# Patient Record
Sex: Female | Born: 1945
Health system: Southern US, Community
[De-identification: ages and names within clinical notes are randomized; demographics above are authoritative.]

## PROBLEM LIST (undated history)

## (undated) DIAGNOSIS — S8290XA Unspecified fracture of unspecified lower leg, initial encounter for closed fracture: Secondary | ICD-10-CM

## (undated) DIAGNOSIS — I10 Essential (primary) hypertension: Secondary | ICD-10-CM

## (undated) DIAGNOSIS — T7840XA Allergy, unspecified, initial encounter: Secondary | ICD-10-CM

## (undated) DIAGNOSIS — N189 Chronic kidney disease, unspecified: Secondary | ICD-10-CM

## (undated) DIAGNOSIS — R197 Diarrhea, unspecified: Secondary | ICD-10-CM

## (undated) DIAGNOSIS — C4491 Basal cell carcinoma of skin, unspecified: Secondary | ICD-10-CM

## (undated) DIAGNOSIS — E785 Hyperlipidemia, unspecified: Secondary | ICD-10-CM

## (undated) DIAGNOSIS — N393 Stress incontinence (female) (male): Secondary | ICD-10-CM

## (undated) DIAGNOSIS — I440 Atrioventricular block, first degree: Secondary | ICD-10-CM

## (undated) DIAGNOSIS — J189 Pneumonia, unspecified organism: Secondary | ICD-10-CM

## (undated) DIAGNOSIS — G709 Myoneural disorder, unspecified: Secondary | ICD-10-CM

## (undated) DIAGNOSIS — H269 Unspecified cataract: Secondary | ICD-10-CM

## (undated) DIAGNOSIS — J4520 Mild intermittent asthma, uncomplicated: Secondary | ICD-10-CM

## (undated) DIAGNOSIS — K219 Gastro-esophageal reflux disease without esophagitis: Secondary | ICD-10-CM

## (undated) DIAGNOSIS — Z87442 Personal history of urinary calculi: Secondary | ICD-10-CM

## (undated) DIAGNOSIS — E119 Type 2 diabetes mellitus without complications: Secondary | ICD-10-CM

## (undated) DIAGNOSIS — C439 Malignant melanoma of skin, unspecified: Secondary | ICD-10-CM

## (undated) DIAGNOSIS — M1990 Unspecified osteoarthritis, unspecified site: Secondary | ICD-10-CM

## (undated) HISTORY — PX: REPLACEMENT TOTAL HIP W/  RESURFACING IMPLANTS: SUR1222

## (undated) HISTORY — PX: POLYPECTOMY: SHX149

## (undated) HISTORY — DX: Allergy, unspecified, initial encounter: T78.40XA

## (undated) HISTORY — DX: Unspecified osteoarthritis, unspecified site: M19.90

## (undated) HISTORY — DX: Malignant melanoma of skin, unspecified: C43.9

## (undated) HISTORY — PX: HERNIA REPAIR: SHX51

## (undated) HISTORY — PX: JOINT REPLACEMENT: SHX530

## (undated) HISTORY — DX: Unspecified cataract: H26.9

## (undated) HISTORY — DX: Hyperlipidemia, unspecified: E78.5

## (undated) HISTORY — DX: Essential (primary) hypertension: I10

## (undated) HISTORY — PX: TONSILLECTOMY: SUR1361

## (undated) HISTORY — DX: Basal cell carcinoma of skin, unspecified: C44.91

## (undated) HISTORY — PX: COLONOSCOPY: SHX174

## (undated) HISTORY — DX: Unspecified fracture of unspecified lower leg, initial encounter for closed fracture: S82.90XA

## (undated) HISTORY — DX: Diarrhea, unspecified: R19.7

## (undated) HISTORY — DX: Myoneural disorder, unspecified: G70.9

## (undated) HISTORY — PX: KNEE ARTHROSCOPY: SUR90

---

## 1984-01-08 HISTORY — PX: OTHER SURGICAL HISTORY: SHX169

## 1985-01-07 HISTORY — PX: ABDOMINAL HYSTERECTOMY: SHX81

## 1987-01-08 HISTORY — PX: CHOLECYSTECTOMY: SHX55

## 2007-01-08 HISTORY — PX: BELPHAROPTOSIS REPAIR: SHX369

## 2010-04-27 LAB — HM MAMMOGRAPHY

## 2011-04-02 LAB — HEMOGLOBIN A1C: Hgb A1c MFr Bld: 6.3 % — AB (ref 4.0–6.0)

## 2011-05-08 LAB — HM DIABETES EYE EXAM

## 2011-09-30 LAB — BASIC METABOLIC PANEL
Creatinine: 0.7 mg/dL (ref 0.5–1.1)
Glucose: 97 mg/dL
Potassium: 4.5 mmol/L (ref 3.4–5.3)

## 2011-09-30 LAB — LIPID PANEL
Cholesterol: 170 mg/dL (ref 0–200)
Triglycerides: 222 mg/dL — AB (ref 40–160)

## 2011-09-30 LAB — CBC AND DIFFERENTIAL: HCT: 44 % (ref 36–46)

## 2011-09-30 LAB — HEPATIC FUNCTION PANEL: Alkaline Phosphatase: 65 U/L (ref 25–125)

## 2012-03-12 ENCOUNTER — Encounter: Payer: Self-pay | Admitting: *Deleted

## 2012-03-12 DIAGNOSIS — J45909 Unspecified asthma, uncomplicated: Secondary | ICD-10-CM

## 2012-03-12 DIAGNOSIS — E785 Hyperlipidemia, unspecified: Secondary | ICD-10-CM

## 2012-03-12 DIAGNOSIS — E1159 Type 2 diabetes mellitus with other circulatory complications: Secondary | ICD-10-CM | POA: Insufficient documentation

## 2012-03-12 DIAGNOSIS — I10 Essential (primary) hypertension: Secondary | ICD-10-CM

## 2012-03-12 DIAGNOSIS — E119 Type 2 diabetes mellitus without complications: Secondary | ICD-10-CM

## 2012-03-12 DIAGNOSIS — M1990 Unspecified osteoarthritis, unspecified site: Secondary | ICD-10-CM | POA: Insufficient documentation

## 2012-03-12 DIAGNOSIS — E1169 Type 2 diabetes mellitus with other specified complication: Secondary | ICD-10-CM | POA: Insufficient documentation

## 2012-04-07 ENCOUNTER — Other Ambulatory Visit: Payer: Self-pay

## 2012-04-07 MED ORDER — ALBUTEROL SULFATE HFA 108 (90 BASE) MCG/ACT IN AERS: 2.0000 | INHALATION_SPRAY | Freq: Four times a day (QID) | RESPIRATORY_TRACT | Status: DC | PRN

## 2012-04-13 ENCOUNTER — Ambulatory Visit (INDEPENDENT_AMBULATORY_CARE_PROVIDER_SITE_OTHER): Payer: Medicare Other | Admitting: Nurse Practitioner

## 2012-04-13 ENCOUNTER — Encounter: Payer: Self-pay | Admitting: Nurse Practitioner

## 2012-04-13 VITALS — BP 121/72 | HR 81 | Temp 97.2°F | Ht 65.0 in | Wt 199.5 lb

## 2012-04-13 DIAGNOSIS — E559 Vitamin D deficiency, unspecified: Secondary | ICD-10-CM

## 2012-04-13 DIAGNOSIS — M25559 Pain in unspecified hip: Secondary | ICD-10-CM

## 2012-04-13 DIAGNOSIS — I10 Essential (primary) hypertension: Secondary | ICD-10-CM

## 2012-04-13 DIAGNOSIS — E119 Type 2 diabetes mellitus without complications: Secondary | ICD-10-CM | POA: Diagnosis not present

## 2012-04-13 DIAGNOSIS — M25551 Pain in right hip: Secondary | ICD-10-CM

## 2012-04-13 LAB — BASIC METABOLIC PANEL
CO2: 27 mEq/L (ref 19–32)
Calcium: 9.5 mg/dL (ref 8.4–10.5)
Creat: 0.96 mg/dL (ref 0.50–1.10)

## 2012-04-13 LAB — HEPATIC FUNCTION PANEL
Bilirubin, Direct: 0.1 mg/dL (ref 0.0–0.3)
Indirect Bilirubin: 0.3 mg/dL (ref 0.0–0.9)
Total Protein: 6.7 g/dL (ref 6.0–8.3)

## 2012-04-13 LAB — POCT GLYCOSYLATED HEMOGLOBIN (HGB A1C): Hemoglobin A1C: 6.3

## 2012-04-13 MED ORDER — SULFAMETHOXAZOLE-TRIMETHOPRIM 800-160 MG PO TABS: 1.0000 | ORAL_TABLET | Freq: Two times a day (BID) | ORAL | Status: DC

## 2012-04-13 MED ORDER — DICLOFENAC SODIUM 75 MG PO TBEC: 75.0000 mg | DELAYED_RELEASE_TABLET | Freq: Two times a day (BID) | ORAL | Status: DC

## 2012-04-13 NOTE — Patient Instructions (Signed)
1. Diabetes Continue low carb diet - HgB A1c - Basic Metabolic Panel  2. HTN (hypertension) Low Na+ diet - Hepatic Function Panel - NMR Lipoprofile with Lipids  3. Unspecified vitamin D deficiency Continue Vitamin D OTC as discussed  4. Right hip pain Keep F/U with ortho. - diclofenac (VOLTAREN) 75 MG EC tablet; Take 1 tablet (75 mg total) by mouth 2 (two) times daily.  Dispense: 60 tablet; Refill: 3  5. Hydradenitis Bactrim as needed Shave downward and not upward Deodorant not antiperspirant  Mary-Margaret Daphine Deutscher, FNP

## 2012-04-13 NOTE — Progress Notes (Signed)
Subjective:    Patient ID: Rita Payne, female    DOB: 01/12/1945, 67 y.o.   MRN: [id number]  Hyperlipidemia This is a chronic problem. The current episode started more than 1 year ago. The problem is controlled. Recent lipid tests were reviewed and are normal. Exacerbating diseases include diabetes and obesity. There are no known factors aggravating her hyperlipidemia. Pertinent negatives include no chest pain, focal sensory loss, leg pain, myalgias or shortness of breath. Current antihyperlipidemic treatment includes statins. The current treatment provides significant improvement of lipids. There are no compliance problems.  Risk factors for coronary artery disease include diabetes mellitus and hypertension.  Hypertension This is a chronic problem. The current episode started more than 1 year ago. The problem is unchanged. The problem is controlled. Pertinent negatives include no blurred vision, chest pain, headaches, palpitations, peripheral edema or shortness of breath. There are no associated agents to hypertension. Risk factors for coronary artery disease include diabetes mellitus and dyslipidemia. Past treatments include angiotensin blockers. The current treatment provides significant improvement. There are no compliance problems.   Asthma There is no chest tightness, cough or shortness of breath. This is a chronic problem. The current episode started more than 1 year ago. Pertinent negatives include no chest pain, headaches, myalgias, rhinorrhea or weight loss. Her symptoms are aggravated by nothing. Her symptoms are alleviated by steroid inhaler (Only uses albuterol 1-2 X per month). She reports complete improvement on treatment. Her past medical history is significant for asthma.  Diabetes She presents for her follow-up diabetic visit. She has type 2 diabetes mellitus. No MedicAlert identification noted. Her disease course has been stable. There are no hypoglycemic associated symptoms. Pertinent  negatives for hypoglycemia include no headaches. Pertinent negatives for diabetes include no blurred vision, no chest pain, no polydipsia, no polyphagia, no polyuria, no visual change, no weakness and no weight loss. There are no hypoglycemic complications. Symptoms are stable. There are no diabetic complications. Risk factors for coronary artery disease include dyslipidemia, hypertension, obesity and post-menopausal. Current diabetic treatment includes diet. She is compliant with treatment all of the time. Her weight is stable. She is following a diabetic diet. When asked about meal planning, she reported none. She has not had a previous visit with a dietician. Exercise: Not able to exercise due to hip pain. Suppose to havehip replacement in JUne of this year. Her breakfast blood glucose is taken between 8-9 am. Her breakfast blood glucose range is generally 110-130 mg/dl. An ACE inhibitor/angiotensin II receptor blocker is contraindicated. She does not see a podiatrist.Eye exam is current (May 2013).  Hydradenitis This is a chronic problem. She keeps antibiotic on hand for flare ups. Is currently out. Last flare-up was 3 months ago.    Review of Systems  Constitutional: Negative for weight loss.  HENT: Negative for rhinorrhea.   Eyes: Negative for blurred vision.  Respiratory: Negative for cough and shortness of breath.   Cardiovascular: Negative for chest pain and palpitations.  Endocrine: Negative for polydipsia, polyphagia and polyuria.  Musculoskeletal: Negative for myalgias.  Neurological: Negative for weakness and headaches.  All other systems reviewed and are negative.       Objective:   Physical Exam  Constitutional: She is oriented to person, place, and time. She appears well-developed and well-nourished.  HENT:  Nose: Nose normal.  Mouth/Throat: Oropharynx is clear and moist.  Eyes: EOM are normal.  Neck: Trachea normal, normal range of motion and full passive range of motion  without pain. Neck supple. No  JVD present. Carotid bruit is not present. No thyromegaly present.  Cardiovascular: Normal rate, regular rhythm, normal heart sounds and intact distal pulses.  Exam reveals no gallop and no friction rub.   No murmur heard. Pulmonary/Chest: Effort normal and breath sounds normal.  Abdominal: Soft. Bowel sounds are normal. She exhibits no distension and no mass. There is no tenderness.  Musculoskeletal: Normal range of motion.  Lymphadenopathy:    She has no cervical adenopathy.  Neurological: She is alert and oriented to person, place, and time. She has normal reflexes.  (+) 4/4 monofilament bil feet  Skin: Skin is warm and dry.  No callus formation bil feet  Psychiatric: She has a normal mood and affect. Her behavior is normal. Judgment and thought content normal.   BP 121/72  Pulse 81  Temp(Src) 97.2 F (36.2 C) (Oral)  Ht 5\' 5"  (1.651 m)  Wt 199 lb 8 oz (90.493 kg)  BMI 33.2 kg/m2  LMP 04/14/1982 Results for orders placed in visit on 04/13/12  POCT GLYCOSYLATED HEMOGLOBIN (HGB A1C)      Result Value Range   Hemoglobin A1C 6.3     visit       Assessment & Plan:  1. Diabetes Continue low carb diet - HgB A1c - Basic Metabolic Panel  2. HTN (hypertension) Low Na+ diet - Hepatic Function Panel - NMR Lipoprofile with Lipids  3. Unspecified vitamin D deficiency Continue Vitamin D OTC as discussed  4. Right hip pain Keep F/U with ortho. - diclofenac (VOLTAREN) 75 MG EC tablet; Take 1 tablet (75 mg total) by mouth 2 (two) times daily.  Dispense: 60 tablet; Refill: 3  5. Hydradenitis Bactrim as needed Shave downward and not upward Deodorant not antiperspirant  Mary-Margaret Daphine Deutscher, FNP

## 2012-04-13 NOTE — Addendum Note (Signed)
Addended by: Bennie Pierini on: 04/13/2012 09:20 AM   Modules accepted: Orders

## 2012-04-14 ENCOUNTER — Telehealth: Payer: Self-pay | Admitting: *Deleted

## 2012-04-14 LAB — NMR LIPOPROFILE WITH LIPIDS
HDL Particle Number: 29.4 umol/L — ABNORMAL LOW (ref >=30.5)
HDL Size: 8.8 nm — ABNORMAL LOW (ref >=9.2)
HDL-C: 37 mg/dL — ABNORMAL LOW (ref >=40)
LDL (calc): 85 mg/dL (ref <100)
LDL Size: 19.8 nm — ABNORMAL LOW (ref >20.5)
LP-IR Score: 67 — ABNORMAL HIGH (ref <=45)

## 2012-04-14 NOTE — Telephone Encounter (Signed)
Message copied by Rami Budhu H on Tue Apr 14, 2012  3:30 PM ------      Message from: MARTIN, MARY-MARGARET      Created: Tue Apr 14, 2012  1:52 PM       All labs look ok. Mildly elevated LDL particle numbers. Continue all meds and recheck in 3 months ------ 

## 2012-04-14 NOTE — Telephone Encounter (Signed)
Message copied by Magdalene River on Tue Apr 14, 2012  3:29 PM ------      Message from: Bennie Pierini      Created: Tue Apr 14, 2012  1:52 PM       All labs look ok. Mildly elevated LDL particle numbers. Continue all meds and recheck in 3 months ------

## 2012-04-14 NOTE — Telephone Encounter (Signed)
Message copied by Magdalene River on Tue Apr 14, 2012  3:30 PM ------      Message from: Bennie Pierini      Created: Tue Apr 14, 2012  1:52 PM       All labs look ok. Mildly elevated LDL particle numbers. Continue all meds and recheck in 3 months ------

## 2012-04-15 ENCOUNTER — Telehealth: Payer: Self-pay | Admitting: Nurse Practitioner

## 2012-04-15 NOTE — Telephone Encounter (Signed)
Pt aware of results 

## 2012-05-20 ENCOUNTER — Other Ambulatory Visit: Payer: Self-pay

## 2012-05-20 DIAGNOSIS — M25551 Pain in right hip: Secondary | ICD-10-CM

## 2012-05-20 MED ORDER — ROSUVASTATIN CALCIUM 40 MG PO TABS: 40.0000 mg | ORAL_TABLET | Freq: Every day | ORAL | Status: DC

## 2012-05-20 MED ORDER — ALBUTEROL SULFATE HFA 108 (90 BASE) MCG/ACT IN AERS: 2.0000 | INHALATION_SPRAY | Freq: Four times a day (QID) | RESPIRATORY_TRACT | Status: DC | PRN

## 2012-05-20 MED ORDER — DICLOFENAC SODIUM 75 MG PO TBEC: 75.0000 mg | DELAYED_RELEASE_TABLET | Freq: Two times a day (BID) | ORAL | Status: DC

## 2012-05-20 MED ORDER — GLUCOSE BLOOD VI STRP: ORAL_STRIP | Status: DC

## 2012-05-20 MED ORDER — TELMISARTAN-HCTZ 80-25 MG PO TABS: 1.0000 | ORAL_TABLET | Freq: Every day | ORAL | Status: DC

## 2012-05-20 NOTE — Telephone Encounter (Signed)
Last seen 04/13/12 with labs  Print and have nurse call patient to pick up for mail order

## 2012-05-20 NOTE — Telephone Encounter (Signed)
rx ready for pickup 

## 2012-05-21 NOTE — Telephone Encounter (Signed)
Pt aware rx's up front and she will have to mail them in

## 2012-05-25 ENCOUNTER — Other Ambulatory Visit: Payer: Self-pay | Admitting: *Deleted

## 2012-05-25 MED ORDER — FLUTICASONE-SALMETEROL 250-50 MCG/DOSE IN AEPB: 1.0000 | INHALATION_SPRAY | Freq: Two times a day (BID) | RESPIRATORY_TRACT | Status: DC

## 2012-07-06 ENCOUNTER — Telehealth: Payer: Self-pay | Admitting: Nurse Practitioner

## 2012-07-06 NOTE — Telephone Encounter (Signed)
Patient advised to go to ER due to the pain radiating to her jaw for further examination to make sure it is not coming from her heart.

## 2012-07-14 ENCOUNTER — Encounter: Payer: Self-pay | Admitting: Nurse Practitioner

## 2012-07-14 ENCOUNTER — Ambulatory Visit (INDEPENDENT_AMBULATORY_CARE_PROVIDER_SITE_OTHER): Payer: Medicare Other | Admitting: Nurse Practitioner

## 2012-07-14 VITALS — BP 113/75 | HR 86 | Temp 97.0°F | Ht 65.0 in | Wt 192.0 lb

## 2012-07-14 DIAGNOSIS — I10 Essential (primary) hypertension: Secondary | ICD-10-CM | POA: Diagnosis not present

## 2012-07-14 DIAGNOSIS — M25551 Pain in right hip: Secondary | ICD-10-CM

## 2012-07-14 DIAGNOSIS — E119 Type 2 diabetes mellitus without complications: Secondary | ICD-10-CM

## 2012-07-14 DIAGNOSIS — E785 Hyperlipidemia, unspecified: Secondary | ICD-10-CM

## 2012-07-14 DIAGNOSIS — M25559 Pain in unspecified hip: Secondary | ICD-10-CM

## 2012-07-14 DIAGNOSIS — J45909 Unspecified asthma, uncomplicated: Secondary | ICD-10-CM

## 2012-07-14 DIAGNOSIS — J4520 Mild intermittent asthma, uncomplicated: Secondary | ICD-10-CM

## 2012-07-14 LAB — POCT UA - MICROALBUMIN: Microalbumin Ur, POC: NEGATIVE mg/L

## 2012-07-14 LAB — COMPLETE METABOLIC PANEL WITH GFR
Albumin: 4.1 g/dL (ref 3.5–5.2)
CO2: 26 mEq/L (ref 19–32)
Calcium: 9.5 mg/dL (ref 8.4–10.5)
GFR, Est African American: 72 mL/min
GFR, Est Non African American: 62 mL/min
Glucose, Bld: 95 mg/dL (ref 70–99)
Potassium: 4.2 mEq/L (ref 3.5–5.3)
Sodium: 139 mEq/L (ref 135–145)
Total Protein: 6.9 g/dL (ref 6.0–8.3)

## 2012-07-14 MED ORDER — ROSUVASTATIN CALCIUM 40 MG PO TABS: 40.0000 mg | ORAL_TABLET | Freq: Every day | ORAL | Status: DC

## 2012-07-14 MED ORDER — DICLOFENAC SODIUM 1 % TD GEL: 4.0000 g | Freq: Four times a day (QID) | TRANSDERMAL | Status: DC

## 2012-07-14 MED ORDER — FLUTICASONE-SALMETEROL 250-50 MCG/DOSE IN AEPB: 1.0000 | INHALATION_SPRAY | Freq: Two times a day (BID) | RESPIRATORY_TRACT | Status: DC

## 2012-07-14 MED ORDER — PNEUMOCOCCAL VAC POLYVALENT 25 MCG/0.5ML IJ INJ: 0.5000 mL | INJECTION | INTRAMUSCULAR | Status: AC

## 2012-07-14 MED ORDER — ALBUTEROL SULFATE HFA 108 (90 BASE) MCG/ACT IN AERS: 2.0000 | INHALATION_SPRAY | Freq: Four times a day (QID) | RESPIRATORY_TRACT | Status: DC | PRN

## 2012-07-14 MED ORDER — DICLOFENAC SODIUM 75 MG PO TBEC: 75.0000 mg | DELAYED_RELEASE_TABLET | Freq: Two times a day (BID) | ORAL | Status: DC

## 2012-07-14 MED ORDER — TELMISARTAN-HCTZ 80-25 MG PO TABS: 1.0000 | ORAL_TABLET | Freq: Every day | ORAL | Status: DC

## 2012-07-14 NOTE — Addendum Note (Signed)
Addended by: Bernita Buffy on: 07/14/2012 10:25 AM   Modules accepted: Orders

## 2012-07-14 NOTE — Patient Instructions (Addendum)
Smoking Cessation Quitting smoking is important to your health and has many advantages. However, it is not always easy to quit since nicotine is a very addictive drug. Often times, people try 3 times or more before being able to quit. This document explains the best ways for you to prepare to quit smoking. Quitting takes hard work and a lot of effort, but you can do it. ADVANTAGES OF QUITTING SMOKING  You will live longer, feel better, and live better.  Your body will feel the impact of quitting smoking almost immediately.  Within 20 minutes, blood pressure decreases. Your pulse returns to its normal level.  After 8 hours, carbon monoxide levels in the blood return to normal. Your oxygen level increases.  After 24 hours, the chance of having a heart attack starts to decrease. Your breath, hair, and body stop smelling like smoke.  After 48 hours, damaged nerve endings begin to recover. Your sense of taste and smell improve.  After 72 hours, the body is virtually free of nicotine. Your bronchial tubes relax and breathing becomes easier.  After 2 to 12 weeks, lungs can hold more air. Exercise becomes easier and circulation improves.  The risk of having a heart attack, stroke, cancer, or lung disease is greatly reduced.  After 1 year, the risk of coronary heart disease is cut in half.  After 5 years, the risk of stroke falls to the same as a nonsmoker.  After 10 years, the risk of lung cancer is cut in half and the risk of other cancers decreases significantly.  After 15 years, the risk of coronary heart disease drops, usually to the level of a nonsmoker.  If you are pregnant, quitting smoking will improve your chances of having a healthy baby.  The people you live with, especially any children, will be healthier.  You will have extra money to spend on things other than cigarettes. QUESTIONS TO THINK ABOUT BEFORE ATTEMPTING TO QUIT You may want to talk about your answers with your  caregiver.  Why do you want to quit?  If you tried to quit in the past, what helped and what did not?  What will be the most difficult situations for you after you quit? How will you plan to handle them?  Who can help you through the tough times? Your family? Friends? A caregiver?  What pleasures do you get from smoking? What ways can you still get pleasure if you quit? Here are some questions to ask your caregiver:  How can you help me to be successful at quitting?  What medicine do you think would be best for me and how should I take it?  What should I do if I need more help?  What is smoking withdrawal like? How can I get information on withdrawal? GET READY  Set a quit date.  Change your environment by getting rid of all cigarettes, ashtrays, matches, and lighters in your home, car, or work. Do not let people smoke in your home.  Review your past attempts to quit. Think about what worked and what did not. GET SUPPORT AND ENCOURAGEMENT You have a better chance of being successful if you have help. You can get support in many ways.  Tell your family, friends, and co-workers that you are going to quit and need their support. Ask them not to smoke around you.  Get individual, group, or telephone counseling and support. Programs are available at local hospitals and health centers. Call your local health department for   information about programs in your area.  Spiritual beliefs and practices may help some smokers quit.  Download a "quit meter" on your computer to keep track of quit statistics, such as how long you have gone without smoking, cigarettes not smoked, and money saved.  Get a self-help book about quitting smoking and staying off of tobacco. LEARN NEW SKILLS AND BEHAVIORS  Distract yourself from urges to smoke. Talk to someone, go for a walk, or occupy your time with a task.  Change your normal routine. Take a different route to work. Drink tea instead of coffee.  Eat breakfast in a different place.  Reduce your stress. Take a hot bath, exercise, or read a book.  Plan something enjoyable to do every day. Reward yourself for not smoking.  Explore interactive web-based programs that specialize in helping you quit. GET MEDICINE AND USE IT CORRECTLY Medicines can help you stop smoking and decrease the urge to smoke. Combining medicine with the above behavioral methods and support can greatly increase your chances of successfully quitting smoking.  Nicotine replacement therapy helps deliver nicotine to your body without the negative effects and risks of smoking. Nicotine replacement therapy includes nicotine gum, lozenges, inhalers, nasal sprays, and skin patches. Some may be available over-the-counter and others require a prescription.  Antidepressant medicine helps people abstain from smoking, but how this works is unknown. This medicine is available by prescription.  Nicotinic receptor partial agonist medicine simulates the effect of nicotine in your brain. This medicine is available by prescription. Ask your caregiver for advice about which medicines to use and how to use them based on your health history. Your caregiver will tell you what side effects to look out for if you choose to be on a medicine or therapy. Carefully read the information on the package. Do not use any other product containing nicotine while using a nicotine replacement product.  RELAPSE OR DIFFICULT SITUATIONS Most relapses occur within the first 3 months after quitting. Do not be discouraged if you start smoking again. Remember, most people try several times before finally quitting. You may have symptoms of withdrawal because your body is used to nicotine. You may crave cigarettes, be irritable, feel very hungry, cough often, get headaches, or have difficulty concentrating. The withdrawal symptoms are only temporary. They are strongest when you first quit, but they will go away within  10 14 days. To reduce the chances of relapse, try to:  Avoid drinking alcohol. Drinking lowers your chances of successfully quitting.  Reduce the amount of caffeine you consume. Once you quit smoking, the amount of caffeine in your body increases and can give you symptoms, such as a rapid heartbeat, sweating, and anxiety.  Avoid smokers because they can make you want to smoke.  Do not let weight gain distract you. Many smokers will gain weight when they quit, usually less than 10 pounds. Eat a healthy diet and stay active. You can always lose the weight gained after you quit.  Find ways to improve your mood other than smoking. FOR MORE INFORMATION  www.smokefree.gov  Document Released: 12/18/2000 Document Revised: 06/25/2011 Document Reviewed: 04/04/2011 [employer] Patient Information 2014 [employer].  

## 2012-07-14 NOTE — Progress Notes (Signed)
Subjective:    Patient ID: Rita Payne, female    DOB: 04-Nov-1945, 67 y.o.   MRN: [id number]  Hyperlipidemia This is a chronic problem. The current episode started more than 1 year ago. The problem is uncontrolled. Recent lipid tests were reviewed and are high. Exacerbating diseases include diabetes. She has no history of hypothyroidism. Factors aggravating her hyperlipidemia include smoking. Pertinent negatives include no myalgias or shortness of breath. Current antihyperlipidemic treatment includes statins. The current treatment provides mild improvement of lipids. Risk factors for coronary artery disease include post-menopausal, hypertension, diabetes mellitus and dyslipidemia.  Hypertension This is a chronic problem. The current episode started more than 1 year ago. The problem has been resolved since onset. The problem is controlled. Pertinent negatives include no anxiety, peripheral edema or shortness of breath. Risk factors for coronary artery disease include diabetes mellitus, family history, dyslipidemia, post-menopausal state and smoking/tobacco exposure. Past treatments include diuretics and angiotensin blockers. The current treatment provides moderate improvement. There is no history of a thyroid problem.  Diabetes She presents for her follow-up diabetic visit. She has type 2 diabetes mellitus. There are no hypoglycemic associated symptoms. Pertinent negatives for diabetes include no foot paresthesias, no foot ulcerations and no visual change. Symptoms are stable. Risk factors for coronary artery disease include diabetes mellitus, dyslipidemia, family history, hypertension and tobacco exposure. Current diabetic treatment includes diet. She is compliant with treatment all of the time. Her weight is stable. She is following a diabetic diet. She never participates in exercise. Her breakfast blood glucose range is generally 90-110 mg/dl. An ACE inhibitor/angiotensin II receptor blocker is not being  taken. She does not see a podiatrist.Eye exam is current (A year ago).  Asthma There is no chest tightness, cough, difficulty breathing or shortness of breath. This is a chronic problem. The current episode started more than 1 year ago. The problem occurs intermittently. The problem has been waxing and waning. Pertinent negatives include no myalgias. Her symptoms are aggravated by change in weather, exercise and pollen. Her symptoms are alleviated by steroid inhaler. She reports significant improvement on treatment. Risk factors for lung disease include smoking/tobacco exposure. Her past medical history is significant for asthma.      Review of Systems  Respiratory: Negative for cough and shortness of breath.   Musculoskeletal: Negative for myalgias.  All other systems reviewed and are negative.       Objective:   Physical Exam  Constitutional: She is oriented to person, place, and time. She appears well-developed and well-nourished.  HENT:  Head: Normocephalic.  Right Ear: External ear normal.  Left Ear: External ear normal.  Eyes: Pupils are equal, round, and reactive to light.  Neck: Normal range of motion. Neck supple. No thyromegaly present.  Cardiovascular: Normal rate, regular rhythm, normal heart sounds and intact distal pulses.   Pulmonary/Chest: Effort normal and breath sounds normal.  Abdominal: Soft. Bowel sounds are normal. There is no tenderness. There is no rebound.  Musculoskeletal: She exhibits no edema.  Decreased ROM Right hip due to pain. Has consult with ortho July 28  Neurological: She is alert and oriented to person, place, and time. She has normal reflexes.  Skin: Skin is warm and dry.  Psychiatric: She has a normal mood and affect. Her behavior is normal. Judgment and thought content normal.   BP 113/75  Pulse 86  Temp(Src) 97 F (36.1 C) (Oral)  Ht 5\' 5"  (1.651 m)  Wt 192 lb (87.091 kg)  BMI 31.95 kg/m2  LMP  04/14/1982 Results for orders placed in  visit on 07/14/12  POCT GLYCOSYLATED HEMOGLOBIN (HGB A1C)      Result Value Range   Hemoglobin A1C 5.9%            Assessment & Plan:   1. Diabetes mellitus, controlled   2. Hyperlipidemia   3. Hypertension   4. Right hip pain   5. Asthma, chronic, mild intermittent, uncomplicated    Orders Placed This Encounter  Procedures  . COMPLETE METABOLIC PANEL WITH GFR  . NMR Lipoprofile with Lipids  . POCT glycosylated hemoglobin (Hb A1C)   Meds ordered this encounter  Medications  . albuterol (PROVENTIL HFA;VENTOLIN HFA) 108 (90 BASE) MCG/ACT inhaler    Sig: Inhale 2 puffs into the lungs every 6 (six) hours as needed for wheezing.    Dispense:  3 Inhaler    Refill:  1    Order Specific Question:  Supervising Provider    Answer:  Ernestina Penna [[id number]]  . diclofenac (VOLTAREN) 75 MG EC tablet    Sig: Take 1 tablet (75 mg total) by mouth 2 (two) times daily.    Dispense:  180 tablet    Refill:  1    Order Specific Question:  Supervising Provider    Answer:  Ernestina Penna [[id number]]  . diclofenac sodium (VOLTAREN) 1 % GEL    Sig: Apply 4 g topically 4 (four) times daily.    Dispense:  500 g    Refill:  2    Order Specific Question:  Supervising Provider    Answer:  Ernestina Penna [[id number]]  . Fluticasone-Salmeterol (ADVAIR) 250-50 MCG/DOSE AEPB    Sig: Inhale 1 puff into the lungs every 12 (twelve) hours.    Dispense:  180 each    Refill:  1    Order Specific Question:  Supervising Provider    Answer:  Ernestina Penna [[id number]]  . rosuvastatin (CRESTOR) 40 MG tablet    Sig: Take 1 tablet (40 mg total) by mouth daily.    Dispense:  90 tablet    Refill:  1    Order Specific Question:  Supervising Provider    Answer:  Ernestina Penna [[id number]]  . telmisartan-hydrochlorothiazide (MICARDIS HCT) 80-25 MG per tablet    Sig: Take 1 tablet by mouth daily.    Dispense:  90 tablet    Refill:  1    Order Specific Question:  Supervising Provider    Answer:  Deborra Medina    Continue current meds Labs pending Diet and exercsie encouraged Follow up in 3 months  Mary-Margaret Daphine Deutscher, FNP

## 2012-07-14 NOTE — Addendum Note (Signed)
Addended by: Bennie Pierini on: 07/14/2012 10:17 AM   Modules accepted: Orders

## 2012-07-15 LAB — NMR LIPOPROFILE WITH LIPIDS
HDL Size: 8.6 nm — ABNORMAL LOW (ref >=9.2)
LDL Size: 19.6 nm — ABNORMAL LOW (ref >20.5)
Large HDL-P: <1.3 umol/L — ABNORMAL LOW (ref >=4.8)
Large VLDL-P: 10.4 nmol/L — ABNORMAL HIGH (ref <=2.7)
Small LDL Particle Number: >2300 nmol/L — ABNORMAL HIGH (ref <=527)

## 2012-08-03 DIAGNOSIS — M169 Osteoarthritis of hip, unspecified: Secondary | ICD-10-CM | POA: Diagnosis not present

## 2012-08-04 ENCOUNTER — Telehealth: Payer: Self-pay | Admitting: Nurse Practitioner

## 2012-08-11 ENCOUNTER — Encounter (HOSPITAL_COMMUNITY): Payer: Self-pay | Admitting: Pharmacy Technician

## 2012-08-12 ENCOUNTER — Telehealth: Payer: Self-pay | Admitting: Nurse Practitioner

## 2012-08-13 ENCOUNTER — Telehealth: Payer: Self-pay | Admitting: Nurse Practitioner

## 2012-08-13 NOTE — Telephone Encounter (Signed)
Call is being handled in another encounter by triage for appt

## 2012-08-13 NOTE — Telephone Encounter (Signed)
Call is being handled by triage in another encounter

## 2012-08-14 ENCOUNTER — Encounter (HOSPITAL_COMMUNITY): Payer: Self-pay

## 2012-08-14 ENCOUNTER — Ambulatory Visit (HOSPITAL_COMMUNITY)
Admission: RE | Admit: 2012-08-14 | Discharge: 2012-08-14 | Disposition: A | Discharge status: Home / Self Care | Payer: Medicare Other | Source: Ambulatory Visit | Attending: Orthopedic Surgery | Admitting: Orthopedic Surgery

## 2012-08-14 ENCOUNTER — Encounter (HOSPITAL_COMMUNITY)
Admission: RE | Admit: 2012-08-14 | Discharge: 2012-08-14 | Disposition: A | Discharge status: Home / Self Care | Payer: Medicare Other | Source: Ambulatory Visit | Attending: Orthopedic Surgery | Admitting: Orthopedic Surgery

## 2012-08-14 DIAGNOSIS — Z01818 Encounter for other preprocedural examination: Secondary | ICD-10-CM | POA: Insufficient documentation

## 2012-08-14 DIAGNOSIS — I10 Essential (primary) hypertension: Secondary | ICD-10-CM | POA: Diagnosis not present

## 2012-08-14 DIAGNOSIS — Z01812 Encounter for preprocedural laboratory examination: Secondary | ICD-10-CM | POA: Diagnosis not present

## 2012-08-14 DIAGNOSIS — M169 Osteoarthritis of hip, unspecified: Secondary | ICD-10-CM | POA: Diagnosis not present

## 2012-08-14 HISTORY — DX: Pneumonia, unspecified organism: J18.9

## 2012-08-14 LAB — BASIC METABOLIC PANEL
CO2: 27 mEq/L (ref 19–32)
Chloride: 98 mEq/L (ref 96–112)
GFR calc Af Amer: 83 mL/min — ABNORMAL LOW (ref >90)
Potassium: 3.5 mEq/L (ref 3.5–5.1)
Sodium: 138 mEq/L (ref 135–145)

## 2012-08-14 LAB — URINALYSIS, ROUTINE W REFLEX MICROSCOPIC
Glucose, UA: NEGATIVE mg/dL
Nitrite: NEGATIVE
Protein, ur: NEGATIVE mg/dL
pH: 5.5 (ref 5.0–8.0)

## 2012-08-14 LAB — PROTIME-INR: INR: 0.92 (ref 0.00–1.49)

## 2012-08-14 LAB — CBC
HCT: 39.8 % (ref 36.0–46.0)
MCV: 92.6 fL (ref 78.0–100.0)
RBC: 4.3 MIL/uL (ref 3.87–5.11)
WBC: 9.9 10*3/uL (ref 4.0–10.5)

## 2012-08-14 LAB — URINE MICROSCOPIC-ADD ON

## 2012-08-14 LAB — SURGICAL PCR SCREEN: MRSA, PCR: NEGATIVE

## 2012-08-14 LAB — APTT: aPTT: 27 seconds (ref 24–37)

## 2012-08-14 NOTE — Telephone Encounter (Signed)
appt given for 8/14 with Paulene Floor

## 2012-08-14 NOTE — Patient Instructions (Addendum)
Wilmetta Speiser  08/14/2012   Your procedure is scheduled on:  08/25/12               Surgery 1240-210pm  Report to [facility] at     0940 AM.  Call this number if you have problems the morning of surgery: [phone]   Remember:   Do not eat food or drink liquids after midnight.   Take these medicines the morning of surgery with A SIP OF WATER:    Do not wear jewelry, make-up or nail polish.  Do not wear lotions, powders, or perfumes.   Do not shave 48 hours prior to surgery.   Do not bring valuables to the hospital.  Contacts, dentures or bridgework may not be worn into surgery.  Leave suitcase in the car. After surgery it may be brought to your room.  For patients admitted to the hospital, checkout time is 11:00 AM the day of  discharge.      SEE CHG INSTRUCTION SHEET    Please read over the following fact sheets that you were given: MRSA Information, coughing and deep breathing exercises, leg exercises, Blood Transfusion Fact sheet, Incentive Spirometry Fact sheet               Failure to comply with these instructions may result in cancellation of your surgery.                Patient Signature ____________________________              Nurse Signature _____________________________  Amada Kingfisher  08/17/2012   Your procedure is scheduled on:    Report to [facility] at  AM.  Call this number if you have problems the morning of surgery: [phone]   Remember:   Do not eat food or drink liquids after midnight.   Take these medicines the morning of surgery with A SIP OF WATER:    Do not wear jewelry, make-up or nail polish.  Do not wear lotions, powders, or perfumes. You may wear deodorant.  Do not shave 48 hours prior to surgery. Men may shave face and neck.  Do not bring valuables to the hospital.  Contacts, dentures or bridgework may not be worn into surgery.  Leave suitcase in the car. After surgery it may be brought to your room.  For  patients admitted to the hospital, checkout time is 11:00 AM the day of  discharge.   Patients discharged the day of surgery will not be allowed to drive  home.  Name and phone number of your driver:   SEE CHG INSTRUCTION SHEET    Please read over the following fact sheets that you were given: MRSA Information, coughing and deep breathing exercises, leg exercises               Failure to comply with these instructions may result in cancellation of your surgery.                Patient Signature ____________________________              Nurse Signature _____________________________

## 2012-08-15 LAB — URINE CULTURE: Colony Count: 40000

## 2012-08-16 NOTE — H&P (Signed)
TOTAL HIP ADMISSION H&P  Patient is admitted for right total hip arthroplasty, anterior approach.  Subjective:  Chief Complaint: Right hip OA / pain  HPI: Rita Payne, 67 y.o. female, has a history of pain and functional disability in the right hip(s) due to trauma and patient has failed non-surgical conservative treatments for greater than 12 weeks to include NSAID's and/or analgesics, use of assistive devices and activity modification.  Onset of symptoms was gradual starting 5 years ago with rapidlly worsening course since that time.The patient noted no past surgery on the right hip(s).  Patient currently rates pain in the right hip at 10 out of 10 with activity. Patient has night pain, worsening of pain with activity and weight bearing, trendelenberg gait, pain that interfers with activities of daily living and pain with passive range of motion. Patient has evidence of periarticular osteophytes and joint space narrowing by imaging studies. This condition presents safety issues increasing the risk of falls.  There is no current active infection.  Risks, benefits and expectations were discussed with the patient. Patient understand the risks, benefits and expectations and wishes to proceed with surgery.   D/C Plans:   Home with HHPT  Post-op Meds:   Rx given for ASA, Robaxin, Celebrex, Iron, Colace and MiraLax  Tranexamic Acid:   To be given  Decadron:    Not to be given - DM  FYI:    ASA post-op   Patient Active Problem List   Diagnosis Date Noted  . Diabetes 03/12/2012  . Asthma, chronic 03/12/2012  . Essential hypertension, benign 03/12/2012  . Other and unspecified hyperlipidemia 03/12/2012  . Arthritis 03/12/2012   Past Medical History  Diagnosis Date  . Asthma   . Hypertension   . Hyperlipidemia   . Arthritis   . Pneumonia     hx of   . Diabetes mellitus without complication     type II not on meds 5.9 AIC on 07/14/12   . Sleep apnea     mild sleep apnea 10 years ago not on  CPAP   . Kidney stones     hx of   . UTI (urinary tract infection)     hx of     Past Surgical History  Procedure Laterality Date  . Tonsillectomy    . Abdominal hysterectomy    . Cholecystectomy    . Knee arthroscopy      bilateral knees  . Hernia repair    . Kidney tumor removal       1986  . Belpharoptosis repair      bilateral - 2009      Allergies  Allergen Reactions  . Adhesive (Tape)     blisters  . Iodine Other (See Comments)    Iodine that is applied to skin causes blisters   . Ivp Dye (Iodinated Diagnostic Agents)     unknown  . Pyridium (Phenazopyridine Hcl)     unknown  . Shellfish Allergy     unknown  . Vasotec (Enalapril)     unknown    History  Substance Use Topics  . Smoking status: Current Every Day Smoker -- 0.25 packs/day for 6 years    Types: Cigarettes  . Smokeless tobacco: Never Used  . Alcohol Use: No    Family History  Problem Relation Age of Onset  . Diabetes Mother   . Heart disease Mother   . Stroke Mother   . Heart disease Father   . Stroke Father   .  Vision loss Brother   . Diabetes Sister   . Diabetes Sister   . Stroke Sister   . Heart disease Brother      Review of Systems  Constitutional: Negative.   HENT: Negative.   Eyes: Negative.   Respiratory: Negative.   Cardiovascular: Negative.   Musculoskeletal: Positive for myalgias, back pain and joint pain.  Skin: Negative.   Endo/Heme/Allergies: Positive for environmental allergies.    Objective:  Physical Exam  Constitutional: She is oriented to person, place, and time. She appears well-developed and well-nourished.  HENT:  Head: Normocephalic and atraumatic.  Mouth/Throat: Oropharynx is clear and moist. She has dentures.  Eyes: Pupils are equal, round, and reactive to light.  Neck: Neck supple. No JVD present. No tracheal deviation present. No thyromegaly present.  Cardiovascular: Normal rate, regular rhythm, normal heart sounds and intact distal pulses.    Respiratory: Effort normal and breath sounds normal. No stridor. No respiratory distress. She has no wheezes.  GI: Soft. There is no tenderness. There is no guarding.  Musculoskeletal:       Right hip: She exhibits decreased range of motion, decreased strength, tenderness and bony tenderness. She exhibits no swelling, no deformity and no laceration.  Lymphadenopathy:    She has no cervical adenopathy.  Neurological: She is alert and oriented to person, place, and time.  Skin: Skin is warm and dry.  Psychiatric: She has a normal mood and affect.     Labs:  Estimated body mass index is 31.95 kg/(m^2) as calculated from the following:   Height as of 07/14/12: 5\' 5"  (1.651 m).   Weight as of 07/14/12: 87.091 kg (192 lb).   Imaging Review Plain radiographs demonstrate severe degenerative joint disease of the right hip(s). The bone quality appears to be good for age and reported activity level.  Assessment/Plan:  End stage arthritis, right hip(s)  The patient history, physical examination, clinical judgement of the provider and imaging studies are consistent with end stage degenerative joint disease of the right hip(s) and total hip arthroplasty is deemed medically necessary. The treatment options including medical management, injection therapy, arthroscopy and arthroplasty were discussed at length. The risks and benefits of total hip arthroplasty were presented and reviewed. The risks due to aseptic loosening, infection, stiffness, dislocation/subluxation,  thromboembolic complications and other imponderables were discussed.  The patient acknowledged the explanation, agreed to proceed with the plan and consent was signed. Patient is being admitted for inpatient treatment for surgery, pain control, PT, OT, prophylactic antibiotics, VTE prophylaxis, progressive ambulation and ADL's and discharge planning.The patient is planning to be discharged home with home health services.    Rita Payne  Rita Payne   PAC  08/16/2012, 3:40 PM

## 2012-08-20 ENCOUNTER — Encounter: Payer: Medicare Other | Admitting: Nurse Practitioner

## 2012-08-20 NOTE — Progress Notes (Signed)
Called and left patient a message regarding time change on surgery to 0955am-1125am.  Arrival time of 0700am.  Also nothing to eat or drink after midnite. Asked patient to return call to know she received message.

## 2012-08-20 NOTE — Progress Notes (Signed)
On 08/18/12 requested most recent EKG from Dr Rudi Heap [facility] , [place]. [phone].  On 08/19/12 requested most recent EKG from Dr Rudi Heap office in [place] , [place]  [phone].   On 08/20/12 received clearance from Bennie Pierini NP and placed on chart.  Requested most recent KEG and office visit note from office .  [phone].

## 2012-08-20 NOTE — Progress Notes (Signed)
This encounter was created in error - please disregard.

## 2012-08-25 ENCOUNTER — Inpatient Hospital Stay (HOSPITAL_COMMUNITY): Payer: Medicare Other

## 2012-08-25 ENCOUNTER — Encounter (HOSPITAL_COMMUNITY): Payer: Self-pay | Admitting: *Deleted

## 2012-08-25 ENCOUNTER — Encounter (HOSPITAL_COMMUNITY): Payer: Self-pay | Admitting: Anesthesiology

## 2012-08-25 ENCOUNTER — Inpatient Hospital Stay (HOSPITAL_COMMUNITY): Payer: Medicare Other | Admitting: Anesthesiology

## 2012-08-25 ENCOUNTER — Encounter (HOSPITAL_COMMUNITY)
Admission: RE | Disposition: A | Discharge status: Home Health | Payer: Self-pay | Source: Ambulatory Visit | Attending: Orthopedic Surgery

## 2012-08-25 ENCOUNTER — Inpatient Hospital Stay (HOSPITAL_COMMUNITY)
Admission: RE | Admit: 2012-08-25 | Discharge: 2012-08-26 | DRG: 470 | Disposition: A | Discharge status: Home Health | Payer: Medicare Other | Source: Ambulatory Visit | Attending: Orthopedic Surgery | Admitting: Orthopedic Surgery

## 2012-08-25 DIAGNOSIS — Z96649 Presence of unspecified artificial hip joint: Secondary | ICD-10-CM

## 2012-08-25 DIAGNOSIS — E669 Obesity, unspecified: Secondary | ICD-10-CM | POA: Diagnosis not present

## 2012-08-25 DIAGNOSIS — E119 Type 2 diabetes mellitus without complications: Secondary | ICD-10-CM | POA: Diagnosis not present

## 2012-08-25 DIAGNOSIS — M1610 Unilateral primary osteoarthritis, unspecified hip: Principal | ICD-10-CM | POA: Diagnosis present

## 2012-08-25 DIAGNOSIS — F17200 Nicotine dependence, unspecified, uncomplicated: Secondary | ICD-10-CM | POA: Diagnosis present

## 2012-08-25 DIAGNOSIS — J45909 Unspecified asthma, uncomplicated: Secondary | ICD-10-CM | POA: Diagnosis present

## 2012-08-25 DIAGNOSIS — D500 Iron deficiency anemia secondary to blood loss (chronic): Secondary | ICD-10-CM | POA: Diagnosis not present

## 2012-08-25 DIAGNOSIS — G4730 Sleep apnea, unspecified: Secondary | ICD-10-CM | POA: Diagnosis present

## 2012-08-25 DIAGNOSIS — M169 Osteoarthritis of hip, unspecified: Secondary | ICD-10-CM | POA: Diagnosis not present

## 2012-08-25 DIAGNOSIS — M25559 Pain in unspecified hip: Secondary | ICD-10-CM | POA: Diagnosis not present

## 2012-08-25 DIAGNOSIS — Z471 Aftercare following joint replacement surgery: Secondary | ICD-10-CM | POA: Diagnosis not present

## 2012-08-25 DIAGNOSIS — D62 Acute posthemorrhagic anemia: Secondary | ICD-10-CM | POA: Diagnosis not present

## 2012-08-25 DIAGNOSIS — I10 Essential (primary) hypertension: Secondary | ICD-10-CM | POA: Diagnosis present

## 2012-08-25 DIAGNOSIS — E785 Hyperlipidemia, unspecified: Secondary | ICD-10-CM | POA: Diagnosis present

## 2012-08-25 HISTORY — PX: TOTAL HIP ARTHROPLASTY: SHX124

## 2012-08-25 LAB — TYPE AND SCREEN

## 2012-08-25 LAB — GLUCOSE, CAPILLARY: Glucose-Capillary: 113 mg/dL — ABNORMAL HIGH (ref 70–99)

## 2012-08-25 LAB — ABO/RH: ABO/RH(D): A POS

## 2012-08-25 SURGERY — ARTHROPLASTY, HIP, TOTAL, ANTERIOR APPROACH
Anesthesia: General | Site: Hip | Laterality: Right | Wound class: Clean

## 2012-08-25 MED ORDER — CEFAZOLIN SODIUM-DEXTROSE 2-3 GM-% IV SOLR
2.0000 g | INTRAVENOUS | Status: AC
  Administered 2012-08-25: 2 g via INTRAVENOUS

## 2012-08-25 MED ORDER — METOCLOPRAMIDE HCL 5 MG PO TABS
5.0000 mg | ORAL_TABLET | Freq: Three times a day (TID) | ORAL | Status: DC | PRN
  Filled 2012-08-25: qty 2

## 2012-08-25 MED ORDER — FERROUS SULFATE 325 (65 FE) MG PO TABS
325.0000 mg | ORAL_TABLET | Freq: Three times a day (TID) | ORAL | Status: DC
  Administered 2012-08-26: 325 mg via ORAL
  Filled 2012-08-25 (×5): qty 1

## 2012-08-25 MED ORDER — HYDROMORPHONE HCL PF 1 MG/ML IJ SOLN
0.2500 mg | INTRAMUSCULAR | Status: DC | PRN
  Administered 2012-08-25 (×2): 0.5 mg via INTRAVENOUS

## 2012-08-25 MED ORDER — METOCLOPRAMIDE HCL 5 MG/ML IJ SOLN: 5.0000 mg | Freq: Three times a day (TID) | INTRAMUSCULAR | Status: DC | PRN

## 2012-08-25 MED ORDER — NEOSTIGMINE METHYLSULFATE 1 MG/ML IJ SOLN
INTRAMUSCULAR | Status: DC | PRN
  Administered 2012-08-25: 5 mg via INTRAVENOUS

## 2012-08-25 MED ORDER — TELMISARTAN 80 MG PO TABS
80.0000 mg | ORAL_TABLET | Freq: Every day | ORAL | Status: DC
  Administered 2012-08-25 – 2012-08-26 (×2): 80 mg via ORAL
  Filled 2012-08-25 (×2): qty 1

## 2012-08-25 MED ORDER — BISACODYL 10 MG RE SUPP: 10.0000 mg | Freq: Every day | RECTAL | Status: DC | PRN

## 2012-08-25 MED ORDER — DIPHENHYDRAMINE HCL 25 MG PO CAPS: 25.0000 mg | ORAL_CAPSULE | Freq: Four times a day (QID) | ORAL | Status: DC | PRN

## 2012-08-25 MED ORDER — CEFAZOLIN SODIUM-DEXTROSE 2-3 GM-% IV SOLR
2.0000 g | Freq: Four times a day (QID) | INTRAVENOUS | Status: AC
  Administered 2012-08-25 (×2): 2 g via INTRAVENOUS
  Filled 2012-08-25 (×2): qty 50

## 2012-08-25 MED ORDER — HYDROMORPHONE HCL PF 1 MG/ML IJ SOLN
INTRAMUSCULAR | Status: DC | PRN
  Administered 2012-08-25: 2 mg via INTRAVENOUS
  Administered 2012-08-25 (×2): 1 mg via INTRAVENOUS

## 2012-08-25 MED ORDER — LACTATED RINGERS IV SOLN
INTRAVENOUS | Status: DC
  Administered 2012-08-25: 1000 mL via INTRAVENOUS
  Administered 2012-08-25 (×2): via INTRAVENOUS

## 2012-08-25 MED ORDER — MIDAZOLAM HCL 5 MG/5ML IJ SOLN
INTRAMUSCULAR | Status: DC | PRN
  Administered 2012-08-25 (×2): 1 mg via INTRAVENOUS

## 2012-08-25 MED ORDER — LACTATED RINGERS IV SOLN: INTRAVENOUS | Status: DC

## 2012-08-25 MED ORDER — NICOTINE 21 MG/24HR TD PT24
21.0000 mg | MEDICATED_PATCH | Freq: Every day | TRANSDERMAL | Status: DC
  Administered 2012-08-25: 21 mg via TRANSDERMAL
  Filled 2012-08-25 (×2): qty 1

## 2012-08-25 MED ORDER — GLYCOPYRROLATE 0.2 MG/ML IJ SOLN
INTRAMUSCULAR | Status: DC | PRN
  Administered 2012-08-25: 0.6 mg via INTRAVENOUS

## 2012-08-25 MED ORDER — PHENYLEPHRINE HCL 10 MG/ML IJ SOLN
INTRAMUSCULAR | Status: DC | PRN
  Administered 2012-08-25: 80 ug via INTRAVENOUS
  Administered 2012-08-25 (×2): 120 ug via INTRAVENOUS

## 2012-08-25 MED ORDER — ALBUTEROL SULFATE HFA 108 (90 BASE) MCG/ACT IN AERS: 2.0000 | INHALATION_SPRAY | Freq: Four times a day (QID) | RESPIRATORY_TRACT | Status: DC | PRN

## 2012-08-25 MED ORDER — TELMISARTAN-HCTZ 80-25 MG PO TABS: 1.0000 | ORAL_TABLET | Freq: Every morning | ORAL | Status: DC

## 2012-08-25 MED ORDER — HYDROCHLOROTHIAZIDE 25 MG PO TABS
25.0000 mg | ORAL_TABLET | Freq: Every day | ORAL | Status: DC
  Administered 2012-08-25 – 2012-08-26 (×2): 25 mg via ORAL
  Filled 2012-08-25 (×2): qty 1

## 2012-08-25 MED ORDER — HYDROCODONE-ACETAMINOPHEN 7.5-325 MG PO TABS
1.0000 | ORAL_TABLET | ORAL | Status: DC
  Administered 2012-08-25 (×2): 1 via ORAL
  Administered 2012-08-26: 2 via ORAL
  Administered 2012-08-26: 1 via ORAL
  Administered 2012-08-26 (×2): 2 via ORAL
  Filled 2012-08-25 (×2): qty 2
  Filled 2012-08-25: qty 1
  Filled 2012-08-25: qty 2
  Filled 2012-08-25 (×3): qty 1

## 2012-08-25 MED ORDER — ALUM & MAG HYDROXIDE-SIMETH 200-200-20 MG/5ML PO SUSP
30.0000 mL | ORAL | Status: DC | PRN
  Administered 2012-08-25: 30 mL via ORAL
  Filled 2012-08-25: qty 30

## 2012-08-25 MED ORDER — ROCURONIUM BROMIDE 100 MG/10ML IV SOLN
INTRAVENOUS | Status: DC | PRN
  Administered 2012-08-25: 10 mg via INTRAVENOUS
  Administered 2012-08-25: 50 mg via INTRAVENOUS

## 2012-08-25 MED ORDER — ONDANSETRON HCL 4 MG/2ML IJ SOLN: 4.0000 mg | Freq: Four times a day (QID) | INTRAMUSCULAR | Status: DC | PRN

## 2012-08-25 MED ORDER — FLEET ENEMA 7-19 GM/118ML RE ENEM: 1.0000 | ENEMA | Freq: Once | RECTAL | Status: AC | PRN

## 2012-08-25 MED ORDER — PROMETHAZINE HCL 25 MG/ML IJ SOLN: 6.2500 mg | INTRAMUSCULAR | Status: DC | PRN

## 2012-08-25 MED ORDER — ATORVASTATIN CALCIUM 80 MG PO TABS
80.0000 mg | ORAL_TABLET | Freq: Every day | ORAL | Status: DC
  Administered 2012-08-25: 80 mg via ORAL
  Filled 2012-08-25 (×2): qty 1

## 2012-08-25 MED ORDER — METOCLOPRAMIDE HCL 5 MG/ML IJ SOLN
INTRAMUSCULAR | Status: DC | PRN
  Administered 2012-08-25: 10 mg via INTRAVENOUS

## 2012-08-25 MED ORDER — ZOLPIDEM TARTRATE 5 MG PO TABS: 5.0000 mg | ORAL_TABLET | Freq: Every evening | ORAL | Status: DC | PRN

## 2012-08-25 MED ORDER — METHOCARBAMOL 100 MG/ML IJ SOLN
500.0000 mg | Freq: Four times a day (QID) | INTRAVENOUS | Status: DC | PRN
  Administered 2012-08-26: 500 mg via INTRAVENOUS
  Filled 2012-08-25: qty 5

## 2012-08-25 MED ORDER — DOCUSATE SODIUM 100 MG PO CAPS
100.0000 mg | ORAL_CAPSULE | Freq: Two times a day (BID) | ORAL | Status: DC
  Administered 2012-08-25 – 2012-08-26 (×2): 100 mg via ORAL

## 2012-08-25 MED ORDER — MOMETASONE FURO-FORMOTEROL FUM 100-5 MCG/ACT IN AERO
2.0000 | INHALATION_SPRAY | Freq: Two times a day (BID) | RESPIRATORY_TRACT | Status: DC
  Administered 2012-08-25 – 2012-08-26 (×2): 2 via RESPIRATORY_TRACT
  Filled 2012-08-25: qty 8.8

## 2012-08-25 MED ORDER — PHENOL 1.4 % MT LIQD: 1.0000 | OROMUCOSAL | Status: DC | PRN

## 2012-08-25 MED ORDER — HYDROMORPHONE HCL PF 1 MG/ML IJ SOLN
0.5000 mg | INTRAMUSCULAR | Status: DC | PRN
  Administered 2012-08-25: 0.5 mg via INTRAVENOUS
  Filled 2012-08-25: qty 1

## 2012-08-25 MED ORDER — MENTHOL 3 MG MT LOZG: 1.0000 | LOZENGE | OROMUCOSAL | Status: DC | PRN

## 2012-08-25 MED ORDER — SODIUM CHLORIDE 0.9 % IV SOLN
100.0000 mL/h | INTRAVENOUS | Status: DC
  Administered 2012-08-25 – 2012-08-26 (×2): 100 mL/h via INTRAVENOUS
  Filled 2012-08-25 (×8): qty 1000

## 2012-08-25 MED ORDER — PROPOFOL 10 MG/ML IV BOLUS
INTRAVENOUS | Status: DC | PRN
  Administered 2012-08-25: 80 mg via INTRAVENOUS

## 2012-08-25 MED ORDER — DEXAMETHASONE SODIUM PHOSPHATE 4 MG/ML IJ SOLN
INTRAMUSCULAR | Status: DC | PRN
  Administered 2012-08-25: 10 mg via INTRAVENOUS

## 2012-08-25 MED ORDER — ONDANSETRON HCL 4 MG PO TABS: 4.0000 mg | ORAL_TABLET | Freq: Four times a day (QID) | ORAL | Status: DC | PRN

## 2012-08-25 MED ORDER — CELECOXIB 200 MG PO CAPS
200.0000 mg | ORAL_CAPSULE | Freq: Two times a day (BID) | ORAL | Status: DC
  Administered 2012-08-25 – 2012-08-26 (×2): 200 mg via ORAL
  Filled 2012-08-25 (×3): qty 1

## 2012-08-25 MED ORDER — TRANEXAMIC ACID 100 MG/ML IV SOLN
1000.0000 mg | Freq: Once | INTRAVENOUS | Status: AC
  Administered 2012-08-25: 1000 mg via INTRAVENOUS
  Filled 2012-08-25: qty 10

## 2012-08-25 MED ORDER — METHOCARBAMOL 500 MG PO TABS
500.0000 mg | ORAL_TABLET | Freq: Four times a day (QID) | ORAL | Status: DC | PRN
  Administered 2012-08-25 – 2012-08-26 (×2): 500 mg via ORAL
  Filled 2012-08-25 (×3): qty 1

## 2012-08-25 MED ORDER — POLYETHYLENE GLYCOL 3350 17 G PO PACK
17.0000 g | PACK | Freq: Two times a day (BID) | ORAL | Status: DC
  Administered 2012-08-25 – 2012-08-26 (×2): 17 g via ORAL

## 2012-08-25 MED ORDER — PHENYLEPHRINE HCL 10 MG/ML IJ SOLN
10.0000 mg | INTRAVENOUS | Status: DC | PRN
  Administered 2012-08-25: 30 ug/min via INTRAVENOUS

## 2012-08-25 MED ORDER — 0.9 % SODIUM CHLORIDE (POUR BTL) OPTIME
TOPICAL | Status: DC | PRN
  Administered 2012-08-25: 1000 mL

## 2012-08-25 MED ORDER — ASPIRIN EC 325 MG PO TBEC
325.0000 mg | DELAYED_RELEASE_TABLET | Freq: Two times a day (BID) | ORAL | Status: DC
  Administered 2012-08-26: 325 mg via ORAL
  Filled 2012-08-25 (×3): qty 1

## 2012-08-25 MED ORDER — FENTANYL CITRATE 0.05 MG/ML IJ SOLN
INTRAMUSCULAR | Status: DC | PRN
  Administered 2012-08-25: 50 ug via INTRAVENOUS
  Administered 2012-08-25: 100 ug via INTRAVENOUS

## 2012-08-25 MED ORDER — ONDANSETRON HCL 4 MG/2ML IJ SOLN
INTRAMUSCULAR | Status: DC | PRN
  Administered 2012-08-25: 4 mg via INTRAVENOUS

## 2012-08-25 MED ORDER — CHLORHEXIDINE GLUCONATE 4 % EX LIQD: 60.0000 mL | Freq: Once | CUTANEOUS | Status: DC

## 2012-08-25 SURGICAL SUPPLY — 39 items
BAG ZIPLOCK 12X15 (MISCELLANEOUS) IMPLANT
BLADE SAW SGTL 18X1.27X75 (BLADE) ×2 IMPLANT
CAPT HIP PF COP ×2 IMPLANT
CLOTH BEACON ORANGE TIMEOUT ST (SAFETY) ×2 IMPLANT
DERMABOND ADVANCED (GAUZE/BANDAGES/DRESSINGS) ×1
DERMABOND ADVANCED .7 DNX12 (GAUZE/BANDAGES/DRESSINGS) ×1 IMPLANT
DRAPE C-ARM 42X120 X-RAY (DRAPES) ×2 IMPLANT
DRAPE STERI IOBAN 125X83 (DRAPES) ×2 IMPLANT
DRAPE U-SHAPE 47X51 STRL (DRAPES) ×2 IMPLANT
DRSG AQUACEL AG ADV 3.5X10 (GAUZE/BANDAGES/DRESSINGS) ×2 IMPLANT
DRSG TEGADERM 4X4.75 (GAUZE/BANDAGES/DRESSINGS) IMPLANT
DURAPREP 26ML APPLICATOR (WOUND CARE) ×2 IMPLANT
ELECT BLADE TIP CTD 4 INCH (ELECTRODE) ×2 IMPLANT
ELECT REM PT RETURN 9FT ADLT (ELECTROSURGICAL) ×2
ELECTRODE REM PT RTRN 9FT ADLT (ELECTROSURGICAL) ×1 IMPLANT
EVACUATOR 1/8 PVC DRAIN (DRAIN) IMPLANT
FACESHIELD LNG OPTICON STERILE (SAFETY) ×6 IMPLANT
GAUZE SPONGE 2X2 8PLY STRL LF (GAUZE/BANDAGES/DRESSINGS) IMPLANT
GLOVE BIOGEL PI IND STRL 7.5 (GLOVE) ×1 IMPLANT
GLOVE BIOGEL PI IND STRL 8 (GLOVE) ×1 IMPLANT
GLOVE BIOGEL PI INDICATOR 7.5 (GLOVE) ×1
GLOVE BIOGEL PI INDICATOR 8 (GLOVE) ×1
GLOVE ECLIPSE 8.0 STRL XLNG CF (GLOVE) ×2 IMPLANT
GLOVE ORTHO TXT STRL SZ7.5 (GLOVE) ×4 IMPLANT
GOWN BRE IMP PREV XXLGXLNG (GOWN DISPOSABLE) ×4 IMPLANT
GOWN STRL NON-REIN LRG LVL3 (GOWN DISPOSABLE) ×2 IMPLANT
HOOD PEEL AWAY FACE SHEILD DIS (HOOD) ×2 IMPLANT
KIT BASIN OR (CUSTOM PROCEDURE TRAY) ×2 IMPLANT
PACK TOTAL JOINT (CUSTOM PROCEDURE TRAY) ×2 IMPLANT
PADDING CAST COTTON 6X4 STRL (CAST SUPPLIES) ×2 IMPLANT
SPONGE GAUZE 2X2 STER 10/PKG (GAUZE/BANDAGES/DRESSINGS)
SUCTION FRAZIER 12FR DISP (SUCTIONS) ×2 IMPLANT
SUT MNCRL AB 4-0 PS2 18 (SUTURE) ×2 IMPLANT
SUT VIC AB 1 CT1 36 (SUTURE) ×6 IMPLANT
SUT VIC AB 2-0 CT1 27 (SUTURE) ×3
SUT VIC AB 2-0 CT1 TAPERPNT 27 (SUTURE) ×3 IMPLANT
SUT VLOC 180 0 24IN GS25 (SUTURE) ×2 IMPLANT
TOWEL OR 17X26 10 PK STRL BLUE (TOWEL DISPOSABLE) ×2 IMPLANT
TRAY FOLEY CATH 14FRSI W/METER (CATHETERS) ×2 IMPLANT

## 2012-08-25 NOTE — Op Note (Signed)
NAME:  Rita Payne                ACCOUNT NO.: [id number]      MEDICAL RECORD NO.: [id number]      FACILITY:  [facility]      PHYSICIAN:  Durene Romans D  DATE OF BIRTH:  [birth year]     DATE OF PROCEDURE:  08/25/2012                                 OPERATIVE REPORT         PREOPERATIVE DIAGNOSIS: Right  hip osteoarthritis.      POSTOPERATIVE DIAGNOSIS:  Right hip osteoarthritis.      PROCEDURE:  Right total hip replacement through an anterior approach   utilizing DePuy THR system, component size 50mm pinnacle cup, a size 32+4 neutral   Altrex liner, a size 2 Hi Tri Lock stem with a 32+1 delta ceramic   ball.      SURGEON:  Madlyn Frankel. Charlann Boxer, M.D.      ASSISTANT:  Leilani Able, PA-C      ANESTHESIA:  General.      SPECIMENS:  None.      COMPLICATIONS:  None.      BLOOD LOSS:  100 cc     DRAINS:  One Hemovac.      INDICATION OF THE PROCEDURE:  Rita Payne is a 67 y.o. female who had   presented to office for evaluation of right hip pain.  Radiographs revealed   progressive degenerative changes with bone-on-bone   articulation to the  hip joint.  The patient had painful limited range of   motion significantly affecting their overall quality of life.  The patient was failing to    respond to conservative measures, and at this point was ready   to proceed with more definitive measures.  The patient has noted progressive   degenerative changes in his hip, progressive problems and dysfunction   with regarding the hip prior to surgery.  Consent was obtained for   benefit of pain relief.  Specific risk of infection, DVT, component   failure, dislocation, need for revision surgery, as well discussion of   the anterior versus posterior approach were reviewed.  Consent was   obtained for benefit of anterior pain relief through an anterior   approach.      PROCEDURE IN DETAIL:  The patient was brought to operative theater.   Once adequate anesthesia,  preoperative antibiotics, 2gm Ancef administered.   The patient was positioned supine on the OSI Hanna table.  Once adequate   padding of boney process was carried out, we had predraped out the hip, and  used fluoroscopy to confirm orientation of the pelvis and position.      The right hip was then prepped and draped from proximal iliac crest to   mid thigh with shower curtain technique.      Time-out was performed identifying the patient, planned procedure, and   extremity.     An incision was then made 2 cm distal and lateral to the   anterior superior iliac spine extending over the orientation of the   tensor fascia lata muscle and sharp dissection was carried down to the   fascia of the muscle and protractor placed in the soft tissues.      The fascia was then incised.  The muscle belly was identified and swept  laterally and retractor placed along the superior neck.  Following   cauterization of the circumflex vessels and removing some pericapsular   fat, a second cobra retractor was placed on the inferior neck.  A third   retractor was placed on the anterior acetabulum after elevating the   anterior rectus.  A L-capsulotomy was along the line of the   superior neck to the trochanteric fossa, then extended proximally and   distally.  Tag sutures were placed and the retractors were then placed   intracapsular.  We then identified the trochanteric fossa and   orientation of my neck cut, confirmed this radiographically   and then made a neck osteotomy with the femur on traction.  The femoral   head was removed without difficulty or complication.  Traction was let   off and retractors were placed posterior and anterior around the   acetabulum.      The labrum and foveal tissue were debrided.  I began reaming with a 45mm   reamer and reamed up to 49mm reamer with good bony bed preparation and a 50   cup was chosen.  The final 50mm Pinnacle cup was then impacted under fluoroscopy  to  confirm the depth of penetration and orientation with respect to   abduction.  A screw was placed followed by the hole eliminator.  The final   32+4 neutral Altrex liner was impacted with good visualized rim fit.  The cup was positioned anatomically within the acetabular portion of the pelvis.      At this point, the femur was rolled at 80 degrees.  Further capsule was   released off the inferior aspect of the femoral neck.  I then   released the superior capsule proximally.  The hook was placed laterally   along the femur and elevated manually and held in position with the bed   hook.  The leg was then extended and adducted with the leg rolled to 100   degrees of external rotation.  Once the proximal femur was fully   exposed, I used a box osteotome to set orientation.  I then began   broaching with the starting chili pepper broach and passed this by hand and then broached up to 2.  With the 2 broach in place I chose a high offset neck and did a trial reduction with the 32+1 trial ball.  The offset was appropriate, leg lengths   appeared to be equal, confirmed radiographically.   Given these findings, I went ahead and dislocated the hip, repositioned all   retractors and positioned the right hip in the extended and abducted position.  The final 2 Hi Tri Lock stem was   chosen and it was impacted down to the level of neck cut.  Based on this   and the trial reduction, a 32+1 delta ceramic ball was chosen and   impacted onto a clean and dry trunnion, and the hip was reduced.  The   hip had been irrigated throughout the case again at this point.  I did   reapproximate the superior capsular leaflet to the anterior leaflet   using #1 Vicryl, placed a medium Hemovac drain deep.  The fascia of the   tensor fascia lata muscle was then reapproximated using #1 Vicryl.  The   remaining wound was closed with 2-0 Vicryl and running 4-0 Monocryl.   The hip was cleaned, dried, and dressed sterilely using  Dermabond and   Aquacel dressing.  Drain site  dressed separately.  She was then brought   to recovery room in stable condition tolerating the procedure well.    Leilani Able, PA-C was present for the entirety of the case involved from   preoperative positioning, perioperative retractor management, general   facilitation of the case, as well as primary wound closure as assistant.            Madlyn Frankel Charlann Boxer, M.D.            MDO/MEDQ  D:  10/30/2010  T:  10/30/2010  Job:  [id number]      Electronically Signed by Durene Romans M.D. on 11/05/2010 09:15:38 AM

## 2012-08-25 NOTE — Interval H&P Note (Signed)
History and Physical Interval Note:  08/25/2012 9:57 AM  Rita Payne  has presented today for surgery, with the diagnosis of RIGHT HIP OSTEOARTHRITIS  The various methods of treatment have been discussed with the patient and family. After consideration of risks, benefits and other options for treatment, the patient has consented to  Procedure(s): RIGHT TOTAL HIP ARTHROPLASTY ANTERIOR APPROACH (Right) as a surgical intervention .  The patient's history has been reviewed, patient examined, no change in status, stable for surgery.  I have reviewed the patient's chart and labs.  Questions were answered to the patient's satisfaction.     Shelda Pal

## 2012-08-25 NOTE — Transfer of Care (Signed)
Immediate Anesthesia Transfer of Care Note  Patient: Rita Payne  Procedure(s) Performed: Procedure(s): RIGHT TOTAL HIP ARTHROPLASTY ANTERIOR APPROACH (Right)  Patient Location: PACU  Anesthesia Type:Regional  Level of Consciousness: sedated  Airway & Oxygen Therapy: Patient Spontanous Breathing and Patient connected to face mask oxygen  Post-op Assessment: Report given to PACU RN and Post -op Vital signs reviewed and stable  Post vital signs: Reviewed and stable  Complications: No apparent anesthesia complications

## 2012-08-25 NOTE — Anesthesia Preprocedure Evaluation (Signed)
Anesthesia Evaluation    Airway Mallampati: II TM Distance: >3 FB Neck ROM: Full    Dental  (+) Edentulous Upper and Dental Advisory Given   Pulmonary asthma , sleep apnea , pneumonia -,  breath sounds clear to auscultation  Pulmonary exam normal       Cardiovascular Rhythm:Regular Rate:Normal     Neuro/Psych    GI/Hepatic   Endo/Other  diabetes (Diet Control)  Renal/GU Renal disease     Musculoskeletal   Abdominal   Peds  Hematology   Anesthesia Other Findings   Reproductive/Obstetrics                           Anesthesia Physical Anesthesia Plan  ASA: II  Anesthesia Plan: General   Post-op Pain Management:    Induction: Intravenous  Airway Management Planned: Oral ETT  Additional Equipment:   Intra-op Plan:   Post-operative Plan: Extubation in OR  Informed Consent: I have reviewed the patients History and Physical, chart, labs and discussed the procedure including the risks, benefits and alternatives for the proposed anesthesia with the patient or authorized representative who has indicated his/her understanding and acceptance.   Dental advisory given  Plan Discussed with: CRNA  Anesthesia Plan Comments:         Anesthesia Quick Evaluation

## 2012-08-25 NOTE — Preoperative (Signed)
Beta Blockers   Reason not to administer Beta Blockers:Not Applicable, not on home BB 

## 2012-08-26 ENCOUNTER — Encounter (HOSPITAL_COMMUNITY): Payer: Self-pay | Admitting: Orthopedic Surgery

## 2012-08-26 DIAGNOSIS — D500 Iron deficiency anemia secondary to blood loss (chronic): Secondary | ICD-10-CM | POA: Diagnosis not present

## 2012-08-26 LAB — BASIC METABOLIC PANEL
BUN: 10 mg/dL (ref 6–23)
Calcium: 8.9 mg/dL (ref 8.4–10.5)
Creatinine, Ser: 0.62 mg/dL (ref 0.50–1.10)
GFR calc Af Amer: >90 mL/min (ref >90)
GFR calc non Af Amer: >90 mL/min (ref >90)
Potassium: 3.5 mEq/L (ref 3.5–5.1)

## 2012-08-26 LAB — CBC
HCT: 30.8 % — ABNORMAL LOW (ref 36.0–46.0)
MCHC: 35.1 g/dL (ref 30.0–36.0)
MCV: 92.2 fL (ref 78.0–100.0)
RDW: 14.6 % (ref 11.5–15.5)

## 2012-08-26 MED ORDER — CELECOXIB 200 MG PO CAPS: 200.0000 mg | ORAL_CAPSULE | Freq: Two times a day (BID) | ORAL | Status: DC

## 2012-08-26 MED ORDER — ASPIRIN 325 MG PO TBEC: 325.0000 mg | DELAYED_RELEASE_TABLET | Freq: Two times a day (BID) | ORAL | Status: AC

## 2012-08-26 MED ORDER — DSS 100 MG PO CAPS: 100.0000 mg | ORAL_CAPSULE | Freq: Two times a day (BID) | ORAL | Status: DC

## 2012-08-26 MED ORDER — HYDROCODONE-ACETAMINOPHEN 7.5-325 MG PO TABS: 1.0000 | ORAL_TABLET | ORAL | Status: DC | PRN

## 2012-08-26 MED ORDER — FERROUS SULFATE 325 (65 FE) MG PO TABS: 325.0000 mg | ORAL_TABLET | Freq: Three times a day (TID) | ORAL | Status: DC

## 2012-08-26 MED ORDER — METHOCARBAMOL 500 MG PO TABS: 500.0000 mg | ORAL_TABLET | Freq: Four times a day (QID) | ORAL | Status: DC | PRN

## 2012-08-26 MED ORDER — POLYETHYLENE GLYCOL 3350 17 G PO PACK: 17.0000 g | PACK | Freq: Two times a day (BID) | ORAL | Status: DC

## 2012-08-26 NOTE — Evaluation (Signed)
Occupational Therapy Evaluation Patient Details Name: Nylee Barbuto MRN: [id number] DOB: 1945/10/13 Today's Date: 08/26/2012 Time: 1009-1030 OT Time Calculation (min): 21 min  OT Assessment / Plan / Recommendation History of present illness Johara Lodwick, 67 y.o. female, has a history of pain and functional disability in the right hip(s) due to trauma and patient has failed non-surgical conservative treatments for greater than 12 weeks to include NSAID's and/or analgesics, use of assistive devices and activity modification.  Onset of symptoms was gradual starting 5 years ago with rapidlly worsening course since that time.The patient noted no past surgery on the right hip(s).  Patient currently rates pain in the right hip at 10 out of 10 with activity. Patient has night pain, worsening of pain with activity and weight bearing, trendelenberg gait, pain that interfers with activities of daily living and pain with passive range of motion. Patient has evidence of periarticular osteophytes and joint space narrowing by imaging studies. This condition presents safety issues increasing the risk of falls   Clinical Impression   Pt is doing well with ADL and is supposed to d/c later today. She is overall at min assist level with ADL and has family assist at d/c. She has 3in1 delivered to room. Will benefit from skilled OT if here after today to practice functional transfers further but feel ok to d/c today if that is the plan.    OT Assessment  Patient needs continued OT Services    Follow Up Recommendations  No OT follow up;Supervision/Assistance - 24 hour    Barriers to Discharge      Equipment Recommendations  3 in 1 bedside comode (already in room)    Recommendations for Other Services    Frequency  Min 2X/week    Precautions / Restrictions Precautions Precautions: None Restrictions Weight Bearing Restrictions: No   Pertinent Vitals/Pain 3/10 spasms R LE; reposition, cold    ADL  Eating/Feeding: Simulated;Independent Where Assessed - Eating/Feeding: Chair Grooming: Performed;Wash/dry hands;Min guard Where Assessed - Grooming: Unsupported standing Upper Body Bathing: Simulated;Chest;Right arm;Left arm;Abdomen;Set up Where Assessed - Upper Body Bathing: Unsupported sitting Lower Body Bathing: Simulated;Minimal assistance Where Assessed - Lower Body Bathing: Supported sit to stand Upper Body Dressing: Simulated;Set up Where Assessed - Upper Body Dressing: Unsupported sitting Lower Body Dressing: Simulated;Minimal assistance;Moderate assistance Where Assessed - Lower Body Dressing: Supported sit to stand Toilet Transfer: Performed;Minimal Dentist Method: Other (comment) (with walker into bathroom) Toilet Transfer Equipment: Bedside commode Toileting - Clothing Manipulation and Hygiene: Simulated;Minimal assistance Where Assessed - Engineer, mining and Hygiene: Standing Tub/Shower Transfer: Simulated;Minimal assistance Equipment Used: Rolling walker ADL Comments: Pt states spouse and daugther can help with LB ADL and she isnt interested in AE at this time.     OT Diagnosis: Generalized weakness  OT Problem List: Decreased strength;Decreased knowledge of use of DME or AE OT Treatment Interventions: Self-care/ADL training;DME and/or AE instruction;Patient/family education;Therapeutic activities   OT Goals(Current goals can be found in the care plan section) Acute Rehab OT Goals Patient Stated Goal: wants to return to independence OT Goal Formulation: With patient Time For Goal Achievement: 09/02/12 Potential to Achieve Goals: Good  Visit Information  Last OT Received On: 08/26/12 Assistance Needed: +1 History of Present Illness: Leiloni Smithers, 67 y.o. female, has a history of pain and functional disability in the right hip(s) due to trauma and patient has failed non-surgical conservative treatments for greater than 12 weeks to  include NSAID's and/or analgesics, use of assistive devices and activity modification.  Onset of symptoms was gradual starting 5 years ago with rapidlly worsening course since that time.The patient noted no past surgery on the right hip(s).  Patient currently rates pain in the right hip at 10 out of 10 with activity. Patient has night pain, worsening of pain with activity and weight bearing, trendelenberg gait, pain that interfers with activities of daily living and pain with passive range of motion. Patient has evidence of periarticular osteophytes and joint space narrowing by imaging studies. This condition presents safety issues increasing the risk of falls       Prior Functioning     Home Living Family/patient expects to be discharged to:: Private residence Living Arrangements: Spouse/significant other;Children Available Help at Discharge: Family Type of Home: House Home Access: Ramped entrance Home Equipment: Bedside commode;Walker - 2 wheels;Shower seat (RW and 3in1 delivered to room) Prior Function Level of Independence: Needs assistance ADL's / Homemaking Assistance Needed: had had help with LB dressing PTA Communication Communication: No difficulties         Vision/Perception     Cognition  Cognition Arousal/Alertness: Awake/alert Behavior During Therapy: WFL for tasks assessed/performed Overall Cognitive Status: Within Functional Limits for tasks assessed    Extremity/Trunk Assessment Upper Extremity Assessment Upper Extremity Assessment: Overall WFL for tasks assessed     Mobility Transfers Transfers: Sit to Stand;Stand to Sit Sit to Stand: 4: Min assist;With upper extremity assist;From chair/3-in-1 Stand to Sit: 4: Min assist;With upper extremity assist;To chair/3-in-1 Details for Transfer Assistance: min verbal cues for hand placement     Exercise     Balance Balance Balance Assessed: Yes Dynamic Standing Balance Dynamic Standing - Level of Assistance: 4:  Min assist   End of Session OT - End of Session Activity Tolerance: Patient tolerated treatment well Patient left: in chair;with call bell/phone within reach  GO     Lennox Laity [phone] 08/26/2012, 10:39 AM

## 2012-08-26 NOTE — Evaluation (Signed)
Physical Therapy Evaluation Patient Details Name: Rita Payne MRN: [id number] DOB: 1945/09/14 Today's Date: 08/26/2012 Time: 4098-1191 PT Time Calculation (min): 30 min  PT Assessment / Plan / Recommendation History of Present Illness  Rita Payne, 67 y.o. female, has a history of pain and functional disability in the right hip(s) due to trauma and patient has failed non-surgical conservative treatments for greater than 12 weeks to include NSAID's and/or analgesics, use of assistive devices and activity modification.  Onset of symptoms was gradual starting 5 years ago with rapidlly worsening course since that time.The patient noted no past surgery on the right hip(s).  Patient currently rates pain in the right hip at 10 out of 10 with activity. Patient has night pain, worsening of pain with activity and weight bearing, trendelenberg gait, pain that interfers with activities of daily living and pain with passive range of motion. Patient has evidence of periarticular osteophytes and joint space narrowing by imaging studies. This condition presents safety issues increasing the risk of falls  Clinical Impression  Pt s/p R THR presents with functional mobility limitations 2* decreased R LE strength/ROM and post op pain.  Pt should progress to d/c home with family assist and HHPT follow up.    PT Assessment  Patient needs continued PT services    Follow Up Recommendations  Home health PT    Does the patient have the potential to tolerate intense rehabilitation      Barriers to Discharge        Equipment Recommendations  Rolling walker with 5" wheels    Recommendations for Other Services OT consult   Frequency 7X/week    Precautions / Restrictions Precautions Precautions: None Restrictions Weight Bearing Restrictions: No Other Position/Activity Restrictions: WBAT   Pertinent Vitals/Pain 4/10; premed, ice packs provided      Mobility  Bed Mobility Bed Mobility: Supine to Sit Supine  to Sit: 4: Min assist;With rails Details for Bed Mobility Assistance: cues for sequence and use of L LE to self assist Transfers Transfers: Sit to Stand;Stand to Sit Sit to Stand: 4: Min assist;With upper extremity assist;From chair/3-in-1 Stand to Sit: 4: Min assist;With upper extremity assist;To chair/3-in-1 Details for Transfer Assistance: min verbal cues for hand placement Ambulation/Gait Ambulation/Gait Assistance: 4: Min assist Ambulation Distance (Feet): 120 Feet Assistive device: Rolling walker Ambulation/Gait Assistance Details: cues for sequence, posture and position from RW Gait Pattern: Step-to pattern;Step-through pattern;Decreased step length - right;Decreased step length - left;Shuffle;Trunk flexed Stairs: No    Exercises Total Joint Exercises Ankle Circles/Pumps: AROM;10 reps;Supine;Both Quad Sets: AROM;10 reps;Both;Supine Heel Slides: AAROM;15 reps;Right;Supine Hip ABduction/ADduction: AAROM;Right;15 reps;Supine   PT Diagnosis: Difficulty walking  PT Problem List: Decreased strength;Decreased range of motion;Decreased activity tolerance;Decreased mobility;Decreased knowledge of use of DME;Pain;Obesity PT Treatment Interventions: DME instruction;Gait training;Stair training;Functional mobility training;Therapeutic activities;Therapeutic exercise;Patient/family education     PT Goals(Current goals can be found in the care plan section) Acute Rehab PT Goals Patient Stated Goal: wants to return to independence PT Goal Formulation: With patient Time For Goal Achievement: 08/29/12 Potential to Achieve Goals: Good  Visit Information  Last PT Received On: 08/26/12 Assistance Needed: +1 History of Present Illness: Rita Payne, 67 y.o. female, has a history of pain and functional disability in the right hip(s) due to trauma and patient has failed non-surgical conservative treatments for greater than 12 weeks to include NSAID's and/or analgesics, use of assistive devices  and activity modification.  Onset of symptoms was gradual starting 5 years ago with rapidlly worsening course since that  time.The patient noted no past surgery on the right hip(s).  Patient currently rates pain in the right hip at 10 out of 10 with activity. Patient has night pain, worsening of pain with activity and weight bearing, trendelenberg gait, pain that interfers with activities of daily living and pain with passive range of motion. Patient has evidence of periarticular osteophytes and joint space narrowing by imaging studies. This condition presents safety issues increasing the risk of falls       Prior Functioning  Home Living Family/patient expects to be discharged to:: Private residence Living Arrangements: Spouse/significant other;Children Available Help at Discharge: Family Type of Home: House Home Access: Ramped entrance Entrance Stairs-Number of Steps: 4 Entrance Stairs-Rails: Right;Left;Can reach both Home Layout: One level Home Equipment: Bedside commode;Walker - 2 wheels;Shower seat (RW and 3in1 delivered to room) Prior Function Level of Independence: Needs assistance ADL's / Homemaking Assistance Needed: had had help with LB dressing PTA Communication Communication: No difficulties    Cognition  Cognition Arousal/Alertness: Awake/alert Behavior During Therapy: WFL for tasks assessed/performed Overall Cognitive Status: Within Functional Limits for tasks assessed    Extremity/Trunk Assessment Upper Extremity Assessment Upper Extremity Assessment: Overall WFL for tasks assessed Lower Extremity Assessment Lower Extremity Assessment: RLE deficits/detail RLE Deficits / Details: 2+/5 hip strength with AAROM at hip to 75 flex and 10 abd   Balance Balance Balance Assessed: Yes Dynamic Standing Balance Dynamic Standing - Level of Assistance: 4: Min assist  End of Session PT - End of Session Equipment Utilized During Treatment: Gait belt Activity Tolerance: Patient  tolerated treatment well Patient left: in chair;with call bell/phone within reach;with nursing/sitter in room Nurse Communication: Mobility status  GP     Rita Payne 08/26/2012, 11:51 AM

## 2012-08-26 NOTE — Progress Notes (Signed)
Physical Therapy Treatment Patient Details Name: Kye Silverstein MRN: [id number] DOB: July 18, 1945 Today's Date: 08/26/2012 Time: 6213-0865 PT Time Calculation (min): 23 min  PT Assessment / Plan / Recommendation  History of Present Illness     PT Comments   Reviewed car transfers and stairs with pt and then with dtr.    Follow Up Recommendations  Home health PT     Does the patient have the potential to tolerate intense rehabilitation     Barriers to Discharge        Equipment Recommendations  Rolling walker with 5" wheels    Recommendations for Other Services OT consult  Frequency 7X/week   Progress towards PT Goals Progress towards PT goals: Progressing toward goals  Plan Current plan remains appropriate    Precautions / Restrictions Precautions Precautions: None Restrictions Weight Bearing Restrictions: No Other Position/Activity Restrictions: WBAT   Pertinent Vitals/Pain 3/10; premed,     Mobility  Bed Mobility Bed Mobility: Sit to Supine Sit to Supine: 4: Min guard Details for Bed Mobility Assistance: cues for sequence and use of L LE to self assist Transfers Transfers: Sit to Stand;Stand to Sit Sit to Stand: 4: Min guard Stand to Sit: 4: Min guard Details for Transfer Assistance: min verbal cues for hand placement Ambulation/Gait Ambulation/Gait Assistance: 4: Min guard Ambulation Distance (Feet): 85 Feet Assistive device: Rolling walker Ambulation/Gait Assistance Details: cues for posture, position frm RW and sequence Gait Pattern: Step-to pattern;Step-through pattern;Decreased step length - right;Decreased step length - left;Shuffle;Trunk flexed Stairs: Yes Stairs Assistance: 4: Min assist Stairs Assistance Details (indicate cue type and reason): cues for sequence Stair Management Technique: Two rails;Forwards;Step to pattern Number of Stairs: 4    Exercises     PT Diagnosis:    PT Problem List:   PT Treatment Interventions:     PT Goals (current  goals can now be found in the care plan section) Acute Rehab PT Goals Patient Stated Goal: wants to return to independence PT Goal Formulation: With patient Time For Goal Achievement: 08/29/12 Potential to Achieve Goals: Good  Visit Information  Last PT Received On: 08/26/12 Assistance Needed: +1    Subjective Data  Subjective: I'm going home in an hour Patient Stated Goal: wants to return to independence   Cognition  Cognition Arousal/Alertness: Awake/alert Behavior During Therapy: WFL for tasks assessed/performed Overall Cognitive Status: Within Functional Limits for tasks assessed    Balance     End of Session PT - End of Session Equipment Utilized During Treatment: Gait belt Activity Tolerance: Patient tolerated treatment well Patient left: in bed;with call bell/phone within reach;with family/visitor present Nurse Communication: Mobility status   GP     Cyril Railey 08/26/2012, 3:33 PM

## 2012-08-26 NOTE — Anesthesia Postprocedure Evaluation (Signed)
Anesthesia Post Note  Patient: Rita Payne  Procedure(s) Performed: Procedure(s) (LRB): RIGHT TOTAL HIP ARTHROPLASTY ANTERIOR APPROACH (Right)  Anesthesia type: General  Patient location: PACU  Post pain: Pain level controlled  Post assessment: Post-op Vital signs reviewed  Last Vitals:  Filed Vitals:   08/26/12 0927  BP: 104/63  Pulse: 70  Temp: 36.8 C  Resp:     Post vital signs: Reviewed  Level of consciousness: sedated  Complications: No apparent anesthesia complications

## 2012-08-26 NOTE — Progress Notes (Signed)
[facility]  [facility] is providing the following services: RW and Commode  If patient discharges after hours, please call [phone].   Renard Hamper [phone] 08/26/2012, 8:29 AM

## 2012-08-26 NOTE — Care Management Note (Signed)
    Page 1 of 2   08/26/2012     5:22:39 PM   CARE MANAGEMENT NOTE 08/26/2012  Patient:  Rita Payne, Rita Payne   Account Number:  [id number]  Date Initiated:  08/26/2012  Documentation initiated by:  Colleen Can  Subjective/Objective Assessment:   dx rt hip OA; total hip replacemnt-anterior approach     Action/Plan:   Cm spoke with patient. Plans are for her to return to her home in [place] where daughter and sisters will be her caregivers.   Anticipated DC Date:  08/26/2012   Anticipated DC Plan:  HOME W HOME HEALTH SERVICES      DC Planning Services  CM consult      PAC Choice  DURABLE MEDICAL EQUIPMENT  HOME HEALTH   Choice offered to / List presented to:  C-1 Patient   DME arranged  3-N-1  Levan Hurst      DME agency  [facility]     HH arranged  HH-2 PT      [facility] agency  [facility]   Status of service:  Completed, signed off Medicare Important Message given?  NA - LOS <3 / Initial given by admissions (If response is "NO", the following Medicare IM given date fields will be blank) Date Medicare IM given:   Date Additional Medicare IM given:    Discharge Disposition:  HOME W HOME HEALTH SERVICES  Per UR Regulation:    If discussed at Long Length of Stay Meetings, dates discussed:    Comments:  08/26/2012 Colleen Can BSN RN CCM [phone] DME has been delivered to patient's room. [facility] WILL PROVIDE SERVICES.

## 2012-08-26 NOTE — Progress Notes (Signed)
   Subjective: 1 Day Post-Op Procedure(s) (LRB): RIGHT TOTAL HIP ARTHROPLASTY ANTERIOR APPROACH (Right)   Patient reports pain as mild, pain well controlled. No events throughout the night. Ready to be discharged home.   Objective:   VITALS:   Filed Vitals:   08/26/12 0520  BP: 103/63  Pulse: 77  Temp: 97.2 F (36.2 C)  Resp: 16    Neurovascular intact Dorsiflexion/Plantar flexion intact Incision: dressing C/D/I No cellulitis present Compartment soft  LABS  Recent Labs  08/26/12 0423  HGB 10.8*  HCT 30.8*  WBC 12.1*  PLT 214     Recent Labs  08/26/12 0423  NA 136  K 3.5  BUN 10  CREATININE 0.62  GLUCOSE 137*     Assessment/Plan: 1 Day Post-Op Procedure(s) (LRB): RIGHT TOTAL HIP ARTHROPLASTY ANTERIOR APPROACH (Right) Foley cath d/c'ed Advance diet Up with therapy D/C IV fluids Discharge home with home health Follow up in 2 weeks at [facility]. Follow up with OLIN,Benzion Mesta D in 2 weeks.  Contact information:  [facility] [address] [place] [address] [phone]    Expected ABLA  Treated with iron and will observe  Obese (BMI 30-39.9) Estimated body mass index is 32.28 kg/(m^2) as calculated from the following:   Height as of this encounter: 5\' 5"  (1.651 m).   Weight as of this encounter: 87.998 kg (194 lb). Patient also counseled that weight may inhibit the healing process Patient counseled that losing weight will help with future health issues      Anastasio Auerbach. Jash Wahlen   PAC  08/26/2012, 8:18 AM

## 2012-08-27 DIAGNOSIS — M159 Polyosteoarthritis, unspecified: Secondary | ICD-10-CM | POA: Diagnosis not present

## 2012-08-27 DIAGNOSIS — I10 Essential (primary) hypertension: Secondary | ICD-10-CM | POA: Diagnosis not present

## 2012-08-27 DIAGNOSIS — E119 Type 2 diabetes mellitus without complications: Secondary | ICD-10-CM | POA: Diagnosis not present

## 2012-08-27 DIAGNOSIS — E789 Disorder of lipoprotein metabolism, unspecified: Secondary | ICD-10-CM | POA: Diagnosis not present

## 2012-08-27 DIAGNOSIS — IMO0001 Reserved for inherently not codable concepts without codable children: Secondary | ICD-10-CM | POA: Diagnosis not present

## 2012-08-27 DIAGNOSIS — Z96649 Presence of unspecified artificial hip joint: Secondary | ICD-10-CM | POA: Diagnosis not present

## 2012-08-27 DIAGNOSIS — Z471 Aftercare following joint replacement surgery: Secondary | ICD-10-CM | POA: Diagnosis not present

## 2012-08-27 DIAGNOSIS — J45909 Unspecified asthma, uncomplicated: Secondary | ICD-10-CM | POA: Diagnosis not present

## 2012-08-28 DIAGNOSIS — E119 Type 2 diabetes mellitus without complications: Secondary | ICD-10-CM | POA: Diagnosis not present

## 2012-08-28 DIAGNOSIS — M159 Polyosteoarthritis, unspecified: Secondary | ICD-10-CM | POA: Diagnosis not present

## 2012-08-28 DIAGNOSIS — I10 Essential (primary) hypertension: Secondary | ICD-10-CM | POA: Diagnosis not present

## 2012-08-28 DIAGNOSIS — Z471 Aftercare following joint replacement surgery: Secondary | ICD-10-CM | POA: Diagnosis not present

## 2012-08-28 DIAGNOSIS — J45909 Unspecified asthma, uncomplicated: Secondary | ICD-10-CM | POA: Diagnosis not present

## 2012-08-28 DIAGNOSIS — IMO0001 Reserved for inherently not codable concepts without codable children: Secondary | ICD-10-CM | POA: Diagnosis not present

## 2012-08-31 DIAGNOSIS — IMO0001 Reserved for inherently not codable concepts without codable children: Secondary | ICD-10-CM | POA: Diagnosis not present

## 2012-08-31 DIAGNOSIS — I10 Essential (primary) hypertension: Secondary | ICD-10-CM | POA: Diagnosis not present

## 2012-08-31 DIAGNOSIS — E119 Type 2 diabetes mellitus without complications: Secondary | ICD-10-CM | POA: Diagnosis not present

## 2012-08-31 DIAGNOSIS — Z471 Aftercare following joint replacement surgery: Secondary | ICD-10-CM | POA: Diagnosis not present

## 2012-08-31 DIAGNOSIS — J45909 Unspecified asthma, uncomplicated: Secondary | ICD-10-CM | POA: Diagnosis not present

## 2012-08-31 DIAGNOSIS — M159 Polyosteoarthritis, unspecified: Secondary | ICD-10-CM | POA: Diagnosis not present

## 2012-08-31 NOTE — Discharge Summary (Signed)
Physician Discharge Summary  Patient ID: Rita Payne MRN: [id number] DOB/AGE: [birth year] 65 y.o.  Admit date: 08/25/2012 Discharge date: 08/26/2012   Procedures:  Procedure(s) (LRB): RIGHT TOTAL HIP ARTHROPLASTY ANTERIOR APPROACH (Right)  Attending Physician:  Dr. Durene Romans   Admission Diagnoses:   Right hip OA / pain  Discharge Diagnoses:  Principal Problem:   S/P right THA, AA Active Problems:   Expected blood loss anemia   Obese  Past Medical History  Diagnosis Date  . Asthma   . Hypertension   . Hyperlipidemia   . Arthritis   . Pneumonia     hx of   . Diabetes mellitus without complication     type II not on meds 5.9 AIC on 07/14/12   . Sleep apnea     mild sleep apnea 10 years ago not on CPAP   . Kidney stones     hx of   . UTI (urinary tract infection)     hx of     HPI: Rita Payne, 67 y.o. female, has a history of pain and functional disability in the right hip(s) due to trauma and patient has failed non-surgical conservative treatments for greater than 12 weeks to include NSAID's and/or analgesics, use of assistive devices and activity modification. Onset of symptoms was gradual starting 5 years ago with rapidlly worsening course since that time.The patient noted no past surgery on the right hip(s). Patient currently rates pain in the right hip at 10 out of 10 with activity. Patient has night pain, worsening of pain with activity and weight bearing, trendelenberg gait, pain that interfers with activities of daily living and pain with passive range of motion. Patient has evidence of periarticular osteophytes and joint space narrowing by imaging studies. This condition presents safety issues increasing the risk of falls. There is no current active infection. Risks, benefits and expectations were discussed with the patient. Patient understand the risks, benefits and expectations and wishes to proceed with surgery.   PCP: Rudi Heap, MD   Discharged Condition:  good  Hospital Course:  Patient underwent the above stated procedure on 08/25/2012. Patient tolerated the procedure well and brought to the recovery room in good condition and subsequently to the floor.  POD #1 BP: 103/63 ; Pulse: 77 ; Temp: 97.2 F (36.2 C) ; Resp: 16  Pt's foley was removed, as well as the hemovac drain removed. IV was changed to a saline lock. Patient reports pain as mild, pain well controlled. No events throughout the night. Ready to be discharged home.  Neurovascular intact, dorsiflexion/plantar flexion intact, incision: dressing C/D/I, no cellulitis present and compartment soft.   LABS  Basename    HGB  10.8  HCT  30.8    Discharge Exam: General appearance: alert, cooperative and no distress Extremities: Homans sign is negative, no sign of DVT, no edema, redness or tenderness in the calves or thighs and no ulcers, gangrene or trophic changes  Disposition:   Home-Health Care Svc with follow up in 2 weeks   Follow-up Information   Follow up with Shelda Pal, MD. Schedule an appointment as soon as possible for a visit in 2 weeks.   Specialty:  Orthopedic Surgery   Contact information:   [address] [place] [address] [phone]       Discharge Orders   Future Appointments Provider Department Dept Phone   10/21/2012 8:30 AM Mary-Margaret Daphine Deutscher, FNP [facility] [phone]   Future Orders Complete By Expires  Call MD / Call 911  As directed    Comments:     If you experience chest pain or shortness of breath, CALL 911 and be transported to the hospital emergency room.  If you develope a fever above 101 F, pus (white drainage) or increased drainage or redness at the wound, or calf pain, call your surgeon's office.   Change dressing  As directed    Comments:     Maintain surgical dressing for 10-14 days, then replace with 4x4 guaze and tape. Keep the area dry and clean.   Constipation Prevention  As  directed    Comments:     Drink plenty of fluids.  Prune juice may be helpful.  You may use a stool softener, such as Colace (over the counter) 100 mg twice a day.  Use MiraLax (over the counter) for constipation as needed.   Diet - low sodium heart healthy  As directed    Discharge instructions  As directed    Comments:     Maintain surgical dressing for 10-14 days, then replace with gauze and tape. Keep the area dry and clean until follow up. Follow up in 2 weeks at [facility]. Call with any questions or concerns.   Increase activity slowly as tolerated  As directed    TED hose  As directed    Comments:     Use stockings (TED hose) for 2 weeks on both leg(s).  You may remove them at night for sleeping.   Weight bearing as tolerated  As directed         Medication List    STOP taking these medications       acetaminophen 650 MG CR tablet  Commonly known as:  TYLENOL     diclofenac 75 MG EC tablet  Commonly known as:  VOLTAREN     diclofenac sodium 1 % Gel  Commonly known as:  VOLTAREN      TAKE these medications       albuterol 108 (90 BASE) MCG/ACT inhaler  Commonly known as:  PROVENTIL HFA;VENTOLIN HFA  Inhale 2 puffs into the lungs every 6 (six) hours as needed for wheezing.     aspirin 325 MG EC tablet  Take 1 tablet (325 mg total) by mouth 2 (two) times daily.     celecoxib 200 MG capsule  Commonly known as:  CELEBREX  Take 1 capsule (200 mg total) by mouth every 12 (twelve) hours.     cholecalciferol 1000 UNITS tablet  Commonly known as:  VITAMIN D  Take 1,000 Units by mouth daily.     DSS 100 MG Caps  Take 100 mg by mouth 2 (two) times daily.     ferrous sulfate 325 (65 FE) MG tablet  Take 1 tablet (325 mg total) by mouth 3 (three) times daily after meals.     Fluticasone-Salmeterol 250-50 MCG/DOSE Aepb  Commonly known as:  ADVAIR  Inhale 1 puff into the lungs every 12 (twelve) hours.     HYDROcodone-acetaminophen 7.5-325 MG per tablet    Commonly known as:  NORCO  Take 1-2 tablets by mouth every 4 (four) hours as needed for pain.     methocarbamol 500 MG tablet  Commonly known as:  ROBAXIN  Take 1 tablet (500 mg total) by mouth every 6 (six) hours as needed.     polyethylene glycol packet  Commonly known as:  MIRALAX / GLYCOLAX  Take 17 g by mouth 2 (two) times daily.  rosuvastatin 40 MG tablet  Commonly known as:  CRESTOR  Take 40 mg by mouth at bedtime.     telmisartan-hydrochlorothiazide 80-25 MG per tablet  Commonly known as:  MICARDIS HCT  Take 1 tablet by mouth every morning.         Signed: Anastasio Auerbach. Heath Badon   PAC  08/31/2012, 6:39 PM

## 2012-09-02 DIAGNOSIS — I10 Essential (primary) hypertension: Secondary | ICD-10-CM | POA: Diagnosis not present

## 2012-09-02 DIAGNOSIS — Z471 Aftercare following joint replacement surgery: Secondary | ICD-10-CM | POA: Diagnosis not present

## 2012-09-02 DIAGNOSIS — E119 Type 2 diabetes mellitus without complications: Secondary | ICD-10-CM | POA: Diagnosis not present

## 2012-09-02 DIAGNOSIS — IMO0001 Reserved for inherently not codable concepts without codable children: Secondary | ICD-10-CM | POA: Diagnosis not present

## 2012-09-02 DIAGNOSIS — J45909 Unspecified asthma, uncomplicated: Secondary | ICD-10-CM | POA: Diagnosis not present

## 2012-09-02 DIAGNOSIS — M159 Polyosteoarthritis, unspecified: Secondary | ICD-10-CM | POA: Diagnosis not present

## 2012-09-04 DIAGNOSIS — M159 Polyosteoarthritis, unspecified: Secondary | ICD-10-CM | POA: Diagnosis not present

## 2012-09-04 DIAGNOSIS — I10 Essential (primary) hypertension: Secondary | ICD-10-CM | POA: Diagnosis not present

## 2012-09-04 DIAGNOSIS — IMO0001 Reserved for inherently not codable concepts without codable children: Secondary | ICD-10-CM | POA: Diagnosis not present

## 2012-09-04 DIAGNOSIS — E119 Type 2 diabetes mellitus without complications: Secondary | ICD-10-CM | POA: Diagnosis not present

## 2012-09-04 DIAGNOSIS — Z471 Aftercare following joint replacement surgery: Secondary | ICD-10-CM | POA: Diagnosis not present

## 2012-09-04 DIAGNOSIS — J45909 Unspecified asthma, uncomplicated: Secondary | ICD-10-CM | POA: Diagnosis not present

## 2012-09-07 DIAGNOSIS — J45909 Unspecified asthma, uncomplicated: Secondary | ICD-10-CM | POA: Diagnosis not present

## 2012-09-07 DIAGNOSIS — I10 Essential (primary) hypertension: Secondary | ICD-10-CM | POA: Diagnosis not present

## 2012-09-07 DIAGNOSIS — IMO0001 Reserved for inherently not codable concepts without codable children: Secondary | ICD-10-CM | POA: Diagnosis not present

## 2012-09-07 DIAGNOSIS — M159 Polyosteoarthritis, unspecified: Secondary | ICD-10-CM | POA: Diagnosis not present

## 2012-09-07 DIAGNOSIS — E119 Type 2 diabetes mellitus without complications: Secondary | ICD-10-CM | POA: Diagnosis not present

## 2012-09-07 DIAGNOSIS — Z471 Aftercare following joint replacement surgery: Secondary | ICD-10-CM | POA: Diagnosis not present

## 2012-09-10 DIAGNOSIS — E119 Type 2 diabetes mellitus without complications: Secondary | ICD-10-CM | POA: Diagnosis not present

## 2012-09-10 DIAGNOSIS — IMO0001 Reserved for inherently not codable concepts without codable children: Secondary | ICD-10-CM | POA: Diagnosis not present

## 2012-09-10 DIAGNOSIS — Z471 Aftercare following joint replacement surgery: Secondary | ICD-10-CM | POA: Diagnosis not present

## 2012-09-10 DIAGNOSIS — J45909 Unspecified asthma, uncomplicated: Secondary | ICD-10-CM | POA: Diagnosis not present

## 2012-09-10 DIAGNOSIS — M159 Polyosteoarthritis, unspecified: Secondary | ICD-10-CM | POA: Diagnosis not present

## 2012-09-10 DIAGNOSIS — I10 Essential (primary) hypertension: Secondary | ICD-10-CM | POA: Diagnosis not present

## 2012-10-15 DIAGNOSIS — Z96649 Presence of unspecified artificial hip joint: Secondary | ICD-10-CM | POA: Diagnosis not present

## 2012-10-21 ENCOUNTER — Ambulatory Visit (INDEPENDENT_AMBULATORY_CARE_PROVIDER_SITE_OTHER): Payer: Medicare Other | Admitting: Nurse Practitioner

## 2012-10-21 ENCOUNTER — Encounter: Payer: Self-pay | Admitting: Nurse Practitioner

## 2012-10-21 VITALS — BP 111/76 | HR 90 | Temp 97.3°F | Ht 65.0 in | Wt 193.0 lb

## 2012-10-21 DIAGNOSIS — Z23 Encounter for immunization: Secondary | ICD-10-CM

## 2012-10-21 DIAGNOSIS — J45909 Unspecified asthma, uncomplicated: Secondary | ICD-10-CM | POA: Diagnosis not present

## 2012-10-21 DIAGNOSIS — E785 Hyperlipidemia, unspecified: Secondary | ICD-10-CM

## 2012-10-21 DIAGNOSIS — I10 Essential (primary) hypertension: Secondary | ICD-10-CM | POA: Diagnosis not present

## 2012-10-21 DIAGNOSIS — Z96649 Presence of unspecified artificial hip joint: Secondary | ICD-10-CM

## 2012-10-21 DIAGNOSIS — E119 Type 2 diabetes mellitus without complications: Secondary | ICD-10-CM

## 2012-10-21 DIAGNOSIS — J4520 Mild intermittent asthma, uncomplicated: Secondary | ICD-10-CM

## 2012-10-21 MED ORDER — FLUTICASONE-SALMETEROL 250-50 MCG/DOSE IN AEPB: 1.0000 | INHALATION_SPRAY | Freq: Two times a day (BID) | RESPIRATORY_TRACT | Status: DC

## 2012-10-21 MED ORDER — TELMISARTAN-HCTZ 80-25 MG PO TABS: 1.0000 | ORAL_TABLET | Freq: Every morning | ORAL | Status: DC

## 2012-10-21 MED ORDER — CELECOXIB 200 MG PO CAPS: 200.0000 mg | ORAL_CAPSULE | Freq: Two times a day (BID) | ORAL | Status: DC

## 2012-10-21 MED ORDER — ROSUVASTATIN CALCIUM 40 MG PO TABS: 40.0000 mg | ORAL_TABLET | Freq: Every day | ORAL | Status: DC

## 2012-10-21 MED ORDER — GLUCOSE BLOOD VI STRP: ORAL_STRIP | Status: DC

## 2012-10-21 MED ORDER — ALBUTEROL SULFATE HFA 108 (90 BASE) MCG/ACT IN AERS: 2.0000 | INHALATION_SPRAY | Freq: Four times a day (QID) | RESPIRATORY_TRACT | Status: DC | PRN

## 2012-10-21 NOTE — Progress Notes (Signed)
Subjective:    Patient ID: Rita Payne, female    DOB: [birth year], 67 y.o.   MRN: [id number]  Hyperlipidemia This is a chronic problem. The current episode started more than 1 year ago. The problem is controlled. Recent lipid tests were reviewed and are normal. Exacerbating diseases include diabetes and obesity. There are no known factors aggravating her hyperlipidemia. Pertinent negatives include no chest pain, focal sensory loss, leg pain, myalgias or shortness of breath. Current antihyperlipidemic treatment includes statins. The current treatment provides significant improvement of lipids. There are no compliance problems.  Risk factors for coronary artery disease include diabetes mellitus and hypertension.  Hypertension This is a chronic problem. The current episode started more than 1 year ago. The problem is unchanged. The problem is controlled. Pertinent negatives include no blurred vision, chest pain, headaches, palpitations, peripheral edema or shortness of breath. There are no associated agents to hypertension. Risk factors for coronary artery disease include diabetes mellitus and dyslipidemia. Past treatments include angiotensin blockers. The current treatment provides significant improvement. There are no compliance problems.   Asthma There is no chest tightness, cough or shortness of breath. This is a chronic problem. The current episode started more than 1 year ago. Pertinent negatives include no chest pain, headaches, myalgias, rhinorrhea or weight loss. Her symptoms are aggravated by nothing. Her symptoms are alleviated by steroid inhaler (Only uses albuterol 1-2 X per month). She reports complete improvement on treatment. Her past medical history is significant for asthma.  Diabetes She presents for her follow-up diabetic visit. She has type 2 diabetes mellitus. No MedicAlert identification noted. Her disease course has been stable. There are no hypoglycemic associated symptoms. Pertinent  negatives for hypoglycemia include no headaches. Pertinent negatives for diabetes include no blurred vision, no chest pain, no polydipsia, no polyphagia, no polyuria, no visual change, no weakness and no weight loss. There are no hypoglycemic complications. Symptoms are stable. There are no diabetic complications. Risk factors for coronary artery disease include dyslipidemia, hypertension, obesity and post-menopausal. Current diabetic treatment includes diet. She is compliant with treatment all of the time. Her weight is stable. She is following a diabetic diet. When asked about meal planning, she reported none. She has not had a previous visit with a dietician. Exercise: Not able to exercise due to hip pain. Suppose to havehip replacement in JUne of this year. Her breakfast blood glucose is taken between 8-9 am. Her breakfast blood glucose range is generally 110-130 mg/dl. An ACE inhibitor/angiotensin II receptor blocker is contraindicated. She does not see a podiatrist.Eye exam is current (May 2013).  Hydradenitis This is a chronic problem. She keeps antibiotic on hand for flare ups. Is currently out. Last flare-up was 3 months ago.    Review of Systems  Constitutional: Negative for weight loss.  HENT: Negative for rhinorrhea.   Eyes: Negative for blurred vision.  Respiratory: Negative for cough and shortness of breath.   Cardiovascular: Negative for chest pain and palpitations.  Endocrine: Negative for polydipsia, polyphagia and polyuria.  Musculoskeletal: Negative for myalgias.  Neurological: Negative for weakness and headaches.  All other systems reviewed and are negative.       Objective:   Physical Exam  Constitutional: She is oriented to person, place, and time. She appears well-developed and well-nourished.  HENT:  Nose: Nose normal.  Mouth/Throat: Oropharynx is clear and moist.  Eyes: EOM are normal.  Neck: Trachea normal, normal range of motion and full passive range of motion  without pain. Neck supple. No  JVD present. Carotid bruit is not present. No thyromegaly present.  Cardiovascular: Normal rate, regular rhythm, normal heart sounds and intact distal pulses.  Exam reveals no gallop and no friction rub.   No murmur heard. Pulmonary/Chest: Effort normal and breath sounds normal.  Abdominal: Soft. Bowel sounds are normal. She exhibits no distension and no mass. There is no tenderness.  Musculoskeletal: Normal range of motion.  Lymphadenopathy:    She has no cervical adenopathy.  Neurological: She is alert and oriented to person, place, and time. She has normal reflexes.  (+) 4/4 monofilament bil feet  Skin: Skin is warm and dry.  No callus formation bil feet  Psychiatric: She has a normal mood and affect. Her behavior is normal. Judgment and thought content normal.   BP 111/76  Pulse 90  Temp(Src) 97.3 F (36.3 C) (Oral)  Ht 5\' 5"  (1.651 m)  Wt 193 lb (87.544 kg)  BMI 32.12 kg/m2  LMP 04/14/1982  Results for orders placed in visit on 10/21/12  POCT GLYCOSYLATED HEMOGLOBIN (HGB A1C)      Result Value Range   Hemoglobin A1C 5.8 %          Assessment & Plan:   1. Diabetes mellitus, controlled Carb counting reviewed - POCT glycosylated hemoglobin (Hb A1C)  2. Hyperlipidemia Low fat diet and exercsie - NMR, lipoprofile - rosuvastatin (CRESTOR) 40 MG tablet; Take 1 tablet (40 mg total) by mouth at bedtime.  Dispense: 90 tablet; Refill: 1 - glucose blood (TRUETEST TEST) test strip; Test 1X per day and prn  Dx 250.02  Dispense: 150 each; Refill: 1  3. Hypertension Low Na+ diet - CMP14+EGFR - aspirin 81 MG tablet; Take 81 mg by mouth daily. - telmisartan-hydrochlorothiazide (MICARDIS HCT) 80-25 MG per tablet; Take 1 tablet by mouth every morning.  Dispense: 90 tablet; Refill: 1  4. Asthma, chronic, mild intermittent, uncomplicated Avoid allergens - albuterol (PROVENTIL HFA;VENTOLIN HFA) 108 (90 BASE) MCG/ACT inhaler; Inhale 2 puffs into the lungs  every 6 (six) hours as needed for wheezing.  Dispense: 3 Inhaler; Refill: 1 - Fluticasone-Salmeterol (ADVAIR) 250-50 MCG/DOSE AEPB; Inhale 1 puff into the lungs every 12 (twelve) hours.  Dispense: 180 each; Refill: 1  5. Status post THR (total hip replacement) keep follow up with specialist. - celecoxib (CELEBREX) 200 MG capsule; Take 1 capsule (200 mg total) by mouth every 12 (twelve) hours.  Dispense: 180 capsule; Refill: 1   Health maintenance reviewed Hemocult cards given Flu shot today  Mary-Margaret Daphine Deutscher, FNP

## 2012-10-21 NOTE — Patient Instructions (Signed)

## 2012-10-23 LAB — NMR, LIPOPROFILE
Cholesterol: 152 mg/dL (ref <200)
HDL Particle Number: 31.5 umol/L (ref >=30.5)
LDL Size: 20 nm — ABNORMAL LOW (ref >20.5)
LP-IR Score: 75 — ABNORMAL HIGH (ref <=45)
Small LDL Particle Number: 1093 nmol/L — ABNORMAL HIGH (ref <=527)

## 2012-10-23 LAB — CMP14+EGFR
ALT: 30 IU/L (ref 0–32)
AST: 31 IU/L (ref 0–40)
Alkaline Phosphatase: 85 IU/L (ref 39–117)
CO2: 21 mmol/L (ref 18–29)
Calcium: 10 mg/dL (ref 8.6–10.2)
Potassium: 4.5 mmol/L (ref 3.5–5.2)
Sodium: 141 mmol/L (ref 134–144)

## 2013-01-05 ENCOUNTER — Telehealth: Payer: Self-pay | Admitting: Nurse Practitioner

## 2013-01-05 NOTE — Telephone Encounter (Signed)
Patient broke tooth and dentist is closed and wont see her til Monday we have no available appts can you send in antibiotic please her.

## 2013-01-06 DIAGNOSIS — K047 Periapical abscess without sinus: Secondary | ICD-10-CM | POA: Diagnosis not present

## 2013-01-06 NOTE — Telephone Encounter (Signed)
Left detailed message with instructions on patient's private voicemail.

## 2013-01-06 NOTE — Telephone Encounter (Signed)
Needs to get from dentist 

## 2013-01-15 DIAGNOSIS — M1610 Unilateral primary osteoarthritis, unspecified hip: Secondary | ICD-10-CM | POA: Diagnosis not present

## 2013-01-15 DIAGNOSIS — M169 Osteoarthritis of hip, unspecified: Secondary | ICD-10-CM | POA: Diagnosis not present

## 2013-01-15 DIAGNOSIS — Z96649 Presence of unspecified artificial hip joint: Secondary | ICD-10-CM | POA: Diagnosis not present

## 2013-01-25 ENCOUNTER — Ambulatory Visit (INDEPENDENT_AMBULATORY_CARE_PROVIDER_SITE_OTHER): Payer: Medicare Other | Admitting: Nurse Practitioner

## 2013-01-25 ENCOUNTER — Encounter: Payer: Self-pay | Admitting: Nurse Practitioner

## 2013-01-25 VITALS — BP 111/77 | HR 95 | Temp 97.2°F | Ht 65.0 in | Wt 196.0 lb

## 2013-01-25 DIAGNOSIS — M7989 Other specified soft tissue disorders: Secondary | ICD-10-CM

## 2013-01-25 DIAGNOSIS — Z9189 Other specified personal risk factors, not elsewhere classified: Secondary | ICD-10-CM | POA: Diagnosis not present

## 2013-01-25 DIAGNOSIS — E119 Type 2 diabetes mellitus without complications: Secondary | ICD-10-CM | POA: Diagnosis not present

## 2013-01-25 DIAGNOSIS — I10 Essential (primary) hypertension: Secondary | ICD-10-CM

## 2013-01-25 DIAGNOSIS — Z9289 Personal history of other medical treatment: Secondary | ICD-10-CM

## 2013-01-25 DIAGNOSIS — E785 Hyperlipidemia, unspecified: Secondary | ICD-10-CM

## 2013-01-25 DIAGNOSIS — M799 Soft tissue disorder, unspecified: Secondary | ICD-10-CM

## 2013-01-25 LAB — POCT GLYCOSYLATED HEMOGLOBIN (HGB A1C): HEMOGLOBIN A1C: 6.1

## 2013-01-25 NOTE — Progress Notes (Signed)
Subjective:    Patient ID: Rita Payne, female    DOB: 08-25-1945, 68 y.o.   MRN: [id number]  Patient here today for followup- no changes since last vist. Patient c/o knot in right upper arm- painful usually at night. Seems to have gotten bigger.   Hyperlipidemia This is a chronic problem. The current episode started more than 1 year ago. The problem is controlled. Recent lipid tests were reviewed and are normal. Exacerbating diseases include diabetes and obesity. There are no known factors aggravating her hyperlipidemia. Pertinent negatives include no chest pain, focal sensory loss, leg pain, myalgias or shortness of breath. Current antihyperlipidemic treatment includes statins. The current treatment provides significant improvement of lipids. There are no compliance problems.  Risk factors for coronary artery disease include diabetes mellitus and hypertension.  Hypertension This is a chronic problem. The current episode started more than 1 year ago. The problem is unchanged. The problem is controlled. Pertinent negatives include no blurred vision, chest pain, headaches, palpitations, peripheral edema or shortness of breath. There are no associated agents to hypertension. Risk factors for coronary artery disease include diabetes mellitus and dyslipidemia. Past treatments include angiotensin blockers. The current treatment provides significant improvement. There are no compliance problems.   Asthma There is no chest tightness, cough or shortness of breath. This is a chronic problem. The current episode started more than 1 year ago. Pertinent negatives include no chest pain, headaches, myalgias, rhinorrhea or weight loss. Her symptoms are aggravated by nothing. Her symptoms are alleviated by steroid inhaler (Only uses albuterol 1-2 X per month). She reports complete improvement on treatment. Her past medical history is significant for asthma.  Diabetes She presents for her follow-up diabetic visit. She  has type 2 diabetes mellitus. No MedicAlert identification noted. Her disease course has been stable. There are no hypoglycemic associated symptoms. Pertinent negatives for hypoglycemia include no headaches. Pertinent negatives for diabetes include no blurred vision, no chest pain, no polydipsia, no polyphagia, no polyuria, no visual change, no weakness and no weight loss. There are no hypoglycemic complications. Symptoms are stable. There are no diabetic complications. Risk factors for coronary artery disease include dyslipidemia, hypertension, obesity and post-menopausal. Current diabetic treatment includes diet. She is compliant with treatment all of the time. Her weight is stable. She is following a diabetic diet. When asked about meal planning, she reported none. She has not had a previous visit with a dietician. Exercise: Not able to exercise due to hip pain. Suppose to havehip replacement in JUne of this year. Her breakfast blood glucose is taken between 8-9 am. Her breakfast blood glucose range is generally 110-130 mg/dl. An ACE inhibitor/angiotensin II receptor blocker is contraindicated. She does not see a podiatrist.Eye exam is current (May 2013).  Hydradenitis This is a chronic problem. She keeps antibiotic on hand for flare ups. Is currently out. Last flare-up was 3 months ago.    Review of Systems  Constitutional: Negative for weight loss.  HENT: Negative for rhinorrhea.   Eyes: Negative for blurred vision.  Respiratory: Negative for cough and shortness of breath.   Cardiovascular: Negative for chest pain and palpitations.  Endocrine: Negative for polydipsia, polyphagia and polyuria.  Musculoskeletal: Negative for myalgias.  Neurological: Negative for weakness and headaches.  All other systems reviewed and are negative.       Objective:   Physical Exam  Constitutional: She is oriented to person, place, and time. She appears well-developed and well-nourished.  HENT:  Nose: Nose  normal.  Mouth/Throat: Oropharynx  is clear and moist.  Eyes: EOM are normal.  Neck: Trachea normal, normal range of motion and full passive range of motion without pain. Neck supple. No JVD present. Carotid bruit is not present. No thyromegaly present.  Cardiovascular: Normal rate, regular rhythm, normal heart sounds and intact distal pulses.  Exam reveals no gallop and no friction rub.   No murmur heard. Pulmonary/Chest: Effort normal and breath sounds normal.  Abdominal: Soft. Bowel sounds are normal. She exhibits no distension and no mass. There is no tenderness.  Musculoskeletal: Normal range of motion.  Tender palpable nodule right upper arm- grips equal bilaterally  Lymphadenopathy:    She has no cervical adenopathy.  Neurological: She is alert and oriented to person, place, and time. She has normal reflexes.  (+) 4/4 monofilament bil feet  Skin: Skin is warm and dry.  No callus formation bil feet  Psychiatric: She has a normal mood and affect. Her behavior is normal. Judgment and thought content normal.   BP 111/77  Pulse 95  Temp(Src) 97.2 F (36.2 C) (Oral)  Ht $R'5\' 5"'nW$  (1.651 m)  Wt 196 lb (88.905 kg)  BMI 32.62 kg/m2  LMP 04/14/1982 Results for orders placed in visit on 01/25/13  POCT GLYCOSYLATED HEMOGLOBIN (HGB A1C)      Result Value Range   Hemoglobin A1C 6.1          Assessment & Plan:   1. Diabetes   2. Hyperlipidemia LDL goal < 100   3. Hypertension   4. H/O mammogram   5. Mass of soft tissue    Orders Placed This Encounter  Procedures  . MM Digital Screening    Standing Status: Future     Number of Occurrences:      Standing Expiration Date: 03/26/2014    Order Specific Question:  Reason for Exam (SYMPTOM  OR DIAGNOSIS REQUIRED)    Answer:  screning    Order Specific Question:  Preferred imaging location?    Answer:  External     Comments:  [facility]  . [place] Misc Soft Tissue    Standing Status: Future     Number of Occurrences:      Standing  Expiration Date: 03/26/2014    Order Specific Question:  Reason for Exam (SYMPTOM  OR DIAGNOSIS REQUIRED)    Answer:  right upper arm mass    Order Specific Question:  Preferred imaging location?    Answer:  [place]  . NMR, lipoprofile  . POCT glycosylated hemoglobin (Hb A1C)   Hemoccult cards given Labs pending Health maintenance reviewed Diet and exercise encouraged Continue all meds Follow up  In 3 months   [place], FNP

## 2013-01-25 NOTE — Patient Instructions (Signed)
Diabetes and Foot Care Diabetes may cause you to have problems because of poor blood supply (circulation) to your feet and legs. This may cause the skin on your feet to become thinner, break easier, and heal more slowly. Your skin may become dry, and the skin may peel and crack. You may also have nerve damage in your legs and feet causing decreased feeling in them. You may not notice minor injuries to your feet that could lead to infections or more serious problems. Taking care of your feet is one of the most important things you can do for yourself.  HOME CARE INSTRUCTIONS  Wear shoes at all times, even in the house. Do not go barefoot. Bare feet are easily injured.  Check your feet daily for blisters, cuts, and redness. If you cannot see the bottom of your feet, use a mirror or ask someone for help.  Wash your feet with warm water (do not use hot water) and mild soap. Then pat your feet and the areas between your toes until they are completely dry. Do not soak your feet as this can dry your skin.  Apply a moisturizing lotion or petroleum jelly (that does not contain alcohol and is unscented) to the skin on your feet and to dry, brittle toenails. Do not apply lotion between your toes.  Trim your toenails straight across. Do not dig under them or around the cuticle. File the edges of your nails with an emery board or nail file.  Do not cut corns or calluses or try to remove them with medicine.  Wear clean socks or stockings every day. Make sure they are not too tight. Do not wear knee-high stockings since they may decrease blood flow to your legs.  Wear shoes that fit properly and have enough cushioning. To break in new shoes, wear them for just a few hours a day. This prevents you from injuring your feet. Always look in your shoes before you put them on to be sure there are no objects inside.  Do not cross your legs. This may decrease the blood flow to your feet.  If you find a minor scrape,  cut, or break in the skin on your feet, keep it and the skin around it clean and dry. These areas may be cleansed with mild soap and water. Do not cleanse the area with peroxide, alcohol, or iodine.  When you remove an adhesive bandage, be sure not to damage the skin around it.  If you have a wound, look at it several times a day to make sure it is healing.  Do not use heating pads or hot water bottles. They may burn your skin. If you have lost feeling in your feet or legs, you may not know it is happening until it is too late.  Make sure your health care provider performs a complete foot exam at least annually or more often if you have foot problems. Report any cuts, sores, or bruises to your health care provider immediately. SEEK MEDICAL CARE IF:   You have an injury that is not healing.  You have cuts or breaks in the skin.  You have an ingrown nail.  You notice redness on your legs or feet.  You feel burning or tingling in your legs or feet.  You have pain or cramps in your legs and feet.  Your legs or feet are numb.  Your feet always feel cold. SEEK IMMEDIATE MEDICAL CARE IF:   There is increasing redness,   swelling, or pain in or around a wound.  There is a red line that goes up your leg.  Pus is coming from a wound.  You develop a fever or as directed by your health care provider.  You notice a bad smell coming from an ulcer or wound. Document Released: 12/22/1999 Document Revised: 08/26/2012 Document Reviewed: 06/02/2012 [employer] Patient Information 2014 [employer].  

## 2013-01-27 LAB — NMR, LIPOPROFILE
Cholesterol: 170 mg/dL (ref <200)
HDL CHOLESTEROL BY NMR: 37 mg/dL — AB (ref >=40)
HDL PARTICLE NUMBER: 28.2 umol/L — AB (ref >=30.5)
LDL Particle Number: 1647 nmol/L — ABNORMAL HIGH (ref <1000)
LDL Size: 20.1 nm — ABNORMAL LOW (ref >20.5)
LDLC SERPL CALC-MCNC: 71 mg/dL (ref <100)
LP-IR Score: 87 — ABNORMAL HIGH (ref <=45)
SMALL LDL PARTICLE NUMBER: 1060 nmol/L — AB (ref <=527)
TRIGLYCERIDES BY NMR: 309 mg/dL — AB (ref <150)

## 2013-01-27 LAB — CMP14+EGFR
A/G RATIO: 1.7 (ref 1.1–2.5)
ALT: 29 IU/L (ref 0–32)
AST: 24 IU/L (ref 0–40)
Albumin: 4.6 g/dL (ref 3.6–4.8)
Alkaline Phosphatase: 87 IU/L (ref 39–117)
BILIRUBIN TOTAL: 0.5 mg/dL (ref 0.0–1.2)
BUN/Creatinine Ratio: 16 (ref 11–26)
BUN: 12 mg/dL (ref 8–27)
CALCIUM: 9.6 mg/dL (ref 8.7–10.3)
CO2: 23 mmol/L (ref 18–29)
CREATININE: 0.73 mg/dL (ref 0.57–1.00)
Chloride: 99 mmol/L (ref 97–108)
GFR, EST AFRICAN AMERICAN: 100 mL/min/{1.73_m2} (ref >59)
GFR, EST NON AFRICAN AMERICAN: 87 mL/min/{1.73_m2} (ref >59)
GLOBULIN, TOTAL: 2.7 g/dL (ref 1.5–4.5)
Glucose: 114 mg/dL — ABNORMAL HIGH (ref 65–99)
Potassium: 4.2 mmol/L (ref 3.5–5.2)
SODIUM: 141 mmol/L (ref 134–144)
Total Protein: 7.3 g/dL (ref 6.0–8.5)

## 2013-02-03 ENCOUNTER — Ambulatory Visit (HOSPITAL_COMMUNITY)
Admission: RE | Admit: 2013-02-03 | Discharge: 2013-02-03 | Disposition: A | Discharge status: Home / Self Care | Payer: Medicare Other | Source: Ambulatory Visit | Attending: Nurse Practitioner | Admitting: Nurse Practitioner

## 2013-02-03 ENCOUNTER — Other Ambulatory Visit: Payer: Self-pay | Admitting: Nurse Practitioner

## 2013-02-03 DIAGNOSIS — R229 Localized swelling, mass and lump, unspecified: Secondary | ICD-10-CM | POA: Diagnosis not present

## 2013-02-03 DIAGNOSIS — M7989 Other specified soft tissue disorders: Secondary | ICD-10-CM

## 2013-02-03 DIAGNOSIS — R29898 Other symptoms and signs involving the musculoskeletal system: Secondary | ICD-10-CM | POA: Diagnosis not present

## 2013-04-15 ENCOUNTER — Other Ambulatory Visit: Payer: Self-pay | Admitting: Nurse Practitioner

## 2013-04-19 DIAGNOSIS — H5210 Myopia, unspecified eye: Secondary | ICD-10-CM | POA: Diagnosis not present

## 2013-04-19 DIAGNOSIS — H2510 Age-related nuclear cataract, unspecified eye: Secondary | ICD-10-CM | POA: Diagnosis not present

## 2013-04-19 DIAGNOSIS — E119 Type 2 diabetes mellitus without complications: Secondary | ICD-10-CM | POA: Diagnosis not present

## 2013-04-19 DIAGNOSIS — H52229 Regular astigmatism, unspecified eye: Secondary | ICD-10-CM | POA: Diagnosis not present

## 2013-04-26 ENCOUNTER — Ambulatory Visit (INDEPENDENT_AMBULATORY_CARE_PROVIDER_SITE_OTHER): Payer: Medicare Other | Admitting: Nurse Practitioner

## 2013-04-26 ENCOUNTER — Encounter: Payer: Self-pay | Admitting: Nurse Practitioner

## 2013-04-26 VITALS — BP 129/84 | HR 89 | Temp 97.9°F | Ht 65.0 in | Wt 206.0 lb

## 2013-04-26 DIAGNOSIS — I10 Essential (primary) hypertension: Secondary | ICD-10-CM | POA: Diagnosis not present

## 2013-04-26 DIAGNOSIS — M129 Arthropathy, unspecified: Secondary | ICD-10-CM

## 2013-04-26 DIAGNOSIS — J45909 Unspecified asthma, uncomplicated: Secondary | ICD-10-CM

## 2013-04-26 DIAGNOSIS — E119 Type 2 diabetes mellitus without complications: Secondary | ICD-10-CM

## 2013-04-26 DIAGNOSIS — Z13820 Encounter for screening for osteoporosis: Secondary | ICD-10-CM

## 2013-04-26 DIAGNOSIS — E785 Hyperlipidemia, unspecified: Secondary | ICD-10-CM | POA: Diagnosis not present

## 2013-04-26 DIAGNOSIS — Z713 Dietary counseling and surveillance: Secondary | ICD-10-CM

## 2013-04-26 DIAGNOSIS — Z6834 Body mass index (BMI) 34.0-34.9, adult: Secondary | ICD-10-CM

## 2013-04-26 DIAGNOSIS — M1990 Unspecified osteoarthritis, unspecified site: Secondary | ICD-10-CM

## 2013-04-26 DIAGNOSIS — E669 Obesity, unspecified: Secondary | ICD-10-CM

## 2013-04-26 LAB — POCT GLYCOSYLATED HEMOGLOBIN (HGB A1C): HEMOGLOBIN A1C: 6.7

## 2013-04-26 MED ORDER — FLUTICASONE-SALMETEROL 250-50 MCG/DOSE IN AEPB: 1.0000 | INHALATION_SPRAY | Freq: Two times a day (BID) | RESPIRATORY_TRACT | Status: DC

## 2013-04-26 MED ORDER — CELECOXIB 200 MG PO CAPS: ORAL_CAPSULE | ORAL | Status: DC

## 2013-04-26 MED ORDER — TELMISARTAN-HCTZ 80-25 MG PO TABS: 1.0000 | ORAL_TABLET | Freq: Every morning | ORAL | Status: DC

## 2013-04-26 MED ORDER — GLUCOSE BLOOD VI STRP: ORAL_STRIP | Status: DC

## 2013-04-26 NOTE — Patient Instructions (Signed)
How to Avoid Diabetes Problems You can do a lot to prevent or slow down diabetes problems. Following your diabetes plan and taking care of yourself can reduce your risk of serious or life-threatening complications. Below, you will find certain things you can do to prevent diabetes problems. MANAGE YOUR DIABETES Follow your caregiver's, nurse educator's, and dietitian's instructions for managing your diabetes. They will teach you the basics of diabetes care. They can help answer questions you may have. Learn about diabetes and make healthy choices regarding eating and physical activity. Monitor your blood glucose level regularly. Your caregiver will help you decide how often to check your blood glucose level depending on your treatment goals and how well you are meeting them.  DO NOT SMOKE Smoking and diabetes are a dangerous combination. Smoking raises your risk for diabetes problems. If you quit smoking, you will lower your risk for heart attack, stroke, nerve disease, and kidney disease. Your cholesterol and your blood pressure levels may improve. Your blood circulation will also improve. If you smoke, ask your caregiver for help in quitting. KEEP YOUR BLOOD PRESSURE UNDER CONTROL Keeping your blood pressure under control will help prevent damage to your eyes, kidneys, heart, and blood vessels. Blood pressure consists of two numbers. The top number should be below 120, and the bottom number should be below 80 (120/80). Keep your blood pressure as close to these numbers as you can. If you already have kidney disease, you may want even lower blood pressure to protect your kidneys. Talk to your caregiver to make sure that your blood pressure goal is right for your needs. Meal planning, medicines, and exercise can help you reach your blood pressure target. Have your blood pressure checked at every visit with your caregiver. KEEP YOUR CHOLESTEROL UNDER CONTROL Normal cholesterol levels will help prevent heart  disease and stroke. These are the biggest health problems for people with diabetes. Keeping cholesterol levels under control can also help with blood flow. Have your cholesterol level checked at least once a year. Meal planning, exercise, and medicines can help you reach your cholesterol targets. SCHEDULE AND KEEP YOUR ANNUAL PHYSICAL EXAMS AND EYE EXAMS Your caregiver will tell you how often he or she wants to see you depending on your plan of treatment. It is important that you keep these appointments so that possible problems can be identified early and complications can be avoided or treated.  Every visit with your caregiver should include your weight, blood pressure, and an evaluation of your blood glucose control.  Your hemoglobin A1c should be checked:  At least twice a year if you are at your goal.  Every 3 months if there are changes in treatment.  If you are not meeting your goals.  Your blood lipids should be checked yearly. You should also be checked yearly to see if you have protein in your urine (microalbumin).  Schedule a dilated eye exam if you have type 1 diabetes within 5 years of your diagnosis and then yearly. Schedule a dilated eye exam if you have type 2 diabetes at diagnosis and then yearly. All exams thereafter can be extended to every 2 to 3 years if one or more exams have been normal. KEEP YOUR VACCINES CURRENT The flu vaccine is recommended yearly. The formula for the vaccine changes every year and needs to be updated for the best protection against current viruses. In addition, you should get a vaccination against pneumonia at least once in your life. However, there are some  instances where another vaccine is recommended. Check with your caregiver. TAKE CARE OF YOUR FEET  Diabetes may cause you to have a poor blood supply (circulation) to your legs and feet. Because of this, the skin may be thinner, break easier, and heal more slowly. You also may have nerve damage in  your legs and feet causing decreased feeling. You may not notice minor injuries to your feet that could lead to serious problems or infections. Taking care of your feet is very important. Visual foot exams are performed at every routine medical visit. The exams check for cuts, injuries, or other problems with the feet. A comprehensive foot exam should be done yearly. This includes visual inspection as well as assessing foot pulses and testing for loss of sensation. You should also do the following:  Inspect your feet daily for cuts, calluses, blisters, ingrown toenails, and signs of infection, such as redness, swelling, or pus.  Wash and dry your feet thoroughly, especially between the toes.  Avoid soaking your feet regularly in hot water baths.  Moisturize dry skin with lotion, avoiding areas between your toes.  Cut toenails straight across and file the edges.  Avoid shoes that do not fit well or have areas that irritate your skin.  Avoid going barefooted or wearing only socks. Your feet need protection. TAKE CARE OF YOUR TEETH People with poorly controlled diabetes are more likely to have gum (periodontal) disease. These infections make diabetes harder to control. Periodontal diseases, if left untreated, can lead to tooth loss. Brush your teeth twice a day, floss, and see your dentist for checkups and cleaning every 6 months, or 2 times a year. ASK YOUR CAREGIVER ABOUT TAKING ASPIRIN Taking aspirin daily is recommended to help prevent cardiovascular disease in people with and without diabetes. Ask your caregiver if this would benefit you and what dose he or she would recommend. DRINK RESPONSIBLY Moderate amounts of alcohol (less than 1 drink per day for adult women and less than 2 drinks per day for adult men) have a minimal effect on blood glucose if ingested with food. It is important to eat food with alcohol to avoid hypoglycemia. People should avoid alcohol if they have a history of  alcohol abuse or dependence, if they are pregnant, and if they have liver disease, pancreatitis, advanced neuropathy, or severe hypertriglyceridemia. LESSEN STRESS Living with diabetes can be stressful. When you are under stress, your blood glucose may be affected in two ways:  Stress hormones may cause your blood glucose to rise.  You may be distracted from taking good care of yourself. It is a good idea to be aware of your stress level and make changes that are necessary to help you better manage challenging situations. Support groups, planned relaxation, a hobby you enjoy, meditation, healthy relationships, and exercise all work to lower your stress level. If your efforts do not seem to be helping, get help from your caregiver or a trained mental health professional. Document Released: 09/11/2010 Document Revised: 12/11/2011 Document Reviewed: 09/11/2010 [facility] Patient Information 2014 [employer].

## 2013-04-26 NOTE — Progress Notes (Signed)
Subjective:    Patient ID: Rita Payne, female    DOB: 09-19-1945, 68 y.o.   MRN: [id number]  Patient here today for followup- no changes since last vist.    Hyperlipidemia This is a chronic problem. The current episode started more than 1 year ago. The problem is controlled. Recent lipid tests were reviewed and are normal. Exacerbating diseases include diabetes and obesity. There are no known factors aggravating her hyperlipidemia. Pertinent negatives include no chest pain, focal sensory loss, leg pain, myalgias or shortness of breath. Current antihyperlipidemic treatment includes statins. The current treatment provides significant improvement of lipids. There are no compliance problems.  Risk factors for coronary artery disease include diabetes mellitus and hypertension.  Hypertension This is a chronic problem. The current episode started more than 1 year ago. The problem is unchanged. The problem is controlled. Pertinent negatives include no blurred vision, chest pain, headaches, palpitations, peripheral edema or shortness of breath. There are no associated agents to hypertension. Risk factors for coronary artery disease include diabetes mellitus and dyslipidemia. Past treatments include angiotensin blockers. The current treatment provides significant improvement. There are no compliance problems.   Asthma There is no chest tightness, cough or shortness of breath. This is a chronic problem. The current episode started more than 1 year ago. Pertinent negatives include no chest pain, headaches, myalgias, rhinorrhea or weight loss. Her symptoms are aggravated by nothing. Her symptoms are alleviated by steroid inhaler (Only uses albuterol 1-2 X per month). She reports complete improvement on treatment. Her past medical history is significant for asthma.  Diabetes She presents for her follow-up diabetic visit. She has type 2 diabetes mellitus. No MedicAlert identification noted. Her disease course has  been stable. There are no hypoglycemic associated symptoms. Pertinent negatives for hypoglycemia include no headaches. Pertinent negatives for diabetes include no blurred vision, no chest pain, no polydipsia, no polyphagia, no polyuria, no visual change, no weakness and no weight loss. There are no hypoglycemic complications. Symptoms are stable. There are no diabetic complications. Risk factors for coronary artery disease include dyslipidemia, hypertension, obesity and post-menopausal. Current diabetic treatment includes diet. She is compliant with treatment all of the time. Her weight is stable. She is following a diabetic diet. When asked about meal planning, she reported none. She has not had a previous visit with a dietician. Exercise: Not able to exercise due to hip pain. Suppose to havehip replacement in JUne of this year. Her breakfast blood glucose is taken between 8-9 am. Her breakfast blood glucose range is generally 110-130 mg/dl. An ACE inhibitor/angiotensin II receptor blocker is contraindicated. She does not see a podiatrist.Eye exam is current (May 2013).  Hydradenitis This is a chronic problem. She keeps antibiotic on hand for flare ups. Is currently out. Last flare-up was 3 months ago.    Review of Systems  Constitutional: Negative for weight loss.  HENT: Negative for rhinorrhea.   Eyes: Negative for blurred vision.  Respiratory: Negative for cough and shortness of breath.   Cardiovascular: Negative for chest pain and palpitations.  Endocrine: Negative for polydipsia, polyphagia and polyuria.  Musculoskeletal: Negative for myalgias.  Neurological: Negative for weakness and headaches.  All other systems reviewed and are negative.      Objective:   Physical Exam  Constitutional: She is oriented to person, place, and time. She appears well-developed and well-nourished.  HENT:  Nose: Nose normal.  Mouth/Throat: Oropharynx is clear and moist.  Eyes: EOM are normal.  Neck:  Trachea normal, normal range of  motion and full passive range of motion without pain. Neck supple. No JVD present. Carotid bruit is not present. No thyromegaly present.  Cardiovascular: Normal rate, regular rhythm, normal heart sounds and intact distal pulses.  Exam reveals no gallop and no friction rub.   No murmur heard. Pulmonary/Chest: Effort normal and breath sounds normal.  Abdominal: Soft. Bowel sounds are normal. She exhibits no distension and no mass. There is no tenderness.  Musculoskeletal: Normal range of motion.  Tender palpable nodule right upper arm- grips equal bilaterally  Lymphadenopathy:    She has no cervical adenopathy.  Neurological: She is alert and oriented to person, place, and time. She has normal reflexes.  (+) 4/4 monofilament bil feet  Skin: Skin is warm and dry.  No callus formation bil feet  Psychiatric: She has a normal mood and affect. Her behavior is normal. Judgment and thought content normal.   BP 129/84  Pulse 89  Temp(Src) 97.9 F (36.6 C) (Oral)  Ht 5\' 5"  (1.651 m)  Wt 206 lb (93.441 kg)  BMI 34.28 kg/m2  LMP 04/14/1982 Results for orders placed in visit on 04/26/13  POCT GLYCOSYLATED HEMOGLOBIN (HGB A1C)      Result Value Ref Range   Hemoglobin A1C 6.7          Assessment & Plan:   1. Diabetes   2. Hyperlipidemia LDL goal < 100   3. Hypertension   4. Asthma, chronic   5. Obese   6. Hyperlipidemia   7. Screening for osteoporosis   8. Arthritis    Orders Placed This Encounter  Procedures  . DG Bone Density    Standing Status: Future     Number of Occurrences:      Standing Expiration Date: 06/26/2014    Order Specific Question:  Reason for Exam (SYMPTOM  OR DIAGNOSIS REQUIRED)    Answer:  screening    Order Specific Question:  Preferred imaging location?    Answer:  Internal  . CMP14+EGFR  . NMR, lipoprofile  . POCT glycosylated hemoglobin (Hb A1C)   Meds ordered this encounter  Medications  . loratadine (CLARITIN) 10 MG  tablet    Sig: Take 10 mg by mouth daily.  . Fluticasone-Salmeterol (ADVAIR) 250-50 MCG/DOSE AEPB    Sig: Inhale 1 puff into the lungs every 12 (twelve) hours.    Dispense:  180 each    Refill:  1    Order Specific Question:  Supervising Provider    Answer:  06/28/2014 [[id number]]  . glucose blood (TRUETEST TEST) test strip    Sig: Test 1X per day and prn  Dx 250.02    Dispense:  300 each    Refill:  1    Order Specific Question:  Supervising Provider    Answer:  Ernestina Penna [[id number]]  . telmisartan-hydrochlorothiazide (MICARDIS HCT) 80-25 MG per tablet    Sig: Take 1 tablet by mouth every morning.    Dispense:  90 tablet    Refill:  1    Order Specific Question:  Supervising Provider    Answer:  Ernestina Penna [[id number]]  . celecoxib (CELEBREX) 200 MG capsule    Sig: TAKE 1 CAPSULE EVERY 12 HOURS    Dispense:  180 capsule    Refill:  1    Order Specific Question:  Supervising Provider    Answer:  Ernestina Penna [[id number]]    Labs pending Health maintenance reviewed Diet and exercise encouraged Continue all meds Follow up  In  3 months   [place], FNP

## 2013-04-27 LAB — CMP14+EGFR
ALT: 50 IU/L — ABNORMAL HIGH (ref 0–32)
AST: 45 IU/L — AB (ref 0–40)
Albumin/Globulin Ratio: 2.3 (ref 1.1–2.5)
Albumin: 4.5 g/dL (ref 3.6–4.8)
Alkaline Phosphatase: 89 IU/L (ref 39–117)
BUN/Creatinine Ratio: 15 (ref 11–26)
BUN: 11 mg/dL (ref 8–27)
CALCIUM: 9.4 mg/dL (ref 8.7–10.3)
CO2: 24 mmol/L (ref 18–29)
CREATININE: 0.75 mg/dL (ref 0.57–1.00)
Chloride: 100 mmol/L (ref 97–108)
GFR calc Af Amer: 97 mL/min/{1.73_m2} (ref >59)
GFR calc non Af Amer: 84 mL/min/{1.73_m2} (ref >59)
GLOBULIN, TOTAL: 2 g/dL (ref 1.5–4.5)
GLUCOSE: 151 mg/dL — AB (ref 65–99)
Potassium: 4.6 mmol/L (ref 3.5–5.2)
SODIUM: 140 mmol/L (ref 134–144)
TOTAL PROTEIN: 6.5 g/dL (ref 6.0–8.5)
Total Bilirubin: 0.4 mg/dL (ref 0.0–1.2)

## 2013-04-27 LAB — NMR, LIPOPROFILE
Cholesterol: 132 mg/dL (ref <200)
HDL CHOLESTEROL BY NMR: 40 mg/dL (ref >=40)
HDL PARTICLE NUMBER: 32 umol/L (ref >=30.5)
LDL Particle Number: 1009 nmol/L — ABNORMAL HIGH (ref <1000)
LDL Size: 20.4 nm (ref >20.5)
LDLC SERPL CALC-MCNC: 58 mg/dL (ref <100)
LP-IR Score: 61 — ABNORMAL HIGH (ref <=45)
Small LDL Particle Number: 607 nmol/L — ABNORMAL HIGH (ref <=527)
TRIGLYCERIDES BY NMR: 169 mg/dL — AB (ref <150)

## 2013-05-04 ENCOUNTER — Other Ambulatory Visit: Payer: Self-pay | Admitting: Nurse Practitioner

## 2013-05-12 ENCOUNTER — Encounter: Payer: Self-pay | Admitting: *Deleted

## 2013-05-12 NOTE — Progress Notes (Signed)
Quick Note:  Copy of labs sent to patient ______ 

## 2013-05-19 ENCOUNTER — Other Ambulatory Visit: Payer: Self-pay | Admitting: Nurse Practitioner

## 2013-06-23 ENCOUNTER — Ambulatory Visit (INDEPENDENT_AMBULATORY_CARE_PROVIDER_SITE_OTHER): Payer: Medicare Other | Admitting: Pharmacist

## 2013-06-23 ENCOUNTER — Ambulatory Visit (INDEPENDENT_AMBULATORY_CARE_PROVIDER_SITE_OTHER): Payer: Medicare Other

## 2013-06-23 VITALS — Ht 64.0 in | Wt 198.0 lb

## 2013-06-23 DIAGNOSIS — M810 Age-related osteoporosis without current pathological fracture: Secondary | ICD-10-CM

## 2013-06-23 DIAGNOSIS — Z13820 Encounter for screening for osteoporosis: Secondary | ICD-10-CM

## 2013-06-23 DIAGNOSIS — F17200 Nicotine dependence, unspecified, uncomplicated: Secondary | ICD-10-CM | POA: Diagnosis not present

## 2013-06-23 LAB — HM DEXA SCAN: HM Dexa Scan: NORMAL

## 2013-06-23 MED ORDER — NICOTINE 21 MG/24HR TD PT24: 21.0000 mg | MEDICATED_PATCH | Freq: Every day | TRANSDERMAL | Status: DC

## 2013-06-23 MED ORDER — NICOTINE 14 MG/24HR TD PT24: 14.0000 mg | MEDICATED_PATCH | Freq: Every day | TRANSDERMAL | Status: DC

## 2013-06-23 NOTE — Patient Instructions (Signed)
Smoking Cessation, Tips for Success If you are ready to quit smoking, congratulations! You have chosen to help yourself be healthier. Cigarettes bring nicotine, tar, carbon monoxide, and other irritants into your body. Your lungs, heart, and blood vessels will be able to work better without these poisons. There are many different ways to quit smoking. Nicotine gum, nicotine patches, a nicotine inhaler, or nicotine nasal spray can help with physical craving. Hypnosis, support groups, and medicines help break the habit of smoking. WHAT THINGS CAN I DO TO MAKE QUITTING EASIER?  Here are some tips to help you quit for good:  Pick a date when you will quit smoking completely. Tell all of your friends and family about your plan to quit on that date.  Do not try to slowly cut down on the number of cigarettes you are smoking. Pick a quit date and quit smoking completely starting on that day.  Throw away all cigarettes.   Clean and remove all ashtrays from your home, work, and car.   On a card, write down your reasons for quitting. Carry the card with you and read it when you get the urge to smoke.   Cleanse your body of nicotine. Drink enough water and fluids to keep your urine clear or pale yellow. Do this after quitting to flush the nicotine from your body.   Learn to predict your moods. Do not let a bad situation be your excuse to have a cigarette. Some situations in your life might tempt you into wanting a cigarette.   Never have "just one" cigarette. It leads to wanting another and another. Remind yourself of your decision to quit.   Change habits associated with smoking. If you smoked while driving or when feeling stressed, try other activities to replace smoking. Stand up when drinking your coffee. Brush your teeth after eating. Sit in a different chair when you read the paper. Avoid alcohol while trying to quit, and try to drink fewer caffeinated beverages. Alcohol and caffeine may urge  you to smoke.   Avoid foods and drinks that can trigger a desire to smoke, such as sugary or spicy foods and alcohol.   Ask people who smoke not to smoke around you.   Have something planned to do right after eating or having a cup of coffee. For example, plan to take a walk or exercise.   Try a relaxation exercise to calm you down and decrease your stress. Remember, you may be tense and nervous for the first 2 weeks after you quit, but this will pass.   Find new activities to keep your hands busy. Play with a pen, coin, or rubber band. Doodle or draw things on paper.   Brush your teeth right after eating. This will help cut down on the craving for the taste of tobacco after meals. You can also try mouthwash.   Use oral substitutes in place of cigarettes. Try using lemon drops, carrots, cinnamon sticks, or chewing gum. Keep them handy so they are available when you have the urge to smoke.   When you have the urge to smoke, try deep breathing.   Designate your home as a nonsmoking area.   If you are a heavy smoker, ask your health care provider about a prescription for nicotine chewing gum. It can ease your withdrawal from nicotine.   Reward yourself. Set aside the cigarette money you save and buy yourself something nice.   Look for support from others. Join a support group or  smoking cessation program. Ask someone at home or at work to help you with your plan to quit smoking.   Always ask yourself, "Do I need this cigarette or is this just a reflex?" Tell yourself, "Today, I choose not to smoke," or "I do not want to smoke." You are reminding yourself of your decision to quit.  Do not replace cigarette smoking with electronic cigarettes (commonly called e-cigarettes). The safety of e-cigarettes is unknown, and some may contain harmful chemicals.  If you relapse, do not give up! Plan ahead and think about what you will do the next time you get the urge to smoke.  HOW WILL  I FEEL WHEN I QUIT SMOKING? You may have symptoms of withdrawal because your body is used to nicotine (the addictive substance in cigarettes). You may crave cigarettes, be irritable, feel very hungry, cough often, get headaches, or have difficulty concentrating. The withdrawal symptoms are only temporary. They are strongest when you first quit but will go away within 10-14 days. When withdrawal symptoms occur, stay in control. Think about your reasons for quitting. Remind yourself that these are signs that your body is healing and getting used to being without cigarettes. Remember that withdrawal symptoms are easier to treat than the major diseases that smoking can cause.  Even after the withdrawal is over, expect periodic urges to smoke. However, these cravings are generally short lived and will go away whether you smoke or not. Do not smoke!  WHAT RESOURCES ARE AVAILABLE TO HELP ME QUIT SMOKING? Your health care provider can direct you to community resources or hospitals for support, which may include:  Group support.  Education.  Hypnosis.  Therapy. Document Released: 09/22/2003 Document Revised: 10/14/2012 Document Reviewed: 06/11/2012 [facility] Patient Information 2015 [place], [place]. This information is not intended to replace advice given to you by your health care provider. Make sure you discuss any questions you have with your health care provider.               Exercise for Strong Bones  Exercise is important to build and maintain strong bones / bone density.  There are 2 types of exercises that are important to building and maintaining strong bones:  Weight- bearing and muscle-stregthening.  Weight-bearing Exercises  These exercises include activities that make you move against gravity while staying upright. Weight-bearing exercises can be high-impact or low-impact.  High-impact weight-bearing exercises help build bones and keep them strong. If you have broken a bone due to  osteoporosis or are at risk of breaking a bone, you may need to avoid high-impact exercises. If you're not sure, you should check with your healthcare provider.  Examples of high-impact weight-bearing exercises are: Dancing  Doing high-impact aerobics  Hiking  Jogging/running  Jumping Rope  Stair climbing  Tennis  Low-impact weight-bearing exercises can also help keep bones strong and are a safe alternative if you cannot do high-impact exercises.   Examples of low-impact weight-bearing exercises are: Using elliptical training machines  Doing low-impact aerobics  Using stair-step machines  Fast walking on a treadmill or outside   Muscle-Strengthening Exercises These exercises include activities where you move your body, a weight or some other resistance against gravity. They are also known as resistance exercises and include: Lifting weights  Using elastic exercise bands  Using weight machines  Lifting your own body weight  Functional movements, such as standing and rising up on your toes  Yoga and Pilates can also improve strength, balance and flexibility. However, certain  positions may not be safe for people with osteoporosis or those at increased risk of broken bones. For example, exercises that have you bend forward may increase the chance of breaking a bone in the spine.   Non-Impact Exercises There are other types of exercises that can help prevent falls.  Non-impact exercises can help you to improve balance, posture and how well you move in everyday activities. Some of these exercises include: Balance exercises that strengthen your legs and test your balance, such as Tai Chi, can decrease your risk of falls.  Posture exercises that improve your posture and reduce rounded or "sloping" shoulders can help you decrease the chance of breaking a bone, especially in the spine.  Functional exercises that improve how well you move can help you with everyday activities and decrease your  chance of falling and breaking a bone. For example, if you have trouble getting up from a chair or climbing stairs, you should do these activities as exercises.   **A physical therapist can teach you balance, posture and functional exercises. He/she can also help you learn which exercises are safe and appropriate for you.  [facility] has a physical therapy office in [place] in front of our office and referrals can be made for assessments and treatment as needed and strength and balance training.  If you would like to have an assessment with [facility] and our physical therapy team please let a nurse or provider know.   Fall Prevention and Home Safety Falls cause injuries and can affect all age groups. It is possible to use preventive measures to significantly decrease the likelihood of falls. There are many simple measures which can make your home safer and prevent falls. OUTDOORS  Repair cracks and edges of walkways and driveways.  Remove high doorway thresholds.  Trim shrubbery on the main path into your home.  Have good outside lighting.  Clear walkways of tools, rocks, debris, and clutter.  Check that handrails are not broken and are securely fastened. Both sides of steps should have handrails.  Have leaves, snow, and ice cleared regularly.  Use sand or salt on walkways during winter months.  In the garage, clean up grease or oil spills. BATHROOM  Install night lights.  Install grab bars by the toilet and in the tub and shower.  Use non-skid mats or decals in the tub or shower.  Place a plastic non-slip stool in the shower to sit on, if needed.  Keep floors dry and clean up all water on the floor immediately.  Remove soap buildup in the tub or shower on a regular basis.  Secure bath mats with non-slip, double-sided rug tape.  Remove throw rugs and tripping hazards from the floors. BEDROOMS  Install night lights.  Make sure a bedside light is easy to reach.  Do not  use oversized bedding.  Keep a telephone by your bedside.  Have a firm chair with side arms to use for getting dressed.  Remove throw rugs and tripping hazards from the floor. KITCHEN  Keep handles on pots and pans turned toward the center of the stove. Use back burners when possible.  Clean up spills quickly and allow time for drying.  Avoid walking on wet floors.  Avoid hot utensils and knives.  Position shelves so they are not too high or low.  Place commonly used objects within easy reach.  If necessary, use a sturdy step stool with a grab bar when reaching.  Keep electrical cables out of the  way.  Do not use floor polish or wax that makes floors slippery. If you must use wax, use non-skid floor wax.  Remove throw rugs and tripping hazards from the floor. STAIRWAYS  Never leave objects on stairs.  Place handrails on both sides of stairways and use them. Fix any loose handrails. Make sure handrails on both sides of the stairways are as long as the stairs.  Check carpeting to make sure it is firmly attached along stairs. Make repairs to worn or loose carpet promptly.  Avoid placing throw rugs at the top or bottom of stairways, or properly secure the rug with carpet tape to prevent slippage. Get rid of throw rugs, if possible.  Have an electrician put in a light switch at the top and bottom of the stairs. OTHER FALL PREVENTION TIPS  Wear low-heel or rubber-soled shoes that are supportive and fit well. Wear closed toe shoes.  When using a stepladder, make sure it is fully opened and both spreaders are firmly locked. Do not climb a closed stepladder.  Add color or contrast paint or tape to grab bars and handrails in your home. Place contrasting color strips on first and last steps.  Learn and use mobility aids as needed. Install an electrical emergency response system.  Turn on lights to avoid dark areas. Replace light bulbs that burn out immediately. Get light  switches that glow.  Arrange furniture to create clear pathways. Keep furniture in the same place.  Firmly attach carpet with non-skid or double-sided tape.  Eliminate uneven floor surfaces.  Select a carpet pattern that does not visually hide the edge of steps.  Be aware of all pets. OTHER HOME SAFETY TIPS  Set the water temperature for 120 F (48.8 C).  Keep emergency numbers on or near the telephone.  Keep smoke detectors on every level of the home and near sleeping areas. Document Released: 12/14/2001 Document Revised: 06/25/2011 Document Reviewed: 03/15/2011 [facility] Patient Information 2015 [place], [place]. This information is not intended to replace advice given to you by your health care provider. Make sure you discuss any questions you have with your health care provider.

## 2013-06-23 NOTE — Progress Notes (Addendum)
Patient ID: Rita Payne, female   DOB: 23-Oct-1945, 68 y.o.   MRN: [id number]  [facility] Current Height: Height: 5\' 4"  (162.6 cm)      Max Lifetime Height:  5\' 6"  Current Weight: Weight: 198 lb (89.812 kg)       Ethnicity:Caucasian    HPI: Does pt already have a diagnosis of:  Osteopenia?  No Osteoporosis?  No  Back Pain?  Yes - lower back pain       Kyphosis?  No Prior fracture?  Left foot, right leg and ankles, ribs Med(s) for Osteoporosis/Osteopenia:  none Med(s) previously tried for Osteoporosis/Osteopenia:  none                                                             PMH: Age at menopause:  Surgical at 68yo Hysterectomy?  Yes Oophorectomy?  Removed 1 ovray and 1 ovary remaine HRT? Yes - Former.  Type/duration: estradiol - for about 15 years Steroid Use?  No Thyroid med?  No History of cancer?  No History of digestive disorders (ie Crohn's)?  No Current or previous eating disorders?  No Last Vitamin D Result:  46 (09/30/2011) Last GFR Result:  84 (04/08/2013)   FH/SH: Family history of osteoporosis?  No Parent with history of hip fracture?  Yes - mother Family history of breast cancer?  No Exercise?  Yes - walks on treadmill 3-4 days per week for 56minutes Smoking?  Yes - 10 cigarettes per day. Patient quit last year - use nicotine patches Alcohol?  No    Calcium Assessment Calcium Intake  # of servings/day  Calcium mg  Milk (8 oz) 1  x  300  = 300mg   Yogurt (4 oz) / cottage cheese 1 x  200 = 200mg   Cheese (1 oz) 2 x  200 = 400mg   Other Calcium sources   250mg   Ca supplement 0 = 0   Estimated calcium intake per day 1150mg     DEXA Results Date of Test T-Score for AP Spine L1-L4 T-Score for Total Left Hip T-Score for Total Right Hip  06/23/2013 2.5 0.3 -- (h/o hip replacmt)                  Assessment: Normal BMD Tobacco abuse / Smoking cessation  Recommendations: 1.  Discussed  fracture risk and BMD results  2.  recommend calcium 1200mg   daily through supplementation or diet.  3.  continue weight bearing exercise - 30 minutes at least 4 days per week.   4.  Counseled and educated about fall risk and prevention. 5.  Patient was counseled on smoking cessation and given tips as well as information about [place].  Rx sent in for Nicotine Patches 21mg  and 14mg  (start with 21mg  and apply once daily for 1 month then decrease to 14mg )  Recheck DEXA:  2 years  Time spent counseling patient:  30 minutes Cherre Robins, PharmD, CPP

## 2013-07-30 ENCOUNTER — Other Ambulatory Visit: Payer: Self-pay | Admitting: Nurse Practitioner

## 2013-08-03 ENCOUNTER — Ambulatory Visit (INDEPENDENT_AMBULATORY_CARE_PROVIDER_SITE_OTHER): Payer: Medicare Other | Admitting: Nurse Practitioner

## 2013-08-03 ENCOUNTER — Encounter: Payer: Self-pay | Admitting: Nurse Practitioner

## 2013-08-03 VITALS — BP 138/82 | HR 86 | Temp 97.0°F | Ht 64.0 in | Wt 209.4 lb

## 2013-08-03 DIAGNOSIS — Z6838 Body mass index (BMI) 38.0-38.9, adult: Secondary | ICD-10-CM

## 2013-08-03 DIAGNOSIS — R635 Abnormal weight gain: Secondary | ICD-10-CM

## 2013-08-03 DIAGNOSIS — M129 Arthropathy, unspecified: Secondary | ICD-10-CM

## 2013-08-03 DIAGNOSIS — J45909 Unspecified asthma, uncomplicated: Secondary | ICD-10-CM

## 2013-08-03 DIAGNOSIS — J4520 Mild intermittent asthma, uncomplicated: Secondary | ICD-10-CM

## 2013-08-03 DIAGNOSIS — E785 Hyperlipidemia, unspecified: Secondary | ICD-10-CM | POA: Diagnosis not present

## 2013-08-03 DIAGNOSIS — I10 Essential (primary) hypertension: Secondary | ICD-10-CM

## 2013-08-03 DIAGNOSIS — M1990 Unspecified osteoarthritis, unspecified site: Secondary | ICD-10-CM

## 2013-08-03 DIAGNOSIS — E119 Type 2 diabetes mellitus without complications: Secondary | ICD-10-CM | POA: Diagnosis not present

## 2013-08-03 DIAGNOSIS — Z713 Dietary counseling and surveillance: Secondary | ICD-10-CM

## 2013-08-03 DIAGNOSIS — E669 Obesity, unspecified: Secondary | ICD-10-CM

## 2013-08-03 LAB — POCT UA - MICROALBUMIN: MICROALBUMIN (UR) POC: 20 mg/L

## 2013-08-03 LAB — POCT GLYCOSYLATED HEMOGLOBIN (HGB A1C): HEMOGLOBIN A1C: 6.7

## 2013-08-03 MED ORDER — GLUCOSE BLOOD VI STRP: ORAL_STRIP | Status: DC

## 2013-08-03 MED ORDER — FREESTYLE FREEDOM LITE W/DEVICE KIT: 1.0000 | PACK | Freq: Every day | Status: DC

## 2013-08-03 MED ORDER — ROSUVASTATIN CALCIUM 40 MG PO TABS: ORAL_TABLET | ORAL | Status: DC

## 2013-08-03 MED ORDER — ALBUTEROL SULFATE HFA 108 (90 BASE) MCG/ACT IN AERS: INHALATION_SPRAY | RESPIRATORY_TRACT | Status: DC

## 2013-08-03 MED ORDER — FLUTICASONE-SALMETEROL 250-50 MCG/DOSE IN AEPB: 1.0000 | INHALATION_SPRAY | Freq: Two times a day (BID) | RESPIRATORY_TRACT | Status: DC

## 2013-08-03 MED ORDER — CELECOXIB 200 MG PO CAPS: ORAL_CAPSULE | ORAL | Status: DC

## 2013-08-03 NOTE — Progress Notes (Signed)
Subjective:    Patient ID: Rita Payne, female    DOB: 1945/07/07, 68 y.o.   MRN: [id number]  Patient here today for followup- no changes since last vist.    Diabetes She presents for her follow-up diabetic visit. She has type 2 diabetes mellitus. No MedicAlert identification noted. Her disease course has been stable. There are no hypoglycemic associated symptoms. Pertinent negatives for hypoglycemia include no headaches. Pertinent negatives for diabetes include no blurred vision, no chest pain, no polydipsia, no polyphagia, no polyuria, no visual change, no weakness and no weight loss. There are no hypoglycemic complications. Symptoms are stable. There are no diabetic complications. Risk factors for coronary artery disease include dyslipidemia, hypertension, obesity and post-menopausal. Current diabetic treatment includes diet. She is compliant with treatment all of the time. Her weight is stable. She is following a diabetic diet. When asked about meal planning, she reported none. She has not had a previous visit with a dietician. Exercise: Not able to exercise due to hip pain. Suppose to havehip replacement in JUne of this year. Her breakfast blood glucose is taken between 8-9 am. Her breakfast blood glucose range is generally 110-130 mg/dl. An ACE inhibitor/angiotensin II receptor blocker is contraindicated. She does not see a podiatrist.Eye exam is current (May 2013).  Hypertension This is a chronic problem. The current episode started more than 1 year ago. The problem is unchanged. The problem is controlled. Pertinent negatives include no blurred vision, chest pain, headaches, palpitations, peripheral edema or shortness of breath. There are no associated agents to hypertension. Risk factors for coronary artery disease include diabetes mellitus and dyslipidemia. Past treatments include angiotensin blockers. The current treatment provides significant improvement. There are no compliance problems.    Hyperlipidemia This is a chronic problem. The current episode started more than 1 year ago. The problem is controlled. Recent lipid tests were reviewed and are normal. Exacerbating diseases include diabetes and obesity. There are no known factors aggravating her hyperlipidemia. Pertinent negatives include no chest pain, focal sensory loss, leg pain, myalgias or shortness of breath. Current antihyperlipidemic treatment includes statins (stopped taking crestor 1 month ago because it was making legs hurt.). The current treatment provides significant improvement of lipids. There are no compliance problems.  Risk factors for coronary artery disease include diabetes mellitus and hypertension.  Asthma There is no chest tightness, cough or shortness of breath. This is a chronic problem. The current episode started more than 1 year ago. Pertinent negatives include no chest pain, headaches, myalgias, rhinorrhea or weight loss. Her symptoms are aggravated by nothing. Her symptoms are alleviated by steroid inhaler (Only uses albuterol 1-2 X per month). She reports complete improvement on treatment. Her past medical history is significant for asthma.  Hydradenitis This is a chronic problem. She keeps antibiotic on hand for flare ups. Is currently out. Last flare-up was 9 months ago.    Review of Systems  Constitutional: Negative for weight loss.  HENT: Negative for rhinorrhea.   Eyes: Negative for blurred vision.  Respiratory: Negative for cough and shortness of breath.   Cardiovascular: Negative for chest pain and palpitations.  Endocrine: Negative for polydipsia, polyphagia and polyuria.  Musculoskeletal: Negative for myalgias.  Neurological: Negative for weakness and headaches.  All other systems reviewed and are negative.      Objective:   Physical Exam  Constitutional: She is oriented to person, place, and time. She appears well-developed and well-nourished.  HENT:  Nose: Nose normal.   Mouth/Throat: Oropharynx is clear and moist.  Eyes: EOM are normal.  Neck: Trachea normal, normal range of motion and full passive range of motion without pain. Neck supple. No JVD present. Carotid bruit is not present. No thyromegaly present.  Cardiovascular: Normal rate, regular rhythm, normal heart sounds and intact distal pulses.  Exam reveals no gallop and no friction rub.   No murmur heard. Pulmonary/Chest: Effort normal and breath sounds normal.  Abdominal: Soft. Bowel sounds are normal. She exhibits no distension and no mass. There is no tenderness.  Musculoskeletal: Normal range of motion.  Lymphadenopathy:    She has no cervical adenopathy.  Neurological: She is alert and oriented to person, place, and time. She has normal reflexes.  (+) 4/4 monofilament bil feet  Skin: Skin is warm and dry.  No callus formation bil feet  Psychiatric: She has a normal mood and affect. Her behavior is normal. Judgment and thought content normal.   BP 138/82  Pulse 86  Temp(Src) 97 F (36.1 C) (Oral)  Ht 5\' 4"  (1.626 m)  Wt 209 lb 6.4 oz (94.983 kg)  BMI 35.93 kg/m2  LMP 04/14/1982 Results for orders placed in visit on 08/03/13  POCT GLYCOSYLATED HEMOGLOBIN (HGB A1C)      Result Value Ref Range   Hemoglobin A1C 6.7    POCT UA - MICROALBUMIN      Result Value Ref Range   Microalbumin Ur, POC 20          Assessment & Plan:   1. Hyperlipidemia with target LDL less than 100   2. Type 2 diabetes mellitus without complication   3. Essential hypertension   4. Obese   5. Asthma, chronic, mild intermittent, uncomplicated   6. BMI 38.0-38.9,adult   7. Weight loss counseling, encounter for   8. Arthritis    Orders Placed This Encounter  Procedures  . CMP14+EGFR  . NMR, lipoprofile  . Microalbumin, urine  . POCT glycosylated hemoglobin (Hb A1C)  . POCT UA - Microalbumin   Meds ordered this encounter  Medications  . celecoxib (CELEBREX) 200 MG capsule    Sig: TAKE 1 CAPSULE EVERY  12 HOURS    Dispense:  180 capsule    Refill:  1    Order Specific Question:  Supervising Provider    Answer:  01-20-1976 [[id number]]  . Fluticasone-Salmeterol (ADVAIR) 250-50 MCG/DOSE AEPB    Sig: Inhale 1 puff into the lungs every 12 (twelve) hours.    Dispense:  180 each    Refill:  1    Order Specific Question:  Supervising Provider    Answer:  Ernestina Penna [[id number]]  . rosuvastatin (CRESTOR) 40 MG tablet    Sig: TAKE 1 TABLET AT BEDTIME    Dispense:  90 tablet    Refill:  0    Order Specific Question:  Supervising Provider    Answer:  Ernestina Penna [[id number]]  . albuterol (PROAIR HFA) 108 (90 BASE) MCG/ACT inhaler    Sig: INHALE 2 PUFFS INTO THE LUNGS EVERY 6 HOURS AS NEEDED FOR WHEEZING    Dispense:  3 each    Refill:  5    Order Specific Question:  Supervising Provider    Answer:  Ernestina Penna [[id number]]  . glucose blood (FREESTYLE LITE) test strip    Sig: Use as instructed    Dispense:  100 each    Refill:  12    test 1x per day and prn  Dx 250.01    Order Specific Question:  Supervising Provider  Answer:  Chipper Herb [[id number]]  . Blood Glucose Monitoring Suppl (FREESTYLE FREEDOM LITE) W/DEVICE KIT    Sig: 1 each by Does not apply route daily.    Dispense:  1 each    Refill:  0    Order Specific Question:  Supervising Provider    Answer:  Joycelyn Man   Will try taking crestor QOD and see if has muscle aches Keep mammo appointment Dexa due in November this year Will check with [facility] for zostvax payment hemoccult cards given to patient with directions Labs pending Health maintenance reviewed Diet and exercise encouraged Continue all meds Follow up  In 3 months   [place], FNP

## 2013-08-03 NOTE — Patient Instructions (Signed)
Diabetes and Foot Care Diabetes may cause you to have problems because of poor blood supply (circulation) to your feet and legs. This may cause the skin on your feet to become thinner, break easier, and heal more slowly. Your skin may become dry, and the skin may peel and crack. You may also have nerve damage in your legs and feet causing decreased feeling in them. You may not notice minor injuries to your feet that could lead to infections or more serious problems. Taking care of your feet is one of the most important things you can do for yourself.  HOME CARE INSTRUCTIONS  Wear shoes at all times, even in the house. Do not go barefoot. Bare feet are easily injured.  Check your feet daily for blisters, cuts, and redness. If you cannot see the bottom of your feet, use a mirror or ask someone for help.  Wash your feet with warm water (do not use hot water) and mild soap. Then pat your feet and the areas between your toes until they are completely dry. Do not soak your feet as this can dry your skin.  Apply a moisturizing lotion or petroleum jelly (that does not contain alcohol and is unscented) to the skin on your feet and to dry, brittle toenails. Do not apply lotion between your toes.  Trim your toenails straight across. Do not dig under them or around the cuticle. File the edges of your nails with an emery board or nail file.  Do not cut corns or calluses or try to remove them with medicine.  Wear clean socks or stockings every day. Make sure they are not too tight. Do not wear knee-high stockings since they may decrease blood flow to your legs.  Wear shoes that fit properly and have enough cushioning. To break in new shoes, wear them for just a few hours a day. This prevents you from injuring your feet. Always look in your shoes before you put them on to be sure there are no objects inside.  Do not cross your legs. This may decrease the blood flow to your feet.  If you find a minor scrape,  cut, or break in the skin on your feet, keep it and the skin around it clean and dry. These areas may be cleansed with mild soap and water. Do not cleanse the area with peroxide, alcohol, or iodine.  When you remove an adhesive bandage, be sure not to damage the skin around it.  If you have a wound, look at it several times a day to make sure it is healing.  Do not use heating pads or hot water bottles. They may burn your skin. If you have lost feeling in your feet or legs, you may not know it is happening until it is too late.  Make sure your health care provider performs a complete foot exam at least annually or more often if you have foot problems. Report any cuts, sores, or bruises to your health care provider immediately. SEEK MEDICAL CARE IF:   You have an injury that is not healing.  You have cuts or breaks in the skin.  You have an ingrown nail.  You notice redness on your legs or feet.  You feel burning or tingling in your legs or feet.  You have pain or cramps in your legs and feet.  Your legs or feet are numb.  Your feet always feel cold. SEEK IMMEDIATE MEDICAL CARE IF:   There is increasing redness,   swelling, or pain in or around a wound.  There is a red line that goes up your leg.  Pus is coming from a wound.  You develop a fever or as directed by your health care provider.  You notice a bad smell coming from an ulcer or wound. Document Released: 12/22/1999 Document Revised: 08/26/2012 Document Reviewed: 06/02/2012 Exit[employer] Patient Information 2015 [employer]. This information is not intended to replace advice given to you by your health care provider. Make sure you discuss any questions you have with your health care provider.  

## 2013-08-04 LAB — CMP14+EGFR
ALT: 84 IU/L — ABNORMAL HIGH (ref 0–32)
AST: 57 IU/L — ABNORMAL HIGH (ref 0–40)
Albumin/Globulin Ratio: 2 (ref 1.1–2.5)
Albumin: 4.3 g/dL (ref 3.6–4.8)
Alkaline Phosphatase: 77 IU/L (ref 39–117)
BUN/Creatinine Ratio: 16 (ref 11–26)
BUN: 12 mg/dL (ref 8–27)
CO2: 23 mmol/L (ref 18–29)
Calcium: 9.2 mg/dL (ref 8.7–10.3)
Chloride: 100 mmol/L (ref 97–108)
Creatinine, Ser: 0.76 mg/dL (ref 0.57–1.00)
GFR calc Af Amer: 95 mL/min/{1.73_m2} (ref >59)
GFR calc non Af Amer: 82 mL/min/{1.73_m2} (ref >59)
Globulin, Total: 2.1 g/dL (ref 1.5–4.5)
Glucose: 125 mg/dL — ABNORMAL HIGH (ref 65–99)
Potassium: 4.5 mmol/L (ref 3.5–5.2)
Sodium: 139 mmol/L (ref 134–144)
Total Bilirubin: 0.4 mg/dL (ref 0.0–1.2)
Total Protein: 6.4 g/dL (ref 6.0–8.5)

## 2013-08-04 LAB — NMR, LIPOPROFILE
Cholesterol: 253 mg/dL — ABNORMAL HIGH (ref 100–199)
HDL CHOLESTEROL BY NMR: 33 mg/dL — AB (ref >39)
HDL Particle Number: 25.7 umol/L — ABNORMAL LOW (ref >=30.5)
LDL PARTICLE NUMBER: 2471 nmol/L — AB (ref <1000)
LDL Size: 19.8 nm (ref >20.5)
LDLC SERPL CALC-MCNC: 155 mg/dL — ABNORMAL HIGH (ref 0–99)
LP-IR Score: 80 — ABNORMAL HIGH (ref <=45)
Small LDL Particle Number: 1805 nmol/L — ABNORMAL HIGH (ref <=527)
Triglycerides by NMR: 323 mg/dL — ABNORMAL HIGH (ref 0–149)

## 2013-08-04 LAB — MICROALBUMIN, URINE: Microalbumin, Urine: 17.7 ug/mL — ABNORMAL HIGH (ref 0.0–17.0)

## 2013-09-15 ENCOUNTER — Telehealth: Payer: Self-pay | Admitting: Nurse Practitioner

## 2013-09-15 NOTE — Telephone Encounter (Signed)
appt given for Friday with Ronnald Collum

## 2013-09-17 ENCOUNTER — Encounter: Payer: Self-pay | Admitting: Nurse Practitioner

## 2013-09-17 ENCOUNTER — Ambulatory Visit (INDEPENDENT_AMBULATORY_CARE_PROVIDER_SITE_OTHER): Payer: Medicare Other | Admitting: Nurse Practitioner

## 2013-09-17 VITALS — BP 139/81 | HR 105 | Temp 98.7°F | Ht 64.0 in | Wt 209.8 lb

## 2013-09-17 DIAGNOSIS — K219 Gastro-esophageal reflux disease without esophagitis: Secondary | ICD-10-CM | POA: Diagnosis not present

## 2013-09-17 DIAGNOSIS — R079 Chest pain, unspecified: Secondary | ICD-10-CM

## 2013-09-17 MED ORDER — PANTOPRAZOLE SODIUM 40 MG PO TBEC: 40.0000 mg | DELAYED_RELEASE_TABLET | Freq: Every day | ORAL | Status: DC

## 2013-09-17 NOTE — Patient Instructions (Signed)
Gastroesophageal Reflux Disease, Adult Gastroesophageal reflux disease (GERD) happens when acid from your stomach flows up into the esophagus. When acid comes in contact with the esophagus, the acid causes soreness (inflammation) in the esophagus. Over time, GERD may create small holes (ulcers) in the lining of the esophagus. CAUSES   Increased body weight. This puts pressure on the stomach, making acid rise from the stomach into the esophagus.  Smoking. This increases acid production in the stomach.  Drinking alcohol. This causes decreased pressure in the lower esophageal sphincter (valve or ring of muscle between the esophagus and stomach), allowing acid from the stomach into the esophagus.  Late evening meals and a full stomach. This increases pressure and acid production in the stomach.  A malformed lower esophageal sphincter. Sometimes, no cause is found. SYMPTOMS   Burning pain in the lower part of the mid-chest behind the breastbone and in the mid-stomach area. This may occur twice a week or more often.  Trouble swallowing.  Sore throat.  Dry cough.  Asthma-like symptoms including chest tightness, shortness of breath, or wheezing. DIAGNOSIS  Your caregiver may be able to diagnose GERD based on your symptoms. In some cases, X-rays and other tests may be done to check for complications or to check the condition of your stomach and esophagus. TREATMENT  Your caregiver may recommend over-the-counter or prescription medicines to help decrease acid production. Ask your caregiver before starting or adding any new medicines.  HOME CARE INSTRUCTIONS   Change the factors that you can control. Ask your caregiver for guidance concerning weight loss, quitting smoking, and alcohol consumption.  Avoid foods and drinks that make your symptoms worse, such as:  Caffeine or alcoholic drinks.  Chocolate.  Peppermint or mint flavorings.  Garlic and onions.  Spicy foods.  Citrus fruits,  such as oranges, lemons, or limes.  Tomato-based foods such as sauce, chili, salsa, and pizza.  Fried and fatty foods.  Avoid lying down for the 3 hours prior to your bedtime or prior to taking a nap.  Eat small, frequent meals instead of large meals.  Wear loose-fitting clothing. Do not wear anything tight around your waist that causes pressure on your stomach.  Raise the head of your bed 6 to 8 inches with wood blocks to help you sleep. Extra pillows will not help.  Only take over-the-counter or prescription medicines for pain, discomfort, or fever as directed by your caregiver.  Do not take aspirin, ibuprofen, or other nonsteroidal anti-inflammatory drugs (NSAIDs). SEEK IMMEDIATE MEDICAL CARE IF:   You have pain in your arms, neck, jaw, teeth, or back.  Your pain increases or changes in intensity or duration.  You develop nausea, vomiting, or sweating (diaphoresis).  You develop shortness of breath, or you faint.  Your vomit is green, yellow, black, or looks like coffee grounds or blood.  Your stool is red, bloody, or black. These symptoms could be signs of other problems, such as heart disease, gastric bleeding, or esophageal bleeding. MAKE SURE YOU:   Understand these instructions.  Will watch your condition.  Will get help right away if you are not doing well or get worse. Document Released: 10/03/2004 Document Revised: 03/18/2011 Document Reviewed: 07/13/2010 Exit[employer] Patient Information 2015 [employer]. This information is not intended to replace advice given to you by your health care provider. Make sure you discuss any questions you have with your health care provider.  

## 2013-09-17 NOTE — Progress Notes (Signed)
   Subjective:    Patient ID: Rita Payne, female    DOB: 11/21/1945, 68 y.o.   MRN: [id number]  HPI Patient in c/o indigestion- started about 3 weeks ago- When it started it was so bad that [facility] actually thrown up 4-5 times about 4 hours after eating and all it is is gall. No food-She said it is worse at night when she lays down- Has tried zantac OTC and that has not helped.    Review of Systems  Constitutional: Negative.   HENT: Negative.   Respiratory: Negative.   Cardiovascular: Positive for chest pain (burning sensation).  Gastrointestinal: Positive for nausea and vomiting. Negative for abdominal pain and blood in stool.  Genitourinary: Negative.   Neurological: Negative.   Psychiatric/Behavioral: Negative.   All other systems reviewed and are negative.      Objective:   Physical Exam  Constitutional: She is oriented to person, place, and time. She appears well-developed and well-nourished.  Cardiovascular: Normal rate, regular rhythm and normal heart sounds.   Pulmonary/Chest: Effort normal and breath sounds normal.  Neurological: She is alert and oriented to person, place, and time.  Skin: Skin is warm and dry.  Psychiatric: She has a normal mood and affect. Her behavior is normal. Judgment and thought content normal.   BP 139/81  Pulse 105  Temp(Src) 98.7 F (37.1 C) (Oral)  Ht 5\' 4"  (1.626 m)  Wt 209 lb 12.8 oz (95.165 kg)  BMI 35.99 kg/m2  LMP 04/14/1982  ekg- 1st degree av block- Mary-Margaret Hassell Done, FNP         Assessment & Plan:   1. Chest pain, unspecified chest pain type   2. Gastroesophageal reflux disease without esophagitis    Meds ordered this encounter  Medications  . cetirizine (ZYRTEC) 10 MG tablet    Sig: Take 10 mg by mouth daily.  . ranitidine (ZANTAC) 150 MG tablet    Sig: Take 150 mg by mouth 2 (two) times daily.  . pantoprazole (PROTONIX) 40 MG tablet    Sig: Take 1 tablet (40 mg total) by  mouth daily.    Dispense:  30 tablet    Refill:  3    Order Specific Question:  Supervising Provider    Answer:  Joycelyn Man   Force fluids Avoid spicy foods Do  Not eat 2 hours prior to bedtime rto Prn  Mary-Margaret Hassell Done, FNP

## 2013-09-29 ENCOUNTER — Other Ambulatory Visit: Payer: Self-pay | Admitting: Nurse Practitioner

## 2013-10-04 ENCOUNTER — Telehealth: Payer: Self-pay | Admitting: Nurse Practitioner

## 2013-10-05 ENCOUNTER — Ambulatory Visit (INDEPENDENT_AMBULATORY_CARE_PROVIDER_SITE_OTHER): Payer: Medicare Other | Admitting: Family

## 2013-10-05 ENCOUNTER — Encounter: Payer: Self-pay | Admitting: Family

## 2013-10-05 VITALS — BP 120/75 | HR 104 | Temp 97.2°F | Ht 64.0 in | Wt 209.2 lb

## 2013-10-05 DIAGNOSIS — N390 Urinary tract infection, site not specified: Secondary | ICD-10-CM | POA: Diagnosis not present

## 2013-10-05 LAB — POCT UA - MICROSCOPIC ONLY
Bacteria, U Microscopic: NEGATIVE
CASTS, UR, LPF, POC: NEGATIVE
CRYSTALS, UR, HPF, POC: NEGATIVE
Mucus, UA: NEGATIVE
YEAST UA: NEGATIVE

## 2013-10-05 LAB — POCT URINALYSIS DIPSTICK

## 2013-10-05 MED ORDER — NITROFURANTOIN MONOHYD MACRO 100 MG PO CAPS: 100.0000 mg | ORAL_CAPSULE | Freq: Two times a day (BID) | ORAL | Status: DC

## 2013-10-05 NOTE — Patient Instructions (Signed)

## 2013-10-05 NOTE — Telephone Encounter (Signed)
appt given for today at 9:50

## 2013-10-05 NOTE — Progress Notes (Signed)
Subjective:    Patient ID: Rita Payne, female    DOB: March 16, 1945, 68 y.o.   MRN: [id number]  Urinary Tract Infection  This is a new problem. The current episode started yesterday. The problem occurs every urination. The problem has been gradually worsening. The quality of the pain is described as burning and shooting. The pain is at a severity of 10/10. The pain is moderate. There has been no fever. Associated symptoms include flank pain, frequency and urgency. Pertinent negatives include no chills, discharge, hematuria, nausea, possible pregnancy or vomiting. She has tried NSAIDs (AZO) for the symptoms. The treatment provided mild relief.      Review of Systems  Constitutional: Negative.  Negative for chills.  HENT: Negative.   Eyes: Negative.   Respiratory: Negative.  Negative for shortness of breath.   Cardiovascular: Negative.  Negative for palpitations.  Gastrointestinal: Negative.  Negative for nausea and vomiting.  Endocrine: Negative.   Genitourinary: Positive for urgency, frequency and flank pain. Negative for hematuria.  Musculoskeletal: Negative.   Neurological: Negative.  Negative for headaches.  Hematological: Negative.   Psychiatric/Behavioral: Negative.   All other systems reviewed and are negative.      Objective:   Physical Exam  Vitals reviewed. Constitutional: She is oriented to person, place, and time. She appears well-developed and well-nourished. No distress.  HENT:  Head: Normocephalic and atraumatic.  Right Ear: External ear normal.  Left Ear: External ear normal.  Mouth/Throat: Oropharynx is clear and moist.  Eyes: Pupils are equal, round, and reactive to light.  Neck: Normal range of motion. Neck supple. No thyromegaly present.  Cardiovascular: Normal rate, regular rhythm, normal heart sounds and intact distal pulses.   No murmur heard. Pulmonary/Chest: Effort normal and breath sounds normal. No respiratory distress. She has no wheezes.    Abdominal: Soft. Bowel sounds are normal. She exhibits no distension. There is no tenderness.  Musculoskeletal: Normal range of motion. She exhibits no edema and no tenderness.  Neurological: She is alert and oriented to person, place, and time. She has normal reflexes. No cranial nerve deficit.  Skin: Skin is warm and dry.  Psychiatric: She has a normal mood and affect. Her behavior is normal. Judgment and thought content normal.    Results for orders placed in visit on 10/05/13  POCT URINALYSIS DIPSTICK      Result Value Ref Range   Color, UA orange     Clarity, UA cloudy     Glucose, UA color interference     Bilirubin, UA color interference     Ketones, UA color interference     Spec Grav, UA       Blood, UA       pH, UA       Protein, UA color interference     Urobilinogen, UA       Nitrite, UA color interference     Leukocytes, UA      POCT UA - MICROSCOPIC ONLY      Result Value Ref Range   WBC, Ur, HPF, POC 15-20     RBC, urine, microscopic 1-5     Bacteria, U Microscopic neg     Mucus, UA neg     Epithelial cells, urine per micros occ     Crystals, Ur, HPF, POC neg     Casts, Ur, LPF, POC neg     Yeast, UA neg        BP 120/75  Pulse 104  Temp(Src) 97.2 F (36.2 C) (  Oral)  Ht 5\' 4"  (1.626 m)  Wt 209 lb 3.2 oz (94.892 kg)  BMI 35.89 kg/m2  LMP 04/14/1982     Assessment & Plan:  1. Urinary tract infection, site not specified -Force fluids AZO over the counter X2 days RTO prn - POCT urinalysis dipstick - POCT UA - Microscopic Only - nitrofurantoin, macrocrystal-monohydrate, (MACROBID) 100 MG capsule; Take 1 capsule (100 mg total) by mouth 2 (two) times daily.  Dispense: 10 capsule; Refill: 0  Evelina Dun, FNP

## 2013-10-13 ENCOUNTER — Telehealth: Payer: Self-pay | Admitting: Nurse Practitioner

## 2013-10-14 ENCOUNTER — Telehealth: Payer: Self-pay | Admitting: Nurse Practitioner

## 2013-10-14 DIAGNOSIS — Z96641 Presence of right artificial hip joint: Secondary | ICD-10-CM | POA: Diagnosis not present

## 2013-10-14 DIAGNOSIS — M545 Low back pain: Secondary | ICD-10-CM | POA: Diagnosis not present

## 2013-10-14 DIAGNOSIS — Z4789 Encounter for other orthopedic aftercare: Secondary | ICD-10-CM | POA: Diagnosis not present

## 2013-10-15 ENCOUNTER — Ambulatory Visit (INDEPENDENT_AMBULATORY_CARE_PROVIDER_SITE_OTHER): Payer: Medicare Other | Admitting: Family Medicine

## 2013-10-15 ENCOUNTER — Encounter: Payer: Self-pay | Admitting: Family Medicine

## 2013-10-15 ENCOUNTER — Telehealth: Payer: Self-pay | Admitting: Family Medicine

## 2013-10-15 VITALS — BP 137/82 | HR 83 | Temp 97.4°F | Ht 64.0 in | Wt 208.0 lb

## 2013-10-15 DIAGNOSIS — N390 Urinary tract infection, site not specified: Secondary | ICD-10-CM | POA: Diagnosis not present

## 2013-10-15 DIAGNOSIS — R300 Dysuria: Secondary | ICD-10-CM | POA: Diagnosis not present

## 2013-10-15 DIAGNOSIS — R3915 Urgency of urination: Secondary | ICD-10-CM | POA: Diagnosis not present

## 2013-10-15 LAB — POCT UA - MICROSCOPIC ONLY
Casts, Ur, LPF, POC: NEGATIVE
Crystals, Ur, HPF, POC: NEGATIVE
Mucus, UA: NEGATIVE
YEAST UA: NEGATIVE

## 2013-10-15 LAB — POCT URINALYSIS DIPSTICK
BILIRUBIN UA: NEGATIVE
GLUCOSE UA: NEGATIVE
KETONES UA: NEGATIVE
Nitrite, UA: POSITIVE
Protein, UA: NEGATIVE
Spec Grav, UA: 1.01
Urobilinogen, UA: NEGATIVE
pH, UA: 6

## 2013-10-15 MED ORDER — CIPROFLOXACIN HCL 500 MG PO TABS: 500.0000 mg | ORAL_TABLET | Freq: Two times a day (BID) | ORAL | Status: DC

## 2013-10-15 NOTE — Progress Notes (Signed)
Subjective:    Patient ID: Rita Payne, female    DOB: [birth year], 68 y.o.   MRN: [id number]  HPI Patient here today for on-going UTI. She was treated on 10/05/13 and completed that antibiotic. Now symptoms are back. The patient was previously treated with Macrodantin. She has had symptoms this week for 4 days. She has frequency, pressure, and urgency.        Patient Active Problem List   Diagnosis Date Noted  . Senile osteoporosis 06/23/2013  . Obese 08/26/2012  . S/P right THA, AA 08/25/2012  . Diabetes 03/12/2012  . Asthma, chronic 03/12/2012  . Hypertension 03/12/2012  . Hyperlipidemia with target LDL less than 100 03/12/2012  . Arthritis 03/12/2012   Outpatient Encounter Prescriptions as of 10/15/2013  Medication Sig  . albuterol (PROAIR HFA) 108 (90 BASE) MCG/ACT inhaler INHALE 2 PUFFS INTO THE LUNGS EVERY 6 HOURS AS NEEDED FOR WHEEZING  . aspirin 81 MG tablet Take 81 mg by mouth daily.  . Blood Glucose Monitoring Suppl (FREESTYLE FREEDOM LITE) W/DEVICE KIT 1 each by Does not apply route daily.  . celecoxib (CELEBREX) 200 MG capsule TAKE 1 CAPSULE EVERY 12 HOURS  . cetirizine (ZYRTEC) 10 MG tablet Take 10 mg by mouth daily.  . cholecalciferol (VITAMIN D) 1000 UNITS tablet Take 1,000 Units by mouth daily.  . Fluticasone-Salmeterol (ADVAIR) 250-50 MCG/DOSE AEPB Inhale 1 puff into the lungs every 12 (twelve) hours.  Marland Kitchen glucose blood (FREESTYLE LITE) test strip Use as instructed  . MICARDIS HCT 80-25 MG per tablet TAKE 1 TABLET EVERY MORNING  . nitrofurantoin, macrocrystal-monohydrate, (MACROBID) 100 MG capsule Take 1 capsule (100 mg total) by mouth 2 (two) times daily.  . pantoprazole (PROTONIX) 40 MG tablet Take 1 tablet (40 mg total) by mouth daily.  . ranitidine (ZANTAC) 150 MG tablet Take 150 mg by mouth 2 (two) times daily.  . rosuvastatin (CRESTOR) 40 MG tablet TAKE 1 TABLET AT BEDTIME  . [DISCONTINUED] loratadine (CLARITIN) 10 MG tablet Take 10 mg by mouth daily.     Review of Systems  Constitutional: Negative.   HENT: Negative.   Eyes: Negative.   Respiratory: Negative.   Cardiovascular: Negative.   Gastrointestinal: Negative.   Endocrine: Negative.   Genitourinary: Positive for dysuria, urgency and frequency.  Musculoskeletal: Negative.   Skin: Negative.   Allergic/Immunologic: Negative.   Neurological: Negative.   Hematological: Negative.   Psychiatric/Behavioral: Negative.        Objective:   Physical Exam  Nursing note and vitals reviewed. Constitutional: She is oriented to person, place, and time. She appears well-developed and well-nourished. No distress.  HENT:  Head: Normocephalic.  Eyes: Conjunctivae and EOM are normal. Pupils are equal, round, and reactive to light. Right eye exhibits no discharge. Left eye exhibits no discharge. No scleral icterus.  Neck: Normal range of motion.  Abdominal: Soft. Bowel sounds are normal. She exhibits no distension and no mass. There is tenderness. There is no rebound and no guarding.  The abdomen is obese. There is suprapubic tenderness. There is some low back tenderness but the patient attributes this to her ongoing back pain.  Musculoskeletal: Normal range of motion.  Neurological: She is alert and oriented to person, place, and time.  Skin: Skin is warm. No rash noted.  Psychiatric: She has a normal mood and affect. Her behavior is normal. Thought content normal.   BP 137/82  Pulse 83  Temp(Src) 97.4 F (36.3 C) (Oral)  Ht $R'5\' 4"'xE$  (1.626 m)  Wt 208  lb (94.348 kg)  BMI 35.69 kg/m2  LMP 04/14/1982  Results for orders placed in visit on 10/15/13  POCT UA - MICROSCOPIC ONLY      Result Value Ref Range   WBC, Ur, HPF, POC tntc     RBC, urine, microscopic 3-5     Bacteria, U Microscopic few     Mucus, UA negative     Epithelial cells, urine per micros few     Crystals, Ur, HPF, POC negative     Casts, Ur, LPF, POC negative     Yeast, UA negative    POCT URINALYSIS DIPSTICK       Result Value Ref Range   Color, UA gold     Clarity, UA sl cloudy     Glucose, UA negative     Bilirubin, UA negative     Ketones, UA negative     Spec Grav, UA 1.010     Blood, UA large     pH, UA 6.0     Protein, UA negative     Urobilinogen, UA negative     Nitrite, UA positive     Leukocytes, UA large (3+)           Assessment & Plan:  1. Dysuria - POCT UA - Microscopic Only - POCT urinalysis dipstick - Urine culture - ciprofloxacin (CIPRO) 500 MG tablet; Take 1 tablet (500 mg total) by mouth 2 (two) times daily.  Dispense: 20 tablet; Refill: 0  2. Urgency of urination - ciprofloxacin (CIPRO) 500 MG tablet; Take 1 tablet (500 mg total) by mouth 2 (two) times daily.  Dispense: 20 tablet; Refill: 0  3. UTI (lower urinary tract infection) - ciprofloxacin (CIPRO) 500 MG tablet; Take 1 tablet (500 mg total) by mouth 2 (two) times daily.  Dispense: 20 tablet; Refill: 0  Patient Instructions  Continue to drink plenty of fluid Take antibiotic as directed We will call you with the results of the culture and sensitivity, and if the antibiotic needs to be changed we will let you know at that time If you continue to have problems despite antibiotic treatment, he may need to be referred to a urologist.   Arrie Senate MD

## 2013-10-15 NOTE — Telephone Encounter (Signed)
Pt is now here for appt for follow up - was no better

## 2013-10-15 NOTE — Telephone Encounter (Signed)
Appt given for today per patients request 

## 2013-10-15 NOTE — Telephone Encounter (Signed)
Pt is now here for followup appt

## 2013-10-15 NOTE — Patient Instructions (Signed)
Continue to drink plenty of fluid Take antibiotic as directed We will call you with the results of the culture and sensitivity, and if the antibiotic needs to be changed we will let you know at that time If you continue to have problems despite antibiotic treatment, he may need to be referred to a urologist.

## 2013-10-17 LAB — URINE CULTURE

## 2013-10-18 ENCOUNTER — Other Ambulatory Visit (HOSPITAL_COMMUNITY): Payer: Self-pay | Admitting: Orthopedic Surgery

## 2013-10-18 DIAGNOSIS — M549 Dorsalgia, unspecified: Secondary | ICD-10-CM

## 2013-10-22 ENCOUNTER — Encounter (HOSPITAL_COMMUNITY): Payer: Self-pay

## 2013-10-22 ENCOUNTER — Ambulatory Visit (HOSPITAL_COMMUNITY)
Admission: RE | Admit: 2013-10-22 | Discharge: 2013-10-22 | Disposition: A | Discharge status: Home / Self Care | Payer: Medicare Other | Source: Ambulatory Visit | Attending: Orthopedic Surgery | Admitting: Orthopedic Surgery

## 2013-10-22 DIAGNOSIS — M4806 Spinal stenosis, lumbar region: Secondary | ICD-10-CM | POA: Diagnosis not present

## 2013-10-22 DIAGNOSIS — M549 Dorsalgia, unspecified: Secondary | ICD-10-CM

## 2013-10-22 DIAGNOSIS — M545 Low back pain: Secondary | ICD-10-CM | POA: Diagnosis present

## 2013-11-05 DIAGNOSIS — M545 Low back pain: Secondary | ICD-10-CM | POA: Diagnosis not present

## 2013-11-11 ENCOUNTER — Ambulatory Visit (INDEPENDENT_AMBULATORY_CARE_PROVIDER_SITE_OTHER): Payer: Medicare Other | Admitting: Nurse Practitioner

## 2013-11-11 ENCOUNTER — Ambulatory Visit (INDEPENDENT_AMBULATORY_CARE_PROVIDER_SITE_OTHER): Payer: Medicare Other | Admitting: *Deleted

## 2013-11-11 ENCOUNTER — Encounter: Payer: Self-pay | Admitting: Nurse Practitioner

## 2013-11-11 VITALS — BP 141/82 | HR 100 | Temp 98.1°F | Ht 64.0 in | Wt 208.4 lb

## 2013-11-11 DIAGNOSIS — Z23 Encounter for immunization: Secondary | ICD-10-CM

## 2013-11-11 DIAGNOSIS — E119 Type 2 diabetes mellitus without complications: Secondary | ICD-10-CM

## 2013-11-11 DIAGNOSIS — E669 Obesity, unspecified: Secondary | ICD-10-CM

## 2013-11-11 DIAGNOSIS — J4520 Mild intermittent asthma, uncomplicated: Secondary | ICD-10-CM | POA: Diagnosis not present

## 2013-11-11 DIAGNOSIS — E785 Hyperlipidemia, unspecified: Secondary | ICD-10-CM

## 2013-11-11 DIAGNOSIS — J302 Other seasonal allergic rhinitis: Secondary | ICD-10-CM | POA: Diagnosis not present

## 2013-11-11 DIAGNOSIS — I10 Essential (primary) hypertension: Secondary | ICD-10-CM

## 2013-11-11 DIAGNOSIS — K219 Gastro-esophageal reflux disease without esophagitis: Secondary | ICD-10-CM | POA: Diagnosis not present

## 2013-11-11 LAB — POCT GLYCOSYLATED HEMOGLOBIN (HGB A1C)

## 2013-11-11 MED ORDER — TELMISARTAN-HCTZ 80-25 MG PO TABS: ORAL_TABLET | ORAL | Status: DC

## 2013-11-11 MED ORDER — FLUTICASONE-SALMETEROL 250-50 MCG/DOSE IN AEPB: 1.0000 | INHALATION_SPRAY | Freq: Two times a day (BID) | RESPIRATORY_TRACT | Status: DC

## 2013-11-11 MED ORDER — ALBUTEROL SULFATE HFA 108 (90 BASE) MCG/ACT IN AERS: INHALATION_SPRAY | RESPIRATORY_TRACT | Status: DC

## 2013-11-11 MED ORDER — PANTOPRAZOLE SODIUM 40 MG PO TBEC: 40.0000 mg | DELAYED_RELEASE_TABLET | Freq: Every day | ORAL | Status: DC

## 2013-11-11 MED ORDER — CETIRIZINE HCL 10 MG PO TABS: 10.0000 mg | ORAL_TABLET | Freq: Every day | ORAL | Status: DC

## 2013-11-11 MED ORDER — ROSUVASTATIN CALCIUM 40 MG PO TABS
ORAL_TABLET | ORAL | Status: DC
Start: 2013-11-11 — End: 2014-06-01

## 2013-11-11 NOTE — Progress Notes (Signed)
Subjective:    Patient ID: Rita Payne, female    DOB: May 22, 1945, 68 y.o.   MRN: [id number]  HPI Patient in today for follow up of chronic medical problems: Patient getting ready to have back surgery- and needs clearance- SHe says that Elv[facility] Sidle is going to be doing EKG and chest x ray. Hypertension Currently on micardis/hctz - working well no c/o side effects- not watching diet or exercising Hyperlipidemia crestor QOD- no muscle aches- not watching diet GERD Protonix working better than zantac was- symptoms under good control Diabetes Patient has been under a lot of stress and says that blood sugar is running a little igh when she checks it. Not watching diet Asthma Uses advair daily- only uses albuterol maybe 2x a month. Patient does smoke about 6 cigarettes a day.     Review of Systems  Constitutional: Negative.   HENT: Negative.   Respiratory: Negative.   Cardiovascular: Negative.   Genitourinary: Negative.   Neurological: Negative.   Psychiatric/Behavioral: Negative.   All other systems reviewed and are negative.      Objective:   Physical Exam  Constitutional: She is oriented to person, place, and time. She appears well-developed and well-nourished.  HENT:  Nose: Nose normal.  Mouth/Throat: Oropharynx is clear and moist.  Eyes: EOM are normal.  Neck: Trachea normal, normal range of motion and full passive range of motion without pain. Neck supple. No JVD present. Carotid bruit is not present. No thyromegaly present.  Cardiovascular: Normal rate, regular rhythm, normal heart sounds and intact distal pulses.  Exam reveals no gallop and no friction rub.   No murmur heard. Pulmonary/Chest: Effort normal. She has wheezes (faint exp wheezes in bil bases).  Abdominal: Soft. Bowel sounds are normal. She exhibits no distension and no mass. There is no tenderness.  Musculoskeletal: Normal range of motion.  Lymphadenopathy:    She has no cervical adenopathy.    Neurological: She is alert and oriented to person, place, and time. She has normal reflexes.  Skin: Skin is warm and dry.  Psychiatric: She has a normal mood and affect. Her behavior is normal. Judgment and thought content normal.   Results for orders placed or performed in visit on 11/11/13  POCT glycosylated hemoglobin (Hb A1C)  Result Value Ref Range   Hemoglobin A1C 7.9%    BP 141/82 mmHg  Pulse 100  Temp(Src) 98.1 F (36.7 C) (Oral)  Ht $R'5\' 4"'Ud$  (1.626 m)  Wt 208 lb 6.4 oz (94.53 kg)  BMI 35.75 kg/m2  LMP 04/14/1982        Assessment & Plan:  1. Type 2 diabetes mellitus without complication [id number] elevated- patient does not want to make changes to meds at this time wants to try diet to get back down Keep diary of fasting blood sugars - POCT glycosylated hemoglobin (Hb A1C)  2. Hyperlipidemia with target LDL less than 100 Low fat diet - CMP14+EGFR - rosuvastatin (CRESTOR) 40 MG tablet; TAKE 1 TABLET AT BEDTIME  Dispense: 90 tablet; Refill: 1  3. Essential hypertension Low NA+ diet - NMR, lipoprofile - telmisartan-hydrochlorothiazide (MICARDIS HCT) 80-25 MG per tablet; TAKE 1 TABLET EVERY MORNING  Dispense: 90 tablet; Refill: 1  4. Asthma, chronic, mild intermittent, uncomplicated Avoid allergens - Fluticasone-Salmeterol (ADVAIR) 250-50 MCG/DOSE AEPB; Inhale 1 puff into the lungs every 12 (twelve) hours.  Dispense: 180 each; Refill: 1 - albuterol (PROAIR HFA) 108 (90 BASE) MCG/ACT inhaler; INHALE 2 PUFFS INTO THE LUNGS EVERY 6 HOURS AS NEEDED FOR WHEEZING  Dispense: 3 each; Refill: 1  5. Obese Discussed diet and exercise for person with BMI >25 Will recheck weight in 3-6 months   6. Gastroesophageal reflux disease without esophagitis Avoid spicy and fatty foods - pantoprazole (PROTONIX) 40 MG tablet; Take 1 tablet (40 mg total) by mouth daily.  Dispense: 90 tablet; Refill: 1  7. Other seasonal allergic rhinitis - cetirizine (ZYRTEC) 10 MG tablet; Take 1 tablet  (10 mg total) by mouth daily.  Dispense: 90 tablet; Refill: 1  Cleared for surgery encouraged to do hemocult cards encourgaed to schedule mammogram Labs pending Health maintenance reviewed Diet and exercise encouraged Continue all meds Follow up  In 3 months   [place], FNP

## 2013-11-11 NOTE — Patient Instructions (Signed)

## 2013-11-12 LAB — CMP14+EGFR
ALT: 76 IU/L — AB (ref 0–32)
AST: 85 IU/L — ABNORMAL HIGH (ref 0–40)
Albumin/Globulin Ratio: 1.8 (ref 1.1–2.5)
Albumin: 4.2 g/dL (ref 3.6–4.8)
Alkaline Phosphatase: 87 IU/L (ref 39–117)
BUN/Creatinine Ratio: 18 (ref 11–26)
BUN: 13 mg/dL (ref 8–27)
CALCIUM: 10 mg/dL (ref 8.7–10.3)
CO2: 26 mmol/L (ref 18–29)
CREATININE: 0.71 mg/dL (ref 0.57–1.00)
Chloride: 95 mmol/L — ABNORMAL LOW (ref 97–108)
GFR calc Af Amer: 103 mL/min/{1.73_m2} (ref >59)
GFR calc non Af Amer: 89 mL/min/{1.73_m2} (ref >59)
GLUCOSE: 153 mg/dL — AB (ref 65–99)
Globulin, Total: 2.4 g/dL (ref 1.5–4.5)
POTASSIUM: 4.6 mmol/L (ref 3.5–5.2)
Sodium: 138 mmol/L (ref 134–144)
TOTAL PROTEIN: 6.6 g/dL (ref 6.0–8.5)
Total Bilirubin: 0.6 mg/dL (ref 0.0–1.2)

## 2013-11-12 LAB — NMR, LIPOPROFILE
Cholesterol: 205 mg/dL — ABNORMAL HIGH (ref 100–199)
HDL CHOLESTEROL BY NMR: 38 mg/dL — AB (ref >39)
HDL Particle Number: 29.2 umol/L — ABNORMAL LOW (ref >=30.5)
LDL Particle Number: 2030 nmol/L — ABNORMAL HIGH (ref <1000)
LDL Size: 20.1 nm (ref >20.5)
LDL-C: 106 mg/dL — AB (ref 0–99)
LP-IR Score: 71 — ABNORMAL HIGH (ref <=45)
SMALL LDL PARTICLE NUMBER: 1175 nmol/L — AB (ref <=527)
Triglycerides by NMR: 307 mg/dL — ABNORMAL HIGH (ref 0–149)

## 2013-11-16 ENCOUNTER — Ambulatory Visit: Payer: Self-pay | Admitting: Orthopedic Surgery

## 2013-11-30 ENCOUNTER — Encounter (HOSPITAL_COMMUNITY): Payer: Self-pay

## 2013-11-30 ENCOUNTER — Encounter (HOSPITAL_COMMUNITY)
Admission: RE | Admit: 2013-11-30 | Discharge: 2013-11-30 | Disposition: A | Discharge status: Home / Self Care | Payer: Medicare Other | Source: Ambulatory Visit | Attending: Specialist | Admitting: Specialist

## 2013-11-30 ENCOUNTER — Ambulatory Visit (HOSPITAL_COMMUNITY)
Admission: RE | Admit: 2013-11-30 | Discharge: 2013-11-30 | Disposition: A | Discharge status: Home / Self Care | Payer: Medicare Other | Source: Ambulatory Visit | Attending: Orthopedic Surgery | Admitting: Orthopedic Surgery

## 2013-11-30 ENCOUNTER — Ambulatory Visit (HOSPITAL_COMMUNITY)
Admission: RE | Admit: 2013-11-30 | Discharge: 2013-11-30 | Disposition: A | Discharge status: Home / Self Care | Payer: Medicare Other | Source: Ambulatory Visit | Attending: Anesthesiology | Admitting: Anesthesiology

## 2013-11-30 DIAGNOSIS — M47817 Spondylosis without myelopathy or radiculopathy, lumbosacral region: Secondary | ICD-10-CM | POA: Diagnosis not present

## 2013-11-30 DIAGNOSIS — I10 Essential (primary) hypertension: Secondary | ICD-10-CM | POA: Diagnosis not present

## 2013-11-30 DIAGNOSIS — M5137 Other intervertebral disc degeneration, lumbosacral region: Secondary | ICD-10-CM | POA: Diagnosis not present

## 2013-11-30 DIAGNOSIS — Z01818 Encounter for other preprocedural examination: Secondary | ICD-10-CM | POA: Diagnosis not present

## 2013-11-30 DIAGNOSIS — M48061 Spinal stenosis, lumbar region without neurogenic claudication: Secondary | ICD-10-CM

## 2013-11-30 LAB — SURGICAL PCR SCREEN
MRSA, PCR: NEGATIVE
Staphylococcus aureus: NEGATIVE

## 2013-11-30 LAB — CBC
HEMATOCRIT: 40.8 % (ref 36.0–46.0)
Hemoglobin: 13.8 g/dL (ref 12.0–15.0)
MCH: 31 pg (ref 26.0–34.0)
MCHC: 33.8 g/dL (ref 30.0–36.0)
MCV: 91.7 fL (ref 78.0–100.0)
Platelets: 196 10*3/uL (ref 150–400)
RBC: 4.45 MIL/uL (ref 3.87–5.11)
RDW: 14.5 % (ref 11.5–15.5)
WBC: 7.7 10*3/uL (ref 4.0–10.5)

## 2013-11-30 LAB — BASIC METABOLIC PANEL
Anion gap: 13 (ref 5–15)
BUN: 15 mg/dL (ref 6–23)
CALCIUM: 9.8 mg/dL (ref 8.4–10.5)
CO2: 26 mEq/L (ref 19–32)
Chloride: 97 mEq/L (ref 96–112)
Creatinine, Ser: 0.76 mg/dL (ref 0.50–1.10)
GFR calc non Af Amer: 86 mL/min — ABNORMAL LOW (ref >90)
Glucose, Bld: 159 mg/dL — ABNORMAL HIGH (ref 70–99)
Potassium: 4.2 mEq/L (ref 3.7–5.3)
Sodium: 136 mEq/L — ABNORMAL LOW (ref 137–147)

## 2013-11-30 NOTE — Pre-Procedure Instructions (Addendum)
11-30-13 EKG 09-17-13 Epic. CXR done today. Back xray done per order.

## 2013-11-30 NOTE — Patient Instructions (Addendum)
[place]  11/30/2013   Your procedure is scheduled on:   12-08-2013 Wednesday  Enter through [facility] Entrance and follow signs to [facility]. Arrive at      0900  AM.  Call this number if you have problems the morning of surgery: [phone]  Or Presurgical Testing [phone].   For Living Will and/or Health Care Power Attorney Forms: please provide copy for your medical record,may bring AM of surgery(Forms should be already notarized -we do not provide this service).(11-30-13 Yes, will provide AM of 12-08-13).     Do not eat food/ or drink: After Midnight.     Take these medicines the morning of surgery with A SIP OF WATER: Pantoprazole. Zyrtec. Use Inhalers/bring.   Do not wear jewelry, make-up or nail polish.  Do not wear deodorant, lotions, powders, or perfumes.   Do not shave legs and under arms- 48 hours(2 days) prior to first CHG shower.(Shaving face and neck okay.)  Do not bring valuables to the hospital.(Hospital is not responsible for lost valuables).  Contacts, dentures or removable bridgework, body piercing, hair pins may not be worn into surgery.  Leave suitcase in the car. After surgery it may be brought to your room.  For patients admitted to the hospital, checkout time is 11:00 AM the day of discharge.(Restricted visitors-Any Persons displaying flu-like symptoms or illness).    Patients discharged the day of surgery will not be allowed to drive home. Must have responsible person with you x 24 hours once discharged.  Name and phone number of your driver: Deirdre Pippins- [phone] home     Please read over the following fact sheets that you were given:  CHG(Chlorhexidine Gluconate 4% Surgical Soap) use, MRSA Information, Blood Transfusion fact sheet, Incentive Spirometry Instruction.  Remember : Type/Screen "Blue armbands" - may not be removed once applied(would result in being retested AM of surgery, if removed).         [place]  - Preparing for Surgery Before surgery, you can play an important role.  Because skin is not sterile, your skin needs to be as free of germs as possible.  You can reduce the number of germs on your skin by washing with CHG (chlorahexidine gluconate) soap before surgery.  CHG is an antiseptic cleaner which kills germs and bonds with the skin to continue killing germs even after washing. Please DO NOT use if you have an allergy to CHG or antibacterial soaps.  If your skin becomes reddened/irritated stop using the CHG and inform your nurse when you arrive at Short Stay. Do not shave (including legs and underarms) for at least 48 hours prior to the first CHG shower.  You may shave your face/neck. Please follow these instructions carefully:  1.  Shower with CHG Soap the night before surgery and the  morning of Surgery.  2.  If you choose to wash your hair, wash your hair first as usual with your  normal  shampoo.  3.  After you shampoo, rinse your hair and body thoroughly to remove the  shampoo.                           4.  Use CHG as you would any other liquid soap.  You can apply chg directly  to the skin and wash  Gently with a scrungie or clean washcloth.  5.  Apply the CHG Soap to your body ONLY FROM THE NECK DOWN.   Do not use on face/ open                           Wound or open sores. Avoid contact with eyes, ears mouth and genitals (private parts).                       Wash face,  Genitals (private parts) with your normal soap.             6.  Wash thoroughly, paying special attention to the area where your surgery  will be performed.  7.  Thoroughly rinse your body with warm water from the neck down.  8.  DO NOT shower/wash with your normal soap after using and rinsing off  the CHG Soap.                9.  Pat yourself dry with a clean towel.            10.  Wear clean pajamas.            11.  Place clean sheets on your bed the night of your first shower and do not  sleep  with pets. Day of Surgery : Do not apply any lotions/deodorants the morning of surgery.  Please wear clean clothes to the hospital/surgery center.  FAILURE TO FOLLOW THESE INSTRUCTIONS MAY RESULT IN THE CANCELLATION OF YOUR SURGERY PATIENT SIGNATURE_________________________________  NURSE SIGNATURE__________________________________  ________________________________________________________________________   Adam Phenix  An incentive spirometer is a tool that can help keep your lungs clear and active. This tool measures how well you are filling your lungs with each breath. Taking long deep breaths may help reverse or decrease the chance of developing breathing (pulmonary) problems (especially infection) following:  A long period of time when you are unable to move or be active. BEFORE THE PROCEDURE   If the spirometer includes an indicator to show your best effort, your nurse or respiratory therapist will set it to a desired goal.  If possible, sit up straight or lean slightly forward. Try not to slouch.  Hold the incentive spirometer in an upright position. INSTRUCTIONS FOR USE  1. Sit on the edge of your bed if possible, or sit up as far as you can in bed or on a chair. 2. Hold the incentive spirometer in an upright position. 3. Breathe out normally. 4. Place the mouthpiece in your mouth and seal your lips tightly around it. 5. Breathe in slowly and as deeply as possible, raising the piston or the ball toward the top of the column. 6. Hold your breath for 3-5 seconds or for as long as possible. Allow the piston or ball to fall to the bottom of the column. 7. Remove the mouthpiece from your mouth and breathe out normally. 8. Rest for a few seconds and repeat Steps 1 through 7 at least 10 times every 1-2 hours when you are awake. Take your time and take a few normal breaths between deep breaths. 9. The spirometer may include an indicator to show your best effort. Use the  indicator as a goal to work toward during each repetition. 10. After each set of 10 deep breaths, practice coughing to be sure your lungs are clear. If you have an incision (the cut made at the time of  surgery), support your incision when coughing by placing a pillow or rolled up towels firmly against it. Once you are able to get out of bed, walk around indoors and cough well. You may stop using the incentive spirometer when instructed by your caregiver.  RISKS AND COMPLICATIONS  Take your time so you do not get dizzy or light-headed.  If you are in pain, you may need to take or ask for pain medication before doing incentive spirometry. It is harder to take a deep breath if you are having pain. AFTER USE  Rest and breathe slowly and easily.  It can be helpful to keep track of a log of your progress. Your caregiver can provide you with a simple table to help with this. If you are using the spirometer at home, follow these instructions: [place] IF:   You are having difficultly using the spirometer.  You have trouble using the spirometer as often as instructed.  Your pain medication is not giving enough relief while using the spirometer.  You develop fever of 100.5 F (38.1 C) or higher. SEEK IMMEDIATE MEDICAL CARE IF:   You cough up bloody sputum that had not been present before.  You develop fever of 102 F (38.9 C) or greater.  You develop worsening pain at or near the incision site. MAKE SURE YOU:   Understand these instructions.  Will watch your condition.  Will get help right away if you are not doing well or get worse. Document Released: 05/06/2006 Document Revised: 03/18/2011 Document Reviewed: 07/07/2006 [facility] Patient Information 2014 [place], [place].   ________________________________________________________________________

## 2013-12-06 ENCOUNTER — Ambulatory Visit: Payer: Self-pay | Admitting: Orthopedic Surgery

## 2013-12-06 NOTE — H&P (Signed)
Rita Payne is an 68 y.o. female.   Chief Complaint: back and leg pain HPI: The patient is a 68 year old female who presents with back pain. The patient is here today in referral from [name], PA-C for Dr. Alvan Dame. The patient reports low back symptoms without any known injury. Symptoms are reported to be located in the low back and Symptoms include pain. The pain radiates to the left buttock and right buttock. Symptoms are exacerbated by standing (and walking), lifting and bending. Current treatment includes non-opioid analgesics and activity modification. Prior to being seen today the patient was previously evaluated [name], PA-C. Past evaluation has included x-ray of the lumbar spine and MRI of the lumbar spine. Past treatment has included non-opioid analgesics.  Rita Payne reports she has had a very significantly difficulty with walking. This has been progressive over the past two years. She lives alone. She is unable to get out to the mailbox without having to sit. It radiates down both legs, left and right. She is having hip pain as well. Some back pain but more radiation into the buttocks and hip. She was seen by [name] and had a MRI of her spine which indicated severe stenosis at 4-5, multifactorial. There is severe disc degeneration at 3-4 and at 5-1 and facet arthrosis mild to moderate right foraminal stenosis. Disc degeneration at 2-3 as well. She has had a hip operation. She has had physical therapy, activity modification, strategies to avoid reinjury. She has been having persistent symptoms. She is unable to walk. She has done well from her hip replacement. This is limiting her significantly.  Past Medical History  Diagnosis Date  . Asthma   . Hypertension   . Hyperlipidemia   . Arthritis   . Pneumonia     hx of   . Diabetes mellitus without complication     type II not on meds 5.9 AIC on 07/14/12   . Sleep apnea     mild sleep apnea 10 years ago not on CPAP   .  Kidney stones     hx of   . UTI (urinary tract infection)     hx of   . Teeth problem     molar tooth recent "broke off " tx. with antibiotic.    Past Surgical History  Procedure Laterality Date  . Tonsillectomy    . Abdominal hysterectomy    . Cholecystectomy    . Knee arthroscopy      bilateral knees  . Hernia repair    . Kidney tumor removal       1986  . Belpharoptosis repair      bilateral - 2009   . Total hip arthroplasty Right 08/25/2012    Procedure: RIGHT TOTAL HIP ARTHROPLASTY ANTERIOR APPROACH;  Surgeon: [name], MD;  Location: [facility];  Service: Orthopedics;  Laterality: Right;  . Joint replacement      Family History  Problem Relation Age of Onset  . Diabetes Mother   . Heart disease Mother   . Stroke Mother   . Heart disease Father   . Stroke Father   . Vision loss Brother   . Diabetes Sister   . Diabetes Sister   . Stroke Sister   . Heart disease Brother    Social History:  reports that she has been smoking Cigarettes.  She has a 1.5 pack-year smoking history. She has never used smokeless tobacco. She reports that she does not drink alcohol or use illicit  drugs.  Allergies:  Allergies  Allergen Reactions  . Adhesive [Tape]     blisters  . Iodine Other (See Comments)    Iodine that is applied to skin causes blisters   . Ivp Dye [Iodinated Diagnostic Agents]     unknown  . Pyridium [Phenazopyridine Hcl]     unknown  . Shellfish Allergy     unknown  . Vasotec [Enalapril]     unknown     (Not in a hospital admission)  No results found for this or any previous visit (from the past 48 hour(s)). No results found.  Review of Systems  Constitutional: Negative.   HENT: Negative.   Eyes: Negative.   Respiratory: Negative.   Cardiovascular: Negative.   Gastrointestinal: Negative.   Genitourinary: Negative.   Musculoskeletal: Positive for back pain.  Skin: Negative.   Neurological: Positive for focal weakness.  Psychiatric/Behavioral:  Negative.     Last menstrual period 04/14/1982. Physical Exam  Constitutional: She is oriented to person, place, and time. She appears well-developed and well-nourished.  HENT:  Head: Normocephalic and atraumatic.  Eyes: Conjunctivae and EOM are normal. Pupils are equal, round, and reactive to light.  Neck: Normal range of motion. Neck supple.  Cardiovascular: Normal rate and regular rhythm.   Respiratory: Effort normal and breath sounds normal.  GI: Soft. Bowel sounds are normal.  Musculoskeletal:  She walks with a forward flexed antalgic gait. She is unable to fully extend with extension and develops pain in the buttock and posterior thigh. Straight leg raise produces buttock pain bilaterally. She has quad weakness bilaterally at 5-/5 as well as dorsiflexion, weakness, and EHL. Limited flexion and extension. Lumbar spine exam reveals no evidence of soft tissue swelling, ecchymosis or deformity. On palpation there is no flank pain with percussion. Nontender over the trochanters.  Neurological: She is alert and oriented to person, place, and time. She has normal reflexes.  Skin: Skin is warm and dry.  Psychiatric: She has a normal mood and affect.    Three view radiographs lumbar spine demonstrates multilevel disc degeneration at 3-4, 4-5, and 5-1. No instability with flexion and extension. Prosthesis is well placed.  MRI demonstrates severe multifactorial stenosis at 4-5, moderate at 3-4. On AP radiograph there is mild scoliosis noted  Assessment/Plan Spinal stenosis 1. Neurogenic claudication secondary to severe spinal stenosis at 4-5 into 3-4, possibly at 5-1 on the right. 2. Secondary back pain.  I had extensive discussion with [name] concerning current pathology, relevant anatomy, and treatment options. She feels her symptoms are consistent with neurogenic claudication secondary to severe spinal stenosis at 4-5 limiting her ambulatory capacity. She does have back pain  secondary to multilevel disc degeneration and facet arthropathy but her ambulatory function and claudication symptoms which she is unable to tolerate. She can tolerate the back pain. We discussed options of epidural steroid injection. Living with her symptoms, cane, forward flexion, sitting, stationary bike, and so forth she indicates she is very active and wants to continue to walk for her overall health. She does not want to proceed with an epidural. We could consider a decompression at 4-5, possibly into 3-4 and 5-1 on the right as an option. She would have to commit to tobacco cessation and have clearance by her medical physician, Dr. Morrie Sheldon. She indicates she cannot live with her symptoms as they currently are. I did discuss using a cane at this point in time. Continue flexion exercise and strengthening exercises for her quads. She has had  therapy in the past. We provided her with illustrated handout and discussed it in detail. She would like to proceed with a decompression again pending clearance. She has one coming up with Dr. Laurance Flatten. I also indicated I would send him a note. She does have fairly severe stenosis and indicates it has been impressively worse in terms of limiting her capacity over the past two years.  I had an extensive discussion of the risks and benefits of the lumbar decompression with the patient including bleeding, infection, damage to neurovascular structures, epidural fibrosis, CSF leak requiring repair. We also discussed increase in pain, adjacent segment disease, recurrent disc herniation, need for future surgery including repeat decompression and/or fusion. We also discussed risks of postoperative hematoma, paralysis, anesthetic complications including DVT, PE, death, cardiopulmonary dysfunction. In addition, the perioperative and postoperative courses were discussed in detail including the rehabilitative time and return to functional activity and work. I provided the  patient with an illustrated handout and utilized the appropriate surgical models.  Plan microlumbar decompression L3-4, L4-5, possible L5-S1  Alieyah Spader M. PA-C for Dr. Tonita Cong 12/06/2013, 10:03 PM

## 2013-12-08 ENCOUNTER — Encounter (HOSPITAL_COMMUNITY): Payer: Self-pay | Admitting: General Practice

## 2013-12-08 ENCOUNTER — Ambulatory Visit (HOSPITAL_COMMUNITY): Payer: Medicare Other | Admitting: Certified Registered"

## 2013-12-08 ENCOUNTER — Ambulatory Visit (HOSPITAL_COMMUNITY): Payer: Medicare Other

## 2013-12-08 ENCOUNTER — Ambulatory Visit (HOSPITAL_COMMUNITY)
Admission: RE | Admit: 2013-12-08 | Discharge: 2013-12-09 | Disposition: A | Discharge status: Home / Self Care | Payer: Medicare Other | Source: Ambulatory Visit | Attending: Specialist | Admitting: Specialist

## 2013-12-08 ENCOUNTER — Encounter (HOSPITAL_COMMUNITY)
Admission: RE | Disposition: A | Discharge status: Home / Self Care | Payer: Self-pay | Source: Ambulatory Visit | Attending: Specialist

## 2013-12-08 DIAGNOSIS — J45909 Unspecified asthma, uncomplicated: Secondary | ICD-10-CM | POA: Diagnosis not present

## 2013-12-08 DIAGNOSIS — M5136 Other intervertebral disc degeneration, lumbar region: Secondary | ICD-10-CM | POA: Diagnosis not present

## 2013-12-08 DIAGNOSIS — M4806 Spinal stenosis, lumbar region: Secondary | ICD-10-CM | POA: Diagnosis not present

## 2013-12-08 DIAGNOSIS — F17210 Nicotine dependence, cigarettes, uncomplicated: Secondary | ICD-10-CM | POA: Insufficient documentation

## 2013-12-08 DIAGNOSIS — Z6835 Body mass index (BMI) 35.0-35.9, adult: Secondary | ICD-10-CM | POA: Diagnosis not present

## 2013-12-08 DIAGNOSIS — Z981 Arthrodesis status: Secondary | ICD-10-CM | POA: Diagnosis not present

## 2013-12-08 DIAGNOSIS — Z87440 Personal history of urinary (tract) infections: Secondary | ICD-10-CM | POA: Insufficient documentation

## 2013-12-08 DIAGNOSIS — M1990 Unspecified osteoarthritis, unspecified site: Secondary | ICD-10-CM | POA: Insufficient documentation

## 2013-12-08 DIAGNOSIS — G4730 Sleep apnea, unspecified: Secondary | ICD-10-CM | POA: Diagnosis not present

## 2013-12-08 DIAGNOSIS — Z91041 Radiographic dye allergy status: Secondary | ICD-10-CM | POA: Insufficient documentation

## 2013-12-08 DIAGNOSIS — M48061 Spinal stenosis, lumbar region without neurogenic claudication: Secondary | ICD-10-CM | POA: Diagnosis present

## 2013-12-08 DIAGNOSIS — E785 Hyperlipidemia, unspecified: Secondary | ICD-10-CM | POA: Insufficient documentation

## 2013-12-08 DIAGNOSIS — Z888 Allergy status to other drugs, medicaments and biological substances status: Secondary | ICD-10-CM | POA: Insufficient documentation

## 2013-12-08 DIAGNOSIS — Z91013 Allergy to seafood: Secondary | ICD-10-CM | POA: Diagnosis not present

## 2013-12-08 DIAGNOSIS — I10 Essential (primary) hypertension: Secondary | ICD-10-CM | POA: Diagnosis not present

## 2013-12-08 DIAGNOSIS — E119 Type 2 diabetes mellitus without complications: Secondary | ICD-10-CM | POA: Insufficient documentation

## 2013-12-08 DIAGNOSIS — Z419 Encounter for procedure for purposes other than remedying health state, unspecified: Secondary | ICD-10-CM

## 2013-12-08 HISTORY — PX: LUMBAR LAMINECTOMY/DECOMPRESSION MICRODISCECTOMY: SHX5026

## 2013-12-08 LAB — GLUCOSE, CAPILLARY
Glucose-Capillary: 131 mg/dL — ABNORMAL HIGH (ref 70–99)
Glucose-Capillary: 129 mg/dL — ABNORMAL HIGH (ref 70–99)
Glucose-Capillary: 182 mg/dL — ABNORMAL HIGH (ref 70–99)
Glucose-Capillary: 264 mg/dL — ABNORMAL HIGH (ref 70–99)

## 2013-12-08 LAB — TYPE AND SCREEN
ABO/RH(D): A POS
Antibody Screen: NEGATIVE

## 2013-12-08 SURGERY — LUMBAR LAMINECTOMY/DECOMPRESSION MICRODISCECTOMY 2 LEVELS
Anesthesia: General | Site: Back

## 2013-12-08 MED ORDER — BUPIVACAINE-EPINEPHRINE (PF) 0.5% -1:200000 IJ SOLN
INTRAMUSCULAR | Status: AC
  Filled 2013-12-08: qty 30

## 2013-12-08 MED ORDER — LIDOCAINE HCL (PF) 2 % IJ SOLN
INTRAMUSCULAR | Status: DC | PRN
  Administered 2013-12-08: 20 mg via INTRADERMAL

## 2013-12-08 MED ORDER — OXYCODONE-ACETAMINOPHEN 5-325 MG PO TABS
1.0000 | ORAL_TABLET | ORAL | Status: DC | PRN
  Administered 2013-12-09: 1 via ORAL
  Filled 2013-12-08: qty 1

## 2013-12-08 MED ORDER — ALBUTEROL SULFATE (2.5 MG/3ML) 0.083% IN NEBU
2.5000 mg | INHALATION_SOLUTION | Freq: Four times a day (QID) | RESPIRATORY_TRACT | Status: DC
  Administered 2013-12-08 – 2013-12-09 (×2): 2.5 mg via RESPIRATORY_TRACT
  Filled 2013-12-08 (×3): qty 3

## 2013-12-08 MED ORDER — PENICILLIN V POTASSIUM 500 MG PO TABS: 500.0000 mg | ORAL_TABLET | Freq: Four times a day (QID) | ORAL | Status: DC

## 2013-12-08 MED ORDER — EPHEDRINE SULFATE 50 MG/ML IJ SOLN
INTRAMUSCULAR | Status: AC
  Filled 2013-12-08: qty 1

## 2013-12-08 MED ORDER — ROCURONIUM BROMIDE 100 MG/10ML IV SOLN
INTRAVENOUS | Status: AC
  Filled 2013-12-08: qty 1

## 2013-12-08 MED ORDER — METHOCARBAMOL 500 MG PO TABS
500.0000 mg | ORAL_TABLET | Freq: Four times a day (QID) | ORAL | Status: DC | PRN
  Administered 2013-12-08 – 2013-12-09 (×2): 500 mg via ORAL
  Filled 2013-12-08 (×2): qty 1

## 2013-12-08 MED ORDER — OXYCODONE HCL 5 MG PO TABS: 5.0000 mg | ORAL_TABLET | Freq: Once | ORAL | Status: DC | PRN

## 2013-12-08 MED ORDER — SODIUM CHLORIDE 0.9 % IJ SOLN: 3.0000 mL | Freq: Two times a day (BID) | INTRAMUSCULAR | Status: DC

## 2013-12-08 MED ORDER — POTASSIUM CHLORIDE IN NACL 20-0.9 MEQ/L-% IV SOLN
INTRAVENOUS | Status: DC
  Administered 2013-12-08: 17:00:00 via INTRAVENOUS
  Filled 2013-12-08 (×2): qty 1000

## 2013-12-08 MED ORDER — MIDAZOLAM HCL 2 MG/2ML IJ SOLN
INTRAMUSCULAR | Status: AC
  Filled 2013-12-08: qty 2

## 2013-12-08 MED ORDER — ALBUTEROL SULFATE HFA 108 (90 BASE) MCG/ACT IN AERS: 1.0000 | INHALATION_SPRAY | Freq: Four times a day (QID) | RESPIRATORY_TRACT | Status: DC

## 2013-12-08 MED ORDER — FENTANYL CITRATE 0.05 MG/ML IJ SOLN
INTRAMUSCULAR | Status: AC
  Filled 2013-12-08: qty 5

## 2013-12-08 MED ORDER — OXYCODONE HCL 5 MG/5ML PO SOLN: 5.0000 mg | Freq: Once | ORAL | Status: DC | PRN

## 2013-12-08 MED ORDER — PHENOL 1.4 % MT LIQD
1.0000 | OROMUCOSAL | Status: DC | PRN
Start: 2013-12-08 — End: 2013-12-09

## 2013-12-08 MED ORDER — SODIUM CHLORIDE 0.9 % IR SOLN
Status: DC | PRN
  Administered 2013-12-08: 500 mL

## 2013-12-08 MED ORDER — ACETAMINOPHEN 325 MG PO TABS: 650.0000 mg | ORAL_TABLET | ORAL | Status: DC | PRN

## 2013-12-08 MED ORDER — CEFAZOLIN SODIUM-DEXTROSE 2-3 GM-% IV SOLR
INTRAVENOUS | Status: AC
  Filled 2013-12-08: qty 50

## 2013-12-08 MED ORDER — SODIUM CHLORIDE 0.9 % IJ SOLN: 3.0000 mL | INTRAMUSCULAR | Status: DC | PRN

## 2013-12-08 MED ORDER — ACETAMINOPHEN 325 MG PO TABS: 325.0000 mg | ORAL_TABLET | ORAL | Status: DC | PRN

## 2013-12-08 MED ORDER — FENTANYL CITRATE 0.05 MG/ML IJ SOLN
INTRAMUSCULAR | Status: DC | PRN
  Administered 2013-12-08: 50 ug via INTRAVENOUS
  Administered 2013-12-08: 100 ug via INTRAVENOUS
  Administered 2013-12-08 (×2): 50 ug via INTRAVENOUS

## 2013-12-08 MED ORDER — OXYCODONE-ACETAMINOPHEN 5-325 MG PO TABS: 1.0000 | ORAL_TABLET | ORAL | Status: DC | PRN

## 2013-12-08 MED ORDER — HYDROMORPHONE HCL 1 MG/ML IJ SOLN
INTRAMUSCULAR | Status: AC
  Filled 2013-12-08: qty 1

## 2013-12-08 MED ORDER — IRBESARTAN 300 MG PO TABS
300.0000 mg | ORAL_TABLET | Freq: Every day | ORAL | Status: DC
  Filled 2013-12-08: qty 1

## 2013-12-08 MED ORDER — PROPOFOL 10 MG/ML IV BOLUS
INTRAVENOUS | Status: AC
  Filled 2013-12-08: qty 20

## 2013-12-08 MED ORDER — SODIUM CHLORIDE 0.9 % IV SOLN: 250.0000 mL | INTRAVENOUS | Status: DC

## 2013-12-08 MED ORDER — HYDROCODONE-ACETAMINOPHEN 5-325 MG PO TABS
1.0000 | ORAL_TABLET | ORAL | Status: DC | PRN
  Administered 2013-12-08 – 2013-12-09 (×3): 2 via ORAL
  Filled 2013-12-08 (×3): qty 2

## 2013-12-08 MED ORDER — CEFAZOLIN SODIUM-DEXTROSE 2-3 GM-% IV SOLR
2.0000 g | Freq: Three times a day (TID) | INTRAVENOUS | Status: DC
  Administered 2013-12-08 – 2013-12-09 (×2): 2 g via INTRAVENOUS
  Filled 2013-12-08 (×3): qty 50

## 2013-12-08 MED ORDER — NEOSTIGMINE METHYLSULFATE 10 MG/10ML IV SOLN
INTRAVENOUS | Status: AC
  Filled 2013-12-08: qty 1

## 2013-12-08 MED ORDER — ACETAMINOPHEN 160 MG/5ML PO SOLN: 325.0000 mg | ORAL | Status: DC | PRN

## 2013-12-08 MED ORDER — CEFAZOLIN SODIUM-DEXTROSE 2-3 GM-% IV SOLR
2.0000 g | INTRAVENOUS | Status: AC
  Administered 2013-12-08: 2 g via INTRAVENOUS

## 2013-12-08 MED ORDER — THROMBIN 5000 UNITS EX SOLR
CUTANEOUS | Status: DC | PRN
  Administered 2013-12-08: 5000 [IU] via TOPICAL

## 2013-12-08 MED ORDER — PROPOFOL 10 MG/ML IV BOLUS
INTRAVENOUS | Status: DC | PRN
  Administered 2013-12-08: 150 mg via INTRAVENOUS

## 2013-12-08 MED ORDER — ACETAMINOPHEN 650 MG RE SUPP: 650.0000 mg | RECTAL | Status: DC | PRN

## 2013-12-08 MED ORDER — PANTOPRAZOLE SODIUM 40 MG PO TBEC
40.0000 mg | DELAYED_RELEASE_TABLET | Freq: Every day | ORAL | Status: DC
  Administered 2013-12-09: 40 mg via ORAL
  Filled 2013-12-08: qty 1

## 2013-12-08 MED ORDER — HYDROMORPHONE HCL 1 MG/ML IJ SOLN
0.2500 mg | INTRAMUSCULAR | Status: DC | PRN
  Administered 2013-12-08 (×2): 0.25 mg via INTRAVENOUS

## 2013-12-08 MED ORDER — GLYCOPYRROLATE 0.2 MG/ML IJ SOLN
INTRAMUSCULAR | Status: DC | PRN
  Administered 2013-12-08: 0.6 mg via INTRAVENOUS

## 2013-12-08 MED ORDER — TELMISARTAN-HCTZ 80-25 MG PO TABS: 1.0000 | ORAL_TABLET | Freq: Every day | ORAL | Status: DC

## 2013-12-08 MED ORDER — BUPIVACAINE-EPINEPHRINE (PF) 0.5% -1:200000 IJ SOLN
INTRAMUSCULAR | Status: DC | PRN
  Administered 2013-12-08: 11 mL

## 2013-12-08 MED ORDER — SODIUM CHLORIDE 0.9 % IR SOLN
Status: AC
  Filled 2013-12-08: qty 1

## 2013-12-08 MED ORDER — NEOSTIGMINE METHYLSULFATE 10 MG/10ML IV SOLN
INTRAVENOUS | Status: DC | PRN
  Administered 2013-12-08: 3.5 mg via INTRAVENOUS

## 2013-12-08 MED ORDER — DOCUSATE SODIUM 100 MG PO CAPS: 100.0000 mg | ORAL_CAPSULE | Freq: Two times a day (BID) | ORAL | Status: DC | PRN

## 2013-12-08 MED ORDER — DOCUSATE SODIUM 100 MG PO CAPS
100.0000 mg | ORAL_CAPSULE | Freq: Two times a day (BID) | ORAL | Status: DC
  Administered 2013-12-09: 100 mg via ORAL

## 2013-12-08 MED ORDER — LACTATED RINGERS IV SOLN
INTRAVENOUS | Status: DC
  Administered 2013-12-08: 1000 mL via INTRAVENOUS

## 2013-12-08 MED ORDER — THROMBIN 5000 UNITS EX SOLR
CUTANEOUS | Status: AC
  Filled 2013-12-08: qty 10000

## 2013-12-08 MED ORDER — MENTHOL 3 MG MT LOZG: 1.0000 | LOZENGE | OROMUCOSAL | Status: DC | PRN

## 2013-12-08 MED ORDER — METHOCARBAMOL 1000 MG/10ML IJ SOLN
500.0000 mg | Freq: Four times a day (QID) | INTRAVENOUS | Status: DC | PRN
  Administered 2013-12-08: 500 mg via INTRAVENOUS
  Filled 2013-12-08 (×2): qty 5

## 2013-12-08 MED ORDER — ROCURONIUM BROMIDE 100 MG/10ML IV SOLN
INTRAVENOUS | Status: DC | PRN
  Administered 2013-12-08: 40 mg via INTRAVENOUS

## 2013-12-08 MED ORDER — DEXAMETHASONE SODIUM PHOSPHATE 10 MG/ML IJ SOLN
INTRAMUSCULAR | Status: AC
  Filled 2013-12-08: qty 1

## 2013-12-08 MED ORDER — MIDAZOLAM HCL 5 MG/5ML IJ SOLN
INTRAMUSCULAR | Status: DC | PRN
  Administered 2013-12-08: 2 mg via INTRAVENOUS

## 2013-12-08 MED ORDER — LACTATED RINGERS IV SOLN
INTRAVENOUS | Status: DC
  Administered 2013-12-08 (×2): via INTRAVENOUS

## 2013-12-08 MED ORDER — GLYCOPYRROLATE 0.2 MG/ML IJ SOLN
INTRAMUSCULAR | Status: AC
  Filled 2013-12-08: qty 3

## 2013-12-08 MED ORDER — EPHEDRINE SULFATE 50 MG/ML IJ SOLN
INTRAMUSCULAR | Status: DC | PRN
  Administered 2013-12-08: 5 mg via INTRAVENOUS
  Administered 2013-12-08 (×3): 10 mg via INTRAVENOUS

## 2013-12-08 MED ORDER — ONDANSETRON HCL 4 MG/2ML IJ SOLN
INTRAMUSCULAR | Status: DC | PRN
  Administered 2013-12-08: 4 mg via INTRAVENOUS

## 2013-12-08 MED ORDER — VITAMIN D3 25 MCG (1000 UNIT) PO TABS
1000.0000 [IU] | ORAL_TABLET | Freq: Every day | ORAL | Status: DC
  Administered 2013-12-09: 1000 [IU] via ORAL
  Filled 2013-12-08 (×2): qty 1

## 2013-12-08 MED ORDER — ALUM & MAG HYDROXIDE-SIMETH 200-200-20 MG/5ML PO SUSP: 30.0000 mL | Freq: Four times a day (QID) | ORAL | Status: DC | PRN

## 2013-12-08 MED ORDER — MOMETASONE FURO-FORMOTEROL FUM 100-5 MCG/ACT IN AERO
2.0000 | INHALATION_SPRAY | Freq: Two times a day (BID) | RESPIRATORY_TRACT | Status: DC
  Administered 2013-12-08 – 2013-12-09 (×2): 2 via RESPIRATORY_TRACT
  Filled 2013-12-08: qty 8.8

## 2013-12-08 MED ORDER — SUCCINYLCHOLINE CHLORIDE 20 MG/ML IJ SOLN
INTRAMUSCULAR | Status: DC | PRN
  Administered 2013-12-08: 100 mg via INTRAVENOUS

## 2013-12-08 MED ORDER — ONDANSETRON HCL 4 MG/2ML IJ SOLN: 4.0000 mg | INTRAMUSCULAR | Status: DC | PRN

## 2013-12-08 MED ORDER — DEXAMETHASONE SODIUM PHOSPHATE 10 MG/ML IJ SOLN
INTRAMUSCULAR | Status: DC | PRN
  Administered 2013-12-08: 10 mg via INTRAVENOUS

## 2013-12-08 MED ORDER — HYDROCHLOROTHIAZIDE 25 MG PO TABS
25.0000 mg | ORAL_TABLET | Freq: Every day | ORAL | Status: DC
  Filled 2013-12-08: qty 1

## 2013-12-08 MED ORDER — HYDROMORPHONE HCL 1 MG/ML IJ SOLN: 0.5000 mg | INTRAMUSCULAR | Status: DC | PRN

## 2013-12-08 MED ORDER — INSULIN ASPART 100 UNIT/ML ~~LOC~~ SOLN: 0.0000 [IU] | Freq: Three times a day (TID) | SUBCUTANEOUS | Status: DC

## 2013-12-08 MED ORDER — ONDANSETRON HCL 4 MG/2ML IJ SOLN
INTRAMUSCULAR | Status: AC
  Filled 2013-12-08: qty 2

## 2013-12-08 MED ORDER — LORATADINE 10 MG PO TABS
10.0000 mg | ORAL_TABLET | Freq: Every day | ORAL | Status: DC
  Administered 2013-12-09: 10 mg via ORAL
  Filled 2013-12-08 (×2): qty 1

## 2013-12-08 SURGICAL SUPPLY — 47 items
BAG ZIPLOCK 12X15 (MISCELLANEOUS) IMPLANT
CHLORAPREP W/TINT 26ML (MISCELLANEOUS) ×3 IMPLANT
CLEANER TIP ELECTROSURG 2X2 (MISCELLANEOUS) ×3 IMPLANT
CLOSURE WOUND 1/2 X4 (GAUZE/BANDAGES/DRESSINGS) ×1
CLOTH 2% CHLOROHEXIDINE 3PK (PERSONAL CARE ITEMS) ×3 IMPLANT
DRAPE INCISE 23X17 IOBAN STRL (DRAPES) ×2
DRAPE INCISE IOBAN 23X17 STRL (DRAPES) ×1 IMPLANT
DRAPE MICROSCOPE LEICA (MISCELLANEOUS) ×3 IMPLANT
DRAPE POUCH INSTRU U-SHP 10X18 (DRAPES) ×3 IMPLANT
DRAPE SURG 17X11 SM STRL (DRAPES) ×3 IMPLANT
DRAPE UTILITY XL STRL (DRAPES) ×3 IMPLANT
DRSG AQUACEL AG ADV 3.5X 4 (GAUZE/BANDAGES/DRESSINGS) IMPLANT
DRSG AQUACEL AG ADV 3.5X 6 (GAUZE/BANDAGES/DRESSINGS) ×3 IMPLANT
DURAPREP 26ML APPLICATOR (WOUND CARE) IMPLANT
DURASEAL SPINE SEALANT 3ML (MISCELLANEOUS) IMPLANT
ELECT BLADE TIP CTD 4 INCH (ELECTRODE) ×3 IMPLANT
ELECT REM PT RETURN 9FT ADLT (ELECTROSURGICAL) ×3
ELECTRODE REM PT RTRN 9FT ADLT (ELECTROSURGICAL) ×1 IMPLANT
GLOVE BIOGEL PI IND STRL 7.5 (GLOVE) ×1 IMPLANT
GLOVE BIOGEL PI INDICATOR 7.5 (GLOVE) ×2
GLOVE SURG SS PI 7.5 STRL IVOR (GLOVE) ×3 IMPLANT
GLOVE SURG SS PI 8.0 STRL IVOR (GLOVE) ×18 IMPLANT
GOWN STRL REUS W/TWL XL LVL3 (GOWN DISPOSABLE) ×12 IMPLANT
IV CATH 14GX2 1/4 (CATHETERS) ×3 IMPLANT
KIT BASIN OR (CUSTOM PROCEDURE TRAY) ×3 IMPLANT
KIT POSITIONING SURG ANDREWS (MISCELLANEOUS) ×3 IMPLANT
MANIFOLD NEPTUNE II (INSTRUMENTS) ×3 IMPLANT
NEEDLE SPNL 18GX3.5 QUINCKE PK (NEEDLE) ×6 IMPLANT
PACK LAMINECTOMY ORTHO (CUSTOM PROCEDURE TRAY) ×3 IMPLANT
PATTIES SURGICAL .5 X.5 (GAUZE/BANDAGES/DRESSINGS) IMPLANT
PATTIES SURGICAL .75X.75 (GAUZE/BANDAGES/DRESSINGS) ×3 IMPLANT
PATTIES SURGICAL 1X1 (DISPOSABLE) ×3 IMPLANT
SPONGE SURGIFOAM ABS GEL 100 (HEMOSTASIS) ×3 IMPLANT
STAPLER VISISTAT (STAPLE) ×3 IMPLANT
STRIP CLOSURE SKIN 1/2X4 (GAUZE/BANDAGES/DRESSINGS) ×2 IMPLANT
SUT NURALON 4 0 TR CR/8 (SUTURE) IMPLANT
SUT PROLENE 3 0 PS 2 (SUTURE) IMPLANT
SUT VIC AB 1 CT1 27 (SUTURE) ×8
SUT VIC AB 1 CT1 27XBRD ANTBC (SUTURE) ×4 IMPLANT
SUT VIC AB 1-0 CT2 27 (SUTURE) IMPLANT
SUT VIC AB 2-0 CT1 27 (SUTURE) ×8
SUT VIC AB 2-0 CT1 TAPERPNT 27 (SUTURE) ×4 IMPLANT
SUT VIC AB 2-0 CT2 27 (SUTURE) IMPLANT
SYR 3ML LL SCALE MARK (SYRINGE) ×3 IMPLANT
TOWEL OR 17X26 10 PK STRL BLUE (TOWEL DISPOSABLE) ×3 IMPLANT
TOWEL OR NON WOVEN STRL DISP B (DISPOSABLE) ×3 IMPLANT
YANKAUER SUCT BULB TIP NO VENT (SUCTIONS) ×3 IMPLANT

## 2013-12-08 NOTE — Discharge Instructions (Signed)
Walk As Tolerated utilizing back precautions.  No bending, twisting, or lifting.  No driving for 2 weeks.   Aquacel dressing may remain in place until follow up. May shower with aquacel dressing in place. If the dressing is saturated or peels off at the edges, you may remove aquacel dressing and place gauze and tape dressing which should be kept clean and dry and changed daily. Do not remove steri-strips if they are present. See Dr. Tonita Cong in office in 10 to 14 days. Begin taking aspirin 81mg  per day starting 4 days after your surgery if not allergic to aspirin or on another blood thinner. Walk daily even outside. Use a cane or walker only if necessary. Avoid sitting on soft sofas.

## 2013-12-08 NOTE — Op Note (Signed)
Rita Payne, Rita Payne                 ACCOUNT NO.:  [id number]  MEDICAL RECORD NO.:  [id number]  LOCATION:  8937                         FACILITY:  [facility]  PHYSICIAN:  Susa Day, M.D.    DATE OF BIRTH:  Aug 20, 1945  DATE OF PROCEDURE:  12/08/2013 DATE OF DISCHARGE:                              OPERATIVE REPORT   PREOPERATIVE DIAGNOSIS:  Spinal stenosis at L4-5, L3-4.  POSTOPERATIVE DIAGNOSIS:  Spinal stenosis at L4-5, L3-4.  PROCEDURES PERFORMED: 1. Microlumbar decompression at L3-4 and L4-5. 2. Bilateral foraminotomies at L3, L4 and L5. 3. Bilateral hemilaminotomies at L4 and L5.  ANESTHESIA:  General.  ASSISTANT:  Cleophas Dunker, PA  HISTORY:  This is a 68 year old with neurogenic claudication secondary to severe spinal stenosis at L4-5 from severe disk degeneration at L3-4, L4-5 and L5-S1.  The patient had minimal back pain, predominantly claudication symptoms in the right lower extremity, L5 and L4 nerve root distribution, myotomal weakness, dermatome with dysesthesias, indicated for decompression following failure of conservative treatment.  Risk and benefits were discussed including bleeding, infection, damage to neurovascular structures, DVT, PE, anesthetic complications, etc.  TECHNIQUE:  With the patient in supine position, after induction of adequate general anesthesia, 2 g of Kefzol, she was placed prone on the Rhodhiss frame.  All bony prominences were well padded.  Lumbar region was prepped and draped in usual sterile fashion.  Two 18-gauge spinal needles were utilized to localize the L3-4 and L4-5 interspace. Incision was made from the spinous process above 3 to below 5. Subcutaneous tissue was dissected.  Electrocautery was utilized to achieve hemostasis.  A 0.25% Marcaine with epinephrine was infiltrating in the paraspinous musculature.  McCullough retractor was placed. Confirmatory radiograph obtained to remove the spinous processes of 4, partial of 3 and  5.  Operating microscope was draped, and brought on the surgical field.  Hemilaminotomy was then first performed of 4 with a 2- mm Kerrison centrally, full laminectomy of 4.  Then, decompression of lateral recesses was to the medial border of the pedicle, severe hypertrophic ligamentum and facet hypertrophy was noted at 4-5 and also at 3-4.  We performed foraminotomies.  Decompression of lateral recess to medial border of the pedicle at 3-4 as well.  Performing foraminotomies and undercutting of facets at 3-4 and at 4-5 with undercutting the foramen and performing foraminotomies at L3, L4 and L5. There was severe stenosis noted in the L5 nerve root distribution, and the stenosis and lateral recess was greater on the right and then on the left consistent with her clinical symptoms.  On exam, the disk spaces at 3-4 and 4-5 was hard bulging disk, no herniated ruptures.  A neuroprobe passed freely at the foramen, 3, 4 and 5 bilaterally after the decompression.  Good restoration of the thecal sac noted.  No evidence of CSF leakage.  Confirmatory radiograph obtained following decompression of 3-4 and L4-5 with neuroprobes in the foramen of 3 and 5.  Wound was copiously irrigated with antibiotic irrigation.  Thrombin- soaked Gelfoam was placed in laminotomy defect.  Bipolar electrocautery was utilized to achieve strict hemostasis.  We removed the [facility] retractors, copiously irrigated the paraspinous musculature, closed the dorsolumbar  fascia with 1 Vicryl interrupted figure-of-eight sutures, subcu with 2-0 and skin with staples.  Wound was dressed sterilely. Placed supine on the hospital bed, extubated without difficulty, and transported to the recovery room in satisfactory condition.  The patient tolerated the procedure well.  No complications.  Blood loss 50 mL.  Assistant, Cleophas Dunker, PA was required throughout the case for the patient's transport, intermittent neural  traction, positioning and closure.     Susa Day, M.D.     Geralynn Rile  D:  12/08/2013  T:  12/08/2013  Job:  [id number]

## 2013-12-08 NOTE — H&P (View-Only) (Signed)
Rita Payne is an 68 y.o. female.   Chief Complaint: back and leg pain HPI: The patient is a 68 year old female who presents with back pain. The patient is here today in referral from [name], PA-C for Dr. Alvan Dame. The patient reports low back symptoms without any known injury. Symptoms are reported to be located in the low back and Symptoms include pain. The pain radiates to the left buttock and right buttock. Symptoms are exacerbated by standing (and walking), lifting and bending. Current treatment includes non-opioid analgesics and activity modification. Prior to being seen today the patient was previously evaluated [name], PA-C. Past evaluation has included x-ray of the lumbar spine and MRI of the lumbar spine. Past treatment has included non-opioid analgesics.  Rita Payne reports she has had a very significantly difficulty with walking. This has been progressive over the past two years. She lives alone. She is unable to get out to the mailbox without having to sit. It radiates down both legs, left and right. She is having hip pain as well. Some back pain but more radiation into the buttocks and hip. She was seen by [name] and had a MRI of her spine which indicated severe stenosis at 4-5, multifactorial. There is severe disc degeneration at 3-4 and at 5-1 and facet arthrosis mild to moderate right foraminal stenosis. Disc degeneration at 2-3 as well. She has had a hip operation. She has had physical therapy, activity modification, strategies to avoid reinjury. She has been having persistent symptoms. She is unable to walk. She has done well from her hip replacement. This is limiting her significantly.  Past Medical History  Diagnosis Date  . Asthma   . Hypertension   . Hyperlipidemia   . Arthritis   . Pneumonia     hx of   . Diabetes mellitus without complication     type II not on meds 5.9 AIC on 07/14/12   . Sleep apnea     mild sleep apnea 10 years ago not on CPAP   .  Kidney stones     hx of   . UTI (urinary tract infection)     hx of   . Teeth problem     molar tooth recent "broke off " tx. with antibiotic.    Past Surgical History  Procedure Laterality Date  . Tonsillectomy    . Abdominal hysterectomy    . Cholecystectomy    . Knee arthroscopy      bilateral knees  . Hernia repair    . Kidney tumor removal       1986  . Belpharoptosis repair      bilateral - 2009   . Total hip arthroplasty Right 08/25/2012    Procedure: RIGHT TOTAL HIP ARTHROPLASTY ANTERIOR APPROACH;  Surgeon: [name], MD;  Location: [facility];  Service: Orthopedics;  Laterality: Right;  . Joint replacement      Family History  Problem Relation Age of Onset  . Diabetes Mother   . Heart disease Mother   . Stroke Mother   . Heart disease Father   . Stroke Father   . Vision loss Brother   . Diabetes Sister   . Diabetes Sister   . Stroke Sister   . Heart disease Brother    Social History:  reports that she has been smoking Cigarettes.  She has a 1.5 pack-year smoking history. She has never used smokeless tobacco. She reports that she does not drink alcohol or use illicit  drugs.  Allergies:  Allergies  Allergen Reactions  . Adhesive [Tape]     blisters  . Iodine Other (See Comments)    Iodine that is applied to skin causes blisters   . Ivp Dye [Iodinated Diagnostic Agents]     unknown  . Pyridium [Phenazopyridine Hcl]     unknown  . Shellfish Allergy     unknown  . Vasotec [Enalapril]     unknown     (Not in a hospital admission)  No results found for this or any previous visit (from the past 48 hour(s)). No results found.  Review of Systems  Constitutional: Negative.   HENT: Negative.   Eyes: Negative.   Respiratory: Negative.   Cardiovascular: Negative.   Gastrointestinal: Negative.   Genitourinary: Negative.   Musculoskeletal: Positive for back pain.  Skin: Negative.   Neurological: Positive for focal weakness.  Psychiatric/Behavioral:  Negative.     Last menstrual period 04/14/1982. Physical Exam  Constitutional: She is oriented to person, place, and time. She appears well-developed and well-nourished.  HENT:  Head: Normocephalic and atraumatic.  Eyes: Conjunctivae and EOM are normal. Pupils are equal, round, and reactive to light.  Neck: Normal range of motion. Neck supple.  Cardiovascular: Normal rate and regular rhythm.   Respiratory: Effort normal and breath sounds normal.  GI: Soft. Bowel sounds are normal.  Musculoskeletal:  She walks with a forward flexed antalgic gait. She is unable to fully extend with extension and develops pain in the buttock and posterior thigh. Straight leg raise produces buttock pain bilaterally. She has quad weakness bilaterally at 5-/5 as well as dorsiflexion, weakness, and EHL. Limited flexion and extension. Lumbar spine exam reveals no evidence of soft tissue swelling, ecchymosis or deformity. On palpation there is no flank pain with percussion. Nontender over the trochanters.  Neurological: She is alert and oriented to person, place, and time. She has normal reflexes.  Skin: Skin is warm and dry.  Psychiatric: She has a normal mood and affect.    Three view radiographs lumbar spine demonstrates multilevel disc degeneration at 3-4, 4-5, and 5-1. No instability with flexion and extension. Prosthesis is well placed.  MRI demonstrates severe multifactorial stenosis at 4-5, moderate at 3-4. On AP radiograph there is mild scoliosis noted  Assessment/Plan Spinal stenosis 1. Neurogenic claudication secondary to severe spinal stenosis at 4-5 into 3-4, possibly at 5-1 on the right. 2. Secondary back pain.  I had extensive discussion with Rita Payne concerning current pathology, relevant anatomy, and treatment options. She feels her symptoms are consistent with neurogenic claudication secondary to severe spinal stenosis at 4-5 limiting her ambulatory capacity. She does have back pain  secondary to multilevel disc degeneration and facet arthropathy but her ambulatory function and claudication symptoms which she is unable to tolerate. She can tolerate the back pain. We discussed options of epidural steroid injection. Living with her symptoms, cane, forward flexion, sitting, stationary bike, and so forth she indicates she is very active and wants to continue to walk for her overall health. She does not want to proceed with an epidural. We could consider a decompression at 4-5, possibly into 3-4 and 5-1 on the right as an option. She would have to commit to tobacco cessation and have clearance by her medical physician, Dr. Morrie Sheldon. She indicates she cannot live with her symptoms as they currently are. I did discuss using a cane at this point in time. Continue flexion exercise and strengthening exercises for her quads. She has had  therapy in the past. We provided her with illustrated handout and discussed it in detail. She would like to proceed with a decompression again pending clearance. She has one coming up with Dr. Laurance Flatten. I also indicated I would send him a note. She does have fairly severe stenosis and indicates it has been impressively worse in terms of limiting her capacity over the past two years.  I had an extensive discussion of the risks and benefits of the lumbar decompression with the patient including bleeding, infection, damage to neurovascular structures, epidural fibrosis, CSF leak requiring repair. We also discussed increase in pain, adjacent segment disease, recurrent disc herniation, need for future surgery including repeat decompression and/or fusion. We also discussed risks of postoperative hematoma, paralysis, anesthetic complications including DVT, PE, death, cardiopulmonary dysfunction. In addition, the perioperative and postoperative courses were discussed in detail including the rehabilitative time and return to functional activity and work. I provided the  patient with an illustrated handout and utilized the appropriate surgical models.  Plan microlumbar decompression L3-4, L4-5, possible L5-S1  Rita Payne M. PA-C for Dr. Tonita Cong 12/06/2013, 10:03 PM

## 2013-12-08 NOTE — Anesthesia Postprocedure Evaluation (Signed)
  Anesthesia Post-op Note  Patient: Rita Payne  Procedure(s) Performed: Procedure(s): LUMBAR DECOMPRESSION L4-L5,  L3-L4  (N/A)  Patient Location: PACU  Anesthesia Type:General  Level of Consciousness: awake and alert   Airway and Oxygen Therapy: Patient Spontanous Breathing  Post-op Pain: mild  Post-op Assessment: Post-op Vital signs reviewed, Patient's Cardiovascular Status Stable, Respiratory Function Stable, Patent Airway, No signs of Nausea or vomiting and Pain level controlled  Post-op Vital Signs: Reviewed and stable  Last Vitals:  Filed Vitals:   12/08/13 1533  BP: 103/58  Pulse: 94  Temp: 36.8 C  Resp: 18    Complications: No apparent anesthesia complications

## 2013-12-08 NOTE — Interval H&P Note (Signed)
History and Physical Interval Note:  12/08/2013 8:40 AM  [name]  has presented today for surgery, with the diagnosis of STENOSIS  L4-L5, L3-L4, L5-S1   The various methods of treatment have been discussed with the patient and family. After consideration of risks, benefits and other options for treatment, the patient has consented to  Procedure(s): LUMBAR DECOMPRESSION L4-L5,  L3-L4, POSSIBLE L5-S1  (N/A) as a surgical intervention .  The patient's history has been reviewed, patient examined, no change in status, stable for surgery.  I have reviewed the patient's chart and labs.  Questions were answered to the patient's satisfaction.     Rita Payne C

## 2013-12-08 NOTE — Anesthesia Preprocedure Evaluation (Addendum)
Anesthesia Evaluation  Patient identified by MRN, date of birth, ID band Patient awake    Reviewed: Allergy & Precautions, H&P , NPO status , Patient's Chart, lab work & pertinent test results  History of Anesthesia Complications Negative for: history of anesthetic complications  Airway Mallampati: II  TM Distance: >3 FB Neck ROM: Full    Dental  (+) Edentulous Upper,    Pulmonary asthma , neg sleep apnea, Current Smoker,  breath sounds clear to auscultation        Cardiovascular hypertension, Pt. on medications - angina- Past MI and - CHF - dysrhythmias - Valvular Problems/MurmursRhythm:Regular     Neuro/Psych  Neuromuscular disease negative psych ROS   GI/Hepatic Neg liver ROS, GERD-  Medicated and Controlled,  Endo/Other  diabetes, Well Controlled, Type 2Morbid obesity  Renal/GU negative Renal ROS     Musculoskeletal  (+) Arthritis -, Osteoarthritis,    Abdominal   Peds  Hematology negative hematology ROS (+)   Anesthesia Other Findings   Reproductive/Obstetrics                            Anesthesia Physical Anesthesia Plan  ASA: III  Anesthesia Plan: General   Post-op Pain Management:    Induction: Intravenous  Airway Management Planned: Oral ETT  Additional Equipment: None  Intra-op Plan:   Post-operative Plan: Extubation in OR  Informed Consent: I have reviewed the patients History and Physical, chart, labs and discussed the procedure including the risks, benefits and alternatives for the proposed anesthesia with the patient or authorized representative who has indicated his/her understanding and acceptance.   Dental advisory given  Plan Discussed with: CRNA and Surgeon  Anesthesia Plan Comments:         Anesthesia Quick Evaluation

## 2013-12-08 NOTE — Brief Op Note (Signed)
12/08/2013  1:05 PM  PATIENT:  Rita Payne  68 y.o. female  PRE-OPERATIVE DIAGNOSIS:  STENOSIS  L4-L5, L3-L4, L5-S1   POST-OPERATIVE DIAGNOSIS:  spinal stenosis L3-4, L4-5, L5-S1  PROCEDURE:  Procedure(s): LUMBAR DECOMPRESSION L4-L5,  L3-L4  (N/A)  SURGEON:  Surgeon(s) and Role:    * Johnn Hai, MD - Primary  PHYSICIAN ASSISTANT:   ASSISTANTS: [place]   ANESTHESIA:   general  EBL:  Total I/O In: 1000 [I.V.:1000] Out: 100 [Urine:100]  BLOOD ADMINISTERED:none  DRAINS: none   LOCAL MEDICATIONS USED:  MARCAINE     SPECIMEN:  No Specimen  DISPOSITION OF SPECIMEN:  N/A  COUNTS:  YES  TOURNIQUET:  * No tourniquets in log *  DICTATION: .Other Dictation: Dictation Number [id number]  PLAN OF CARE: Admit for overnight observation  PATIENT DISPOSITION:  PACU - hemodynamically stable.   Delay start of Pharmacological VTE agent (>24hrs) due to surgical blood loss or risk of bleeding: yes

## 2013-12-08 NOTE — Plan of Care (Signed)
Problem: Consults Goal: Diagnosis - Spinal Surgery Decompression laminectomy  Problem: Phase I Progression Outcomes Goal: Pain controlled with appropriate interventions Outcome: Completed/Met Date Met:  12/08/13 Goal: OOB as tolerated unless otherwise ordered Outcome: Progressing Goal: Log roll for position change Outcome: Completed/Met Date Met:  12/08/13 Goal: Initial discharge plan identified Outcome: Completed/Met Date Met:  12/08/13 Goal: PT/OT consults requested Outcome: Completed/Met Date Met:  12/08/13 Goal: Hemodynamically stable Outcome: Completed/Met Date Met:  12/08/13 Goal: Other Phase I Outcomes/Goals Outcome: Completed/Met Date Met:  12/08/13

## 2013-12-08 NOTE — Transfer of Care (Signed)
Immediate Anesthesia Transfer of Care Note  Patient: Rita Payne  Procedure(s) Performed: Procedure(s) (LRB): LUMBAR DECOMPRESSION L4-L5,  L3-L4  (N/A)  Patient Location: PACU  Anesthesia Type: General  Level of Consciousness: sedated, patient cooperative and responds to stimulation  Airway & Oxygen Therapy: Patient Spontanous Breathing and Patient connected to face mask oxgen  Post-op Assessment: Report given to PACU RN and Post -op Vital signs reviewed and stable  Post vital signs: Reviewed and stable  Complications: No apparent anesthesia complications

## 2013-12-09 ENCOUNTER — Encounter (HOSPITAL_COMMUNITY): Payer: Self-pay | Admitting: Specialist

## 2013-12-09 DIAGNOSIS — M4806 Spinal stenosis, lumbar region: Secondary | ICD-10-CM | POA: Diagnosis not present

## 2013-12-09 LAB — BASIC METABOLIC PANEL
Anion gap: 16 — ABNORMAL HIGH (ref 5–15)
BUN: 16 mg/dL (ref 6–23)
CALCIUM: 9.2 mg/dL (ref 8.4–10.5)
CO2: 25 meq/L (ref 19–32)
CREATININE: 0.85 mg/dL (ref 0.50–1.10)
Chloride: 97 mEq/L (ref 96–112)
GFR calc Af Amer: 81 mL/min — ABNORMAL LOW (ref >90)
GFR, EST NON AFRICAN AMERICAN: 70 mL/min — AB (ref >90)
Glucose, Bld: 180 mg/dL — ABNORMAL HIGH (ref 70–99)
Potassium: 4 mEq/L (ref 3.7–5.3)
SODIUM: 138 meq/L (ref 137–147)

## 2013-12-09 LAB — CBC
HCT: 35.9 % — ABNORMAL LOW (ref 36.0–46.0)
Hemoglobin: 11.8 g/dL — ABNORMAL LOW (ref 12.0–15.0)
MCH: 30.1 pg (ref 26.0–34.0)
MCHC: 32.9 g/dL (ref 30.0–36.0)
MCV: 91.6 fL (ref 78.0–100.0)
Platelets: 226 10*3/uL (ref 150–400)
RBC: 3.92 MIL/uL (ref 3.87–5.11)
RDW: 14.6 % (ref 11.5–15.5)
WBC: 13.4 10*3/uL — ABNORMAL HIGH (ref 4.0–10.5)

## 2013-12-09 LAB — GLUCOSE, CAPILLARY: Glucose-Capillary: 201 mg/dL — ABNORMAL HIGH (ref 70–99)

## 2013-12-09 MED ORDER — CELECOXIB 200 MG PO CAPS: ORAL_CAPSULE | ORAL | Status: DC

## 2013-12-09 MED ORDER — ASPIRIN 81 MG PO TABS: 81.0000 mg | ORAL_TABLET | Freq: Every day | ORAL | Status: DC

## 2013-12-09 NOTE — Progress Notes (Signed)
CSW consulted for SNF placement. PN reviewed. PT / OT are not recommending any follow up at this time. Pt will d/c home today. CSW signing off.  Werner Lean LCSW [phone]

## 2013-12-09 NOTE — Plan of Care (Signed)
Problem: Discharge Progression Outcomes Goal: Barriers To Progression Addressed/Resolved Outcome: Not Applicable Date Met:  18/98/42 Goal: Discharge plan in place and appropriate Outcome: Completed/Met Date Met:  12/09/13 Goal: Pain controlled with appropriate interventions Outcome: Completed/Met Date Met:  12/09/13 Goal: Hemodynamically stable Outcome: Completed/Met Date Met:  11/07/26 Goal: Complications resolved/controlled Outcome: Completed/Met Date Met:  12/09/13 Goal: Tolerates diet Outcome: Completed/Met Date Met:  12/09/13 Goal: Incision without S/S infection Outcome: Completed/Met Date Met:  12/09/13 Goal: Ambulates without assistance Outcome: Completed/Met Date Met:  12/09/13 Goal: Demonstrates proper use of assistive devices Outcome: Completed/Met Date Met:  12/09/13

## 2013-12-09 NOTE — Evaluation (Signed)
Physical Therapy Evaluation Patient Details Name: Rita Payne MRN: [id number] DOB: [birth year] Today's Date: 12/09/2013   History of Present Illness  PROCEDURES PERFORMED:micro decompression, foraminotimies of L34, L45  Clinical Impression  Patient tolerated well, cues for posture, tends to forward lean. Patient has caregiver support. No further PT needs at this time.    Follow Up Recommendations No PT follow up;Supervision/Assistance - 24 hour    Equipment Recommendations  None recommended by PT    Recommendations for Other Services       Precautions / Restrictions Precautions Precautions: Fall;Back Precaution Comments: handout provided, reviewed back precautions      Mobility  Bed Mobility Overal bed mobility: Needs Assistance Bed Mobility: Rolling;Sidelying to Sit Rolling: Min assist Sidelying to sit: Min assist       General bed mobility comments: cues for precautions,  Transfers Overall transfer level: Needs assistance Equipment used: Rolling walker (2 wheeled) Transfers: Sit to/from Stand Sit to Stand: Min guard         General transfer comment: verbal cues for hand placement and back precautions  Ambulation/Gait Ambulation/Gait assistance: Min guard Ambulation Distance (Feet): 150 Feet Assistive device: Rolling walker (2 wheeled)       General Gait Details: cues for posture, position inside of RW  Stairs            Wheelchair Mobility    Modified Rankin (Stroke Patients Only)       Balance                                             Pertinent Vitals/Pain Pain Assessment: 0-10 Pain Score: 2  Pain Location: back- at incision Pain Descriptors / Indicators: Aching;Sore Pain Intervention(s): Repositioned;Monitored during session    Home Living Family/patient expects to be discharged to:: Private residence Living Arrangements: Children;Other relatives Available Help at Discharge: Family Type of Home: House Home  Access: Ramped entrance     Home Layout: One level Home Equipment: [place] - 2 wheels;Bedside commode      Prior Function Level of Independence: Independent with assistive device(s)         Comments: used RW PTA     Hand Dominance        Extremity/Trunk Assessment   Upper Extremity Assessment: Generalized weakness                     Communication   Communication: No difficulties  Cognition Arousal/Alertness: Awake/alert Behavior During Therapy: WFL for tasks assessed/performed Overall Cognitive Status: Within Functional Limits for tasks assessed                      General Comments      Exercises        Assessment/Plan    PT Assessment Patent does not need any further PT services  PT Diagnosis     PT Problem List    PT Treatment Interventions     PT Goals (Current goals can be found in the Care Plan section) Acute Rehab PT Goals Patient Stated Goal: to go home PT Goal Formulation: All assessment and education complete, DC therapy    Frequency     Barriers to discharge        Co-evaluation               End of Session   Activity Tolerance: Patient tolerated treatment  well Patient left: in chair;with call bell/phone within reach Nurse Communication: Mobility status    Functional Assessment Tool Used: clinical judgement Functional Limitation: Mobility: Walking and moving around Mobility: Walking and Moving Around Current Status ([id number]): At least 1 percent but less than 20 percent impaired, limited or restricted Mobility: Walking and Moving Around Goal Status [phone]): At least 1 percent but less than 20 percent impaired, limited or restricted Mobility: Walking and Moving Around Discharge Status [phone]): At least 1 percent but less than 20 percent impaired, limited or restricted    Time: 0800-0835 PT Time Calculation (min) (ACUTE ONLY): 35 min   Charges:   PT Evaluation $Initial PT Evaluation Tier I: 1 Procedure PT  Treatments $Gait Training: 23-37 mins   PT G Codes:   Functional Assessment Tool Used: clinical judgement Functional Limitation: Mobility: Walking and moving around    [place] 12/09/2013, 9:53 AM Tresa Endo PT [phone]

## 2013-12-09 NOTE — Evaluation (Signed)
Occupational Therapy Evaluation Patient Details Name: Zoila Ditullio MRN: [id number] DOB: November 13, 1945 Today's Date: December 31, 2013    History of Present Illness PROCEDURES PERFORMED:micro decompression, foraminotimies of L34, L45   Clinical Impression   Patient is s/p back  Surgery. OT education complete.    Follow Up Recommendations  No OT follow up    Equipment Recommendations  None recommended by OT       Precautions / Restrictions Precautions Precautions: Fall;Back Precaution Comments: handout provided, reviewed back precautions      Mobility Bed Mobility               General bed mobility comments: pt in chair  Transfers Overall transfer level: Needs assistance Equipment used: Rolling walker (2 wheeled) Transfers: Sit to/from Stand Sit to Stand: Min guard         General transfer comment: verbal cues for hand placement and back precautions         ADL Overall ADL's : Needs assistance/impaired Eating/Feeding: Supervision/ safety;Sitting   Grooming: Set up;Sitting   Upper Body Bathing: Sitting;Set up   Lower Body Bathing: Minimal assistance;Sit to/from stand;Cueing for back precautions   Upper Body Dressing : Set up;Standing   Lower Body Dressing: Minimal assistance;Sit to/from stand;Cueing for back precautions       Toileting- Clothing Manipulation and Hygiene: Sit to/from stand;Supervision/safety   Tub/ Shower Transfer: Min guard   Functional mobility during ADLs: Min guard;Rolling walker General ADL Comments: cues for back precautions               Pertinent Vitals/Pain Pain Assessment: 0-10 Pain Score: 2  Pain Location: back- at incision Pain Descriptors / Indicators: Aching;Sore Pain Intervention(s): Repositioned;Monitored during session        Extremity/Trunk Assessment Upper Extremity Assessment Upper Extremity Assessment: Generalized weakness           Communication Communication Communication: No difficulties    Cognition Arousal/Alertness: Awake/alert Behavior During Therapy: WFL for tasks assessed/performed Overall Cognitive Status: Within Functional Limits for tasks assessed                     General Comments   pt did well.  Education complete regarding back precautions s/p back surgery            Home Living Family/patient expects to be discharged to:: Private residence Living Arrangements: Children;Other relatives Available Help at Discharge: Family Type of Home: House Home Access: Ramped entrance     Home Layout: One level     Bathroom Shower/Tub: Walk-in shower;Curtain         Home Equipment: Environmental consultant - 2 wheels;Bedside commode          Prior Functioning/Environment Level of Independence: Independent with assistive device(s)        Comments: used RW PTA                              End of Session Equipment Utilized During Treatment: Surveyor, mining Communication: Mobility status  Activity Tolerance: Patient tolerated treatment well Patient left: in chair;with call bell/phone within reach   Time: 0901-0935 OT Time Calculation (min): 34 min Charges:  OT General Charges $OT Visit: 1 Procedure OT Evaluation $Initial OT Evaluation Tier I: 1 Procedure OT Treatments $Self Care/Home Management : 8-22 mins G-Codes:    Payton Mccallum D 31-Dec-2013, 9:47 AM

## 2013-12-09 NOTE — Discharge Summary (Signed)
Physician Discharge Summary   Patient ID: Rita Payne MRN: [id number] DOB/AGE: [birth year] 68 y.o.  Admit date: 12/08/2013 Discharge date: 12/09/2013  Primary Diagnosis:   STENOSIS  L4-L5, L3-L4, L5-S1   Admission Diagnoses:  Past Medical History  Diagnosis Date  . Asthma   . Hypertension   . Hyperlipidemia   . Arthritis   . Pneumonia     hx of   . Diabetes mellitus without complication     type II not on meds 5.9 AIC on 07/14/12   . Sleep apnea     mild sleep apnea 10 years ago not on CPAP   . Kidney stones     hx of   . UTI (urinary tract infection)     hx of   . Teeth problem     molar tooth recent "broke off " tx. with antibiotic.   Discharge Diagnoses:   Active Problems:   Spinal stenosis of lumbar region at multiple levels  Procedure:  Procedure(s) (LRB): LUMBAR DECOMPRESSION L4-L5,  L3-L4  (N/A)   Consults: None  HPI:  see H&P    Laboratory Data: Hospital Outpatient Visit on 11/30/2013  Component Date Value Ref Range Status  . MRSA, PCR 11/30/2013 NEGATIVE  NEGATIVE Final  . Staphylococcus aureus 11/30/2013 NEGATIVE  NEGATIVE Final   Comment:        The Xpert SA Assay (FDA approved for NASAL specimens in patients over 40 years of age), is one component of a comprehensive surveillance program.  Test performance has been validated by [employer] for patients greater than or equal to 66 year old. It is not intended to diagnose infection nor to guide or monitor treatment.   . Sodium 11/30/2013 136* 137 - 147 mEq/L Final  . Potassium 11/30/2013 4.2  3.7 - 5.3 mEq/L Final  . Chloride 11/30/2013 97  96 - 112 mEq/L Final  . CO2 11/30/2013 26  19 - 32 mEq/L Final  . Glucose, Bld 11/30/2013 159* 70 - 99 mg/dL Final  . BUN 11/30/2013 15  6 - 23 mg/dL Final  . Creatinine, Ser 11/30/2013 0.76  0.50 - 1.10 mg/dL Final  . Calcium 11/30/2013 9.8  8.4 - 10.5 mg/dL Final  . GFR calc non Af Amer 11/30/2013 86* >90 mL/min Final  . GFR calc Af Amer 11/30/2013  >90  >90 mL/min Final   Comment: (NOTE) The eGFR has been calculated using the CKD EPI equation. This calculation has not been validated in all clinical situations. eGFR's persistently <90 mL/min signify possible Chronic Kidney Disease.   . Anion gap 11/30/2013 13  5 - 15 Final  . WBC 11/30/2013 7.7  4.0 - 10.5 K/uL Final  . RBC 11/30/2013 4.45  3.87 - 5.11 MIL/uL Final  . Hemoglobin 11/30/2013 13.8  12.0 - 15.0 g/dL Final  . HCT 11/30/2013 40.8  36.0 - 46.0 % Final  . MCV 11/30/2013 91.7  78.0 - 100.0 fL Final  . MCH 11/30/2013 31.0  26.0 - 34.0 pg Final  . MCHC 11/30/2013 33.8  30.0 - 36.0 g/dL Final  . RDW 11/30/2013 14.5  11.5 - 15.5 % Final  . Platelets 11/30/2013 196  150 - 400 K/uL Final    Recent Labs  12/09/13 0506  HGB 11.8*    Recent Labs  12/09/13 0506  WBC 13.4*  RBC 3.92  HCT 35.9*  PLT 226    Recent Labs  12/09/13 0506  NA 138  K 4.0  CL 97  CO2 25  BUN 16  CREATININE 0.85  GLUCOSE 180*  CALCIUM 9.2   No results for input(s): LABPT, INR in the last 72 hours.  X-Rays:Dg Chest 2 View  11/30/2013   CLINICAL DATA:  Preoperative films.  Hypertension.  EXAM: CHEST  2 VIEW  COMPARISON:  PA and lateral chest 08/14/2012.  FINDINGS: Mild eventration of the anterior right hemidiaphragm is unchanged. The lungs are clear. Heart size is normal. No pneumothorax or pleural effusion.  IMPRESSION: No acute disease.   Electronically Signed   By: Inge Rise M.D.   On: 11/30/2013 13:19   Dg Lumbar Spine 2-3 Views  11/30/2013   CLINICAL DATA:  Preoperative exam prior to lumbar surgery; history of spinal stenosis  EXAM: LUMBAR SPINE - 2-3 VIEW  COMPARISON:  Lumbar spine MRI of October 22, 2013  FINDINGS: The lumbar vertebral bodies are preserved in height. There is high-grade disc space narrowing at L3-4, L4-5, and L5-S1. There is moderate facet joint hypertrophy at L5-S1. There is no spondylolisthesis. The pedicles and transverse processes are intact. The observed  portions of the sacrum are unremarkable.  IMPRESSION: There is high-grade disc space narrowing at L3-4, L4-5, and L5-S1.   Electronically Signed   By: David  Martinique   On: 11/30/2013 13:50   Dg Spine Portable 1 View  12/08/2013   CLINICAL DATA:  Lumbar decompression  EXAM: PORTABLE SPINE - 1 VIEW  COMPARISON:  Previous intraoperative lumbar spine films for localization  FINDINGS: One instrument for localization is projected cephalad toward the L3-4 interspace with a second instrument for localization directed caudally toward the L5-S1 interspace.  IMPRESSION: Instruments localize the L3-4 and L5-S1 disc spaces.   Electronically Signed   By: Ivar Drape M.D.   On: 12/08/2013 13:07   Dg Spine Portable 1 View  12/08/2013   CLINICAL DATA:  Intraoperative localization film. Patient for L3-4, L4-5 and possible L5-S1 decompression.  EXAM: PORTABLE SPINE - 1 VIEW  COMPARISON:  Intraoperative localization film 12/08/2013.  FINDINGS: Clamp is identified projecting over the inferior margin of the L3 spinous process and superior aspect of the L4 spinous process. Probes are in place and directed toward the level of the L4 pedicles and L4-5 disc interspace.  IMPRESSION: Localization as above.   Electronically Signed   By: Inge Rise M.D.   On: 12/08/2013 12:43   Dg Spine Portable 1 View  12/08/2013   CLINICAL DATA:  Lumbar spine surgery  EXAM: PORTABLE SPINE - 1 VIEW  COMPARISON:  Lumbar spine films of 11/30/2013  FINDINGS: A lateral view of the lumbar spine labeled image 1 was returned. Needles are positioned posteriorly, the more cephalad directed toward the inferior spinous process of L3 with the more caudal needle directed toward the inferior spinous process of L4. Degenerative disc disease is noted at L3-4, L4-5 and L5-S1 levels.  IMPRESSION: Needles positioned posteriorly for localization are directed toward the inferior spinous processes of L3 and L4.   Electronically Signed   By: Ivar Drape M.D.   On:  12/08/2013 12:20    EKG: Orders placed or performed in visit on 09/17/13  . EKG 12-Lead     Hospital Course: Patient was admitted to [facility] and taken to the OR and underwent the above state procedure without complications.  Patient tolerated the procedure well and was later transferred to the recovery room and then to the orthopaedic floor for postoperative care.  They were given PO and IV analgesics for pain control following their surgery.  They were  given 24 hours of postoperative antibiotics.   PT was consulted postop to assist with mobility and transfers.  The patient was allowed to be WBAT with therapy and was taught back precautions. Discharge planning was consulted to help with postop disposition and equipment needs.  Patient had a good night on the evening of surgery and started to get up OOB with therapy on day one. Patient was seen in rounds and was ready to go home on day one.  They were given discharge instructions and dressing directions.  They were instructed on when to follow up in the office with Dr. Tonita Cong.   Diet: Regular diet Activity:WBAT Follow-up:in 10-14 days Disposition - Home Discharged Condition: good   Discharge Instructions    Call MD / Call 911    Complete by:  As directed   If you experience chest pain or shortness of breath, CALL 911 and be transported to the hospital emergency room.  If you develope a fever above 101 F, pus (white drainage) or increased drainage or redness at the wound, or calf pain, call your surgeon's office.     Constipation Prevention    Complete by:  As directed   Drink plenty of fluids.  Prune juice may be helpful.  You may use a stool softener, such as Colace (over the counter) 100 mg twice a day.  Use MiraLax (over the counter) for constipation as needed.     Diet - low sodium heart healthy    Complete by:  As directed      Increase activity slowly as tolerated    Complete by:  As directed             Medication  List    TAKE these medications        albuterol 108 (90 BASE) MCG/ACT inhaler  Commonly known as:  PROAIR HFA  INHALE 2 PUFFS INTO THE LUNGS EVERY 6 HOURS AS NEEDED FOR WHEEZING     aspirin 81 MG tablet  Take 1 tablet (81 mg total) by mouth daily. Resume 4 days post-op     celecoxib 200 MG capsule  Commonly known as:  CELEBREX  TAKE 1 CAPSULE EVERY 12 HOURS- resume 4 days post-op     cetirizine 10 MG tablet  Commonly known as:  ZYRTEC  Take 1 tablet (10 mg total) by mouth daily.     cholecalciferol 1000 UNITS tablet  Commonly known as:  VITAMIN D  Take 1,000 Units by mouth daily.     docusate sodium 100 MG capsule  Commonly known as:  COLACE  Take 1 capsule (100 mg total) by mouth 2 (two) times daily as needed for mild constipation.     Fluticasone-Salmeterol 250-50 MCG/DOSE Aepb  Commonly known as:  ADVAIR  Inhale 1 puff into the lungs every 12 (twelve) hours.     oxyCODONE-acetaminophen 5-325 MG per tablet  Commonly known as:  PERCOCET  Take 1 tablet by mouth every 4 (four) hours as needed.     pantoprazole 40 MG tablet  Commonly known as:  PROTONIX  Take 1 tablet (40 mg total) by mouth daily.     penicillin v potassium 500 MG tablet  Commonly known as:  VEETID  Take 500 mg by mouth 4 (four) times daily. For 7 days until complete     rosuvastatin 40 MG tablet  Commonly known as:  CRESTOR  TAKE 1 TABLET AT BEDTIME     telmisartan-hydrochlorothiazide 80-25 MG per tablet  Commonly known as:  MICARDIS HCT  TAKE 1 TABLET EVERY MORNING           Follow-up Information    Follow up with BEANE,JEFFREY C, MD In 2 weeks.   Specialty:  Orthopedic Surgery   Why:  For suture removal   Contact information:   [address] [place] [address] [phone]       Signed: Lacie Draft, PA-C for Dr. Tonita Cong Orthopaedic Surgery 12/09/2013, 10:34 AM

## 2013-12-09 NOTE — Plan of Care (Signed)
Problem: Consults Goal: Spinal Surgery Patient Education See Patient Education Module for education specifics.  Outcome: Completed/Met Date Met:  12/09/13 Goal: Diagnosis - Spinal Surgery Outcome: Completed/Met Date Met:  12/09/13 Lumbar Laminectomy (Complex) with Decompression Goal: Skin Care Protocol Initiated - if Braden Score 18 or less If consults are not indicated, leave blank or document N/A  Outcome: Not Applicable Date Met:  97/02/63 Goal: Nutrition Consult-if indicated Outcome: Not Applicable Date Met:  78/58/85 Goal: Diabetes Guidelines if Diabetic/Glucose > 140 If diabetic or lab glucose is > 140 mg/dl - Initiate Diabetes/Hyperglycemia Guidelines & Document Interventions  Outcome: Completed/Met Date Met:  12/09/13  Problem: Phase I Progression Outcomes Goal: OOB as tolerated unless otherwise ordered Outcome: Completed/Met Date Met:  12/09/13  Problem: Phase II Progression Outcomes Goal: Progress activity as tolerated unless otherwise ordered Outcome: Completed/Met Date Met:  12/09/13 Goal: Discharge plan established Outcome: Completed/Met Date Met:  12/09/13 Goal: Tolerating diet Outcome: Completed/Met Date Met:  12/09/13 Goal: Verbalizes of donning/doffing brace Outcome: Not Applicable Date Met:  02/77/41

## 2013-12-09 NOTE — Progress Notes (Signed)
Subjective: 1 Day Post-Op Procedure(s) (LRB): LUMBAR DECOMPRESSION L4-L5,  L3-L4  (N/A) Patient reports pain as mild.  Reports incisional pain, well controlled. Leg pain, tingling resolved. She has been OOB with PT without difficulty. Has been walking. Voiding without difficulty. Feels ready to go home. Seen by myself and Dr. Tonita Cong  Objective: Vital signs in last 24 hours: Temp:  [98.1 F (36.7 C)-98.6 F (37 C)] 98.4 F (36.9 C) (12/03 0601) Pulse Rate:  [90-115] 102 (12/03 0601) Resp:  [13-18] 16 (12/03 0601) BP: (101-116)/(37-70) 102/56 mmHg (12/03 0601) SpO2:  [93 %-100 %] 93 % (12/03 0806) Weight:  [94.348 kg (208 lb)] 94.348 kg (208 lb) (12/02 1430)  Intake/Output from previous day: 12/02 0701 - 12/03 0700 In: 3293 [P.O.:840; I.V.:2253; IV Piggyback:200] Out: 1448 [Urine:1550] Intake/Output this shift: Total I/O In: 240 [P.O.:240] Out: 150 [Urine:150]   Recent Labs  12/09/13 0506  HGB 11.8*    Recent Labs  12/09/13 0506  WBC 13.4*  RBC 3.92  HCT 35.9*  PLT 226    Recent Labs  12/09/13 0506  NA 138  K 4.0  CL 97  CO2 25  BUN 16  CREATININE 0.85  GLUCOSE 180*  CALCIUM 9.2   No results for input(s): LABPT, INR in the last 72 hours.  Neurologically intact ABD soft Neurovascular intact Sensation intact distally Intact pulses distally Dorsiflexion/Plantar flexion intact Incision: dressing C/D/I and no drainage No cellulitis present Compartment soft  No sign of DVT  Assessment/Plan: 1 Day Post-Op Procedure(s) (LRB): LUMBAR DECOMPRESSION L4-L5,  L3-L4  (N/A) Advance diet Up with therapy D/C IV fluids  Reviewed D/C instructions, Lspine precautions, dressing instructions D/C home today Follow up in office in 2 weeks for staple removal  BISSELL, JACLYN M. 12/09/2013, 10:29 AM

## 2013-12-09 NOTE — Plan of Care (Signed)
Problem: Phase III Progression Outcomes Goal: Activity at appropriate level-compared to baseline (UP IN CHAIR FOR HEMODIALYSIS)  Outcome: Adequate for Discharge Goal: Demonstrates proper use of assistive devices Outcome: Completed/Met Date Met:  12/09/13

## 2013-12-11 ENCOUNTER — Other Ambulatory Visit: Payer: Self-pay | Admitting: Nurse Practitioner

## 2013-12-12 ENCOUNTER — Other Ambulatory Visit: Payer: Self-pay | Admitting: Nurse Practitioner

## 2013-12-12 DIAGNOSIS — Z9889 Other specified postprocedural states: Secondary | ICD-10-CM | POA: Diagnosis not present

## 2013-12-12 DIAGNOSIS — E876 Hypokalemia: Secondary | ICD-10-CM | POA: Diagnosis not present

## 2013-12-12 DIAGNOSIS — R109 Unspecified abdominal pain: Secondary | ICD-10-CM | POA: Diagnosis not present

## 2013-12-12 DIAGNOSIS — F17200 Nicotine dependence, unspecified, uncomplicated: Secondary | ICD-10-CM | POA: Diagnosis not present

## 2013-12-12 DIAGNOSIS — K921 Melena: Secondary | ICD-10-CM | POA: Diagnosis not present

## 2013-12-12 DIAGNOSIS — R197 Diarrhea, unspecified: Secondary | ICD-10-CM | POA: Diagnosis not present

## 2013-12-12 DIAGNOSIS — M549 Dorsalgia, unspecified: Secondary | ICD-10-CM | POA: Diagnosis not present

## 2013-12-12 DIAGNOSIS — Z7951 Long term (current) use of inhaled steroids: Secondary | ICD-10-CM | POA: Diagnosis not present

## 2013-12-12 DIAGNOSIS — R112 Nausea with vomiting, unspecified: Secondary | ICD-10-CM | POA: Diagnosis not present

## 2013-12-12 DIAGNOSIS — Z79899 Other long term (current) drug therapy: Secondary | ICD-10-CM | POA: Diagnosis not present

## 2013-12-13 ENCOUNTER — Telehealth: Payer: Self-pay | Admitting: Nurse Practitioner

## 2013-12-13 NOTE — Telephone Encounter (Signed)
Pt given appt for tomorrow with Dietrich Pates at 12:00.

## 2013-12-14 ENCOUNTER — Ambulatory Visit (INDEPENDENT_AMBULATORY_CARE_PROVIDER_SITE_OTHER): Payer: Medicare Other | Admitting: Family Medicine

## 2013-12-14 ENCOUNTER — Encounter: Payer: Self-pay | Admitting: Family Medicine

## 2013-12-14 VITALS — BP 103/65 | HR 83 | Temp 98.1°F | Ht 64.0 in | Wt 202.0 lb

## 2013-12-14 DIAGNOSIS — A0472 Enterocolitis due to Clostridium difficile, not specified as recurrent: Secondary | ICD-10-CM

## 2013-12-14 DIAGNOSIS — E876 Hypokalemia: Secondary | ICD-10-CM | POA: Diagnosis not present

## 2013-12-14 DIAGNOSIS — A047 Enterocolitis due to Clostridium difficile: Secondary | ICD-10-CM

## 2013-12-14 NOTE — Progress Notes (Signed)
   Subjective:    Patient ID: Rita Payne, female    DOB: 1945/08/04, 68 y.o.   MRN: [id number]  HPI Patient is here for follow up from ED visit 12/12/13 for diarrhea, dehydration, and c-diff colitis.  She has been taking flagyl bid and KCL tablets due to low K.  She feels a lot better.  She had hypotension and her bp meds were held.  She is drinking po fluids and she is getting appetite. She is wanting to eat.  Review of Systems  Constitutional: Negative for fever.  HENT: Negative for ear pain.   Eyes: Negative for discharge.  Respiratory: Negative for cough.   Cardiovascular: Negative for chest pain.  Gastrointestinal: Negative for abdominal distention.  Endocrine: Negative for polyuria.  Genitourinary: Negative for difficulty urinating.  Musculoskeletal: Negative for gait problem and neck pain.  Skin: Negative for color change and rash.  Neurological: Negative for speech difficulty and headaches.  Psychiatric/Behavioral: Negative for agitation.       Objective:    BP 103/65 mmHg  Pulse 83  Temp(Src) 98.1 F (36.7 C) (Oral)  Ht $R'5\' 4"'qB$  (1.626 m)  Wt 202 lb (91.627 kg)  BMI 34.66 kg/m2  LMP 04/14/1982 Physical Exam  Constitutional: She is oriented to person, place, and time. She appears well-developed and well-nourished.  HENT:  Head: Normocephalic and atraumatic.  Mouth/Throat: Oropharynx is clear and moist.  Eyes: Pupils are equal, round, and reactive to light.  Neck: Normal range of motion. Neck supple.  Cardiovascular: Normal rate and regular rhythm.   No murmur heard. Pulmonary/Chest: Effort normal and breath sounds normal.  Abdominal: Soft. Bowel sounds are normal. There is no tenderness.  Neurological: She is alert and oriented to person, place, and time.  Skin: Skin is warm and dry.  Psychiatric: She has a normal mood and affect.          Assessment & Plan:     ICD-9-CM ICD-10-CM   1. Hypokalemia 276.8 E87.6 CMP14+EGFR  2. Enteritis due to Clostridium  difficile 008.45 A04.7    Continue KCL and flagyl and recommend she stay off bp meds and follow up in 2-3 days for labs and re check. She is advised that the c-diff may return after the round of flagyl so she will need to follow up prn if abdominal pain, diarrhea, or bloody stools returns.  Return if symptoms worsen or fail to improve.  Lysbeth Penner FNP

## 2013-12-17 ENCOUNTER — Encounter: Payer: Self-pay | Admitting: Family Medicine

## 2013-12-17 ENCOUNTER — Other Ambulatory Visit: Payer: Self-pay | Admitting: Family Medicine

## 2013-12-17 ENCOUNTER — Ambulatory Visit (INDEPENDENT_AMBULATORY_CARE_PROVIDER_SITE_OTHER): Payer: Medicare Other | Admitting: Family Medicine

## 2013-12-17 VITALS — BP 113/69 | HR 79 | Temp 97.5°F | Ht 64.0 in | Wt 202.8 lb

## 2013-12-17 DIAGNOSIS — R197 Diarrhea, unspecified: Secondary | ICD-10-CM | POA: Diagnosis not present

## 2013-12-17 DIAGNOSIS — B373 Candidiasis of vulva and vagina: Secondary | ICD-10-CM

## 2013-12-17 DIAGNOSIS — B3731 Acute candidiasis of vulva and vagina: Secondary | ICD-10-CM

## 2013-12-17 LAB — POCT CBC
Granulocyte percent: 66.7 %G (ref 37–80)
HCT, POC: 37.9 % (ref 37.7–47.9)
Hemoglobin: 12.9 g/dL (ref 12.2–16.2)
Lymph, poc: 2.5 (ref 0.6–3.4)
MCH, POC: 31.4 pg — AB (ref 27–31.2)
MCHC: 34.1 g/dL (ref 31.8–35.4)
MCV: 92 fL (ref 80–97)
MPV: 8.5 fL (ref 0–99.8)
POC Granulocyte: 6.9 (ref 2–6.9)
POC LYMPH PERCENT: 24.6 %L (ref 10–50)
Platelet Count, POC: 261 10*3/uL (ref 142–424)
RBC: 4.1 M/uL (ref 4.04–5.48)
RDW, POC: 14.3 %
WBC: 10.3 10*3/uL — AB (ref 4.6–10.2)

## 2013-12-17 MED ORDER — FLUCONAZOLE 150 MG PO TABS: 150.0000 mg | ORAL_TABLET | Freq: Once | ORAL | Status: DC

## 2013-12-17 NOTE — Progress Notes (Signed)
   Subjective:    Patient ID: Rita Payne, female    DOB: Feb 22, 1945, 68 y.o.   MRN: [id number]  HPI Patient is here for follow up on C-Diff colitis and she is taking flagyl.  She has had a bout of diarrhea this am and she is worried the c-diff is back.  She is having vaginal pruritis.  Review of Systems  Constitutional: Negative for fever.  HENT: Negative for ear pain.   Eyes: Negative for discharge.  Respiratory: Negative for cough.   Cardiovascular: Negative for chest pain.  Gastrointestinal: Negative for abdominal distention.  Endocrine: Negative for polyuria.  Genitourinary: Negative for difficulty urinating.  Musculoskeletal: Negative for gait problem and neck pain.  Skin: Negative for color change and rash.  Neurological: Negative for speech difficulty and headaches.  Psychiatric/Behavioral: Negative for agitation.       Objective:    BP 113/69 mmHg  Pulse 79  Temp(Src) 97.5 F (36.4 C) (Oral)  Ht $R'5\' 4"'mj$  (1.626 m)  Wt 202 lb 12.8 oz (91.989 kg)  BMI 34.79 kg/m2  LMP 04/14/1982 Physical Exam  Constitutional: She is oriented to person, place, and time. She appears well-developed and well-nourished.  HENT:  Head: Normocephalic and atraumatic.  Mouth/Throat: Oropharynx is clear and moist.  Eyes: Pupils are equal, round, and reactive to light.  Neck: Normal range of motion. Neck supple.  Cardiovascular: Normal rate and regular rhythm.   No murmur heard. Pulmonary/Chest: Effort normal and breath sounds normal.  Abdominal: Soft. Bowel sounds are normal. There is no tenderness.  Neurological: She is alert and oriented to person, place, and time.  Skin: Skin is warm and dry.  Psychiatric: She has a normal mood and affect.          Assessment & Plan:     ICD-9-CM ICD-10-CM   1. Diarrhea 787.91 R19.7 Clostridium difficile EIA     POCT CBC     BMP8+EGFR     fluconazole (DIFLUCAN) 150 MG tablet  2. Candidiasis of vagina 112.1 B37.3 Clostridium difficile EIA   POCT CBC     BMP8+EGFR     fluconazole (DIFLUCAN) 150 MG tablet     No Follow-up on file.  Lysbeth Penner FNP

## 2013-12-18 LAB — BMP8+EGFR
BUN/Creatinine Ratio: 12 (ref 11–26)
BUN: 10 mg/dL (ref 8–27)
CO2: 23 mmol/L (ref 18–29)
Calcium: 9.2 mg/dL (ref 8.7–10.3)
Chloride: 98 mmol/L (ref 97–108)
Creatinine, Ser: 0.81 mg/dL (ref 0.57–1.00)
GFR calc Af Amer: 88 mL/min/{1.73_m2} (ref >59)
GFR calc non Af Amer: 76 mL/min/{1.73_m2} (ref >59)
Glucose: 110 mg/dL — ABNORMAL HIGH (ref 65–99)
Potassium: 5.2 mmol/L (ref 3.5–5.2)
Sodium: 137 mmol/L (ref 134–144)

## 2013-12-20 ENCOUNTER — Telehealth: Payer: Self-pay | Admitting: *Deleted

## 2013-12-20 LAB — CLOSTRIDIUM DIFFICILE EIA: C difficile Toxins A+B, EIA: NEGATIVE

## 2013-12-20 NOTE — Telephone Encounter (Signed)
-----   Message from Lysbeth Penner, FNP sent at 12/20/2013 10:24 AM EST ----- Labs look good

## 2013-12-20 NOTE — Telephone Encounter (Signed)
Labs normal.

## 2014-01-03 ENCOUNTER — Other Ambulatory Visit: Payer: Self-pay | Admitting: *Deleted

## 2014-01-03 DIAGNOSIS — K219 Gastro-esophageal reflux disease without esophagitis: Secondary | ICD-10-CM

## 2014-01-03 MED ORDER — PANTOPRAZOLE SODIUM 40 MG PO TBEC: 40.0000 mg | DELAYED_RELEASE_TABLET | Freq: Every day | ORAL | Status: DC

## 2014-01-05 ENCOUNTER — Other Ambulatory Visit: Payer: Self-pay | Admitting: Nurse Practitioner

## 2014-01-17 DIAGNOSIS — Z4789 Encounter for other orthopedic aftercare: Secondary | ICD-10-CM | POA: Diagnosis not present

## 2014-01-24 DIAGNOSIS — R262 Difficulty in walking, not elsewhere classified: Secondary | ICD-10-CM | POA: Diagnosis not present

## 2014-01-24 DIAGNOSIS — M545 Low back pain: Secondary | ICD-10-CM | POA: Diagnosis not present

## 2014-01-24 DIAGNOSIS — M25551 Pain in right hip: Secondary | ICD-10-CM | POA: Diagnosis not present

## 2014-01-26 DIAGNOSIS — M545 Low back pain: Secondary | ICD-10-CM | POA: Diagnosis not present

## 2014-01-26 DIAGNOSIS — R262 Difficulty in walking, not elsewhere classified: Secondary | ICD-10-CM | POA: Diagnosis not present

## 2014-01-26 DIAGNOSIS — M25551 Pain in right hip: Secondary | ICD-10-CM | POA: Diagnosis not present

## 2014-02-02 DIAGNOSIS — M25551 Pain in right hip: Secondary | ICD-10-CM | POA: Diagnosis not present

## 2014-02-02 DIAGNOSIS — M545 Low back pain: Secondary | ICD-10-CM | POA: Diagnosis not present

## 2014-02-02 DIAGNOSIS — R262 Difficulty in walking, not elsewhere classified: Secondary | ICD-10-CM | POA: Diagnosis not present

## 2014-02-04 DIAGNOSIS — M545 Low back pain: Secondary | ICD-10-CM | POA: Diagnosis not present

## 2014-02-04 DIAGNOSIS — M25551 Pain in right hip: Secondary | ICD-10-CM | POA: Diagnosis not present

## 2014-02-04 DIAGNOSIS — R262 Difficulty in walking, not elsewhere classified: Secondary | ICD-10-CM | POA: Diagnosis not present

## 2014-02-08 DIAGNOSIS — R262 Difficulty in walking, not elsewhere classified: Secondary | ICD-10-CM | POA: Diagnosis not present

## 2014-02-08 DIAGNOSIS — M545 Low back pain: Secondary | ICD-10-CM | POA: Diagnosis not present

## 2014-02-08 DIAGNOSIS — Z9889 Other specified postprocedural states: Secondary | ICD-10-CM | POA: Diagnosis not present

## 2014-02-08 DIAGNOSIS — M25551 Pain in right hip: Secondary | ICD-10-CM | POA: Diagnosis not present

## 2014-02-09 DIAGNOSIS — M25551 Pain in right hip: Secondary | ICD-10-CM | POA: Diagnosis not present

## 2014-02-09 DIAGNOSIS — Z9889 Other specified postprocedural states: Secondary | ICD-10-CM | POA: Diagnosis not present

## 2014-02-09 DIAGNOSIS — R262 Difficulty in walking, not elsewhere classified: Secondary | ICD-10-CM | POA: Diagnosis not present

## 2014-02-09 DIAGNOSIS — M545 Low back pain: Secondary | ICD-10-CM | POA: Diagnosis not present

## 2014-02-14 DIAGNOSIS — M545 Low back pain: Secondary | ICD-10-CM | POA: Diagnosis not present

## 2014-02-14 DIAGNOSIS — M25551 Pain in right hip: Secondary | ICD-10-CM | POA: Diagnosis not present

## 2014-02-14 DIAGNOSIS — R262 Difficulty in walking, not elsewhere classified: Secondary | ICD-10-CM | POA: Diagnosis not present

## 2014-02-14 DIAGNOSIS — Z9889 Other specified postprocedural states: Secondary | ICD-10-CM | POA: Diagnosis not present

## 2014-02-16 DIAGNOSIS — Z9889 Other specified postprocedural states: Secondary | ICD-10-CM | POA: Diagnosis not present

## 2014-02-16 DIAGNOSIS — M545 Low back pain: Secondary | ICD-10-CM | POA: Diagnosis not present

## 2014-02-16 DIAGNOSIS — M25551 Pain in right hip: Secondary | ICD-10-CM | POA: Diagnosis not present

## 2014-02-16 DIAGNOSIS — R262 Difficulty in walking, not elsewhere classified: Secondary | ICD-10-CM | POA: Diagnosis not present

## 2014-02-17 ENCOUNTER — Encounter: Payer: Self-pay | Admitting: Nurse Practitioner

## 2014-02-17 ENCOUNTER — Ambulatory Visit (INDEPENDENT_AMBULATORY_CARE_PROVIDER_SITE_OTHER): Payer: Medicare Other | Admitting: Nurse Practitioner

## 2014-02-17 VITALS — BP 128/82 | HR 89 | Temp 96.8°F | Ht 64.0 in | Wt 206.0 lb

## 2014-02-17 DIAGNOSIS — Z6833 Body mass index (BMI) 33.0-33.9, adult: Secondary | ICD-10-CM | POA: Insufficient documentation

## 2014-02-17 DIAGNOSIS — I10 Essential (primary) hypertension: Secondary | ICD-10-CM | POA: Diagnosis not present

## 2014-02-17 DIAGNOSIS — E119 Type 2 diabetes mellitus without complications: Secondary | ICD-10-CM | POA: Diagnosis not present

## 2014-02-17 DIAGNOSIS — Z720 Tobacco use: Secondary | ICD-10-CM | POA: Diagnosis not present

## 2014-02-17 DIAGNOSIS — Z6835 Body mass index (BMI) 35.0-35.9, adult: Secondary | ICD-10-CM

## 2014-02-17 DIAGNOSIS — E785 Hyperlipidemia, unspecified: Secondary | ICD-10-CM

## 2014-02-17 DIAGNOSIS — J4520 Mild intermittent asthma, uncomplicated: Secondary | ICD-10-CM | POA: Diagnosis not present

## 2014-02-17 DIAGNOSIS — F17200 Nicotine dependence, unspecified, uncomplicated: Secondary | ICD-10-CM

## 2014-02-17 LAB — POCT GLYCOSYLATED HEMOGLOBIN (HGB A1C): Hemoglobin A1C: 7

## 2014-02-17 NOTE — Progress Notes (Addendum)
   Subjective:    Patient ID: Rita Payne, female    DOB: Nov 14, 1945, 69 y.o.   MRN: [id number]    HPI  Patient in today for follow up of chronic medical problems:  Hypertension Currently on micardis/hctz - working well no c/o side effects- not watching diet or exercising Hyperlipidemia crestor QOD- no muscle aches- not watching diet GERD Protonix working better than zantac was- symptoms under good control Diabetes Patient has been under a lot of stress and says that blood sugar is running a little high when she checks it           ( 130-140 ). Not watching diet Asthma Uses advair daily- only uses albuterol maybe 2x a month. Patient does smoke about 6 cigarettes a day.     Review of Systems  Constitutional: Negative.   HENT: Negative.   Respiratory: Negative.   Cardiovascular: Negative.   Genitourinary: Negative.   Neurological: Negative.   Psychiatric/Behavioral: Negative.   All other systems reviewed and are negative.      Objective:   Physical Exam  Constitutional: She is oriented to person, place, and time. She appears well-developed and well-nourished.  HENT:  Nose: Nose normal.  Mouth/Throat: Oropharynx is clear and moist.  Eyes: EOM are normal.  Neck: Trachea normal, normal range of motion and full passive range of motion without pain. Neck supple. No JVD present. Carotid bruit is not present. No thyromegaly present.  Cardiovascular: Normal rate, regular rhythm, normal heart sounds and intact distal pulses.  Exam reveals no gallop and no friction rub.   No murmur heard. Pulmonary/Chest: Effort normal. She has wheezes (faint exp wheezes in bil bases).  Abdominal: Soft. Bowel sounds are normal. She exhibits no distension and no mass. There is no tenderness.  Musculoskeletal: Normal range of motion.  Lymphadenopathy:    She has no cervical adenopathy.  Neurological: She is alert and oriented to person, place, and time. She has normal reflexes.  Skin: Skin is warm  and dry.  Psychiatric: She has a normal mood and affect. Her behavior is normal. Judgment and thought content normal.   Results for orders placed or performed in visit on 02/17/14  POCT glycosylated hemoglobin (Hb A1C)  Result Value Ref Range   Hemoglobin A1C 7.0%    BP 128/82 mmHg  Pulse 89  Temp(Src) 96.8 F (36 C) (Oral)  Ht $R'5\' 4"'Bw$  (1.626 m)  Wt 206 lb (93.441 kg)  BMI 35.34 kg/m2  LMP 04/14/1982         Assessment & Plan:  1. Essential hypertension Do not add salt to diet - CMP14+EGFR  2. Hyperlipidemia with target LDL less than 100 Low fat diet - NMR, lipoprofile  3. Type 2 diabetes mellitus without complication continue carb counting - POCT glycosylated hemoglobin (Hb A1C)  4. Smoker Smoking cessation encouraged  5. BMI 35.0-35.9,adult Discussed diet and exercise for person with BMI >25 Will recheck weight in 3-6 months   6. Asthma, chronic, mild intermittent, uncomplicated Continue inhalers   Patient will make appointment for mammogram Labs pending Health maintenance reviewed Diet and exercise encouraged Continue all meds Follow up  In 3 months   [place], FNP

## 2014-02-17 NOTE — Patient Instructions (Signed)
Diabetes and Foot Care Diabetes may cause you to have problems because of poor blood supply (circulation) to your feet and legs. This may cause the skin on your feet to become thinner, break easier, and heal more slowly. Your skin may become dry, and the skin may peel and crack. You may also have nerve damage in your legs and feet causing decreased feeling in them. You may not notice minor injuries to your feet that could lead to infections or more serious problems. Taking care of your feet is one of the most important things you can do for yourself.  HOME CARE INSTRUCTIONS  Wear shoes at all times, even in the house. Do not go barefoot. Bare feet are easily injured.  Check your feet daily for blisters, cuts, and redness. If you cannot see the bottom of your feet, use a mirror or ask someone for help.  Wash your feet with warm water (do not use hot water) and mild soap. Then pat your feet and the areas between your toes until they are completely dry. Do not soak your feet as this can dry your skin.  Apply a moisturizing lotion or petroleum jelly (that does not contain alcohol and is unscented) to the skin on your feet and to dry, brittle toenails. Do not apply lotion between your toes.  Trim your toenails straight across. Do not dig under them or around the cuticle. File the edges of your nails with an emery board or nail file.  Do not cut corns or calluses or try to remove them with medicine.  Wear clean socks or stockings every day. Make sure they are not too tight. Do not wear knee-high stockings since they may decrease blood flow to your legs.  Wear shoes that fit properly and have enough cushioning. To break in new shoes, wear them for just a few hours a day. This prevents you from injuring your feet. Always look in your shoes before you put them on to be sure there are no objects inside.  Do not cross your legs. This may decrease the blood flow to your feet.  If you find a minor scrape,  cut, or break in the skin on your feet, keep it and the skin around it clean and dry. These areas may be cleansed with mild soap and water. Do not cleanse the area with peroxide, alcohol, or iodine.  When you remove an adhesive bandage, be sure not to damage the skin around it.  If you have a wound, look at it several times a day to make sure it is healing.  Do not use heating pads or hot water bottles. They may burn your skin. If you have lost feeling in your feet or legs, you may not know it is happening until it is too late.  Make sure your health care provider performs a complete foot exam at least annually or more often if you have foot problems. Report any cuts, sores, or bruises to your health care provider immediately. SEEK MEDICAL CARE IF:   You have an injury that is not healing.  You have cuts or breaks in the skin.  You have an ingrown nail.  You notice redness on your legs or feet.  You feel burning or tingling in your legs or feet.  You have pain or cramps in your legs and feet.  Your legs or feet are numb.  Your feet always feel cold. SEEK IMMEDIATE MEDICAL CARE IF:   There is increasing redness,   swelling, or pain in or around a wound.  There is a red line that goes up your leg.  Pus is coming from a wound.  You develop a fever or as directed by your health care provider.  You notice a bad smell coming from an ulcer or wound. Document Released: 12/22/1999 Document Revised: 08/26/2012 Document Reviewed: 06/02/2012 Exit[employer] Patient Information 2015 [employer]. This information is not intended to replace advice given to you by your health care provider. Make sure you discuss any questions you have with your health care provider.  

## 2014-02-23 DIAGNOSIS — R262 Difficulty in walking, not elsewhere classified: Secondary | ICD-10-CM | POA: Diagnosis not present

## 2014-02-23 DIAGNOSIS — Z9889 Other specified postprocedural states: Secondary | ICD-10-CM | POA: Diagnosis not present

## 2014-02-23 DIAGNOSIS — M25551 Pain in right hip: Secondary | ICD-10-CM | POA: Diagnosis not present

## 2014-02-23 DIAGNOSIS — M545 Low back pain: Secondary | ICD-10-CM | POA: Diagnosis not present

## 2014-02-23 LAB — CMP14+EGFR
ALBUMIN: 4.3 g/dL (ref 3.6–4.8)
ALT: 79 IU/L — AB (ref 0–32)
AST: 78 IU/L — AB (ref 0–40)
Albumin/Globulin Ratio: 1.6 (ref 1.1–2.5)
Alkaline Phosphatase: 82 IU/L (ref 39–117)
BILIRUBIN TOTAL: 0.7 mg/dL (ref 0.0–1.2)
BUN/Creatinine Ratio: 19 (ref 11–26)
BUN: 14 mg/dL (ref 8–27)
CO2: 27 mmol/L (ref 18–29)
Calcium: 9.9 mg/dL (ref 8.7–10.3)
Chloride: 95 mmol/L — ABNORMAL LOW (ref 97–108)
Creatinine, Ser: 0.73 mg/dL (ref 0.57–1.00)
GFR calc non Af Amer: 86 mL/min/{1.73_m2} (ref >59)
GFR, EST AFRICAN AMERICAN: 99 mL/min/{1.73_m2} (ref >59)
GLUCOSE: 155 mg/dL — AB (ref 65–99)
Globulin, Total: 2.7 g/dL (ref 1.5–4.5)
Potassium: 4.3 mmol/L (ref 3.5–5.2)
Sodium: 138 mmol/L (ref 134–144)
Total Protein: 7 g/dL (ref 6.0–8.5)

## 2014-02-25 DIAGNOSIS — M25551 Pain in right hip: Secondary | ICD-10-CM | POA: Diagnosis not present

## 2014-02-25 DIAGNOSIS — M545 Low back pain: Secondary | ICD-10-CM | POA: Diagnosis not present

## 2014-02-25 DIAGNOSIS — R262 Difficulty in walking, not elsewhere classified: Secondary | ICD-10-CM | POA: Diagnosis not present

## 2014-02-25 DIAGNOSIS — Z9889 Other specified postprocedural states: Secondary | ICD-10-CM | POA: Diagnosis not present

## 2014-02-28 DIAGNOSIS — Z4789 Encounter for other orthopedic aftercare: Secondary | ICD-10-CM | POA: Diagnosis not present

## 2014-03-01 DIAGNOSIS — Z9889 Other specified postprocedural states: Secondary | ICD-10-CM | POA: Diagnosis not present

## 2014-03-01 DIAGNOSIS — M545 Low back pain: Secondary | ICD-10-CM | POA: Diagnosis not present

## 2014-03-01 DIAGNOSIS — R262 Difficulty in walking, not elsewhere classified: Secondary | ICD-10-CM | POA: Diagnosis not present

## 2014-03-01 DIAGNOSIS — M25551 Pain in right hip: Secondary | ICD-10-CM | POA: Diagnosis not present

## 2014-03-03 DIAGNOSIS — R262 Difficulty in walking, not elsewhere classified: Secondary | ICD-10-CM | POA: Diagnosis not present

## 2014-03-03 DIAGNOSIS — M545 Low back pain: Secondary | ICD-10-CM | POA: Diagnosis not present

## 2014-03-03 DIAGNOSIS — Z9889 Other specified postprocedural states: Secondary | ICD-10-CM | POA: Diagnosis not present

## 2014-03-03 DIAGNOSIS — M25551 Pain in right hip: Secondary | ICD-10-CM | POA: Diagnosis not present

## 2014-03-04 DIAGNOSIS — Z1231 Encounter for screening mammogram for malignant neoplasm of breast: Secondary | ICD-10-CM | POA: Diagnosis not present

## 2014-03-08 ENCOUNTER — Other Ambulatory Visit: Payer: Self-pay | Admitting: Nurse Practitioner

## 2014-03-08 LAB — HM MAMMOGRAPHY

## 2014-03-10 DIAGNOSIS — R262 Difficulty in walking, not elsewhere classified: Secondary | ICD-10-CM | POA: Diagnosis not present

## 2014-03-10 DIAGNOSIS — M545 Low back pain: Secondary | ICD-10-CM | POA: Diagnosis not present

## 2014-03-10 DIAGNOSIS — M25551 Pain in right hip: Secondary | ICD-10-CM | POA: Diagnosis not present

## 2014-03-11 DIAGNOSIS — R262 Difficulty in walking, not elsewhere classified: Secondary | ICD-10-CM | POA: Diagnosis not present

## 2014-03-11 DIAGNOSIS — M545 Low back pain: Secondary | ICD-10-CM | POA: Diagnosis not present

## 2014-03-11 DIAGNOSIS — M25551 Pain in right hip: Secondary | ICD-10-CM | POA: Diagnosis not present

## 2014-03-12 ENCOUNTER — Ambulatory Visit (INDEPENDENT_AMBULATORY_CARE_PROVIDER_SITE_OTHER): Payer: Medicare Other | Admitting: Family

## 2014-03-12 VITALS — BP 141/92 | HR 105 | Temp 97.3°F | Ht 64.0 in | Wt 207.0 lb

## 2014-03-12 DIAGNOSIS — R300 Dysuria: Secondary | ICD-10-CM | POA: Diagnosis not present

## 2014-03-12 DIAGNOSIS — R319 Hematuria, unspecified: Secondary | ICD-10-CM | POA: Diagnosis not present

## 2014-03-12 DIAGNOSIS — N390 Urinary tract infection, site not specified: Secondary | ICD-10-CM | POA: Diagnosis not present

## 2014-03-12 LAB — POCT UA - MICROSCOPIC ONLY
CASTS, UR, LPF, POC: NEGATIVE
Crystals, Ur, HPF, POC: NEGATIVE
Mucus, UA: NEGATIVE

## 2014-03-12 LAB — POCT URINALYSIS DIPSTICK
Bilirubin, UA: NEGATIVE
Glucose, UA: NEGATIVE
KETONES UA: NEGATIVE
NITRITE UA: NEGATIVE
PH UA: 6.5
PROTEIN UA: NEGATIVE
Spec Grav, UA: 1.025
UROBILINOGEN UA: NEGATIVE

## 2014-03-12 MED ORDER — SULFAMETHOXAZOLE-TRIMETHOPRIM 800-160 MG PO TABS: 1.0000 | ORAL_TABLET | Freq: Two times a day (BID) | ORAL | Status: DC

## 2014-03-12 NOTE — Patient Instructions (Signed)

## 2014-03-12 NOTE — Progress Notes (Signed)
   Subjective:    Patient ID: Rita Payne, female    DOB: 07-Feb-1945, 69 y.o.   MRN: [id number]  Dysuria  This is a new problem. The current episode started yesterday. The problem occurs every urination. The problem has been gradually worsening. The quality of the pain is described as burning. The pain is at a severity of 3/10. The pain is mild. There has been no fever. Associated symptoms include chills, frequency and urgency. Pertinent negatives include no discharge, flank pain, hematuria, nausea or vomiting. She has tried acetaminophen for the symptoms. The treatment provided mild relief.      Review of Systems  Constitutional: Positive for chills.  HENT: Negative.   Eyes: Negative.   Respiratory: Negative.  Negative for shortness of breath.   Cardiovascular: Negative.  Negative for palpitations.  Gastrointestinal: Negative.  Negative for nausea and vomiting.  Endocrine: Negative.   Genitourinary: Positive for dysuria, urgency and frequency. Negative for hematuria and flank pain.  Musculoskeletal: Negative.   Neurological: Negative.  Negative for headaches.  Hematological: Negative.   Psychiatric/Behavioral: Negative.   All other systems reviewed and are negative.      Objective:   Physical Exam  Constitutional: She is oriented to person, place, and time. She appears well-developed and well-nourished. No distress.  Eyes: Pupils are equal, round, and reactive to light.  Neck: Normal range of motion. Neck supple. No thyromegaly present.  Cardiovascular: Normal rate, regular rhythm, normal heart sounds and intact distal pulses.   No murmur heard. Pulmonary/Chest: Effort normal and breath sounds normal. No respiratory distress. She has no wheezes.  Abdominal: Soft. Bowel sounds are normal. She exhibits no distension. There is no tenderness.  Musculoskeletal: Normal range of motion. She exhibits no edema or tenderness.  Neg CVA tenderness   Neurological: She is alert and oriented  to person, place, and time. She has normal reflexes. No cranial nerve deficit.  Skin: Skin is warm and dry.  Psychiatric: She has a normal mood and affect. Her behavior is normal. Judgment and thought content normal.  Vitals reviewed.     BP 141/92 mmHg  Pulse 105  Temp(Src) 97.3 F (36.3 C) (Oral)  Ht 5\' 4"  (1.626 m)  Wt 207 lb (93.895 kg)  BMI 35.51 kg/m2  LMP 04/14/1982     Assessment & Plan:  1. Dysuria - POCT urinalysis dipstick - POCT UA - Microscopic Only  2. Urinary tract infection with hematuria, site unspecified -Force fluids AZO over the counter X2 days RTO prn Culture pending - sulfamethoxazole-trimethoprim (BACTRIM DS,SEPTRA DS) 800-160 MG per tablet; Take 1 tablet by mouth 2 (two) times daily.  Dispense: 10 tablet; Refill: 0  Evelina Dun, FNP

## 2014-03-16 ENCOUNTER — Other Ambulatory Visit: Payer: Self-pay | Admitting: Family Medicine

## 2014-03-22 DIAGNOSIS — M545 Low back pain: Secondary | ICD-10-CM | POA: Diagnosis not present

## 2014-03-22 DIAGNOSIS — M25551 Pain in right hip: Secondary | ICD-10-CM | POA: Diagnosis not present

## 2014-03-22 DIAGNOSIS — R262 Difficulty in walking, not elsewhere classified: Secondary | ICD-10-CM | POA: Diagnosis not present

## 2014-04-08 ENCOUNTER — Ambulatory Visit (INDEPENDENT_AMBULATORY_CARE_PROVIDER_SITE_OTHER): Payer: Medicare Other | Admitting: Physician Assistant

## 2014-04-08 ENCOUNTER — Encounter: Payer: Self-pay | Admitting: Physician Assistant

## 2014-04-08 VITALS — BP 124/73 | HR 93 | Temp 97.3°F | Ht 64.0 in | Wt 208.0 lb

## 2014-04-08 DIAGNOSIS — R399 Unspecified symptoms and signs involving the genitourinary system: Secondary | ICD-10-CM | POA: Diagnosis not present

## 2014-04-08 DIAGNOSIS — N3090 Cystitis, unspecified without hematuria: Secondary | ICD-10-CM | POA: Diagnosis not present

## 2014-04-08 DIAGNOSIS — R3989 Other symptoms and signs involving the genitourinary system: Secondary | ICD-10-CM | POA: Diagnosis not present

## 2014-04-08 LAB — POCT UA - MICROSCOPIC ONLY
CRYSTALS, UR, HPF, POC: NEGATIVE
Casts, Ur, LPF, POC: NEGATIVE
Mucus, UA: NEGATIVE
RBC, urine, microscopic: NEGATIVE
Yeast, UA: NEGATIVE

## 2014-04-08 LAB — POCT URINALYSIS DIPSTICK
Bilirubin, UA: NEGATIVE
Glucose, UA: 500
Ketones, UA: NEGATIVE
Nitrite, UA: POSITIVE
PH UA: 6.5
Protein, UA: NEGATIVE
Spec Grav, UA: 1.015
UROBILINOGEN UA: NEGATIVE

## 2014-04-08 MED ORDER — CIPROFLOXACIN HCL 500 MG PO TABS: 500.0000 mg | ORAL_TABLET | Freq: Two times a day (BID) | ORAL | Status: DC

## 2014-04-08 NOTE — Patient Instructions (Signed)
-   Take entire course of antibiotic as directed.  - Drink lots of non caffeined beverages - May take OTC Azo for pain relief during urination - If severe back pain or fever develops, follow up in clinic or report to the ED for further evaluation.   Urinary Tract Infection A urinary tract infection (UTI) can occur any place along the urinary tract. The tract includes the kidneys, ureters, bladder, and urethra. A type of germ called bacteria often causes a UTI. UTIs are often helped with antibiotic medicine.  HOME CARE   If given, take antibiotics as told by your doctor. Finish them even if you start to feel better.  Drink enough fluids to keep your pee (urine) clear or pale yellow.  Avoid tea, drinks with caffeine, and bubbly (carbonated) drinks.  Pee often. Avoid holding your pee in for a long time.  Pee before and after having sex (intercourse).  Wipe from front to back after you poop (bowel movement) if you are a woman. Use each tissue only once. GET HELP RIGHT AWAY IF:   You have back pain.  You have lower belly (abdominal) pain.  You have chills.  You feel sick to your stomach (nauseous).  You throw up (vomit).  Your burning or discomfort with peeing does not go away.  You have a fever.  Your symptoms are not better in 3 days. MAKE SURE YOU:   Understand these instructions.  Will watch your condition.  Will get help right away if you are not doing well or get worse. Document Released: 06/12/2007 Document Revised: 09/18/2011 Document Reviewed: 07/25/2011 [facility] Patient Information 2015 [place], [place]. This information is not intended to replace advice given to you by your health care provider. Make sure you discuss any questions you have with your health care provider.

## 2014-04-08 NOTE — Progress Notes (Signed)
   Subjective:    Patient ID: Rita Payne, female    DOB: 08-06-1945, 69 y.o.   MRN: [id number]  HPI 69 y/o female presents with c/o pain with urination, urgency, frequency, Burning x 1 days. WAs treated on 03/12/14 for cystitis with relief but symptoms recurred.    Review of Systems  Constitutional: Positive for chills and diaphoresis.  Genitourinary: Positive for dysuria, urgency, frequency and decreased urine volume. Negative for hematuria and flank pain.  Musculoskeletal: Negative for back pain.       Objective:   Physical Exam  Constitutional: She appears well-developed and well-nourished.  obese  Abdominal: Soft. She exhibits no distension and no mass. There is no tenderness. There is no rebound and no guarding.  Mild abdominal pressure and palpation caused mild urination    Neurological: She is alert. She has normal reflexes.  Nursing note and vitals reviewed.         Assessment & Plan:  1. Cystitis: Cipro 500mg  BID x 7 day. Urine culture ordered Will change abx if needed. F/U in 2 weeks for recheck. If still symptomatic will refer to Urologist for further eval d/t frequency.   Azo for pain . Drink plenty of fluids.

## 2014-04-10 LAB — URINE CULTURE

## 2014-04-14 ENCOUNTER — Telehealth: Payer: Self-pay | Admitting: *Deleted

## 2014-04-14 NOTE — Telephone Encounter (Signed)
-----   Message from Adella Nissen, PA-C sent at 04/14/2014 10:25 AM EDT ----- Ok. Patient was given Cipro, bacteria is sensitive

## 2014-04-14 NOTE — Telephone Encounter (Signed)
Pt notified of results Verbalizes understanding 

## 2014-04-22 ENCOUNTER — Encounter: Payer: Self-pay | Admitting: Physician Assistant

## 2014-04-22 ENCOUNTER — Ambulatory Visit (INDEPENDENT_AMBULATORY_CARE_PROVIDER_SITE_OTHER): Payer: Medicare Other | Admitting: Physician Assistant

## 2014-04-22 VITALS — BP 131/75 | HR 103 | Temp 97.2°F | Ht 64.0 in | Wt 206.0 lb

## 2014-04-22 DIAGNOSIS — B3731 Acute candidiasis of vulva and vagina: Secondary | ICD-10-CM

## 2014-04-22 DIAGNOSIS — N390 Urinary tract infection, site not specified: Secondary | ICD-10-CM

## 2014-04-22 DIAGNOSIS — A499 Bacterial infection, unspecified: Secondary | ICD-10-CM | POA: Diagnosis not present

## 2014-04-22 DIAGNOSIS — B373 Candidiasis of vulva and vagina: Secondary | ICD-10-CM

## 2014-04-22 LAB — POCT UA - MICROSCOPIC ONLY
CRYSTALS, UR, HPF, POC: NEGATIVE
Casts, Ur, LPF, POC: NEGATIVE
RBC, urine, microscopic: NEGATIVE
Yeast, UA: NEGATIVE

## 2014-04-22 LAB — POCT URINALYSIS DIPSTICK
Ketones, UA: NEGATIVE
Leukocytes, UA: NEGATIVE
Nitrite, UA: NEGATIVE
PH UA: 6
RBC UA: NEGATIVE
Spec Grav, UA: 1.025
Urobilinogen, UA: NEGATIVE

## 2014-04-22 MED ORDER — FLUCONAZOLE 150 MG PO TABS
ORAL_TABLET | ORAL | Status: DC
Start: 2014-04-22 — End: 2014-06-01

## 2014-04-22 NOTE — Progress Notes (Signed)
   Subjective:    Patient ID: Rita Payne, female    DOB: 10/24/1945, 69 y.o.   MRN: [id number]  HPI 69 Y/O female presents for 2 week f/u of cystitis. She states that she is asymptomatic and feels better, however thinks she has a yeast infection.     Review of Systems  Gastrointestinal: Negative.   Genitourinary: Positive for vaginal discharge (white, cottage cheese like ). Negative for dysuria, urgency, frequency, hematuria, flank pain, decreased urine volume, vaginal bleeding, enuresis, difficulty urinating and dyspareunia.       Vaginal itch        Objective:   Physical Exam  Constitutional: She is oriented to person, place, and time.  Genitourinary:  U/a negative for infection, leukocytes  Neurological: She is alert and oriented to person, place, and time. She has normal reflexes.  Psychiatric: She has a normal mood and affect. Her behavior is normal. Judgment and thought content normal.  Nursing note and vitals reviewed.         Assessment & Plan:   1. UTI (urinary tract infection), bacterial  - POCT UA - Microscopic Only - POCT urinalysis dipstick  2. Vulvovaginal candidiasis  - fluconazole (DIFLUCAN) 150 MG tablet; Take 1 tablet on day 1. Repeat in 3 days if still having symptoms.  Dispense: 2 tablet; Refill: 0   Continue all meds

## 2014-06-01 ENCOUNTER — Ambulatory Visit (INDEPENDENT_AMBULATORY_CARE_PROVIDER_SITE_OTHER): Payer: Medicare Other | Admitting: Nurse Practitioner

## 2014-06-01 ENCOUNTER — Encounter: Payer: Self-pay | Admitting: Nurse Practitioner

## 2014-06-01 VITALS — BP 134/75 | HR 108 | Temp 96.8°F | Ht 64.0 in | Wt 204.0 lb

## 2014-06-01 DIAGNOSIS — E119 Type 2 diabetes mellitus without complications: Secondary | ICD-10-CM

## 2014-06-01 DIAGNOSIS — E785 Hyperlipidemia, unspecified: Secondary | ICD-10-CM

## 2014-06-01 DIAGNOSIS — K219 Gastro-esophageal reflux disease without esophagitis: Secondary | ICD-10-CM

## 2014-06-01 DIAGNOSIS — L989 Disorder of the skin and subcutaneous tissue, unspecified: Secondary | ICD-10-CM | POA: Diagnosis not present

## 2014-06-01 DIAGNOSIS — Z6835 Body mass index (BMI) 35.0-35.9, adult: Secondary | ICD-10-CM

## 2014-06-01 DIAGNOSIS — F17200 Nicotine dependence, unspecified, uncomplicated: Secondary | ICD-10-CM

## 2014-06-01 DIAGNOSIS — Z720 Tobacco use: Secondary | ICD-10-CM

## 2014-06-01 DIAGNOSIS — I10 Essential (primary) hypertension: Secondary | ICD-10-CM

## 2014-06-01 DIAGNOSIS — Z23 Encounter for immunization: Secondary | ICD-10-CM

## 2014-06-01 DIAGNOSIS — J4520 Mild intermittent asthma, uncomplicated: Secondary | ICD-10-CM

## 2014-06-01 LAB — POCT GLYCOSYLATED HEMOGLOBIN (HGB A1C): HEMOGLOBIN A1C: 9.2

## 2014-06-01 MED ORDER — PANTOPRAZOLE SODIUM 40 MG PO TBEC: 40.0000 mg | DELAYED_RELEASE_TABLET | Freq: Every day | ORAL | Status: DC

## 2014-06-01 MED ORDER — ROSUVASTATIN CALCIUM 40 MG PO TABS: ORAL_TABLET | ORAL | Status: DC

## 2014-06-01 MED ORDER — METFORMIN HCL 500 MG PO TABS: 500.0000 mg | ORAL_TABLET | Freq: Two times a day (BID) | ORAL | Status: DC

## 2014-06-01 MED ORDER — FLUTICASONE-SALMETEROL 250-50 MCG/DOSE IN AEPB
1.0000 | INHALATION_SPRAY | Freq: Two times a day (BID) | RESPIRATORY_TRACT | Status: DC
Start: 2014-06-01 — End: 2014-12-23

## 2014-06-01 NOTE — Patient Instructions (Signed)

## 2014-06-01 NOTE — Progress Notes (Signed)
Subjective:    Patient ID: Rita Payne, female    DOB: Apr 06, 1945, 69 y.o.   MRN: [id number]   Patient here today for follow up of chronic medical problems. Joined gym and has been exercising 3 days a week. Wants referral to dermatology for skin lesions   Hypertension This is a chronic problem. The current episode started more than 1 year ago. The problem is unchanged. The problem is controlled. Pertinent negatives include no headaches, palpitations or shortness of breath. Risk factors for coronary artery disease include dyslipidemia, post-menopausal state and sedentary lifestyle. Past treatments include ACE inhibitors and diuretics. The current treatment provides moderate improvement. Compliance problems include diet and exercise.  There is no history of CAD/MI or CVA.  Hyperlipidemia This is a chronic problem. The current episode started more than 1 year ago. Recent lipid tests were reviewed and are variable. She has no history of diabetes, hypothyroidism or obesity. Pertinent negatives include no shortness of breath. Current antihyperlipidemic treatment includes statins. The current treatment provides moderate improvement of lipids. Compliance problems include adherence to diet and adherence to exercise.  Risk factors for coronary artery disease include dyslipidemia, hypertension and post-menopausal.    GERD Protonix working better than zantac was- symptoms under good control Diabetes Patient has been under a lot of stress and says that blood sugar is running a little high when she checks it           ( 130-140 ). Not watching diet Asthma Uses advair daily- only uses albuterol maybe 2x a month. Patient does smoke about 6 cigarettes a day.     Review of Systems  Constitutional: Negative.   HENT: Negative.   Respiratory: Negative.  Negative for shortness of breath.   Cardiovascular: Negative.  Negative for palpitations.  Genitourinary: Negative.   Neurological: Negative.  Negative for  headaches.  Psychiatric/Behavioral: Negative.   All other systems reviewed and are negative.      Objective:   Physical Exam  Constitutional: She is oriented to person, place, and time. She appears well-developed and well-nourished.  HENT:  Nose: Nose normal.  Mouth/Throat: Oropharynx is clear and moist.  Eyes: EOM are normal.  Neck: Trachea normal, normal range of motion and full passive range of motion without pain. Neck supple. No JVD present. Carotid bruit is not present. No thyromegaly present.  Cardiovascular: Normal rate, regular rhythm, normal heart sounds and intact distal pulses.  Exam reveals no gallop and no friction rub.   No murmur heard. Pulmonary/Chest: Effort normal. She has wheezes (faint exp wheezes in bil bases).  Abdominal: Soft. Bowel sounds are normal. She exhibits no distension and no mass. There is no tenderness.  Musculoskeletal: Normal range of motion.  Lymphadenopathy:    She has no cervical adenopathy.  Neurological: She is alert and oriented to person, place, and time. She has normal reflexes.  Skin: Skin is warm and dry.  Multiple skin lesions on chest wall  Psychiatric: She has a normal mood and affect. Her behavior is normal. Judgment and thought content normal.     BP 134/75 mmHg  Pulse 108  Temp(Src) 96.8 F (36 C) (Oral)  Ht $R'5\' 4"'wE$  (1.626 m)  Wt 204 lb (92.534 kg)  BMI 35.00 kg/m2  LMP 04/14/1982  Results for orders placed or performed in visit on 06/01/14  POCT glycosylated hemoglobin (Hb A1C)  Result Value Ref Range   Hemoglobin A1C 9.2          Assessment & Plan:  1. Essential  hypertension Do not add salt to diet - CMP14+EGFR  2. Type 2 diabetes mellitus without complication Watch carbs Started on metformin today - POCT glycosylated hemoglobin (Hb A1C) - Microalbumin, urine - metFORMIN (GLUCOPHAGE) 500 MG tablet; Take 1 tablet (500 mg total) by mouth 2 (two) times daily with a meal.  Dispense: 180 tablet; Refill: 1  3.  Hyperlipidemia with target LDL less than 100 Low fat diet- continue exercsie - NMR, lipoprofile - rosuvastatin (CRESTOR) 40 MG tablet; TAKE 1 TABLET AT BEDTIME  Dispense: 90 tablet; Refill: 1  4. Smoker smoking cessation encouraged  5. BMI 35.0-35.9,adult Discussed diet and exercise for person with BMI >25 Will recheck weight in 3-6 months   6. Skin lesions, generalized Referral to dermatology  7. Gastroesophageal reflux disease without esophagitis Avoid spicy foods Do not eat 2 hours prior to bedtime - pantoprazole (PROTONIX) 40 MG tablet; Take 1 tablet (40 mg total) by mouth daily.  Dispense: 90 tablet; Refill: 1  8. Asthma, chronic, mild intermittent, uncomplicated - Fluticasone-Salmeterol (ADVAIR) 250-50 MCG/DOSE AEPB; Inhale 1 puff into the lungs every 12 (twelve) hours.  Dispense: 180 each; Refill: 1   prevnar today Labs pending Health maintenance reviewed Diet and exercise encouraged Continue all meds Follow up  In 3 months   [place], FNP

## 2014-06-02 LAB — CMP14+EGFR
ALT: 114 IU/L — AB (ref 0–32)
AST: 109 IU/L — ABNORMAL HIGH (ref 0–40)
Albumin/Globulin Ratio: 1.5 (ref 1.1–2.5)
Albumin: 4.3 g/dL (ref 3.6–4.8)
Alkaline Phosphatase: 98 IU/L (ref 39–117)
BUN/Creatinine Ratio: 21 (ref 11–26)
BUN: 16 mg/dL (ref 8–27)
Bilirubin Total: 0.6 mg/dL (ref 0.0–1.2)
CHLORIDE: 94 mmol/L — AB (ref 97–108)
CO2: 22 mmol/L (ref 18–29)
CREATININE: 0.76 mg/dL (ref 0.57–1.00)
Calcium: 10 mg/dL (ref 8.7–10.3)
GFR, EST AFRICAN AMERICAN: 94 mL/min/{1.73_m2} (ref >59)
GFR, EST NON AFRICAN AMERICAN: 81 mL/min/{1.73_m2} (ref >59)
Globulin, Total: 2.9 g/dL (ref 1.5–4.5)
Glucose: 240 mg/dL — ABNORMAL HIGH (ref 65–99)
Potassium: 4.1 mmol/L (ref 3.5–5.2)
Sodium: 134 mmol/L (ref 134–144)
Total Protein: 7.2 g/dL (ref 6.0–8.5)

## 2014-06-02 LAB — NMR, LIPOPROFILE
Cholesterol: 197 mg/dL (ref 100–199)
HDL Cholesterol by NMR: 33 mg/dL — ABNORMAL LOW (ref >39)
HDL Particle Number: 25.2 umol/L — ABNORMAL LOW (ref >=30.5)
LDL PARTICLE NUMBER: 1770 nmol/L — AB (ref <1000)
LDL Size: 19.8 nm (ref >20.5)
LDL-C: 87 mg/dL (ref 0–99)
LP-IR Score: 81 — ABNORMAL HIGH (ref <=45)
Small LDL Particle Number: 1404 nmol/L — ABNORMAL HIGH (ref <=527)
Triglycerides by NMR: 384 mg/dL — ABNORMAL HIGH (ref 0–149)

## 2014-06-02 LAB — MICROALBUMIN, URINE: MICROALBUM., U, RANDOM: 35.5 ug/mL

## 2014-06-13 ENCOUNTER — Other Ambulatory Visit: Payer: Self-pay | Admitting: Nurse Practitioner

## 2014-06-14 ENCOUNTER — Encounter: Payer: Self-pay | Admitting: Family Medicine

## 2014-06-14 ENCOUNTER — Ambulatory Visit (INDEPENDENT_AMBULATORY_CARE_PROVIDER_SITE_OTHER): Payer: Medicare Other | Admitting: Family Medicine

## 2014-06-14 VITALS — BP 114/71 | HR 113 | Temp 98.1°F | Ht 64.0 in | Wt 204.2 lb

## 2014-06-14 DIAGNOSIS — N3090 Cystitis, unspecified without hematuria: Secondary | ICD-10-CM

## 2014-06-14 DIAGNOSIS — R300 Dysuria: Secondary | ICD-10-CM | POA: Diagnosis not present

## 2014-06-14 LAB — POCT URINALYSIS DIPSTICK
Bilirubin, UA: NEGATIVE
Glucose, UA: NEGATIVE
Ketones, UA: NEGATIVE
NITRITE UA: NEGATIVE
PROTEIN UA: NEGATIVE
Spec Grav, UA: 1.01
Urobilinogen, UA: NEGATIVE
pH, UA: 6.5

## 2014-06-14 LAB — POCT UA - MICROSCOPIC ONLY
Bacteria, U Microscopic: NEGATIVE
CASTS, UR, LPF, POC: NEGATIVE
CRYSTALS, UR, HPF, POC: NEGATIVE
MUCUS UA: NEGATIVE
Yeast, UA: NEGATIVE

## 2014-06-14 MED ORDER — CIPROFLOXACIN HCL 500 MG PO TABS: 500.0000 mg | ORAL_TABLET | Freq: Two times a day (BID) | ORAL | Status: DC

## 2014-06-14 NOTE — Progress Notes (Signed)
Subjective:  Patient ID: Rita Payne, female    DOB: [birth year]  Age: 69 y.o. MRN: [id number]  CC: Urinary Tract Infection   HPI Rita Payne presents for frequency and dysuria of 2-3 days' duration. No fever chills sweats no abdominal pain or nausea.  History Rita Payne has a past medical history of Asthma; Hypertension; Hyperlipidemia; Arthritis; Pneumonia; Diabetes mellitus without complication; Sleep apnea; Kidney stones; UTI (urinary tract infection); and Teeth problem.   She has past surgical history that includes Tonsillectomy; Abdominal hysterectomy; Cholecystectomy; Knee arthroscopy; Hernia repair; kidney tumor removal ; Blepharoptosis repair; Total hip arthroplasty (Right, 08/25/2012); Joint replacement; and Lumbar laminectomy/decompression microdiscectomy (N/A, 12/08/2013).   Her family history includes Diabetes in her mother, sister, and sister; Heart disease in her brother, father, and mother; Stroke in her father, mother, and sister; Vision loss in her brother.She reports that she has been smoking Cigarettes.  She has a 1.5 pack-year smoking history. She has never used smokeless tobacco. She reports that she does not drink alcohol or use illicit drugs.  Outpatient Prescriptions Prior to Visit  Medication Sig Dispense Refill  . albuterol (PROAIR HFA) 108 (90 BASE) MCG/ACT inhaler INHALE 2 PUFFS INTO THE LUNGS EVERY 6 HOURS AS NEEDED FOR WHEEZING 3 each 1  . aspirin 81 MG tablet Take 1 tablet (81 mg total) by mouth daily. Resume 4 days post-op 30 tablet   . celecoxib (CELEBREX) 200 MG capsule TAKE 1 CAPSULE EVERY 12 HOURS 180 capsule 0  . cetirizine (ZYRTEC) 10 MG tablet Take 1 tablet (10 mg total) by mouth daily. 90 tablet 1  . cholecalciferol (VITAMIN D) 1000 UNITS tablet Take 1,000 Units by mouth daily.    . Fluticasone-Salmeterol (ADVAIR) 250-50 MCG/DOSE AEPB Inhale 1 puff into the lungs every 12 (twelve) hours. 180 each 1  . metFORMIN (GLUCOPHAGE) 500 MG tablet Take 1 tablet (500  mg total) by mouth 2 (two) times daily with a meal. 180 tablet 1  . MICARDIS HCT 80-25 MG per tablet TAKE 1 TABLET EVERY MORNING 90 tablet 1  . pantoprazole (PROTONIX) 40 MG tablet Take 1 tablet (40 mg total) by mouth daily. 90 tablet 1  . pantoprazole (PROTONIX) 40 MG tablet TAKE 1 TABLET DAILY 90 tablet 1  . rosuvastatin (CRESTOR) 40 MG tablet TAKE 1 TABLET AT BEDTIME 90 tablet 1   No facility-administered medications prior to visit.    ROS Review of Systems  Constitutional: Negative for fever, chills and diaphoresis.  HENT: Negative for congestion.   Eyes: Negative for visual disturbance.  Respiratory: Negative for cough and shortness of breath.   Cardiovascular: Negative for chest pain and palpitations.  Gastrointestinal: Negative for nausea, diarrhea and constipation.  Genitourinary: Positive for dysuria, urgency and frequency. Negative for hematuria, flank pain, decreased urine volume, menstrual problem and pelvic pain.  Musculoskeletal: Negative for joint swelling and arthralgias.  Skin: Negative for rash.  Neurological: Negative for dizziness and numbness.    Objective:  BP 114/71 mmHg  Pulse 113  Temp(Src) 98.1 F (36.7 C) (Oral)  Ht 5\' 4"  (1.626 m)  Wt 204 lb 3.2 oz (92.625 kg)  BMI 35.03 kg/m2  LMP 04/14/1982  BP Readings from Last 3 Encounters:  06/14/14 114/71  06/01/14 134/75  04/22/14 131/75    Wt Readings from Last 3 Encounters:  06/14/14 204 lb 3.2 oz (92.625 kg)  06/01/14 204 lb (92.534 kg)  04/22/14 206 lb (93.441 kg)     Physical Exam  Constitutional: She is oriented to person, place, and time.  She appears well-developed and well-nourished. No distress.  HENT:  Head: Normocephalic and atraumatic.  Right Ear: External ear normal.  Left Ear: External ear normal.  Nose: Nose normal.  Mouth/Throat: Oropharynx is clear and moist.  Eyes: Conjunctivae and EOM are normal. Pupils are equal, round, and reactive to light.  Neck: Normal range of motion.  Neck supple. No thyromegaly present.  Cardiovascular: Normal rate, regular rhythm and normal heart sounds.   No murmur heard. Pulmonary/Chest: Effort normal and breath sounds normal. No respiratory distress. She has no wheezes. She has no rales.  Abdominal: Soft. Bowel sounds are normal. She exhibits no distension. There is no tenderness.  Lymphadenopathy:    She has no cervical adenopathy.  Neurological: She is alert and oriented to person, place, and time. She has normal reflexes.  Skin: Skin is warm and dry.  Psychiatric: She has a normal mood and affect. Her behavior is normal. Judgment and thought content normal.    Lab Results  Component Value Date   HGBA1C 9.2 06/01/2014   HGBA1C 7.0 02/17/2014   HGBA1C 7.9% 11/11/2013    Lab Results  Component Value Date   WBC 10.3* 12/17/2013   HGB 12.9 12/17/2013   HCT 37.9 12/17/2013   PLT 226 12/09/2013   GLUCOSE 240* 06/01/2014   CHOL 197 06/01/2014   TRIG 384* 06/01/2014   HDL 33* 06/01/2014   LDLCALC 155* 08/03/2013   ALT 114* 06/01/2014   AST 109* 06/01/2014   NA 134 06/01/2014   K 4.1 06/01/2014   CL 94* 06/01/2014   CREATININE 0.76 06/01/2014   BUN 16 06/01/2014   CO2 22 06/01/2014   INR 0.92 08/14/2012   HGBA1C 9.2 06/01/2014    Dg Chest 2 View  11/30/2013   CLINICAL DATA:  Preoperative films.  Hypertension.  EXAM: CHEST  2 VIEW  COMPARISON:  PA and lateral chest 08/14/2012.  FINDINGS: Mild eventration of the anterior right hemidiaphragm is unchanged. The lungs are clear. Heart size is normal. No pneumothorax or pleural effusion.  IMPRESSION: No acute disease.   Electronically Signed   By: Inge Rise M.D.   On: 11/30/2013 13:19   Dg Lumbar Spine 2-3 Views  11/30/2013   CLINICAL DATA:  Preoperative exam prior to lumbar surgery; history of spinal stenosis  EXAM: LUMBAR SPINE - 2-3 VIEW  COMPARISON:  Lumbar spine MRI of October 22, 2013  FINDINGS: The lumbar vertebral bodies are preserved in height. There is  high-grade disc space narrowing at L3-4, L4-5, and L5-S1. There is moderate facet joint hypertrophy at L5-S1. There is no spondylolisthesis. The pedicles and transverse processes are intact. The observed portions of the sacrum are unremarkable.  IMPRESSION: There is high-grade disc space narrowing at L3-4, L4-5, and L5-S1.   Electronically Signed   By: David  Martinique   On: 11/30/2013 13:50    Assessment & Plan:   Rita Payne was seen today for urinary tract infection.  Diagnoses and all orders for this visit:  Dysuria Orders: -     POCT UA - Microscopic Only -     POCT urinalysis dipstick -     Ambulatory referral to Urology  Cystitis Orders: -     Ambulatory referral to Urology  Other orders -     Discontinue: ciprofloxacin (CIPRO) 500 MG tablet; Take 1 tablet (500 mg total) by mouth 2 (two) times daily. -     ciprofloxacin (CIPRO) 500 MG tablet; Take 1 tablet (500 mg total) by mouth 2 (two) times daily.  I am having Rita Payne maintain her cholecalciferol, albuterol, cetirizine, aspirin, MICARDIS HCT, rosuvastatin, pantoprazole, Fluticasone-Salmeterol, metFORMIN, pantoprazole, celecoxib, and ciprofloxacin.  Meds ordered this encounter  Medications  . DISCONTD: ciprofloxacin (CIPRO) 500 MG tablet    Sig: Take 1 tablet (500 mg total) by mouth 2 (two) times daily.    Dispense:  20 tablet    Refill:  0  . ciprofloxacin (CIPRO) 500 MG tablet    Sig: Take 1 tablet (500 mg total) by mouth 2 (two) times daily.    Dispense:  20 tablet    Refill:  0     Follow-up: Return if symptoms worsen or fail to improve.  Claretta Fraise, M.D.

## 2014-06-20 ENCOUNTER — Other Ambulatory Visit: Payer: Self-pay | Admitting: Nurse Practitioner

## 2014-06-28 DIAGNOSIS — H5213 Myopia, bilateral: Secondary | ICD-10-CM | POA: Diagnosis not present

## 2014-06-28 DIAGNOSIS — E119 Type 2 diabetes mellitus without complications: Secondary | ICD-10-CM | POA: Diagnosis not present

## 2014-06-28 DIAGNOSIS — H43813 Vitreous degeneration, bilateral: Secondary | ICD-10-CM | POA: Diagnosis not present

## 2014-06-28 DIAGNOSIS — H2513 Age-related nuclear cataract, bilateral: Secondary | ICD-10-CM | POA: Diagnosis not present

## 2014-06-29 ENCOUNTER — Encounter: Payer: Self-pay | Admitting: *Deleted

## 2014-07-04 ENCOUNTER — Encounter: Payer: Self-pay | Admitting: *Deleted

## 2014-07-05 ENCOUNTER — Telehealth: Payer: Self-pay | Admitting: Nurse Practitioner

## 2014-07-27 DIAGNOSIS — N3020 Other chronic cystitis without hematuria: Secondary | ICD-10-CM | POA: Diagnosis not present

## 2014-07-27 DIAGNOSIS — N393 Stress incontinence (female) (male): Secondary | ICD-10-CM | POA: Diagnosis not present

## 2014-07-27 DIAGNOSIS — N362 Urethral caruncle: Secondary | ICD-10-CM | POA: Diagnosis not present

## 2014-08-09 DIAGNOSIS — N393 Stress incontinence (female) (male): Secondary | ICD-10-CM | POA: Diagnosis not present

## 2014-08-16 ENCOUNTER — Other Ambulatory Visit: Payer: Self-pay | Admitting: Nurse Practitioner

## 2014-09-02 DIAGNOSIS — N393 Stress incontinence (female) (male): Secondary | ICD-10-CM | POA: Diagnosis not present

## 2014-09-02 DIAGNOSIS — N952 Postmenopausal atrophic vaginitis: Secondary | ICD-10-CM | POA: Diagnosis not present

## 2014-09-02 DIAGNOSIS — N3020 Other chronic cystitis without hematuria: Secondary | ICD-10-CM | POA: Diagnosis not present

## 2014-09-08 ENCOUNTER — Other Ambulatory Visit: Payer: Self-pay | Admitting: Urology

## 2014-09-09 ENCOUNTER — Other Ambulatory Visit: Payer: Self-pay | Admitting: Family Medicine

## 2014-09-13 ENCOUNTER — Encounter: Payer: Self-pay | Admitting: Nurse Practitioner

## 2014-09-13 ENCOUNTER — Ambulatory Visit (INDEPENDENT_AMBULATORY_CARE_PROVIDER_SITE_OTHER): Payer: Medicare Other | Admitting: Nurse Practitioner

## 2014-09-13 VITALS — BP 115/74 | HR 90 | Temp 96.8°F | Ht 64.0 in | Wt 198.0 lb

## 2014-09-13 DIAGNOSIS — E119 Type 2 diabetes mellitus without complications: Secondary | ICD-10-CM

## 2014-09-13 DIAGNOSIS — F17200 Nicotine dependence, unspecified, uncomplicated: Secondary | ICD-10-CM

## 2014-09-13 DIAGNOSIS — E875 Hyperkalemia: Secondary | ICD-10-CM

## 2014-09-13 DIAGNOSIS — J4520 Mild intermittent asthma, uncomplicated: Secondary | ICD-10-CM | POA: Diagnosis not present

## 2014-09-13 DIAGNOSIS — Z6835 Body mass index (BMI) 35.0-35.9, adult: Secondary | ICD-10-CM | POA: Diagnosis not present

## 2014-09-13 DIAGNOSIS — I10 Essential (primary) hypertension: Secondary | ICD-10-CM | POA: Diagnosis not present

## 2014-09-13 DIAGNOSIS — M810 Age-related osteoporosis without current pathological fracture: Secondary | ICD-10-CM

## 2014-09-13 DIAGNOSIS — E669 Obesity, unspecified: Secondary | ICD-10-CM

## 2014-09-13 DIAGNOSIS — Z720 Tobacco use: Secondary | ICD-10-CM

## 2014-09-13 DIAGNOSIS — E785 Hyperlipidemia, unspecified: Secondary | ICD-10-CM | POA: Diagnosis not present

## 2014-09-13 LAB — POCT GLYCOSYLATED HEMOGLOBIN (HGB A1C): HEMOGLOBIN A1C: 7

## 2014-09-13 NOTE — Patient Instructions (Signed)
Diabetes and Exercise Diabetes mellitus is a common, chronic disease, in which the pancreas is unable to adequately control blood glucose (sugar) levels. There are 2 types of diabetes. Type 1 diabetes patients are unable to produce insulin, a hormone that causes sugar in the blood to be stored in the body. People with type 1 diabetes may compensate by giving themselves injections of insulin. Type 2 diabetes involves not producing adequate amounts of insulin to control blood glucose levels. People with type 2 diabetes control their blood glucose by monitoring their food intake or by taking medicine. Exercise is an important part of diabetes treatment. During exercise, the muscles use a greater amount of glucose from the blood for energy. This lowers your blood glucose, which is the same effect you would get from taking insulin. It has been shown that endurance athletes are more sensitive to insulin than inactive people. SYMPTOMS  Many people with a mild case of diabetes have no symptoms. However, if left uncontrolled, diabetes can lead to several complications that could be prevented with treatment of the disease. General symptoms of diabetes include:   Frequent urination (polyuria).  Frequent thirst and drinking (polydipsia).  Increased food consumption (polyphagia).  Fatigue.  Poor exercise performance.  Blurred vision.  Inflammation of the vagina (vaginitis) caused by fungal infections.  Skin infections (uncommon).  Numbness in the feet, caused by nerve injury.  Kidney disease. CAUSES  The cause of most cases of diabetes is unknown. In children, diabetes is often due to an autoimmune response to the cells in the pancreas that make insulin. It is also linked with other diseases, such as cystic fibrosis. Diabetes may have a genetic link. PREVENTION  Athletes should strive to begin exercise with blood glucose in a well-controlled state.  Feet should always be kept clean and  dry.  Activities in which low blood sugar levels cannot be treated easily (scuba diving, rock climbing, swimming) should be avoided.  Anticipate alterations in diet or training to avoid low blood sugar (hypoglycemia) and high blood sugar (hyperglycemia).  Athletes should try to increase sugar consumption after strenuous exercise to avoid hypoglycemia.  Short-acting insulin should not be injected into an actively exercising muscle. The athlete should rest the injection site for about 1 hour after exercise.  Patients with diabetes should get routine checkups of the feet to prevent complications. PROGNOSIS  Exercise provides many benefits to the person with diabetes:   Reduced body fat.  Lower blood pressure.  Often, reduced need for medicines.  Improved exercise tolerance.  Lower insulin levels.  Weight loss.  Improved lipid profile (decreased cholesterol and low-density lipoproteins). RELATED COMPLICATIONS  If performed incorrectly, exercise can result in complications of diabetes:   Poor control of blood sugar, when exercise is performed at the wrong time.  Increase in renal disease, from loss of body fluids (dehydration).  Increased risk of nerve injury (neuropathy) when performing exercises that increase foot injury.  Increased risk of eye problems when performing activities that involve breath holding or lowering or jarring the head.  Increased risk of sudden death from exercise in patients with heart disease.  Worsening of hypertension with heavy lifting (more than 10 lb/4.5 kg). Altered blood glucose and insulin dose as a result of mild illness that produces loss of appetite.  Altered uptake of insulin after injection when insulin injection site is changed. NOTE: Exercise can lower blood glucose effectively, but the effects are short-lasting (no more than a couple of days). Exercise has been shown to   improve your sensitivity to insulin. This may alter how your body  responds to a given dose of injected insulin. It is important for every patient with diabetes to know how his or her body may react to exercise, and to adjust insulin dosages accordingly. TREATMENT  Eat about 1 to 3 hours before exercise.  Check blood glucose immediately before and after exercise.  Stop exercise if blood glucose is more than 250 mg/dL.  Stop exercise if blood glucose is less than 100 mg/dL.  Do not exercise within 1 hour of an insulin injection.  Be prepared to treat low blood glucose while exercising. Keep some sugar product with you, such as a candy bar.  For prolonged exercise, use a sports drink to maintain your glucose level.  Replace used-up glucose in the body after exercise.  Consume fluids during and after exercise to avoid dehydration. SEEK MEDICAL CARE IF:  You have vision changes after a run.  You notice a loss of sensation in your feet after exercise.  You have increased numbness, tingling, or pins and needles sensations after exercise.  You have chest pain during or after exercise.  You have a fast, irregular heartbeat (palpitations) during or after exercise.  Your exercise tolerance gets worse.  You have fainting or dizzy spells for brief periods during or after exercise. Document Released: 12/24/2004 Document Revised: 05/10/2013 Document Reviewed: 04/07/2008 Exit[employer] Patient Information 2015 [employer]. This information is not intended to replace advice given to you by your health care provider. Make sure you discuss any questions you have with your health care provider.  

## 2014-09-13 NOTE — Progress Notes (Signed)
Subjective:    Patient ID: Rita Payne, female    DOB: 1945/02/01, 69 y.o.   MRN: [id number]   Patient here today for follow up of chronic medical problems. Joined gym and has been exercising 3 days a week. Wants referral to dermatology for skin lesions   Hypertension This is a chronic problem. The current episode started more than 1 year ago. The problem is unchanged. The problem is controlled. Pertinent negatives include no headaches, palpitations or shortness of breath. Risk factors for coronary artery disease include dyslipidemia, post-menopausal state and sedentary lifestyle. Past treatments include ACE inhibitors and diuretics. The current treatment provides moderate improvement. Compliance problems include diet and exercise.  There is no history of CAD/MI or CVA.  Hyperlipidemia This is a chronic problem. The current episode started more than 1 year ago. Recent lipid tests were reviewed and are variable. She has no history of diabetes, hypothyroidism or obesity. Pertinent negatives include no shortness of breath. Current antihyperlipidemic treatment includes statins. The current treatment provides moderate improvement of lipids. Compliance problems include adherence to diet and adherence to exercise.  Risk factors for coronary artery disease include dyslipidemia, hypertension and post-menopausal.  GERD Protonix working better than zantac was- symptoms under good control Diabetes Patient has been under a lot of stress and says that blood sugar is running a little high when she checks it  ( 130-140 ). Not watching diet Asthma Uses advair daily- only uses albuterol maybe 2x a month. Patient does smoke about 6 cigarettes a day.     Review of Systems  Constitutional: Negative.   HENT: Negative.   Respiratory: Negative.  Negative for shortness of breath.   Cardiovascular: Negative.  Negative for palpitations.  Genitourinary: Negative.   Neurological: Negative.  Negative for headaches.    Psychiatric/Behavioral: Negative.   All other systems reviewed and are negative.      Objective:   Physical Exam  Constitutional: She is oriented to person, place, and time. She appears well-developed and well-nourished.  HENT:  Nose: Nose normal.  Mouth/Throat: Oropharynx is clear and moist.  Eyes: EOM are normal.  Neck: Trachea normal, normal range of motion and full passive range of motion without pain. Neck supple. No JVD present. Carotid bruit is not present. No thyromegaly present.  Cardiovascular: Normal rate, regular rhythm, normal heart sounds and intact distal pulses.  Exam reveals no gallop and no friction rub.   No murmur heard. Pulmonary/Chest: Effort normal. She has wheezes (faint exp wheezes in bil bases).  Abdominal: Soft. Bowel sounds are normal. She exhibits no distension and no mass. There is no tenderness.  Musculoskeletal: Normal range of motion.  Lymphadenopathy:    She has no cervical adenopathy.  Neurological: She is alert and oriented to person, place, and time. She has normal reflexes.  Skin: Skin is warm and dry.  Multiple skin lesions on chest wall  Psychiatric: She has a normal mood and affect. Her behavior is normal. Judgment and thought content normal.     BP 115/74 mmHg  Pulse 90  Temp(Src) 96.8 F (36 C) (Oral)  Ht $R'5\' 4"'er$  (1.626 m)  Wt 198 lb (89.812 kg)  BMI 33.97 kg/m2  LMP 04/14/1982  Results for orders placed or performed in visit on 09/13/14  POCT glycosylated hemoglobin (Hb A1C)  Result Value Ref Range   Hemoglobin A1C 7.0          Assessment & Plan:  1. Essential hypertension Do nt add salt to diet - CMP14+EGFR  2. Type 2 diabetes mellitus without complication Continue to exercise and carb count - POCT glycosylated hemoglobin (Hb A1C)  3. Hyperlipidemia with target LDL less than 100 Low fat diet - Lipid panel  4. Asthma, chronic, mild intermittent, uncomplicated  5. Senile osteoporosis  6. Obese Discussed diet and  exercise for person with BMI >25 Will recheck weight in 3-6 months   7. Smoker Smoking cessation encouraged  8. BMI 35.0-35.9,adult    Labs pending Health maintenance reviewed Diet and exercise encouraged Continue all meds Follow up  In 3 months    [place], FNP

## 2014-09-14 LAB — CMP14+EGFR
A/G RATIO: 1.4 (ref 1.1–2.5)
ALK PHOS: 87 IU/L (ref 39–117)
ALT: 90 IU/L — AB (ref 0–32)
AST: 63 IU/L — AB (ref 0–40)
Albumin: 4.3 g/dL (ref 3.6–4.8)
BILIRUBIN TOTAL: 0.6 mg/dL (ref 0.0–1.2)
BUN/Creatinine Ratio: 18 (ref 11–26)
BUN: 17 mg/dL (ref 8–27)
CHLORIDE: 95 mmol/L — AB (ref 97–108)
CO2: 24 mmol/L (ref 18–29)
Calcium: 10 mg/dL (ref 8.7–10.3)
Creatinine, Ser: 0.93 mg/dL (ref 0.57–1.00)
GFR calc Af Amer: 74 mL/min/{1.73_m2} (ref >59)
GFR calc non Af Amer: 64 mL/min/{1.73_m2} (ref >59)
Globulin, Total: 3 g/dL (ref 1.5–4.5)
Glucose: 152 mg/dL — ABNORMAL HIGH (ref 65–99)
POTASSIUM: 5.5 mmol/L — AB (ref 3.5–5.2)
Sodium: 141 mmol/L (ref 134–144)
Total Protein: 7.3 g/dL (ref 6.0–8.5)

## 2014-09-14 LAB — LIPID PANEL
Chol/HDL Ratio: 7.3 ratio units — ABNORMAL HIGH (ref 0.0–4.4)
Cholesterol, Total: 241 mg/dL — ABNORMAL HIGH (ref 100–199)
HDL: 33 mg/dL — AB (ref >39)
LDL Calculated: 145 mg/dL — ABNORMAL HIGH (ref 0–99)
TRIGLYCERIDES: 317 mg/dL — AB (ref 0–149)
VLDL Cholesterol Cal: 63 mg/dL — ABNORMAL HIGH (ref 5–40)

## 2014-09-15 ENCOUNTER — Other Ambulatory Visit (INDEPENDENT_AMBULATORY_CARE_PROVIDER_SITE_OTHER): Payer: Medicare Other

## 2014-09-15 ENCOUNTER — Other Ambulatory Visit: Payer: Self-pay | Admitting: Nurse Practitioner

## 2014-09-15 DIAGNOSIS — I10 Essential (primary) hypertension: Secondary | ICD-10-CM | POA: Diagnosis not present

## 2014-09-15 DIAGNOSIS — E119 Type 2 diabetes mellitus without complications: Secondary | ICD-10-CM | POA: Diagnosis not present

## 2014-09-15 NOTE — Progress Notes (Signed)
Lab only 

## 2014-09-15 NOTE — Addendum Note (Signed)
Addended by: Thana Ates on: 09/15/2014 03:58 PM   Modules accepted: Orders

## 2014-09-16 DIAGNOSIS — K760 Fatty (change of) liver, not elsewhere classified: Secondary | ICD-10-CM | POA: Diagnosis not present

## 2014-09-16 DIAGNOSIS — R312 Other microscopic hematuria: Secondary | ICD-10-CM | POA: Diagnosis not present

## 2014-09-16 LAB — BMP8+EGFR
BUN/Creatinine Ratio: 19 (ref 11–26)
BUN: 16 mg/dL (ref 8–27)
CALCIUM: 9.7 mg/dL (ref 8.7–10.3)
CO2: 25 mmol/L (ref 18–29)
Chloride: 96 mmol/L — ABNORMAL LOW (ref 97–108)
Creatinine, Ser: 0.86 mg/dL (ref 0.57–1.00)
GFR calc Af Amer: 81 mL/min/{1.73_m2} (ref >59)
GFR, EST NON AFRICAN AMERICAN: 70 mL/min/{1.73_m2} (ref >59)
GLUCOSE: 148 mg/dL — AB (ref 65–99)
POTASSIUM: 4.1 mmol/L (ref 3.5–5.2)
SODIUM: 137 mmol/L (ref 134–144)

## 2014-09-20 ENCOUNTER — Other Ambulatory Visit: Payer: Self-pay | Admitting: Urology

## 2014-09-21 MED ORDER — FLEET ENEMA 7-19 GM/118ML RE ENEM: 1.0000 | ENEMA | Freq: Once | RECTAL | Status: AC

## 2014-09-21 MED ORDER — MAGNESIUM CITRATE PO SOLN: 1.0000 | Freq: Once | ORAL | Status: AC

## 2014-09-22 ENCOUNTER — Encounter (HOSPITAL_BASED_OUTPATIENT_CLINIC_OR_DEPARTMENT_OTHER): Payer: Self-pay | Admitting: *Deleted

## 2014-09-29 ENCOUNTER — Encounter (HOSPITAL_BASED_OUTPATIENT_CLINIC_OR_DEPARTMENT_OTHER): Payer: Self-pay | Admitting: *Deleted

## 2014-09-29 DIAGNOSIS — F17210 Nicotine dependence, cigarettes, uncomplicated: Secondary | ICD-10-CM | POA: Diagnosis not present

## 2014-09-29 DIAGNOSIS — G4733 Obstructive sleep apnea (adult) (pediatric): Secondary | ICD-10-CM | POA: Diagnosis not present

## 2014-09-29 DIAGNOSIS — E119 Type 2 diabetes mellitus without complications: Secondary | ICD-10-CM | POA: Diagnosis not present

## 2014-09-29 DIAGNOSIS — Z96649 Presence of unspecified artificial hip joint: Secondary | ICD-10-CM | POA: Diagnosis not present

## 2014-09-29 DIAGNOSIS — J45909 Unspecified asthma, uncomplicated: Secondary | ICD-10-CM | POA: Diagnosis not present

## 2014-09-29 DIAGNOSIS — Z792 Long term (current) use of antibiotics: Secondary | ICD-10-CM | POA: Diagnosis not present

## 2014-09-29 DIAGNOSIS — G709 Myoneural disorder, unspecified: Secondary | ICD-10-CM | POA: Diagnosis not present

## 2014-09-29 DIAGNOSIS — N952 Postmenopausal atrophic vaginitis: Secondary | ICD-10-CM | POA: Diagnosis not present

## 2014-09-29 DIAGNOSIS — K219 Gastro-esophageal reflux disease without esophagitis: Secondary | ICD-10-CM | POA: Diagnosis not present

## 2014-09-29 DIAGNOSIS — Z79899 Other long term (current) drug therapy: Secondary | ICD-10-CM | POA: Diagnosis not present

## 2014-09-29 DIAGNOSIS — N340 Urethral abscess: Secondary | ICD-10-CM | POA: Diagnosis not present

## 2014-09-29 DIAGNOSIS — Z7982 Long term (current) use of aspirin: Secondary | ICD-10-CM | POA: Diagnosis not present

## 2014-09-29 DIAGNOSIS — Z791 Long term (current) use of non-steroidal anti-inflammatories (NSAID): Secondary | ICD-10-CM | POA: Diagnosis not present

## 2014-09-29 DIAGNOSIS — M1990 Unspecified osteoarthritis, unspecified site: Secondary | ICD-10-CM | POA: Diagnosis not present

## 2014-09-29 DIAGNOSIS — Z87440 Personal history of urinary (tract) infections: Secondary | ICD-10-CM | POA: Diagnosis not present

## 2014-09-29 DIAGNOSIS — Z7951 Long term (current) use of inhaled steroids: Secondary | ICD-10-CM | POA: Diagnosis not present

## 2014-09-29 DIAGNOSIS — Z87442 Personal history of urinary calculi: Secondary | ICD-10-CM | POA: Diagnosis not present

## 2014-09-29 DIAGNOSIS — E780 Pure hypercholesterolemia: Secondary | ICD-10-CM | POA: Diagnosis not present

## 2014-09-29 DIAGNOSIS — I10 Essential (primary) hypertension: Secondary | ICD-10-CM | POA: Diagnosis not present

## 2014-09-29 DIAGNOSIS — N393 Stress incontinence (female) (male): Secondary | ICD-10-CM | POA: Diagnosis not present

## 2014-09-29 DIAGNOSIS — N3020 Other chronic cystitis without hematuria: Secondary | ICD-10-CM | POA: Diagnosis not present

## 2014-09-29 NOTE — Progress Notes (Signed)
To [facility] at 0900-Cbg,Hg,Ekg on arrival-Instructed Npo after Mn-refrain from smoking also.To use inhaler,bring rescue inhaler-take protonix,zyrtec with small amt water that am-follow bowel prep per office instructions.states understands.

## 2014-10-02 ENCOUNTER — Other Ambulatory Visit: Payer: Self-pay | Admitting: Nurse Practitioner

## 2014-10-03 ENCOUNTER — Ambulatory Visit (HOSPITAL_BASED_OUTPATIENT_CLINIC_OR_DEPARTMENT_OTHER)
Admission: RE | Admit: 2014-10-03 | Discharge: 2014-10-03 | Disposition: A | Discharge status: Home / Self Care | Payer: Medicare Other | Source: Ambulatory Visit | Attending: Urology | Admitting: Urology

## 2014-10-03 ENCOUNTER — Encounter (HOSPITAL_BASED_OUTPATIENT_CLINIC_OR_DEPARTMENT_OTHER): Payer: Self-pay | Admitting: Certified Registered"

## 2014-10-03 ENCOUNTER — Encounter (HOSPITAL_BASED_OUTPATIENT_CLINIC_OR_DEPARTMENT_OTHER)
Admission: RE | Disposition: A | Discharge status: Home / Self Care | Payer: Self-pay | Source: Ambulatory Visit | Attending: Urology

## 2014-10-03 ENCOUNTER — Ambulatory Visit (HOSPITAL_BASED_OUTPATIENT_CLINIC_OR_DEPARTMENT_OTHER): Payer: Medicare Other | Admitting: Certified Registered"

## 2014-10-03 DIAGNOSIS — Z87440 Personal history of urinary (tract) infections: Secondary | ICD-10-CM | POA: Insufficient documentation

## 2014-10-03 DIAGNOSIS — E119 Type 2 diabetes mellitus without complications: Secondary | ICD-10-CM | POA: Diagnosis not present

## 2014-10-03 DIAGNOSIS — Z87442 Personal history of urinary calculi: Secondary | ICD-10-CM | POA: Insufficient documentation

## 2014-10-03 DIAGNOSIS — N340 Urethral abscess: Secondary | ICD-10-CM | POA: Insufficient documentation

## 2014-10-03 DIAGNOSIS — Z791 Long term (current) use of non-steroidal anti-inflammatories (NSAID): Secondary | ICD-10-CM | POA: Insufficient documentation

## 2014-10-03 DIAGNOSIS — N393 Stress incontinence (female) (male): Secondary | ICD-10-CM | POA: Diagnosis not present

## 2014-10-03 DIAGNOSIS — Z96649 Presence of unspecified artificial hip joint: Secondary | ICD-10-CM | POA: Diagnosis not present

## 2014-10-03 DIAGNOSIS — Z7951 Long term (current) use of inhaled steroids: Secondary | ICD-10-CM | POA: Insufficient documentation

## 2014-10-03 DIAGNOSIS — Z792 Long term (current) use of antibiotics: Secondary | ICD-10-CM | POA: Insufficient documentation

## 2014-10-03 DIAGNOSIS — M1990 Unspecified osteoarthritis, unspecified site: Secondary | ICD-10-CM | POA: Insufficient documentation

## 2014-10-03 DIAGNOSIS — E780 Pure hypercholesterolemia: Secondary | ICD-10-CM | POA: Insufficient documentation

## 2014-10-03 DIAGNOSIS — F17210 Nicotine dependence, cigarettes, uncomplicated: Secondary | ICD-10-CM | POA: Insufficient documentation

## 2014-10-03 DIAGNOSIS — E669 Obesity, unspecified: Secondary | ICD-10-CM | POA: Diagnosis not present

## 2014-10-03 DIAGNOSIS — Z7982 Long term (current) use of aspirin: Secondary | ICD-10-CM | POA: Insufficient documentation

## 2014-10-03 DIAGNOSIS — N3020 Other chronic cystitis without hematuria: Secondary | ICD-10-CM | POA: Insufficient documentation

## 2014-10-03 DIAGNOSIS — I10 Essential (primary) hypertension: Secondary | ICD-10-CM | POA: Insufficient documentation

## 2014-10-03 DIAGNOSIS — J45909 Unspecified asthma, uncomplicated: Secondary | ICD-10-CM | POA: Diagnosis not present

## 2014-10-03 DIAGNOSIS — N952 Postmenopausal atrophic vaginitis: Secondary | ICD-10-CM | POA: Insufficient documentation

## 2014-10-03 DIAGNOSIS — Z79899 Other long term (current) drug therapy: Secondary | ICD-10-CM | POA: Insufficient documentation

## 2014-10-03 DIAGNOSIS — G709 Myoneural disorder, unspecified: Secondary | ICD-10-CM | POA: Insufficient documentation

## 2014-10-03 DIAGNOSIS — G4733 Obstructive sleep apnea (adult) (pediatric): Secondary | ICD-10-CM | POA: Insufficient documentation

## 2014-10-03 DIAGNOSIS — K219 Gastro-esophageal reflux disease without esophagitis: Secondary | ICD-10-CM | POA: Insufficient documentation

## 2014-10-03 HISTORY — DX: Type 2 diabetes mellitus without complications: E11.9

## 2014-10-03 HISTORY — DX: Personal history of urinary calculi: Z87.442

## 2014-10-03 HISTORY — DX: Atrioventricular block, first degree: I44.0

## 2014-10-03 HISTORY — DX: Mild intermittent asthma, uncomplicated: J45.20

## 2014-10-03 HISTORY — DX: Gastro-esophageal reflux disease without esophagitis: K21.9

## 2014-10-03 HISTORY — PX: PUBOVAGINAL SLING: SHX1035

## 2014-10-03 HISTORY — DX: Stress incontinence (female) (male): N39.3

## 2014-10-03 LAB — GLUCOSE, CAPILLARY
GLUCOSE-CAPILLARY: 129 mg/dL — AB (ref 65–99)
GLUCOSE-CAPILLARY: 112 mg/dL — AB (ref 65–99)

## 2014-10-03 LAB — POCT HEMOGLOBIN-HEMACUE: HEMOGLOBIN: 16.2 g/dL — AB (ref 12.0–15.0)

## 2014-10-03 SURGERY — CREATION, PUBOVAGINAL SLING
Anesthesia: General | Site: Vagina

## 2014-10-03 MED ORDER — FENTANYL CITRATE (PF) 100 MCG/2ML IJ SOLN
25.0000 ug | INTRAMUSCULAR | Status: DC | PRN
  Filled 2014-10-03: qty 1

## 2014-10-03 MED ORDER — FENTANYL CITRATE (PF) 100 MCG/2ML IJ SOLN
INTRAMUSCULAR | Status: AC
  Filled 2014-10-03: qty 6

## 2014-10-03 MED ORDER — BUPIVACAINE-EPINEPHRINE 0.5% -1:200000 IJ SOLN
INTRAMUSCULAR | Status: DC | PRN
  Administered 2014-10-03: 10 mL

## 2014-10-03 MED ORDER — MIDAZOLAM HCL 5 MG/5ML IJ SOLN
INTRAMUSCULAR | Status: DC | PRN
  Administered 2014-10-03: 2 mg via INTRAVENOUS

## 2014-10-03 MED ORDER — BELLADONNA ALKALOIDS-OPIUM 16.2-60 MG RE SUPP
RECTAL | Status: AC
Start: 2014-10-03 — End: 2014-10-03
  Filled 2014-10-03: qty 1

## 2014-10-03 MED ORDER — BELLADONNA ALKALOIDS-OPIUM 16.2-60 MG RE SUPP
RECTAL | Status: DC | PRN
  Administered 2014-10-03: 1 via RECTAL

## 2014-10-03 MED ORDER — FENTANYL CITRATE (PF) 100 MCG/2ML IJ SOLN
INTRAMUSCULAR | Status: DC | PRN
  Administered 2014-10-03: 50 ug via INTRAVENOUS

## 2014-10-03 MED ORDER — SODIUM CHLORIDE 0.9 % IR SOLN
Status: DC | PRN
  Administered 2014-10-03: 500 mL

## 2014-10-03 MED ORDER — MIDAZOLAM HCL 2 MG/2ML IJ SOLN
INTRAMUSCULAR | Status: AC
  Filled 2014-10-03: qty 2

## 2014-10-03 MED ORDER — ONDANSETRON HCL 4 MG/2ML IJ SOLN
INTRAMUSCULAR | Status: DC | PRN
  Administered 2014-10-03: 4 mg via INTRAVENOUS

## 2014-10-03 MED ORDER — PHENYLEPHRINE HCL 10 MG/ML IJ SOLN
INTRAMUSCULAR | Status: DC | PRN
  Administered 2014-10-03: 120 ug via INTRAVENOUS
  Administered 2014-10-03: 100 ug via INTRAVENOUS
  Administered 2014-10-03: 80 ug via INTRAVENOUS
  Administered 2014-10-03 (×2): 120 ug via INTRAVENOUS

## 2014-10-03 MED ORDER — ROCURONIUM BROMIDE 100 MG/10ML IV SOLN
INTRAVENOUS | Status: DC | PRN
  Administered 2014-10-03: 40 mg via INTRAVENOUS

## 2014-10-03 MED ORDER — LACTATED RINGERS IV SOLN
INTRAVENOUS | Status: DC
  Administered 2014-10-03 (×3): via INTRAVENOUS
  Filled 2014-10-03: qty 1000

## 2014-10-03 MED ORDER — ALBUTEROL SULFATE HFA 108 (90 BASE) MCG/ACT IN AERS
INHALATION_SPRAY | RESPIRATORY_TRACT | Status: DC | PRN
  Administered 2014-10-03 (×3): 3 via RESPIRATORY_TRACT

## 2014-10-03 MED ORDER — PROPOFOL 10 MG/ML IV BOLUS
INTRAVENOUS | Status: DC | PRN
  Administered 2014-10-03: 50 mg via INTRAVENOUS
  Administered 2014-10-03: 140 mg via INTRAVENOUS

## 2014-10-03 MED ORDER — NEOSTIGMINE METHYLSULFATE 10 MG/10ML IV SOLN
INTRAVENOUS | Status: DC | PRN
  Administered 2014-10-03: 4 mg via INTRAVENOUS

## 2014-10-03 MED ORDER — CEFAZOLIN SODIUM-DEXTROSE 2-3 GM-% IV SOLR
2.0000 g | INTRAVENOUS | Status: AC
  Administered 2014-10-03: 2 g via INTRAVENOUS
  Filled 2014-10-03: qty 50

## 2014-10-03 MED ORDER — MEPERIDINE HCL 25 MG/ML IJ SOLN
6.2500 mg | INTRAMUSCULAR | Status: DC | PRN
  Filled 2014-10-03: qty 1

## 2014-10-03 MED ORDER — PROMETHAZINE HCL 25 MG/ML IJ SOLN
6.2500 mg | INTRAMUSCULAR | Status: DC | PRN
  Filled 2014-10-03: qty 1

## 2014-10-03 MED ORDER — ACETAMINOPHEN 10 MG/ML IV SOLN
INTRAVENOUS | Status: DC | PRN
Start: 2014-10-03 — End: 2014-10-03
  Administered 2014-10-03: 1000 mg via INTRAVENOUS

## 2014-10-03 MED ORDER — GLYCOPYRROLATE 0.2 MG/ML IJ SOLN
INTRAMUSCULAR | Status: DC | PRN
  Administered 2014-10-03: 0.6 mg via INTRAVENOUS

## 2014-10-03 MED ORDER — TRAMADOL-ACETAMINOPHEN 37.5-325 MG PO TABS: 1.0000 | ORAL_TABLET | Freq: Four times a day (QID) | ORAL | Status: DC | PRN

## 2014-10-03 MED ORDER — LIDOCAINE HCL (CARDIAC) 20 MG/ML IV SOLN
INTRAVENOUS | Status: DC | PRN
  Administered 2014-10-03: 60 mg via INTRAVENOUS

## 2014-10-03 MED ORDER — TRIMETHOPRIM 100 MG PO TABS
100.0000 mg | ORAL_TABLET | ORAL | Status: DC
Start: 2014-10-03 — End: 2014-12-23

## 2014-10-03 MED ORDER — STERILE WATER FOR IRRIGATION IR SOLN
Status: DC | PRN
  Administered 2014-10-03: 3000 mL

## 2014-10-03 MED ORDER — SUCCINYLCHOLINE CHLORIDE 20 MG/ML IJ SOLN
INTRAMUSCULAR | Status: DC | PRN
  Administered 2014-10-03: 80 mg via INTRAVENOUS

## 2014-10-03 SURGICAL SUPPLY — 63 items
BAG URINE DRAINAGE (UROLOGICAL SUPPLIES) ×3 IMPLANT
BLADE CLIPPER SURG (BLADE) ×3 IMPLANT
BLADE SURG 10 STRL SS (BLADE) ×3 IMPLANT
BLADE SURG 15 STRL LF DISP TIS (BLADE) ×1 IMPLANT
BLADE SURG 15 STRL SS (BLADE) ×2
BOOTIES KNEE HIGH SLOAN (MISCELLANEOUS) ×3 IMPLANT
CANISTER SUCTION 1200CC (MISCELLANEOUS) IMPLANT
CANISTER SUCTION 2500CC (MISCELLANEOUS) ×6 IMPLANT
CATH FOLEY 2WAY SLVR  5CC 16FR (CATHETERS) ×2
CATH FOLEY 2WAY SLVR 5CC 16FR (CATHETERS) ×1 IMPLANT
COVER BACK TABLE 60X90IN (DRAPES) ×3 IMPLANT
COVER MAYO STAND STRL (DRAPES) ×3 IMPLANT
DEVICE CAPIO SLIM BOX (INSTRUMENTS) IMPLANT
DISSECTOR ROUND CHERRY 3/8 STR (MISCELLANEOUS) IMPLANT
DRAPE UNDERBUTTOCKS STRL (DRAPE) ×3 IMPLANT
FLOSEAL 10ML (HEMOSTASIS) IMPLANT
GAUZE SPONGE 4X4 16PLY XRAY LF (GAUZE/BANDAGES/DRESSINGS) IMPLANT
GLOVE BIO SURGEON STRL SZ7.5 (GLOVE) ×3 IMPLANT
GLOVE BIOGEL M 6.5 STRL (GLOVE) ×3 IMPLANT
GLOVE BIOGEL PI IND STRL 6.5 (GLOVE) ×1 IMPLANT
GLOVE BIOGEL PI IND STRL 7.5 (GLOVE) ×1 IMPLANT
GLOVE BIOGEL PI INDICATOR 6.5 (GLOVE) ×2
GLOVE BIOGEL PI INDICATOR 7.5 (GLOVE) ×2
GOWN STRL REUS W/ TWL LRG LVL3 (GOWN DISPOSABLE) ×1 IMPLANT
GOWN STRL REUS W/ TWL XL LVL3 (GOWN DISPOSABLE) ×1 IMPLANT
GOWN STRL REUS W/TWL LRG LVL3 (GOWN DISPOSABLE) ×2
GOWN STRL REUS W/TWL XL LVL3 (GOWN DISPOSABLE) ×2
LIQUID BAND (GAUZE/BANDAGES/DRESSINGS) IMPLANT
MANIFOLD NEPTUNE II (INSTRUMENTS) IMPLANT
NEEDLE 1/2 CIR CATGUT .05X1.09 (NEEDLE) IMPLANT
NEEDLE HYPO 22GX1.5 SAFETY (NEEDLE) ×6 IMPLANT
PACKING VAGINAL (PACKING) ×3 IMPLANT
PENCIL BUTTON HOLSTER BLD 10FT (ELECTRODE) ×3 IMPLANT
PLUG CATH AND CAP STER (CATHETERS) ×3 IMPLANT
RETRACTOR LONRSTAR 16.6X16.6CM (MISCELLANEOUS) ×1 IMPLANT
RETRACTOR STAY HOOK 5MM (MISCELLANEOUS) ×3 IMPLANT
RETRACTOR STER APS 16.6X16.6CM (MISCELLANEOUS) ×3
SET IRRIG Y TYPE TUR BLADDER L (SET/KITS/TRAYS/PACK) ×3 IMPLANT
SHEET LAVH (DRAPES) ×3 IMPLANT
SLING ALTIS SYSTEM (Sling) ×3 IMPLANT
SLING SOLYX SYSTEM SIS BX (SLING) IMPLANT
SPONGE LAP 4X18 X RAY DECT (DISPOSABLE) ×3 IMPLANT
SUCTION FRAZIER TIP 10 FR DISP (SUCTIONS) ×3 IMPLANT
SUT ABS MONO DBL WITH NDL 48IN (SUTURE) IMPLANT
SUT ETHILON 2 0 PS N (SUTURE) IMPLANT
SUT MON AB 2-0 SH 27 (SUTURE)
SUT MON AB 2-0 SH27 (SUTURE) IMPLANT
SUT NONABSORB MONO DB W/NDL 48 (SUTURE) IMPLANT
SUT PDS AB 3-0 SH 27 (SUTURE) IMPLANT
SUT VIC AB 0 CT1 36 (SUTURE) IMPLANT
SUT VIC AB 2-0 CT1 27 (SUTURE)
SUT VIC AB 2-0 CT1 TAPERPNT 27 (SUTURE) IMPLANT
SUT VIC AB 2-0 UR6 27 (SUTURE) ×3 IMPLANT
SUT VIC AB 3-0 SH 27 (SUTURE) ×2
SUT VIC AB 3-0 SH 27X BRD (SUTURE) ×1 IMPLANT
SYR BULB IRRIGATION 50ML (SYRINGE) ×3 IMPLANT
SYR CONTROL 10ML LL (SYRINGE) ×3 IMPLANT
SYRINGE 10CC LL (SYRINGE) ×3 IMPLANT
TRAY DSU PREP LF (CUSTOM PROCEDURE TRAY) ×3 IMPLANT
TUBE CONNECTING 12'X1/4 (SUCTIONS) ×2
TUBE CONNECTING 12X1/4 (SUCTIONS) ×4 IMPLANT
WATER STERILE IRR 500ML POUR (IV SOLUTION) ×6 IMPLANT
YANKAUER SUCT BULB TIP NO VENT (SUCTIONS) IMPLANT

## 2014-10-03 NOTE — Anesthesia Postprocedure Evaluation (Signed)
  Anesthesia Post-op Note  Patient: Rita Payne  Procedure(s) Performed: Procedure(s) (LRB): PUBO-VAGINAL SLING (N/A)  Patient Location: PACU  Anesthesia Type: General  Level of Consciousness: awake and alert   Airway and Oxygen Therapy: Patient Spontanous Breathing  Post-op Pain: mild  Post-op Assessment: Post-op Vital signs reviewed, Patient's Cardiovascular Status Stable, Respiratory Function Stable, Patent Airway and No signs of Nausea or vomiting  Last Vitals:  Filed Vitals:   10/03/14 1215  BP: 105/57  Pulse: 102  Temp:   Resp: 19    Post-op Vital Signs: stable   Complications: No apparent anesthesia complications

## 2014-10-03 NOTE — Interval H&P Note (Signed)
History and Physical Interval Note:  10/03/2014 10:15 AM  Rita Payne  has presented today for surgery, with the diagnosis of STRESS URINARY INCONTINENCE  The various methods of treatment have been discussed with the patient and family. After consideration of risks, benefits and other options for treatment, the patient has consented to  Procedure(s): PUBO-VAGINAL SLING (N/A) as a surgical intervention .  The patient's history has been reviewed, patient examined, no change in status, stable for surgery.  I have reviewed the patient's chart and labs.  Questions were answered to the patient's satisfaction.     Kamiryn Bezanson I Necia Kamm

## 2014-10-03 NOTE — Anesthesia Preprocedure Evaluation (Addendum)
Anesthesia Evaluation  Patient identified by MRN, date of birth, ID band Patient awake    Reviewed: Allergy & Precautions, H&P , NPO status , Patient's Chart, lab work & pertinent test results  History of Anesthesia Complications Negative for: history of anesthetic complications  Airway Mallampati: II  TM Distance: >3 FB Neck ROM: Full    Dental  (+) Edentulous Upper, Dental Advisory Given, Chipped,    Pulmonary asthma , neg sleep apnea, Current Smoker,    breath sounds clear to auscultation       Cardiovascular hypertension, Pt. on medications (-) angina(-) Past MI and (-) CHF (-) dysrhythmias (-) Valvular Problems/Murmurs Rhythm:Regular     Neuro/Psych neg Seizures  Neuromuscular disease negative psych ROS   GI/Hepatic Neg liver ROS, GERD  Medicated and Controlled,  Endo/Other  diabetes, Well Controlled, Type 2  Renal/GU negative Renal ROS     Musculoskeletal  (+) Arthritis , Osteoarthritis,    Abdominal   Peds  Hematology negative hematology ROS (+)   Anesthesia Other Findings   Reproductive/Obstetrics                         Anesthesia Physical  Anesthesia Plan  ASA: III  Anesthesia Plan: General   Post-op Pain Management:    Induction: Intravenous  Airway Management Planned: Oral ETT and LMA  Additional Equipment: None  Intra-op Plan:   Post-operative Plan: Extubation in OR  Informed Consent: I have reviewed the patients History and Physical, chart, labs and discussed the procedure including the risks, benefits and alternatives for the proposed anesthesia with the patient or authorized representative who has indicated his/her understanding and acceptance.   Dental advisory given  Plan Discussed with: CRNA and Surgeon  Anesthesia Plan Comments:        Anesthesia Quick Evaluation

## 2014-10-03 NOTE — Discharge Instructions (Addendum)
Urethral Vaginal Sling A urethral vaginal sling procedure is surgery to correct urinary incontinence. Urinary incontinence is uncontrolled loss of urine. It is common in women who have had children and in older women. In this surgery, a strong piece of material is placed under the tube that drains the bladder (urethra). This sling is made of tension-free vaginal tape or nylon mesh. It fits under the urethra like a hammock. The sling is put in position to straighten, support, and hold the urethra in its normal position.  LET [facility] CARE PROVIDER KNOW ABOUT:   Any allergies you have.  All medicines you are taking, including vitamins, herbs, eye drops, creams, and over-the-counter medicines.  Previous problems you or members of your family have had with the use of anesthetics.  Any blood disorders you have.  Previous surgeries you have had.  Medical conditions you have. RISKS AND COMPLICATIONS  Generally, this is a safe procedure. However, as with any procedure, complications can occur. Possible complications include:  Infection.  Excessive bleeding.  Damage to other organs.  Problems urinating properly for several days or weeks.  Problems from the use of anesthetics.  Return of the urinary incontinence. BEFORE THE PROCEDURE   Ask your health care provider about changing or stopping your regular medicines. You may need to stop taking certain medicines 1 week before the surgery.  Do not eat or drink anything for 6-8 hours before the surgery.  If you smoke, do not smoke for at least 2 weeks before the surgery.  Make plans to have someone drive you home after your hospital stay. Also arrange for someone to help you with activities during recovery. PROCEDURE   You will have general or spinal anesthesia. With general anesthesia, you are asleep and will feel no pain. With spinal anesthesia, you are numb from the waist down, but you will still be awake.  A catheter is placed in  your bladder to drain urine during the procedure.  An incision is made in your vagina and low on your belly in the hairline.  The sling material is passed around your bladder neck and sutured to the muscles to hold the urethra in its normal position.  The incisions are closed. AFTER THE PROCEDURE   You will be taken to a recovery area where your progress will be monitored closely. Your breathing, blood pressure, and pulse (vital signs) will be checked often. When you are stable, you will be moved to a regular hospital room.  You will have a catheter in place to drain your bladder. This will stay in place until your bladder is working properly on its own.  Post Anesthesia Home Care Instructions  Activity: Get plenty of rest for the remainder of the day. A responsible adult should stay with you for 24 hours following the procedure.  For the next 24 hours, DO NOT: -Drive a car -Paediatric nurse -Drink alcoholic beverages -Take any medication unless instructed by your physician -Make any legal decisions or sign important papers.  Meals: Start with liquid foods such as gelatin or soup. Progress to regular foods as tolerated. Avoid greasy, spicy, heavy foods. If nausea and/or vomiting occur, drink only clear liquids until the nausea and/or vomiting subsides. Call your physician if vomiting continues.  Special Instructions/Symptoms: Your throat may feel dry or sore from the anesthesia or the breathing tube placed in your throat during surgery. If this causes discomfort, gargle with warm salt water. The discomfort should disappear within 24 hours.  If you  had a scopolamine patch placed behind your ear for the management of post- operative nausea and/or vomiting:  1. The medication in the patch is effective for 72 hours, after which it should be removed.  Wrap patch in a tissue and discard in the trash. Wash hands thoroughly with soap and water. 2. You may remove the patch earlier than 72  hours if you experience unpleasant side effects which may include dry mouth, dizziness or visual disturbances. 3. Avoid touching the patch. Wash your hands with soap and water after contact with the patch.    Document Released: 10/03/2007 Document Revised: 10/14/2012 Document Reviewed: 06/12/2012 [facility] Patient Information 2015 [place], [place]. This information is not intended to replace advice given to you by your health care provider. Make sure you discuss any questions you have with your health care provider. HOME CARE INSTRUCTIONS FOR VAGINAL SLING  Activity:  -No lifting greater than 10-15 pounds for 1 week, or as instructed by your                        physician.             -No sexual intercourse until your f/u visit Diet:  You may return to your normal diet tomorrow.   It is important to keep your       bowels regular during the postoperative period.  To avoid constipation, drink plenty of fluids during the day (8-10 glasses) and eat plenty of fresh fruits and vegetables.  Use a mild laxative or stool softener if necessary.  Wound Care:  You may begin showering tomorrow, or as instructed by your physician.      Return to Work as instructed by your physician.    Special Instructions:   Call your physician if any of these symptoms occur:   -temperature greater than 101 degrees Farenheit.   -redness, swelling or drainage at incision site.   -foul odor of your urine.   -a significant decrease in the amount of urine you have every day.   -severe pain not relieved by your pain medication.    Return to see your doctor .   Call to set up a follow-up appointment.   Patient Signature:  ________________________________________________________  Nurse's Signature:  ________________________________________________________

## 2014-10-03 NOTE — Progress Notes (Signed)
Dr. Gaynelle Arabian called and reported voiding 125 ml urine and bladder scan 413. May in and out cath or patient may just be discharged. Patient refused in and cath.

## 2014-10-03 NOTE — Anesthesia Procedure Notes (Signed)
Procedure Name: Intubation Date/Time: 10/03/2014 10:25 AM Performed by: Wanita Chamberlain Pre-anesthesia Checklist: Patient identified, Emergency Drugs available, Suction available, Patient being monitored and Timeout performed Patient Re-evaluated:Patient Re-evaluated prior to inductionOxygen Delivery Method: Circle system utilized Preoxygenation: Pre-oxygenation with 100% oxygen Intubation Type: IV induction Ventilation: Mask ventilation without difficulty LMA: LMA inserted LMA Size: 4.0 Laryngoscope Size: Mac and 3 Grade View: Grade I Tube type: Oral Tube size: 7.0 mm Number of attempts: 1 Placement Confirmation: ETT inserted through vocal cords under direct vision,  positive ETCO2 and breath sounds checked- equal and bilateral Secured at: 20 cm Tube secured with: Tape Dental Injury: Teeth and Oropharynx as per pre-operative assessment  Comments: LMA inserted initially. Pt w/ abdominal wall breathing, not acceptable for surgical procedure so converted to ETT w/o difficulty.

## 2014-10-03 NOTE — Op Note (Signed)
Pre-operative diagnosis :   Stress urinary incontinence  Postoperative diagnosis: Same   Operation:  Altis single incision mid urethral sling  Surgeon:  S. Gaynelle Arabian, MD  First assistant: None   Anesthesia:  General LMA   Preparation: After appropriate preanesthesia, the patient was brought the operating room, placed on the operative table in the dorsal supine position where general LMA anesthesia was introduced. She was replaced the dorsal lithotomy position with the pubis was prepped with Betadine solution and draped in usual fashion. The history was reviewed. The armband was double checked.     Review history:  Problems  1. Chronic cystitis (N30.20) 2. Female stress incontinence (N39.3) 3. Urethral caruncle (N36.2)  History of Present Illness 69 year old diabetic female returns today for a cystoscopy & to review urodynamic results. Hx of recurrent urinary tract infections over the last 10 years. The patient had bladder surgery in [place] 20 years ago. She has had 7 urinary tract infections since October 2015. She is post hysterectomy, hip replacement, and lumbar decompression. She complains of cough incontinence beginning 10 months ago. She is allergic to iodine and shellfish.    Physical examination: She has a BMI of 32. On pelvic examination, the genitalia is exam shows vulvar atrophy. There is a urethral carbuncle present. There is vaginal atrophy, with tenderness in the vaginal epithelium. There is poor estrogenization. There is no cystocele, no enterocele, no rectocele. The uterus is absent.   Statement of  Likelihood of Success: Excellent. TIME-OUT observed.:  Procedure:  Foley catheter was placed. The [facility] retractor was placed. 10 mL of Marcaine with epinephrine 1-20,000 was injected over the mid urethral epithelial tissue, and in the periurethral space, after marking the mid urethra, as well as the bladder neck.  A 2 cm mid urethral vaginal epithelium incision  was made, and subcutaneous tissue was dissected with the Strully scissors. Tissue dissection was accomplished to the pubis laterally on the right and the left sides. This allowed the index finger dissection bilaterally. No bleeding was noted.  The Altis sling was then placed through the right. He urethral incision, behind the pubis, and the handle turned appropriately. The sling was released after popping through the obturator fossa, and is withdrawn. The left side of the sling is then placed in similar manner, through the left periurethral dissected area under the pubis, and through the obturator foramen.  The Foley catheter is removed, and flexible cystoscopy was accomplished, with identification of jets of urine coming from the ureteral orifices bilaterally. With 300 mL in the bladder, urine is identified to come from the urethra out, after the cystoscope was removed. This allowed for tensioning of the sling. Cred maneuver was also used to tension the sling. The sling was noted to be smooth and flat against the urethra. A right angle clamp was also used to ensure that the sling was not too tight against the urethra, and that the sling was smooth against urethra.  The tensioning suture was cut proximal ward.  The wound was closed in 2 layers, with 20 Vicryls running suture, and 30 Vicryls running suture through the vaginal epithelium.  The patient was awakened and taken to recovery room in good condition. No Foley catheter was placed, and no vaginal packing was placed.

## 2014-10-03 NOTE — Interval H&P Note (Signed)
History and Physical Interval Note:  10/03/2014 10:15 AM  Rita Payne  has presented today for surgery, with the diagnosis of STRESS URINARY INCONTINENCE  The various methods of treatment have been discussed with the patient and family. After consideration of risks, benefits and other options for treatment, the patient has consented to  Procedure(s): PUBO-VAGINAL SLING (N/A) as a surgical intervention .  The patient's history has been reviewed, patient examined, no change in status, stable for surgery.  I have reviewed the patient's chart and labs.  Questions were answered to the patient's satisfaction.     Archie Atilano I Talin Feister

## 2014-10-03 NOTE — Transfer of Care (Signed)
Immediate Anesthesia Transfer of Care Note  Patient: Rita Payne  Procedure(s) Performed: Procedure(s): PUBO-VAGINAL SLING (N/A)  Patient Location: PACU  Anesthesia Type:General  Level of Consciousness: awake, alert , oriented and patient cooperative  Airway & Oxygen Therapy: Patient Spontanous Breathing and Patient connected to nasal cannula oxygen  Post-op Assessment: Report given to RN and Post -op Vital signs reviewed and stable  Post vital signs: Reviewed and stable  Last Vitals:  Filed Vitals:   10/03/14 0857  BP: 107/60  Pulse: 111  Temp: 36.8 C  Resp: 18    Complications: No apparent anesthesia complications

## 2014-10-03 NOTE — H&P (Signed)
Reason For Visit Cystoscopy & review urodynamic results   Active Problems Problems  1. Chronic cystitis (N30.20) 2. Female stress incontinence (N39.3) 3. Urethral caruncle (N36.2)  History of Present Illness 69 year old diabetic female returns today for a cystoscopy & to review urodynamic results. Hx of recurrent urinary tract infections over the last 10 years. The patient had bladder surgery in [place] 20 years ago. She has had 7 urinary tract infections since October 2015. She is post hysterectomy, hip replacement, and lumbar decompression. She complains of cough incontinence beginning 10 months ago. She is allergic to iodine and shellfish.    Physical examination: She has a BMI of 32. On pelvic examination, the genitalia is exam shows vulvar atrophy. There is a urethral carbuncle present. There is vaginal atrophy, with tenderness in the vaginal epithelium. There is poor estrogenization. There is no cystocele, no enterocele, no rectocele. The uterus is absent.    Urodynamics on 08/09/14, shows a first sensation at 26 cc (probably catheter-related). Strong desire occurred at 259 cc. The bladder becomes unstable, with a contraction occurring at 247 cc. The maximum unstable bladder contraction pressure is only 6 cm of water, and no urinary leakage occurs.    Valsalva leak point pressure: The cough leak point pressure of 200 cc is 96 7 m water, with mild urinary leakage. There was no leakage noted with Valsalva.    Pressure flow study: The patient does demonstrated voluntary contraction, with voided volume of 1 70 cc. The maximum flow rate is 4 cc/s without straining, and 7 cc/s with straining. The maximum detrusor pressure is 12 cm of water, and PVR residual is 130 cc.    Mrs. Shuart has a maximum capacity 300 cc with positive stress urinary incontinence and low amplitude instability. She felt urgency but was able to inhibit the weekend. She was able to be comes to void without  straining. She did generate some voluntary contractions without straining but they were weak, and not well sustained. She left 130 cc behind. No reflux was noted.   Past Medical History Problems  1. History of arthritis (Z87.39) 2. History of asthma (Z87.09) 3. History of diabetes mellitus (Z86.39) 4. History of esophageal reflux (Z87.19) 5. History of hypercholesterolemia (Z86.39) 6. History of hypertension (Z86.79) 7. History of renal calculi (Z87.442) 8. History of Obstructive sleep apnea, adult (G47.33)  Surgical History Problems  1. History of Arthroscopy Knee 2. History of Cholecystectomy 3. History of Hernia Repair 4. History of Hip Arthroscopy 5. History of Integumentary Procedures Blepharoplasty 6. History of Kidney Surgery 7. History of Laminectomy Lumbar 8. History of Tonsillectomy 9. History of Total Abdominal Hysterectomy  Current Meds 1. Advair Diskus 250-50 MCG/DOSE MISC;  Therapy: (Recorded:19Jul2016) to Recorded 2. Aspirin 81 MG TABS;  Therapy: (Recorded:20Jun2016) to Recorded 3. CeleBREX 200 MG Oral Capsule;  Therapy: (Recorded:20Jun2016) to Recorded 4. Cetirizine HCl - 10 MG Oral Tablet;  Therapy: (Recorded:20Jun2016) to Recorded 5. Crestor 40 MG Oral Tablet;  Therapy: (Recorded:19Jul2016) to Recorded 6. MetFORMIN HCl - 500 MG Oral Tablet;  Therapy: (Recorded:20Jun2016) to Recorded 7. Micardis HCT 80-25 MG Oral Tablet;  Therapy: (Recorded:20Jun2016) to Recorded 8. Pantoprazole Sodium 40 MG Oral Tablet Delayed Release;  Therapy: (Recorded:20Jun2016) to Recorded 9. ProAir HFA AERS;  Therapy: (Recorded:20Jul2016) to Recorded 10. Trimethoprim 100 MG Oral Tablet; Take 1 tablet daily;   Therapy: 20Jul2016 to (Evaluate:15Jul2017)  Requested for: 20Jul2016; Last   Rx:20Jul2016 Ordered 11. Vitamin D3 1000 UNIT Oral Tablet;   Therapy: (Recorded:20Jun2016) to Recorded  Allergies Medication  1. Vasotec TABS 2. Iodine SOLN 3. Pyridium TABS Non-Medication   4. Shellfish 5. Adhesive Tape 6. Contrast Dye  Family History Problems  1. Family history of Deceased : Mother, Father 2. Family history of cardiac disorder (Z82.49) : Mother, Father, Brother 3. Family history of diabetes mellitus (Z83.3) : Mother, Sister 4. Family history of rheumatoid arthritis (Z82.61) : Sister, Brother 5. Family history of stroke (Z82.3) : Mother, Father, Sister  Social History Problems  1. Caffeine use (F15.90)   3-4 per day 2. Current every day smoker (F17.200) 3. No alcohol use 4. Number of children   1 son, 1 daughter 5. Retired   Radio broadcast assistant 6. Smokes cigarettes (F17.210)   1/2 ppd x 19yrs 7. Widowed  Review of Systems Genitourinary, constitutional, skin, eye, otolaryngeal, hematologic/lymphatic, cardiovascular, pulmonary, endocrine, musculoskeletal, gastrointestinal, neurological and psychiatric system(s) were reviewed and pertinent findings if present are noted and are otherwise negative.  Genitourinary: dysuria.  Gastrointestinal: heartburn and diarrhea.  Musculoskeletal: joint pain.    Vitals Vital Signs [Data Includes: Last 1 Day]  Recorded: 26Aug2016 10:03AM  Blood Pressure: 131 / 82 Temperature: 97.5 F Heart Rate: 96  Physical Exam Constitutional: Well nourished and well developed . No acute distress. The patient appears well hydrated.  ENT:. The ears and nose are normal in appearance.  Neck: The appearance of the neck is normal and no neck mass is present.  Pulmonary: No respiratory distress and normal respiratory rhythm and effort.  Cardiovascular: Heart rate and rhythm are normal.  Abdomen: The abdomen is mildly obese, but not distended. The abdomen is soft and nontender. No masses are palpated. No CVA tenderness. Bowel sounds are normal. No hernias are palpable. No hepatosplenomegaly noted.  Genitourinary:  Chaperone Present: kim lewis.  Examination of the external genitalia shows vulvar atrophy. The urethra is is stenotic, but  not tender and no urethral caruncle. Urethral hypermobility is not present. There is no urethral discharge. No periurethral cyst is identified. There is no urethral prolapse. Vaginal exam demonstrates atrophy, tenderness and the vaginal epithelium to be poorly estrogenized, but no discharge. No cystocele is identified. No enterocele is identified. No rectocele is identified. The cervix is is absent. There is no cervical discharge. The uterus is absent. The bladder is normal on palpation, non tender, not distended and without masses. The anus is normal on inspection. The perineum is normal on inspection.  Lymphatics: The femoral and inguinal nodes are not enlarged or tender.  Skin: Normal skin turgor, no visible rash and no visible skin lesions.  Neuro/Psych:. Mood and affect are appropriate.    Results/Data Urine [Data Includes: Last 1 Day]   26Aug2016  COLOR YELLOW   APPEARANCE CLEAR   SPECIFIC GRAVITY 1.020   pH 6.0   GLUCOSE NEGATIVE   BILIRUBIN NEGATIVE   KETONE NEGATIVE   BLOOD NEGATIVE   PROTEIN NEGATIVE   NITRITE NEGATIVE   LEUKOCYTE ESTERASE TRACE   SQUAMOUS EPITHELIAL/HPF 6-10 HPF  WBC 0-5 WBC/HPF  RBC NONE SEEN RBC/HPF  BACTERIA FEW HPF  CRYSTALS NONE SEEN HPF  CASTS NONE SEEN LPF  Yeast NONE SEEN HPF   Procedure  Procedure: Cystoscopy  Chaperone Present: kim Bobby Rumpf.  Indication: Lower Urinary Tract Symptoms. Frequent UTI.  Informed Consent: Risks, benefits, and potential adverse events were discussed and informed consent was obtained from the patient.  Prep: The patient was prepped with betadine.  Anesthesia:. Local anesthesia was administered intraurethrally with 2% lidocaine jelly.  Antibiotic prophylaxis: Ciprofloxacin.  Procedure Note:  Urethral meatus:Marland Kitchen Meatal  stenosis.  Anterior urethra: No abnormalities.  Prostatic urethra: No abnormalities.  Bladder: The ureteral orifices were in the normal anatomic position bilaterally. A systematic survey of the bladder  demonstrated no bladder tumors or stones. Examination of the bladder demonstrated no clot within the bladder, no trabeculation and no diverticulum no erythematous mucosa, no ulcer, no edema and no cellules. The patient tolerated the procedure well.  Complications: None.    Assessment Assessed  1. Chronic cystitis (N30.20) 2. Atrophic vaginitis (N95.2) 3. Female stress incontinence (N39.3)  1. Vaginal dryness: Discussed Luvena. Also discussed referral to Dr. Helane Rima for vaginal laser therapy ( cost, insurance does not cover cost, but very good results).   2. Vaginal hormone discussion: Discussed urethra relationship to estrogen, and post menopausal "tank" being "empty", allowing the urethral lining cells to flatten and to not put out any immune response to bombarding bacteria to kill them, allowing bacteria to enter bladder, leading to more bladder infections. She will use vaginal hormone to replenish vaginal estrogen, recognizing that it will take 1-2 years to accomplish our goal. We have discussed alternatives for hormones, and the fact that she has had hysterectomy, and she would like vaginal pill to use 2x/week.   3. Pt wants to proceed with trans vaginal sling. for cough, laugh, sneeze leaking. Leaks with exercise ( sit-ups). She will not be able to cycle for 3 weeks post op.   4. Altis sling surgery will be better approach in order to stay away from prior bladder and ureteral injuries.   Plan Atrophic vaginitis  1. Renew: Vagifem 10 MCG Vaginal Tablet; Insert one tablet vaginally twice a week Chronic cystitis  2. Renew: Trimethoprim 100 MG Oral Tablet; Take 1 tablet daily Health Maintenance  3. UA With REFLEX; [Do Not Release]; Status:Resulted - Requires Verification;   Done:  [id number] 09:40AM  1. Luvena samples  2. Order vaginal hormone.   3. trimethoprim 1/day. for the next 6 months.   4. Urogesic Blue trial to see if she blisters ( note allergy to Benadryl)   5. Possible MRI  to look at ureters, since no one has seen anatomic location of ureters since her surgery ( 1987).  6. Altis sling surgery.   Discussion/Summary cc: Claretta Fraise, MD. Reader     Signatures Electronically signed by : Carolan Clines, M.D.; Sep 02 2014 12:10PM EST

## 2014-10-04 ENCOUNTER — Encounter (HOSPITAL_BASED_OUTPATIENT_CLINIC_OR_DEPARTMENT_OTHER): Payer: Self-pay | Admitting: Urology

## 2014-10-11 DIAGNOSIS — N393 Stress incontinence (female) (male): Secondary | ICD-10-CM | POA: Diagnosis not present

## 2014-11-09 ENCOUNTER — Other Ambulatory Visit: Payer: Self-pay | Admitting: Nurse Practitioner

## 2014-11-12 ENCOUNTER — Other Ambulatory Visit: Payer: Self-pay | Admitting: Nurse Practitioner

## 2014-11-15 DIAGNOSIS — N3000 Acute cystitis without hematuria: Secondary | ICD-10-CM | POA: Diagnosis not present

## 2014-11-15 DIAGNOSIS — N393 Stress incontinence (female) (male): Secondary | ICD-10-CM | POA: Diagnosis not present

## 2014-12-06 DIAGNOSIS — N3020 Other chronic cystitis without hematuria: Secondary | ICD-10-CM | POA: Diagnosis not present

## 2014-12-08 ENCOUNTER — Other Ambulatory Visit: Payer: Self-pay | Admitting: Family Medicine

## 2014-12-23 ENCOUNTER — Encounter: Payer: Self-pay | Admitting: Nurse Practitioner

## 2014-12-23 ENCOUNTER — Ambulatory Visit (INDEPENDENT_AMBULATORY_CARE_PROVIDER_SITE_OTHER): Payer: Medicare Other | Admitting: Nurse Practitioner

## 2014-12-23 VITALS — BP 152/80 | HR 74 | Temp 96.8°F | Ht 64.0 in | Wt 202.0 lb

## 2014-12-23 DIAGNOSIS — Z6835 Body mass index (BMI) 35.0-35.9, adult: Secondary | ICD-10-CM

## 2014-12-23 DIAGNOSIS — J302 Other seasonal allergic rhinitis: Secondary | ICD-10-CM | POA: Diagnosis not present

## 2014-12-23 DIAGNOSIS — Z23 Encounter for immunization: Secondary | ICD-10-CM

## 2014-12-23 DIAGNOSIS — E785 Hyperlipidemia, unspecified: Secondary | ICD-10-CM

## 2014-12-23 DIAGNOSIS — J4520 Mild intermittent asthma, uncomplicated: Secondary | ICD-10-CM | POA: Diagnosis not present

## 2014-12-23 DIAGNOSIS — I10 Essential (primary) hypertension: Secondary | ICD-10-CM | POA: Diagnosis not present

## 2014-12-23 DIAGNOSIS — E119 Type 2 diabetes mellitus without complications: Secondary | ICD-10-CM

## 2014-12-23 DIAGNOSIS — K219 Gastro-esophageal reflux disease without esophagitis: Secondary | ICD-10-CM

## 2014-12-23 LAB — POCT GLYCOSYLATED HEMOGLOBIN (HGB A1C): Hemoglobin A1C: 6.7

## 2014-12-23 MED ORDER — ROSUVASTATIN CALCIUM 40 MG PO TABS: ORAL_TABLET | ORAL | Status: DC

## 2014-12-23 MED ORDER — CETIRIZINE HCL 10 MG PO TABS: 10.0000 mg | ORAL_TABLET | Freq: Every day | ORAL | Status: DC

## 2014-12-23 MED ORDER — PANTOPRAZOLE SODIUM 40 MG PO TBEC: 40.0000 mg | DELAYED_RELEASE_TABLET | Freq: Every day | ORAL | Status: DC

## 2014-12-23 MED ORDER — TELMISARTAN-HCTZ 80-25 MG PO TABS: 1.0000 | ORAL_TABLET | Freq: Every morning | ORAL | Status: DC

## 2014-12-23 MED ORDER — FLUTICASONE-SALMETEROL 250-50 MCG/DOSE IN AEPB: 1.0000 | INHALATION_SPRAY | Freq: Two times a day (BID) | RESPIRATORY_TRACT | Status: DC

## 2014-12-23 MED ORDER — METFORMIN HCL 500 MG PO TABS: 500.0000 mg | ORAL_TABLET | Freq: Two times a day (BID) | ORAL | Status: DC

## 2014-12-23 NOTE — Progress Notes (Signed)
Subjective:    Patient ID: Rita Payne, female    DOB: 1945/03/08, 69 y.o.   MRN: [id number]   Patient here today for follow up of chronic medical problems. Joined gym and has been exercising 3 days a week. Wants referral to dermatology for skin lesions   Hypertension This is a chronic problem. The current episode started more than 1 year ago. The problem is unchanged. The problem is controlled. Pertinent negatives include no headaches, palpitations or shortness of breath. Risk factors for coronary artery disease include dyslipidemia, post-menopausal state and sedentary lifestyle. Past treatments include ACE inhibitors and diuretics. The current treatment provides moderate improvement. Compliance problems include diet and exercise.  There is no history of CAD/MI or CVA.  Hyperlipidemia This is a chronic problem. The current episode started more than 1 year ago. Recent lipid tests were reviewed and are variable. She has no history of diabetes, hypothyroidism or obesity. Pertinent negatives include no shortness of breath. Current antihyperlipidemic treatment includes statins. The current treatment provides moderate improvement of lipids. Compliance problems include adherence to diet and adherence to exercise.  Risk factors for coronary artery disease include dyslipidemia, hypertension and post-menopausal.  GERD Protonix working better than zantac was- symptoms under good control. Diabetes Patient said since last visit her blood sugars have improvedShe was put on metformin at last visit and that has seemed to help. Asthma Uses advair daily- only uses albuterol maybe 2x a month. Patient does smoke about 6 cigarettes a day.     Review of Systems  Constitutional: Negative.   HENT: Negative.   Respiratory: Negative.  Negative for shortness of breath.   Cardiovascular: Negative.  Negative for palpitations.  Genitourinary: Negative.   Neurological: Negative.  Negative for headaches.    Psychiatric/Behavioral: Negative.   All other systems reviewed and are negative.      Objective:   Physical Exam  Constitutional: She is oriented to person, place, and time. She appears well-developed and well-nourished.  HENT:  Nose: Nose normal.  Mouth/Throat: Oropharynx is clear and moist.  Eyes: EOM are normal.  Neck: Trachea normal, normal range of motion and full passive range of motion without pain. Neck supple. No JVD present. Carotid bruit is not present. No thyromegaly present.  Cardiovascular: Normal rate, regular rhythm, normal heart sounds and intact distal pulses.  Exam reveals no gallop and no friction rub.   No murmur heard. Pulmonary/Chest: Effort normal. She has wheezes (faint exp wheezes in bil bases).  Abdominal: Soft. Bowel sounds are normal. She exhibits no distension and no mass. There is no tenderness.  Musculoskeletal: Normal range of motion.  Lymphadenopathy:    She has no cervical adenopathy.  Neurological: She is alert and oriented to person, place, and time. She has normal reflexes.  Skin: Skin is warm and dry.  Multiple skin lesions on chest wall  Psychiatric: She has a normal mood and affect. Her behavior is normal. Judgment and thought content normal.     BP 152/80 mmHg  Pulse 74  Temp(Src) 96.8 F (36 C) (Oral)  Ht _0  (1.626 m)  Wt 202 lb (91.627 kg)  BMI 34.66 kg/m2  LMP 04/14/1982  Results for orders placed or performed in visit on 12/23/14  POCT glycosylated hemoglobin (Hb A1C)  Result Value Ref Range   Hemoglobin A1C 6.7            Assessment & Plan:  1. Essential hypertension Do nt add salt to diet - CMP14+EGFR  2. Type 2 diabetes  mellitus without complication Continue to exercise and carb count - POCT glycosylated hemoglobin (Hb A1C)  3. Hyperlipidemia with target LDL less than 100 Low fat diet - Lipid panel  4. Asthma, chronic, mild intermittent, uncomplicated  5. Senile osteoporosis  6. Obese Discussed diet  and exercise for person with BMI >25 Will recheck weight in 3-6 months   7. Smoker Smoking cessation encouraged  8. BMI 35.0-35.9,adult Discussed diet and exercise for person with BMI >25 Will recheck weight in 3-6 months    Labs pending Health maintenance reviewed Diet and exercise encouraged Continue all meds Follow up  In 3 months    [place], FNP

## 2014-12-23 NOTE — Patient Instructions (Signed)

## 2014-12-24 LAB — LIPID PANEL
CHOL/HDL RATIO: 4.3 ratio (ref 0.0–4.4)
Cholesterol, Total: 146 mg/dL (ref 100–199)
HDL: 34 mg/dL — ABNORMAL LOW (ref >39)
LDL CALC: 70 mg/dL (ref 0–99)
Triglycerides: 210 mg/dL — ABNORMAL HIGH (ref 0–149)
VLDL CHOLESTEROL CAL: 42 mg/dL — AB (ref 5–40)

## 2014-12-24 LAB — CMP14+EGFR
ALBUMIN: 4.2 g/dL (ref 3.6–4.8)
ALT: 35 IU/L — ABNORMAL HIGH (ref 0–32)
AST: 28 IU/L (ref 0–40)
Albumin/Globulin Ratio: 1.8 (ref 1.1–2.5)
Alkaline Phosphatase: 69 IU/L (ref 39–117)
BILIRUBIN TOTAL: 0.4 mg/dL (ref 0.0–1.2)
BUN / CREAT RATIO: 18 (ref 11–26)
BUN: 13 mg/dL (ref 8–27)
CHLORIDE: 101 mmol/L (ref 96–106)
CO2: 24 mmol/L (ref 18–29)
Calcium: 9.2 mg/dL (ref 8.7–10.3)
Creatinine, Ser: 0.73 mg/dL (ref 0.57–1.00)
GFR calc non Af Amer: 85 mL/min/{1.73_m2} (ref >59)
GFR, EST AFRICAN AMERICAN: 99 mL/min/{1.73_m2} (ref >59)
GLUCOSE: 133 mg/dL — AB (ref 65–99)
Globulin, Total: 2.3 g/dL (ref 1.5–4.5)
Potassium: 4.5 mmol/L (ref 3.5–5.2)
Sodium: 139 mmol/L (ref 134–144)
TOTAL PROTEIN: 6.5 g/dL (ref 6.0–8.5)

## 2014-12-29 ENCOUNTER — Ambulatory Visit: Payer: Medicare Other | Admitting: Pharmacist

## 2015-01-03 DIAGNOSIS — C44219 Basal cell carcinoma of skin of left ear and external auricular canal: Secondary | ICD-10-CM | POA: Diagnosis not present

## 2015-01-03 DIAGNOSIS — L920 Granuloma annulare: Secondary | ICD-10-CM | POA: Diagnosis not present

## 2015-01-03 DIAGNOSIS — D485 Neoplasm of uncertain behavior of skin: Secondary | ICD-10-CM | POA: Diagnosis not present

## 2015-01-03 DIAGNOSIS — L570 Actinic keratosis: Secondary | ICD-10-CM | POA: Diagnosis not present

## 2015-01-03 DIAGNOSIS — D225 Melanocytic nevi of trunk: Secondary | ICD-10-CM | POA: Diagnosis not present

## 2015-01-26 DIAGNOSIS — L089 Local infection of the skin and subcutaneous tissue, unspecified: Secondary | ICD-10-CM | POA: Diagnosis not present

## 2015-01-26 DIAGNOSIS — D485 Neoplasm of uncertain behavior of skin: Secondary | ICD-10-CM | POA: Diagnosis not present

## 2015-02-09 DIAGNOSIS — C4441 Basal cell carcinoma of skin of scalp and neck: Secondary | ICD-10-CM | POA: Diagnosis not present

## 2015-02-09 DIAGNOSIS — C44219 Basal cell carcinoma of skin of left ear and external auricular canal: Secondary | ICD-10-CM | POA: Diagnosis not present

## 2015-02-13 DIAGNOSIS — Z029 Encounter for administrative examinations, unspecified: Secondary | ICD-10-CM | POA: Diagnosis not present

## 2015-02-21 DIAGNOSIS — Z4802 Encounter for removal of sutures: Secondary | ICD-10-CM | POA: Diagnosis not present

## 2015-03-07 ENCOUNTER — Other Ambulatory Visit: Payer: Self-pay | Admitting: Nurse Practitioner

## 2015-03-14 ENCOUNTER — Encounter: Payer: Self-pay | Admitting: Family

## 2015-03-14 ENCOUNTER — Ambulatory Visit (INDEPENDENT_AMBULATORY_CARE_PROVIDER_SITE_OTHER): Payer: Medicare Other | Admitting: Family

## 2015-03-14 VITALS — BP 136/80 | HR 97 | Temp 96.8°F | Ht 64.0 in | Wt 196.8 lb

## 2015-03-14 DIAGNOSIS — S39011A Strain of muscle, fascia and tendon of abdomen, initial encounter: Secondary | ICD-10-CM | POA: Diagnosis not present

## 2015-03-14 NOTE — Patient Instructions (Signed)

## 2015-03-14 NOTE — Progress Notes (Signed)
   Subjective:    Patient ID: Rita Payne, female    DOB: 01-12-1945, 70 y.o.   MRN: [id number]  HPI PT presents to the office today with abdominal pain. Pt states she had "gallbladder surgery" in 1986 and then in 1996 she "ruputured were the scar was and had to have a hernia repair". Pt states that Friday evening she went to the gym and worked out on the treadmill and did a machine that "works your abs". Pt then reports on Friday evening she went to the grocery store with her sister and picked up a "case of drinks". Pt states since then  She has been having abdominal pain in the center to RUQ pain. PT states the pain is intermittent "cramping stinging" 3 out 10. Pt states she has taken tylenol with moderate relief.    Review of Systems  Constitutional: Negative.   HENT: Negative.   Eyes: Negative.   Respiratory: Negative.  Negative for shortness of breath.   Cardiovascular: Negative.  Negative for palpitations.  Gastrointestinal: Negative.   Endocrine: Negative.   Genitourinary: Negative.   Musculoskeletal: Negative.   Neurological: Negative.  Negative for headaches.  Hematological: Negative.   Psychiatric/Behavioral: Negative.   All other systems reviewed and are negative.      Objective:   Physical Exam  Constitutional: She is oriented to person, place, and time. She appears well-developed and well-nourished. No distress.  HENT:  Head: Normocephalic and atraumatic.  Eyes: Pupils are equal, round, and reactive to light.  Neck: Normal range of motion. Neck supple. No thyromegaly present.  Cardiovascular: Normal rate, regular rhythm, normal heart sounds and intact distal pulses.   No murmur heard. Pulmonary/Chest: Effort normal and breath sounds normal. No respiratory distress. She has no wheezes.  Abdominal: Soft. Bowel sounds are normal. She exhibits no distension. There is tenderness (mild epigastric pain).  Musculoskeletal: Normal range of motion. She exhibits no edema or  tenderness.  Neurological: She is alert and oriented to person, place, and time.  Skin: Skin is warm and dry.  Psychiatric: She has a normal mood and affect. Her behavior is normal. Judgment and thought content normal.  Vitals reviewed.   BP 136/80 mmHg  Pulse 97  Temp(Src) 96.8 F (36 C) (Oral)  Ht 5\' 4"  (1.626 m)  Wt 196 lb 12.8 oz (89.268 kg)  BMI 33.76 kg/m2  LMP 04/14/1982       Assessment & Plan:  1. Abdominal muscle strain, initial encounter Rest- told pt to avoid "ab work out" at gym for next 1-2 weeks Ice Continue Celebrex and tylenol as needed RTO in 2 weeks if not improved  Evelina [place], FNP

## 2015-03-23 ENCOUNTER — Encounter: Payer: Self-pay | Admitting: Nurse Practitioner

## 2015-03-23 ENCOUNTER — Ambulatory Visit (INDEPENDENT_AMBULATORY_CARE_PROVIDER_SITE_OTHER): Payer: Medicare Other | Admitting: Nurse Practitioner

## 2015-03-23 VITALS — BP 123/77 | HR 92 | Temp 96.8°F | Ht 64.0 in | Wt 196.0 lb

## 2015-03-23 DIAGNOSIS — Z1212 Encounter for screening for malignant neoplasm of rectum: Secondary | ICD-10-CM

## 2015-03-23 DIAGNOSIS — I10 Essential (primary) hypertension: Secondary | ICD-10-CM

## 2015-03-23 DIAGNOSIS — E785 Hyperlipidemia, unspecified: Secondary | ICD-10-CM | POA: Diagnosis not present

## 2015-03-23 DIAGNOSIS — M810 Age-related osteoporosis without current pathological fracture: Secondary | ICD-10-CM | POA: Diagnosis not present

## 2015-03-23 DIAGNOSIS — Z1159 Encounter for screening for other viral diseases: Secondary | ICD-10-CM

## 2015-03-23 DIAGNOSIS — Z6833 Body mass index (BMI) 33.0-33.9, adult: Secondary | ICD-10-CM

## 2015-03-23 DIAGNOSIS — E119 Type 2 diabetes mellitus without complications: Secondary | ICD-10-CM

## 2015-03-23 DIAGNOSIS — Z23 Encounter for immunization: Secondary | ICD-10-CM | POA: Diagnosis not present

## 2015-03-23 DIAGNOSIS — K219 Gastro-esophageal reflux disease without esophagitis: Secondary | ICD-10-CM | POA: Diagnosis not present

## 2015-03-23 DIAGNOSIS — F17200 Nicotine dependence, unspecified, uncomplicated: Secondary | ICD-10-CM

## 2015-03-23 DIAGNOSIS — Z720 Tobacco use: Secondary | ICD-10-CM | POA: Diagnosis not present

## 2015-03-23 LAB — BAYER DCA HB A1C WAIVED: HB A1C (BAYER DCA - WAIVED): 7 % — ABNORMAL HIGH (ref <7.0)

## 2015-03-23 NOTE — Progress Notes (Signed)
Subjective:    Patient ID: Rita Payne, female    DOB: 1945/07/03, 70 y.o.   MRN: [id number]   Patient here today for follow up of chronic medical problems.  Outpatient Encounter Prescriptions as of 03/23/2015  Medication Sig  . aspirin 81 MG tablet Take 1 tablet (81 mg total) by mouth daily. Resume 4 days post-op  . celecoxib (CELEBREX) 200 MG capsule TAKE 1 CAPSULE EVERY 12 HOURS  . cetirizine (ZYRTEC) 10 MG tablet Take 1 tablet (10 mg total) by mouth daily.  . cholecalciferol (VITAMIN D) 1000 UNITS tablet Take 1,000 Units by mouth daily.  . Fluticasone-Salmeterol (ADVAIR) 250-50 MCG/DOSE AEPB Inhale 1 puff into the lungs every 12 (twelve) hours.  Marland Kitchen FREESTYLE LITE Payne strip   . metFORMIN (GLUCOPHAGE) 500 MG tablet Take 1 tablet (500 mg total) by mouth 2 (two) times daily with a meal.  . pantoprazole (PROTONIX) 40 MG tablet Take 1 tablet (40 mg total) by mouth daily.  Marland Kitchen PROAIR HFA 108 (90 BASE) MCG/ACT inhaler USE 2 INHALATIONS INTO THE LUNGS EVERY 6 HOURS AS NEEDED FOR WHEEZING  . rosuvastatin (CRESTOR) 40 MG tablet TAKE 1 TABLET AT BEDTIME  . telmisartan-hydrochlorothiazide (MICARDIS HCT) 80-25 MG tablet Take 1 tablet by mouth every morning.  Marland Kitchen VAGIFEM 10 MCG TABS vaginal tablet          Hypertension This is a chronic problem. The current episode started more than 1 year ago. The problem is unchanged. The problem is controlled. Pertinent negatives include no headaches, palpitations or shortness of breath. Risk factors for coronary artery disease include dyslipidemia, post-menopausal state and sedentary lifestyle. Past treatments include ACE inhibitors and diuretics. The current treatment provides moderate improvement. Compliance problems include diet and exercise.  There is no history of CAD/MI or CVA.  Hyperlipidemia This is a chronic problem. The current episode started more than 1 year ago. Recent lipid tests were reviewed and are variable. She has no history of diabetes,  hypothyroidism or obesity. Pertinent negatives include no shortness of breath. Current antihyperlipidemic treatment includes statins. The current treatment provides moderate improvement of lipids. Compliance problems include adherence to diet and adherence to exercise.  Risk factors for coronary artery disease include dyslipidemia, hypertension and post-menopausal.  GERD Protonix working better than zantac was- symptoms under good control. Diabetes Patient said since last visit her blood sugars have improvedShe was put on metformin at last visit and that has seemed to help. Asthma Uses advair daily- only uses albuterol maybe 2x a month. Patient does smoke about 6 cigarettes a day. osteoporosis Currently on no meds- no weight bearing exercises arthritis Taking celebrex daily- helps keep pain to a minium     Review of Systems  Constitutional: Negative.   HENT: Negative.   Respiratory: Negative.  Negative for shortness of breath.   Cardiovascular: Negative.  Negative for palpitations.  Genitourinary: Negative.   Neurological: Negative.  Negative for headaches.  Psychiatric/Behavioral: Negative.   All other systems reviewed and are negative.      Objective:   Physical Exam  Constitutional: She is oriented to person, place, and time. She appears well-developed and well-nourished.  HENT:  Nose: Nose normal.  Mouth/Throat: Oropharynx is clear and moist.  Eyes: EOM are normal.  Neck: Trachea normal, normal range of motion and full passive range of motion without pain. Neck supple. No JVD present. Carotid bruit is not present. No thyromegaly present.  Cardiovascular: Normal rate, regular rhythm, normal heart sounds and intact distal pulses.  Exam reveals  no gallop and no friction rub.   No murmur heard. Pulmonary/Chest: Effort normal. She has wheezes (faint exp wheezes in bil bases).  Abdominal: Soft. Bowel sounds are normal. She exhibits no distension and no mass. There is no tenderness.   Musculoskeletal: Normal range of motion.  Lymphadenopathy:    She has no cervical adenopathy.  Neurological: She is alert and oriented to person, place, and time. She has normal reflexes.  Skin: Skin is warm and dry.  Multiple skin lesions on chest wall  Psychiatric: She has a normal mood and affect. Her behavior is normal. Judgment and thought content normal.    BP 123/77 mmHg  Pulse 92  Temp(Src) 96.8 F (36 C) (Oral)  Ht 5' 4" (1.626 m)  Wt 196 lb (88.905 kg)  BMI 33.63 kg/m2  LMP 04/14/1982  hgba1c- 7.0%    Assessment & Plan:  1. Essential hypertension Do not add salt o diet - CMP14+EGFR  2. Type 2 diabetes mellitus without complication, without long-term current use of insulin (HCC) Continue to watch carbs in diet - Bayer DCA Hb A1c Waived - Microalbumin / creatinine urine ratio  3. Hyperlipidemia with target LDL less than 100 Low fat diet - Lipid panel  4. Smoker Smoking cessation encourgaged  5. Gastroesophageal reflux disease without esophagitis Avoid spicy foods Do not eat 2 hours prior to bedtime  6. Senile osteoporosis Weight bearing exericses  7. BMI 33.0-33.9,adult Discussed diet and exercise for person with BMI >25 Will recheck weight in 3-6 months   8. Screening for malignant neoplasm of the rectum - Fecal occult blood, imunochemical; Future  9. Need for hepatitis C screening Payne - Hepatitis C antibody    Labs pending Health maintenance reviewed Diet and exercise encouraged Continue all meds Follow up  In 3 months  [place], FNP

## 2015-03-23 NOTE — Patient Instructions (Signed)
Bone Health Bones protect organs, store calcium, and anchor muscles. Good health habits, such as eating nutritious foods and exercising regularly, are important for maintaining healthy bones. They can also help to prevent a condition that causes bones to lose density and become weak and brittle (osteoporosis). WHY IS BONE MASS IMPORTANT? Bone mass refers to the amount of bone tissue that you have. The higher your bone mass, the stronger your bones. An important step toward having healthy bones throughout life is to have strong and dense bones during childhood. A young adult who has a high bone mass is more likely to have a high bone mass later in life. Bone mass at its greatest it is called peak bone mass. A large decline in bone mass occurs in older adults. In women, it occurs about the time of menopause. During this time, it is important to practice good health habits, because if more bone is lost than what is replaced, the bones will become less healthy and more likely to break (fracture). If you find that you have a low bone mass, you may be able to prevent osteoporosis or further bone loss by changing your diet and lifestyle. HOW CAN I FIND OUT IF MY BONE MASS IS LOW? Bone mass can be measured with an X-ray test that is called a bone mineral density (BMD) test. This test is recommended for all women who are age 65 or older. It may also be recommended for men who are age 70 or older, or for people who are more likely to develop osteoporosis due to:  Having bones that break easily.  Having a long-term disease that weakens bones, such as kidney disease or rheumatoid arthritis.  Having menopause earlier than normal.  Taking medicine that weakens bones, such as steroids, thyroid hormones, or hormone treatment for breast cancer or prostate cancer.  Smoking.  Drinking three or more alcoholic drinks each day. WHAT ARE THE NUTRITIONAL RECOMMENDATIONS FOR HEALTHY BONES? To have healthy bones, you need  to get enough of the right minerals and vitamins. Most nutrition experts recommend getting these nutrients from the foods that you eat. Nutritional recommendations vary from person to person. Ask your health care provider what is healthy for you. Here are some general guidelines. Calcium Recommendations Calcium is the most important (essential) mineral for bone health. Most people can get enough calcium from their diet, but supplements may be recommended for people who are at risk for osteoporosis. Good sources of calcium include:  Dairy products, such as low-fat or nonfat milk, cheese, and yogurt.  Dark green leafy vegetables, such as bok choy and broccoli.  Calcium-fortified foods, such as orange juice, cereal, bread, soy beverages, and tofu products.  Nuts, such as almonds. Follow these recommended amounts for daily calcium intake:  Children, age 1-3: 700 mg.  Children, age 4-8: 1,000 mg.  Children, age 9-13: 1,300 mg.  Teens, age 14-18: 1,300 mg.  Adults, age 19-50: 1,000 mg.  Adults, age 51-70:  Men: 1,000 mg.  Women: 1,200 mg.  Adults, age 71 or older: 1,200 mg.  Pregnant and breastfeeding females:  Teens: 1,300 mg.  Adults: 1,000 mg. Vitamin D Recommendations Vitamin D is the most essential vitamin for bone health. It helps the body to absorb calcium. Sunlight stimulates the skin to make vitamin D, so be sure to get enough sunlight. If you live in a cold climate or you do not get outside often, your health care provider may recommend that you take vitamin D supplements. Good   sources of vitamin D in your diet include:  Egg yolks.  Saltwater fish.  Milk and cereal fortified with vitamin D. Follow these recommended amounts for daily vitamin D intake:  Children and teens, age 1-18: 600 international units.  Adults, age 50 or younger: 400-800 international units.  Adults, age 51 or older: 800-1,000 international units. Other Nutrients Other nutrients for bone  health include:  Phosphorus. This mineral is found in meat, poultry, dairy foods, nuts, and legumes. The recommended daily intake for adult men and adult women is 700 mg.  Magnesium. This mineral is found in seeds, nuts, dark green vegetables, and legumes. The recommended daily intake for adult men is 400-420 mg. For adult women, it is 310-320 mg.  Vitamin K. This vitamin is found in green leafy vegetables. The recommended daily intake is 120 mg for adult men and 90 mg for adult women. WHAT TYPE OF PHYSICAL ACTIVITY IS BEST FOR BUILDING AND MAINTAINING HEALTHY BONES? Weight-bearing and strength-building activities are important for building and maintaining peak bone mass. Weight-bearing activities cause muscles and bones to work against gravity. Strength-building activities increases muscle strength that supports bones. Weight-bearing and muscle-building activities include:  Walking and hiking.  Jogging and running.  Dancing.  Gym exercises.  Lifting weights.  Tennis and racquetball.  Climbing stairs.  Aerobics. Adults should get at least 30 minutes of moderate physical activity on most days. Children should get at least 60 minutes of moderate physical activity on most days. Ask your health care provide what type of exercise is best for you. WHERE CAN I FIND MORE INFORMATION? For more information, check out the following websites:  National Osteoporosis Foundation: http://nof.org/learn/basics  National Institutes of Health: http://www.niams.nih.gov/Health_Info/Bone/Bone_Health/bone_health_for_life.asp   This information is not intended to replace advice given to you by your health care provider. Make sure you discuss any questions you have with your health care provider.   Document Released: 03/16/2003 Document Revised: 05/10/2014 Document Reviewed: 12/29/2013 [employer] Interactive Patient Education 2016 [employer]  

## 2015-03-24 LAB — LIPID PANEL
CHOLESTEROL TOTAL: 177 mg/dL (ref 100–199)
Chol/HDL Ratio: 4.4 ratio units (ref 0.0–4.4)
HDL: 40 mg/dL (ref >39)
LDL CALC: 88 mg/dL (ref 0–99)
Triglycerides: 245 mg/dL — ABNORMAL HIGH (ref 0–149)
VLDL CHOLESTEROL CAL: 49 mg/dL — AB (ref 5–40)

## 2015-03-24 LAB — CMP14+EGFR
ALBUMIN: 4.6 g/dL (ref 3.6–4.8)
ALK PHOS: 74 IU/L (ref 39–117)
ALT: 38 IU/L — AB (ref 0–32)
AST: 42 IU/L — ABNORMAL HIGH (ref 0–40)
Albumin/Globulin Ratio: 1.9 (ref 1.2–2.2)
BUN / CREAT RATIO: 30 — AB (ref 11–26)
BUN: 21 mg/dL (ref 8–27)
Bilirubin Total: 0.4 mg/dL (ref 0.0–1.2)
CO2: 21 mmol/L (ref 18–29)
CREATININE: 0.7 mg/dL (ref 0.57–1.00)
Calcium: 9.9 mg/dL (ref 8.7–10.3)
Chloride: 97 mmol/L (ref 96–106)
GFR calc non Af Amer: 90 mL/min/{1.73_m2} (ref >59)
GFR, EST AFRICAN AMERICAN: 104 mL/min/{1.73_m2} (ref >59)
GLUCOSE: 148 mg/dL — AB (ref 65–99)
Globulin, Total: 2.4 g/dL (ref 1.5–4.5)
Potassium: 4.7 mmol/L (ref 3.5–5.2)
Sodium: 140 mmol/L (ref 134–144)
TOTAL PROTEIN: 7 g/dL (ref 6.0–8.5)

## 2015-03-24 LAB — HEPATITIS C ANTIBODY: Hep C Virus Ab: <0.1 s/co ratio (ref 0.0–0.9)

## 2015-03-24 LAB — MICROALBUMIN / CREATININE URINE RATIO
Creatinine, Urine: 100.1 mg/dL
MICROALB/CREAT RATIO: 14.5 mg/g creat (ref 0.0–30.0)
MICROALBUM., U, RANDOM: 14.5 ug/mL

## 2015-04-07 DIAGNOSIS — N3020 Other chronic cystitis without hematuria: Secondary | ICD-10-CM | POA: Diagnosis not present

## 2015-04-07 DIAGNOSIS — Z0000 Encounter for general adult medical examination without abnormal findings: Secondary | ICD-10-CM | POA: Diagnosis not present

## 2015-04-07 DIAGNOSIS — N393 Stress incontinence (female) (male): Secondary | ICD-10-CM | POA: Diagnosis not present

## 2015-05-17 ENCOUNTER — Other Ambulatory Visit: Payer: Self-pay | Admitting: Family Medicine

## 2015-06-01 ENCOUNTER — Other Ambulatory Visit: Payer: Self-pay | Admitting: Nurse Practitioner

## 2015-06-13 DIAGNOSIS — L920 Granuloma annulare: Secondary | ICD-10-CM | POA: Diagnosis not present

## 2015-06-13 DIAGNOSIS — L570 Actinic keratosis: Secondary | ICD-10-CM | POA: Diagnosis not present

## 2015-06-13 DIAGNOSIS — Z85828 Personal history of other malignant neoplasm of skin: Secondary | ICD-10-CM | POA: Diagnosis not present

## 2015-06-15 ENCOUNTER — Other Ambulatory Visit: Payer: Self-pay | Admitting: Nurse Practitioner

## 2015-07-03 ENCOUNTER — Ambulatory Visit (INDEPENDENT_AMBULATORY_CARE_PROVIDER_SITE_OTHER): Payer: Medicare Other | Admitting: Nurse Practitioner

## 2015-07-03 ENCOUNTER — Encounter: Payer: Self-pay | Admitting: Nurse Practitioner

## 2015-07-03 VITALS — BP 148/88 | HR 71 | Temp 97.2°F | Ht 64.0 in | Wt 202.6 lb

## 2015-07-03 DIAGNOSIS — E785 Hyperlipidemia, unspecified: Secondary | ICD-10-CM

## 2015-07-03 DIAGNOSIS — I10 Essential (primary) hypertension: Secondary | ICD-10-CM

## 2015-07-03 DIAGNOSIS — E119 Type 2 diabetes mellitus without complications: Secondary | ICD-10-CM

## 2015-07-03 DIAGNOSIS — M810 Age-related osteoporosis without current pathological fracture: Secondary | ICD-10-CM

## 2015-07-03 DIAGNOSIS — Z720 Tobacco use: Secondary | ICD-10-CM

## 2015-07-03 DIAGNOSIS — E669 Obesity, unspecified: Secondary | ICD-10-CM | POA: Diagnosis not present

## 2015-07-03 DIAGNOSIS — Z6833 Body mass index (BMI) 33.0-33.9, adult: Secondary | ICD-10-CM | POA: Diagnosis not present

## 2015-07-03 DIAGNOSIS — K219 Gastro-esophageal reflux disease without esophagitis: Secondary | ICD-10-CM

## 2015-07-03 DIAGNOSIS — M1990 Unspecified osteoarthritis, unspecified site: Secondary | ICD-10-CM

## 2015-07-03 DIAGNOSIS — F17200 Nicotine dependence, unspecified, uncomplicated: Secondary | ICD-10-CM

## 2015-07-03 DIAGNOSIS — J4520 Mild intermittent asthma, uncomplicated: Secondary | ICD-10-CM | POA: Diagnosis not present

## 2015-07-03 LAB — BAYER DCA HB A1C WAIVED: HB A1C (BAYER DCA - WAIVED): 7.8 % — ABNORMAL HIGH (ref <7.0)

## 2015-07-03 MED ORDER — FLUTICASONE-SALMETEROL 250-50 MCG/DOSE IN AEPB: 1.0000 | INHALATION_SPRAY | Freq: Two times a day (BID) | RESPIRATORY_TRACT | Status: DC

## 2015-07-03 MED ORDER — PANTOPRAZOLE SODIUM 40 MG PO TBEC: 40.0000 mg | DELAYED_RELEASE_TABLET | Freq: Every day | ORAL | Status: DC

## 2015-07-03 MED ORDER — TELMISARTAN-HCTZ 80-25 MG PO TABS: 1.0000 | ORAL_TABLET | Freq: Every morning | ORAL | Status: DC

## 2015-07-03 MED ORDER — ROSUVASTATIN CALCIUM 40 MG PO TABS: 40.0000 mg | ORAL_TABLET | Freq: Every day | ORAL | Status: DC

## 2015-07-03 MED ORDER — METFORMIN HCL 1000 MG PO TABS: 1000.0000 mg | ORAL_TABLET | Freq: Two times a day (BID) | ORAL | Status: DC

## 2015-07-03 NOTE — Progress Notes (Signed)
Subjective:    Patient ID: Rita Payne, female    DOB: 1945-04-10, 70 y.o.   MRN: [id number]   Patient here today for follow up of chronic medical problems. Patient has not taken her meds yet today.  Outpatient Encounter Prescriptions as of 03/23/2015  Medication Sig  . aspirin 81 MG tablet Take 1 tablet (81 mg total) by mouth daily. Resume 4 days post-op  . celecoxib (CELEBREX) 200 MG capsule TAKE 1 CAPSULE EVERY 12 HOURS  . cetirizine (ZYRTEC) 10 MG tablet Take 1 tablet (10 mg total) by mouth daily.  . cholecalciferol (VITAMIN D) 1000 UNITS tablet Take 1,000 Units by mouth daily.  . Fluticasone-Salmeterol (ADVAIR) 250-50 MCG/DOSE AEPB Inhale 1 puff into the lungs every 12 (twelve) hours.  Marland Kitchen FREESTYLE LITE test strip   . metFORMIN (GLUCOPHAGE) 500 MG tablet Take 1 tablet (500 mg total) by mouth 2 (two) times daily with a meal.  . pantoprazole (PROTONIX) 40 MG tablet Take 1 tablet (40 mg total) by mouth daily.  Marland Kitchen PROAIR HFA 108 (90 BASE) MCG/ACT inhaler USE 2 INHALATIONS INTO THE LUNGS EVERY 6 HOURS AS NEEDED FOR WHEEZING  . rosuvastatin (CRESTOR) 40 MG tablet TAKE 1 TABLET AT BEDTIME  . telmisartan-hydrochlorothiazide (MICARDIS HCT) 80-25 MG tablet Take 1 tablet by mouth every morning.  Marland Kitchen VAGIFEM 10 MCG TABS vaginal tablet          Hypertension This is a chronic problem. The current episode started more than 1 year ago. The problem is unchanged. The problem is controlled. Pertinent negatives include no chest pain, headaches, palpitations or shortness of breath. Risk factors for coronary artery disease include dyslipidemia, post-menopausal state and sedentary lifestyle. Past treatments include ACE inhibitors and diuretics. The current treatment provides moderate improvement. Compliance problems include diet and exercise.  There is no history of CAD/MI or CVA.  Hyperlipidemia This is a chronic problem. The current episode started more than 1 year ago. Recent lipid tests were reviewed and  are variable. She has no history of diabetes, hypothyroidism or obesity. Pertinent negatives include no chest pain or shortness of breath. Current antihyperlipidemic treatment includes statins. The current treatment provides moderate improvement of lipids. Compliance problems include adherence to diet and adherence to exercise.  Risk factors for coronary artery disease include dyslipidemia, hypertension and post-menopausal.  Diabetes She presents for her follow-up diabetic visit. She has type 2 diabetes mellitus. No MedicAlert identification noted. Her disease course has been fluctuating. There are no hypoglycemic associated symptoms. Pertinent negatives for hypoglycemia include no headaches. Pertinent negatives for diabetes include no chest pain, no fatigue, no foot paresthesias, no polydipsia, no polyphagia, no polyuria, no visual change and no weight loss. There are no hypoglycemic complications. Symptoms are stable. There are no diabetic complications. Pertinent negatives for diabetic complications include no CVA. Risk factors for coronary artery disease include diabetes mellitus, dyslipidemia, hypertension, post-menopausal and obesity. Current diabetic treatment includes oral agent (monotherapy). Her weight is stable. When asked about meal planning, she reported none. She has not had a previous visit with a dietitian. She participates in exercise every other day. Her breakfast blood glucose is taken between 9-10 am. Her breakfast blood glucose range is generally 180-200 mg/dl. An ACE inhibitor/angiotensin II receptor blocker is not being taken. She does not see a podiatrist.Eye exam is current.  GERD Protonix working better than zantac was- symptoms under good control. Asthma Uses advair daily- only uses albuterol maybe 2x a month. Patient does smoke about 6 cigarettes a day. osteoporosis  Currently on no meds- no weight bearing exercises arthritis Taking celebrex daily- helps keep pain to a  minium     Review of Systems  Constitutional: Negative.  Negative for weight loss and fatigue.  HENT: Negative.   Respiratory: Negative.  Negative for shortness of breath.   Cardiovascular: Negative.  Negative for chest pain and palpitations.  Endocrine: Negative for polydipsia, polyphagia and polyuria.  Genitourinary: Negative.   Neurological: Negative.  Negative for headaches.  Psychiatric/Behavioral: Negative.   All other systems reviewed and are negative.      Objective:   Physical Exam  Constitutional: She is oriented to person, place, and time. She appears well-developed and well-nourished.  HENT:  Nose: Nose normal.  Mouth/Throat: Oropharynx is clear and moist.  Eyes: EOM are normal.  Neck: Trachea normal, normal range of motion and full passive range of motion without pain. Neck supple. No JVD present. Carotid bruit is not present. No thyromegaly present.  Cardiovascular: Normal rate, regular rhythm, normal heart sounds and intact distal pulses.  Exam reveals no gallop and no friction rub.   No murmur heard. Pulmonary/Chest: Effort normal. She has wheezes (faint exp wheezes in bil bases).  Abdominal: Soft. Bowel sounds are normal. She exhibits no distension and no mass. There is no tenderness.  Musculoskeletal: Normal range of motion.  Lymphadenopathy:    She has no cervical adenopathy.  Neurological: She is alert and oriented to person, place, and time. She has normal reflexes.  Skin: Skin is warm and dry.  Multiple skin lesions on chest wall  Psychiatric: She has a normal mood and affect. Her behavior is normal. Judgment and thought content normal.   BP 148/88 mmHg  Pulse 71  Temp(Src) 97.2 F (36.2 C) (Oral)  Ht _0  (1.626 m)  Wt 202 lb 9.6 oz (91.899 kg)  BMI 34.76 kg/m2  LMP 04/14/1982   hgba1c 7.8%- up from 7.0% at last visit    Assessment & Plan:  1. Type 2 diabetes mellitus without complication, without long-term current use of insulin  (HCC) Increased metformin 1037m bid  Encouraged to start exercising again - Bayer DCA Hb A1c Waived - CMP14+EGFR - metFORMIN (GLUCOPHAGE) 1000 MG tablet; Take 1 tablet (1,000 mg total) by mouth 2 (two) times daily with a meal.  Dispense: 180 tablet; Refill: 1  2. Essential hypertension Do not add salt to diet - CMP14+EGFR - telmisartan-hydrochlorothiazide (MICARDIS HCT) 80-25 MG tablet; Take 1 tablet by mouth every morning.  Dispense: 90 tablet; Refill: 1  3. BMI 33.0-33.9,adult Discussed diet and exercise for person with BMI >25 Will recheck weight in 3-6 months - CMP14+EGFR  4. Hyperlipidemia with target LDL less than 100 Low fat diet - CMP14+EGFR - Lipid panel - rosuvastatin (CRESTOR) 40 MG tablet; Take 1 tablet (40 mg total) by mouth at bedtime.  Dispense: 90 tablet; Refill: 1  5. Asthma, chronic, mild intermittent, uncomplicated - Fluticasone-Salmeterol (ADVAIR) 250-50 MCG/DOSE AEPB; Inhale 1 puff into the lungs every 12 (twelve) hours.  Dispense: 180 each; Refill: 1  6. Gastroesophageal reflux disease without esophagitis Avoid spicy foods Do not eat 2 hours prior to bedtime - pantoprazole (PROTONIX) 40 MG tablet; Take 1 tablet (40 mg total) by mouth daily.  Dispense: 90 tablet; Refill: 1  7. Arthritis Exercise will help joints  8. Senile osteoporosis Weight bearing exercises  9. Obese Discussed diet and exercise for person with BMI >25 Will recheck weight in 3-6 months  10. Smoker Smoking cessation encouraged    Labs pending  Health maintenance reviewed Diet and exercise encouraged Continue all meds Follow up  In 3 months   [place], FNP

## 2015-07-03 NOTE — Patient Instructions (Signed)

## 2015-07-04 LAB — CMP14+EGFR
A/G RATIO: 1.9 (ref 1.2–2.2)
ALK PHOS: 65 IU/L (ref 39–117)
ALT: 30 IU/L (ref 0–32)
AST: 25 IU/L (ref 0–40)
Albumin: 4.4 g/dL (ref 3.6–4.8)
BUN/Creatinine Ratio: 16 (ref 12–28)
BUN: 11 mg/dL (ref 8–27)
Bilirubin Total: 0.5 mg/dL (ref 0.0–1.2)
CALCIUM: 9.6 mg/dL (ref 8.7–10.3)
CO2: 25 mmol/L (ref 18–29)
CREATININE: 0.67 mg/dL (ref 0.57–1.00)
Chloride: 99 mmol/L (ref 96–106)
GFR calc Af Amer: 104 mL/min/{1.73_m2} (ref >59)
GFR, EST NON AFRICAN AMERICAN: 91 mL/min/{1.73_m2} (ref >59)
GLOBULIN, TOTAL: 2.3 g/dL (ref 1.5–4.5)
Glucose: 132 mg/dL — ABNORMAL HIGH (ref 65–99)
Potassium: 4 mmol/L (ref 3.5–5.2)
Sodium: 142 mmol/L (ref 134–144)
Total Protein: 6.7 g/dL (ref 6.0–8.5)

## 2015-07-04 LAB — LIPID PANEL
CHOL/HDL RATIO: 4 ratio (ref 0.0–4.4)
Cholesterol, Total: 141 mg/dL (ref 100–199)
HDL: 35 mg/dL — AB (ref >39)
LDL CALC: 67 mg/dL (ref 0–99)
TRIGLYCERIDES: 197 mg/dL — AB (ref 0–149)
VLDL Cholesterol Cal: 39 mg/dL (ref 5–40)

## 2015-07-12 ENCOUNTER — Encounter: Payer: Self-pay | Admitting: Nurse Practitioner

## 2015-07-12 ENCOUNTER — Ambulatory Visit (INDEPENDENT_AMBULATORY_CARE_PROVIDER_SITE_OTHER): Payer: Medicare Other | Admitting: Nurse Practitioner

## 2015-07-12 VITALS — BP 125/78 | HR 106 | Temp 97.3°F | Ht 64.0 in | Wt 197.0 lb

## 2015-07-12 DIAGNOSIS — N3000 Acute cystitis without hematuria: Secondary | ICD-10-CM | POA: Diagnosis not present

## 2015-07-12 DIAGNOSIS — R300 Dysuria: Secondary | ICD-10-CM

## 2015-07-12 LAB — URINALYSIS, COMPLETE
BILIRUBIN UA: NEGATIVE
GLUCOSE, UA: NEGATIVE
KETONES UA: NEGATIVE
Nitrite, UA: NEGATIVE
PH UA: 6 (ref 5.0–7.5)
PROTEIN UA: NEGATIVE
SPEC GRAV UA: 1.015 (ref 1.005–1.030)
UUROB: 0.2 mg/dL (ref 0.2–1.0)

## 2015-07-12 LAB — MICROSCOPIC EXAMINATION: WBC, UA: >30 /hpf — AB (ref 0–?)

## 2015-07-12 MED ORDER — SULFAMETHOXAZOLE-TRIMETHOPRIM 800-160 MG PO TABS: 1.0000 | ORAL_TABLET | Freq: Two times a day (BID) | ORAL | Status: DC

## 2015-07-12 NOTE — Patient Instructions (Signed)
Take medication as prescribe Cotton underwear Take shower not bath Cranberry juice, yogurt Force fluids AZO over the counter X2 days Culture pending RTO prn  

## 2015-07-12 NOTE — Progress Notes (Signed)
  Subjective:    Rita Payne is a 70 y.o. female who complains of dysuria, frequency, hesitancy, nocturia and urgency. She has had symptoms for 2 days. Patient also complains of suprapubic pain. Patient denies back pain, fever and vaginal discharge. Patient does not have a history of recurrent UTI. Patient does not have a history of pyelonephritis.   The following portions of the patient's history were reviewed and updated as appropriate: allergies, current medications, past family history, past medical history, past social history, past surgical history and problem list.  Review of Systems Pertinent items are noted in HPI.    Objective:    BP 125/78 mmHg  Pulse 106  Temp(Src) 97.3 F (36.3 C) (Oral)  Ht 5\' 4"  (1.626 m)  Wt 197 lb (89.359 kg)  BMI 33.80 kg/m2  LMP 04/14/1982 General appearance: alert and cooperative Lungs: clear to auscultation bilaterally Heart: regular rate and rhythm, S1, S2 normal, no murmur, click, rub or gallop Abdomen: soft, non-tender; bowel sounds normal; no masses,  no organomegaly  Laboratory:  Urine dipstick: 2+ for leukocyte esterase.   Micro exam: >30 WBCs per HPF.    Assessment:    Acute cystitis     Plan:   Take medication as prescribe Cotton underwear Take shower not bath Cranberry juice, yogurt Force fluids AZO over the counter X2 days Culture pending RTO prn  Meds ordered this encounter  Medications  . sulfamethoxazole-trimethoprim (BACTRIM DS) 800-160 MG tablet    Sig: Take 1 tablet by mouth 2 (two) times daily.    Dispense:  14 tablet    Refill:  0    Order Specific Question:  Supervising Provider    Answer:  Eustaquio Maize [[id number]]   Mary-Margaret Hassell Done, FNP

## 2015-07-13 ENCOUNTER — Telehealth: Payer: Self-pay | Admitting: Nurse Practitioner

## 2015-07-13 NOTE — Telephone Encounter (Signed)
Pt notified of recommendation Verbalizes understanding 

## 2015-07-13 NOTE — Telephone Encounter (Signed)
Patient is concerned, since she has increased Metformin from 500 mg bid to 1000 mg bid she has had nausea and vomiting.  She said she had stopped it, came in to see you yesterday for UTI and you had her restart the 1000 mg bid.  Since doing this yesterday, she has experienced nausea and vomiting again.  Would like to know what you recommend.

## 2015-07-13 NOTE — Telephone Encounter (Signed)
Patient was told if cannot tolerate 1000mg  of metformin- started vomiting and diarrhea- told to decrease to 1/2 TID- let me know is can tolerate.

## 2015-07-14 ENCOUNTER — Telehealth: Payer: Self-pay | Admitting: Nurse Practitioner

## 2015-07-14 NOTE — Telephone Encounter (Signed)
Spoke with patient and just go back to taking BID for now- imodium for diarrhea

## 2015-07-24 DIAGNOSIS — H5213 Myopia, bilateral: Secondary | ICD-10-CM | POA: Diagnosis not present

## 2015-07-24 DIAGNOSIS — H52223 Regular astigmatism, bilateral: Secondary | ICD-10-CM | POA: Diagnosis not present

## 2015-07-24 DIAGNOSIS — H524 Presbyopia: Secondary | ICD-10-CM | POA: Diagnosis not present

## 2015-07-24 DIAGNOSIS — H43813 Vitreous degeneration, bilateral: Secondary | ICD-10-CM | POA: Diagnosis not present

## 2015-07-24 LAB — HM DIABETES EYE EXAM

## 2015-08-30 ENCOUNTER — Encounter: Payer: Medicare Other | Admitting: *Deleted

## 2015-08-30 DIAGNOSIS — Z1231 Encounter for screening mammogram for malignant neoplasm of breast: Secondary | ICD-10-CM | POA: Diagnosis not present

## 2015-08-30 LAB — HM MAMMOGRAPHY

## 2015-09-03 ENCOUNTER — Other Ambulatory Visit: Payer: Self-pay | Admitting: Nurse Practitioner

## 2015-09-13 ENCOUNTER — Other Ambulatory Visit: Payer: Self-pay | Admitting: Nurse Practitioner

## 2015-10-05 ENCOUNTER — Ambulatory Visit (INDEPENDENT_AMBULATORY_CARE_PROVIDER_SITE_OTHER): Payer: Medicare Other | Admitting: Nurse Practitioner

## 2015-10-05 ENCOUNTER — Encounter: Payer: Self-pay | Admitting: Nurse Practitioner

## 2015-10-05 VITALS — BP 139/89 | HR 84 | Temp 96.9°F | Ht 64.0 in | Wt 196.0 lb

## 2015-10-05 DIAGNOSIS — J4520 Mild intermittent asthma, uncomplicated: Secondary | ICD-10-CM

## 2015-10-05 DIAGNOSIS — E785 Hyperlipidemia, unspecified: Secondary | ICD-10-CM | POA: Diagnosis not present

## 2015-10-05 DIAGNOSIS — I10 Essential (primary) hypertension: Secondary | ICD-10-CM

## 2015-10-05 DIAGNOSIS — E119 Type 2 diabetes mellitus without complications: Secondary | ICD-10-CM

## 2015-10-05 DIAGNOSIS — Z23 Encounter for immunization: Secondary | ICD-10-CM

## 2015-10-05 LAB — BAYER DCA HB A1C WAIVED: HB A1C: 6.9 % (ref <7.0)

## 2015-10-05 MED ORDER — METFORMIN HCL 500 MG PO TABS: 500.0000 mg | ORAL_TABLET | Freq: Two times a day (BID) | ORAL | 1 refills | Status: DC

## 2015-10-05 NOTE — Patient Instructions (Signed)

## 2015-10-05 NOTE — Progress Notes (Signed)
Subjective:    Patient ID: Rita Payne, female    DOB: 1945/08/24, 70 y.o.   MRN: [id number]   Patient here today for follow up of chronic medical problems. No changes since last visit. No complaints today.  Outpatient Encounter Prescriptions as of 03/23/2015  Medication Sig  . aspirin 81 MG tablet Take 1 tablet (81 mg total) by mouth daily. Resume 4 days post-op  . celecoxib (CELEBREX) 200 MG capsule TAKE 1 CAPSULE EVERY 12 HOURS  . cetirizine (ZYRTEC) 10 MG tablet Take 1 tablet (10 mg total) by mouth daily.  . cholecalciferol (VITAMIN D) 1000 UNITS tablet Take 1,000 Units by mouth daily.  . Fluticasone-Salmeterol (ADVAIR) 250-50 MCG/DOSE AEPB Inhale 1 puff into the lungs every 12 (twelve) hours.  Marland Kitchen FREESTYLE LITE test strip   . metFORMIN (GLUCOPHAGE) 500 MG tablet Take 1 tablet (500 mg total) by mouth 2 (two) times daily with a meal.  . pantoprazole (PROTONIX) 40 MG tablet Take 1 tablet (40 mg total) by mouth daily.  Marland Kitchen PROAIR HFA 108 (90 BASE) MCG/ACT inhaler USE 2 INHALATIONS INTO THE LUNGS EVERY 6 HOURS AS NEEDED FOR WHEEZING  . rosuvastatin (CRESTOR) 40 MG tablet TAKE 1 TABLET AT BEDTIME  . telmisartan-hydrochlorothiazide (MICARDIS HCT) 80-25 MG tablet Take 1 tablet by mouth every morning.  Marland Kitchen VAGIFEM 10 MCG TABS vaginal tablet          Hypertension  This is a chronic problem. The current episode started more than 1 year ago. The problem is unchanged. The problem is controlled. Pertinent negatives include no chest pain, headaches, palpitations or shortness of breath. Risk factors for coronary artery disease include dyslipidemia, post-menopausal state and sedentary lifestyle. Past treatments include ACE inhibitors and diuretics. The current treatment provides moderate improvement. Compliance problems include diet and exercise.  There is no history of CAD/MI or CVA.  Hyperlipidemia  This is a chronic problem. The current episode started more than 1 year ago. Recent lipid tests were  reviewed and are variable. She has no history of diabetes, hypothyroidism or obesity. Pertinent negatives include no chest pain or shortness of breath. Current antihyperlipidemic treatment includes statins. The current treatment provides moderate improvement of lipids. Compliance problems include adherence to diet and adherence to exercise.  Risk factors for coronary artery disease include dyslipidemia, hypertension and post-menopausal.  Diabetes  She presents for her follow-up diabetic visit. She has type 2 diabetes mellitus. No MedicAlert identification noted. Her disease course has been fluctuating. There are no hypoglycemic associated symptoms. Pertinent negatives for hypoglycemia include no headaches. Pertinent negatives for diabetes include no chest pain, no fatigue, no foot paresthesias, no polydipsia, no polyphagia, no polyuria, no visual change and no weight loss. There are no hypoglycemic complications. Symptoms are stable. There are no diabetic complications. Pertinent negatives for diabetic complications include no CVA. Risk factors for coronary artery disease include diabetes mellitus, dyslipidemia, hypertension, post-menopausal and obesity. Current diabetic treatment includes oral agent (monotherapy) (increased metformin to '1000mg'$ bid and that made her nauseaous with vomiting- had to go back to '500mg'$  bid-vomiting stopped.). Her weight is stable. When asked about meal planning, she reported none. She has not had a previous visit with a dietitian. She participates in exercise every other day. Her breakfast blood glucose is taken between 9-10 am. Her breakfast blood glucose range is generally 180-200 mg/dl. An ACE inhibitor/angiotensin II receptor blocker is not being taken. She does not see a podiatrist.Eye exam is current.  GERD Protonix working better than zantac was- symptoms under  good control. Asthma Uses advair daily- only uses albuterol maybe 2x a month. Patient does smoke about 6 cigarettes  a day. osteoporosis Currently on no meds- no weight bearing exercises arthritis Taking celebrex daily- helps keep pain to a minium     Review of Systems  Constitutional: Negative.  Negative for fatigue and weight loss.  HENT: Negative.   Respiratory: Negative.  Negative for shortness of breath.   Cardiovascular: Negative.  Negative for chest pain and palpitations.  Endocrine: Negative for polydipsia, polyphagia and polyuria.  Genitourinary: Negative.   Neurological: Negative.  Negative for headaches.  Psychiatric/Behavioral: Negative.   All other systems reviewed and are negative.      Objective:   Physical Exam  Constitutional: She is oriented to person, place, and time. She appears well-developed and well-nourished.  HENT:  Nose: Nose normal.  Mouth/Throat: Oropharynx is clear and moist.  Eyes: EOM are normal.  Neck: Trachea normal, normal range of motion and full passive range of motion without pain. Neck supple. No JVD present. Carotid bruit is not present. No thyromegaly present.  Cardiovascular: Normal rate, regular rhythm, normal heart sounds and intact distal pulses.  Exam reveals no gallop and no friction rub.   No murmur heard. Pulmonary/Chest: Effort normal. She has wheezes (faint exp wheezes in bil bases).  Abdominal: Soft. Bowel sounds are normal. She exhibits no distension and no mass. There is no tenderness.  Musculoskeletal: Normal range of motion.  Lymphadenopathy:    She has no cervical adenopathy.  Neurological: She is alert and oriented to person, place, and time. She has normal reflexes.  Skin: Skin is warm and dry.  Multiple skin lesions on chest wall  Psychiatric: She has a normal mood and affect. Her behavior is normal. Judgment and thought content normal.   BP 139/89   Pulse 84   Temp (!) 96.9 F (36.1 C) (Oral)   Ht '5\' 4"'$  (1.626 m)   Wt 196 lb (88.9 kg)   LMP 04/14/1982   BMI 33.64 kg/m    hgba1c 6.9%- down from 7.8% at last visit      Assessment & Plan:  1. Hyperlipidemia with target LDL less than 100 Low fat diet - Lipid panel  2. Type 2 diabetes mellitus without complication, without long-term current use of insulin (HCC) cointinue to watch carbs in diet - Bayer DCA Hb A1c Waived - metFORMIN (GLUCOPHAGE) 500 MG tablet; Take 1 tablet (500 mg total) by mouth 2 (two) times daily with a meal.  Dispense: 180 tablet; Refill: 1  3. Essential hypertension Do not add salt to diet - CMP14+EGFR  4. Asthma, chronic, mild intermittent, uncomplicated    Labs pending Health maintenance reviewed Diet and exercise encouraged Continue all meds Follow up  In 3 month   [place], FNP

## 2015-10-06 LAB — CMP14+EGFR
ALT: 60 IU/L — AB (ref 0–32)
AST: 45 IU/L — AB (ref 0–40)
Albumin/Globulin Ratio: 1.7 (ref 1.2–2.2)
Albumin: 4.3 g/dL (ref 3.6–4.8)
Alkaline Phosphatase: 75 IU/L (ref 39–117)
BUN/Creatinine Ratio: 17 (ref 12–28)
BUN: 12 mg/dL (ref 8–27)
Bilirubin Total: 0.4 mg/dL (ref 0.0–1.2)
CALCIUM: 9.5 mg/dL (ref 8.7–10.3)
CO2: 25 mmol/L (ref 18–29)
CREATININE: 0.71 mg/dL (ref 0.57–1.00)
Chloride: 95 mmol/L — ABNORMAL LOW (ref 96–106)
GFR calc Af Amer: 101 mL/min/{1.73_m2} (ref >59)
GFR, EST NON AFRICAN AMERICAN: 88 mL/min/{1.73_m2} (ref >59)
Globulin, Total: 2.6 g/dL (ref 1.5–4.5)
Glucose: 132 mg/dL — ABNORMAL HIGH (ref 65–99)
Potassium: 4.3 mmol/L (ref 3.5–5.2)
Sodium: 138 mmol/L (ref 134–144)
Total Protein: 6.9 g/dL (ref 6.0–8.5)

## 2015-10-06 LAB — LIPID PANEL
CHOL/HDL RATIO: 7.4 ratio — AB (ref 0.0–4.4)
Cholesterol, Total: 236 mg/dL — ABNORMAL HIGH (ref 100–199)
HDL: 32 mg/dL — AB (ref >39)
LDL CALC: 136 mg/dL — AB (ref 0–99)
TRIGLYCERIDES: 338 mg/dL — AB (ref 0–149)
VLDL Cholesterol Cal: 68 mg/dL — ABNORMAL HIGH (ref 5–40)

## 2015-10-10 DIAGNOSIS — N3020 Other chronic cystitis without hematuria: Secondary | ICD-10-CM | POA: Diagnosis not present

## 2015-10-10 DIAGNOSIS — N952 Postmenopausal atrophic vaginitis: Secondary | ICD-10-CM | POA: Diagnosis not present

## 2015-10-10 DIAGNOSIS — N393 Stress incontinence (female) (male): Secondary | ICD-10-CM | POA: Diagnosis not present

## 2015-12-03 ENCOUNTER — Other Ambulatory Visit: Payer: Self-pay | Admitting: Nurse Practitioner

## 2015-12-19 ENCOUNTER — Other Ambulatory Visit: Payer: Self-pay | Admitting: Nurse Practitioner

## 2015-12-26 DIAGNOSIS — L821 Other seborrheic keratosis: Secondary | ICD-10-CM | POA: Diagnosis not present

## 2015-12-26 DIAGNOSIS — L570 Actinic keratosis: Secondary | ICD-10-CM | POA: Diagnosis not present

## 2015-12-26 DIAGNOSIS — Z85828 Personal history of other malignant neoplasm of skin: Secondary | ICD-10-CM | POA: Diagnosis not present

## 2016-01-02 ENCOUNTER — Other Ambulatory Visit: Payer: Self-pay | Admitting: Nurse Practitioner

## 2016-01-02 DIAGNOSIS — I10 Essential (primary) hypertension: Secondary | ICD-10-CM

## 2016-01-02 DIAGNOSIS — K219 Gastro-esophageal reflux disease without esophagitis: Secondary | ICD-10-CM

## 2016-01-02 DIAGNOSIS — J4520 Mild intermittent asthma, uncomplicated: Secondary | ICD-10-CM

## 2016-01-11 ENCOUNTER — Ambulatory Visit (INDEPENDENT_AMBULATORY_CARE_PROVIDER_SITE_OTHER): Payer: Medicare Other | Admitting: Nurse Practitioner

## 2016-01-11 ENCOUNTER — Encounter: Payer: Self-pay | Admitting: Nurse Practitioner

## 2016-01-11 VITALS — BP 138/83 | HR 94 | Temp 96.7°F | Ht 64.0 in | Wt 198.0 lb

## 2016-01-11 DIAGNOSIS — E119 Type 2 diabetes mellitus without complications: Secondary | ICD-10-CM

## 2016-01-11 DIAGNOSIS — M810 Age-related osteoporosis without current pathological fracture: Secondary | ICD-10-CM

## 2016-01-11 DIAGNOSIS — J4520 Mild intermittent asthma, uncomplicated: Secondary | ICD-10-CM

## 2016-01-11 DIAGNOSIS — Z6833 Body mass index (BMI) 33.0-33.9, adult: Secondary | ICD-10-CM

## 2016-01-11 DIAGNOSIS — M1990 Unspecified osteoarthritis, unspecified site: Secondary | ICD-10-CM

## 2016-01-11 DIAGNOSIS — K219 Gastro-esophageal reflux disease without esophagitis: Secondary | ICD-10-CM

## 2016-01-11 DIAGNOSIS — I10 Essential (primary) hypertension: Secondary | ICD-10-CM | POA: Diagnosis not present

## 2016-01-11 DIAGNOSIS — E785 Hyperlipidemia, unspecified: Secondary | ICD-10-CM

## 2016-01-11 LAB — CMP14+EGFR
A/G RATIO: 1.6 (ref 1.2–2.2)
ALBUMIN: 4.3 g/dL (ref 3.6–4.8)
ALK PHOS: 75 IU/L (ref 39–117)
ALT: 67 IU/L — ABNORMAL HIGH (ref 0–32)
AST: 48 IU/L — AB (ref 0–40)
BUN / CREAT RATIO: 21 (ref 12–28)
BUN: 16 mg/dL (ref 8–27)
Bilirubin Total: 0.3 mg/dL (ref 0.0–1.2)
CHLORIDE: 95 mmol/L — AB (ref 96–106)
CO2: 25 mmol/L (ref 18–29)
Calcium: 10 mg/dL (ref 8.7–10.3)
Creatinine, Ser: 0.77 mg/dL (ref 0.57–1.00)
GFR calc Af Amer: 92 mL/min/{1.73_m2} (ref >59)
GFR calc non Af Amer: 80 mL/min/{1.73_m2} (ref >59)
GLOBULIN, TOTAL: 2.7 g/dL (ref 1.5–4.5)
Glucose: 158 mg/dL — ABNORMAL HIGH (ref 65–99)
Potassium: 4.5 mmol/L (ref 3.5–5.2)
SODIUM: 138 mmol/L (ref 134–144)
Total Protein: 7 g/dL (ref 6.0–8.5)

## 2016-01-11 LAB — LIPID PANEL
CHOLESTEROL TOTAL: 261 mg/dL — AB (ref 100–199)
Chol/HDL Ratio: 7.3 ratio units — ABNORMAL HIGH (ref 0.0–4.4)
HDL: 36 mg/dL — ABNORMAL LOW (ref >39)
TRIGLYCERIDES: 448 mg/dL — AB (ref 0–149)

## 2016-01-11 LAB — BAYER DCA HB A1C WAIVED: HB A1C (BAYER DCA - WAIVED): 7.1 % — ABNORMAL HIGH (ref <7.0)

## 2016-01-11 MED ORDER — PANTOPRAZOLE SODIUM 40 MG PO TBEC: 40.0000 mg | DELAYED_RELEASE_TABLET | Freq: Every day | ORAL | 1 refills | Status: DC

## 2016-01-11 MED ORDER — FLUTICASONE-SALMETEROL 250-50 MCG/DOSE IN AEPB: INHALATION_SPRAY | RESPIRATORY_TRACT | 1 refills | Status: DC

## 2016-01-11 MED ORDER — TELMISARTAN-HCTZ 80-25 MG PO TABS: 1.0000 | ORAL_TABLET | Freq: Every morning | ORAL | 1 refills | Status: DC

## 2016-01-11 MED ORDER — ROSUVASTATIN CALCIUM 40 MG PO TABS: 40.0000 mg | ORAL_TABLET | Freq: Every day | ORAL | 1 refills | Status: DC

## 2016-01-11 NOTE — Patient Instructions (Signed)
Carbohydrate Counting for Diabetes Mellitus, Adult Carbohydrate counting is a method for keeping track of how many carbohydrates you eat. Eating carbohydrates naturally increases the amount of sugar (glucose) in the blood. Counting how many carbohydrates you eat helps keep your blood glucose within normal limits, which helps you manage your diabetes (diabetes mellitus). It is important to know how many carbohydrates you can safely have in each meal. This is different for every person. A diet and nutrition specialist (registered dietitian) can help you make a meal plan and calculate how many carbohydrates you should have at each meal and snack. Carbohydrates are found in the following foods:  Grains, such as breads and cereals.  Dried beans and soy products.  Starchy vegetables, such as potatoes, peas, and corn.  Fruit and fruit juices.  Milk and yogurt.  Sweets and snack foods, such as cake, cookies, candy, chips, and soft drinks. How do I count carbohydrates? There are two ways to count carbohydrates in food. You can use either of the methods or a combination of both. Reading "Nutrition Facts" on packaged food  The "Nutrition Facts" list is included on the labels of almost all packaged foods and beverages in the [place] It includes:  The serving size.  Information about nutrients in each serving, including the grams (g) of carbohydrate per serving. To use the "Nutrition Facts":  Decide how many servings you will have.  Multiply the number of servings by the number of carbohydrates per serving.  The resulting number is the total amount of carbohydrates that you will be having. Learning standard serving sizes of other foods  When you eat foods containing carbohydrates that are not packaged or do not include "Nutrition Facts" on the label, you need to measure the servings in order to count the amount of carbohydrates:  Measure the foods that you will eat with a food scale or measuring  cup, if needed.  Decide how many standard-size servings you will eat.  Multiply the number of servings by 15. Most carbohydrate-rich foods have about 15 g of carbohydrates per serving.  For example, if you eat 8 oz (170 g) of strawberries, you will have eaten 2 servings and 30 g of carbohydrates (2 servings x 15 g = 30 g).  For foods that have more than one food mixed, such as soups and casseroles, you must count the carbohydrates in each food that is included. The following list contains standard serving sizes of common carbohydrate-rich foods. Each of these servings has about 15 g of carbohydrates:   hamburger bun or  English muffin.   oz (15 mL) syrup.   oz (14 g) jelly.  1 slice of bread.  1 six-inch tortilla.  3 oz (85 g) cooked rice or pasta.  4 oz (113 g) cooked dried beans.  4 oz (113 g) starchy vegetable, such as peas, corn, or potatoes.  4 oz (113 g) hot cereal.  4 oz (113 g) mashed potatoes or  of a large baked potato.  4 oz (113 g) canned or frozen fruit.  4 oz (120 mL) fruit juice.  4-6 crackers.  6 chicken nuggets.  6 oz (170 g) unsweetened dry cereal.  6 oz (170 g) plain fat-free yogurt or yogurt sweetened with artificial sweeteners.  8 oz (240 mL) milk.  8 oz (170 g) fresh fruit or one small piece of fruit.  24 oz (680 g) popped popcorn. Example of carbohydrate counting Sample meal  3 oz (85 g) chicken breast.  6 oz (  170 g) brown rice.  4 oz (113 g) corn.  8 oz (240 mL) milk.  8 oz (170 g) strawberries with sugar-free whipped topping. Carbohydrate calculation 1. Identify the foods that contain carbohydrates:  Rice.  Corn.  Milk.  Strawberries. 2. Calculate how many servings you have of each food:  2 servings rice.  1 serving corn.  1 serving milk.  1 serving strawberries. 3. Multiply each number of servings by 15 g:  2 servings rice x 15 g = 30 g.  1 serving corn x 15 g = 15 g.  1 serving milk x 15 g = 15  g.  1 serving strawberries x 15 g = 15 g. 4. Add together all of the amounts to find the total grams of carbohydrates eaten:  30 g + 15 g + 15 g + 15 g = 75 g of carbohydrates total. This information is not intended to replace advice given to you by your health care provider. Make sure you discuss any questions you have with your health care provider. Document Released: 12/24/2004 Document Revised: 07/14/2015 Document Reviewed: 06/07/2015 [employer] Interactive Patient Education  2017 [employer]  

## 2016-01-11 NOTE — Progress Notes (Signed)
Subjective:    Patient ID: Rita Payne, female    DOB: Dec 24, 1945, 71 y.o.   MRN: [id number]   Patient here today for follow up of chronic medical problems. No changes since last visit. No complaints today.  Outpatient Encounter Prescriptions as of 03/23/2015  Medication Sig  . aspirin 81 MG tablet Take 1 tablet (81 mg total) by mouth daily. Resume 4 days post-op  . celecoxib (CELEBREX) 200 MG capsule TAKE 1 CAPSULE EVERY 12 HOURS  . cetirizine (ZYRTEC) 10 MG tablet Take 1 tablet (10 mg total) by mouth daily.  . cholecalciferol (VITAMIN D) 1000 UNITS tablet Take 1,000 Units by mouth daily.  . Fluticasone-Salmeterol (ADVAIR) 250-50 MCG/DOSE AEPB Inhale 1 puff into the lungs every 12 (twelve) hours.  Marland Kitchen FREESTYLE LITE test strip   . metFORMIN (GLUCOPHAGE) 500 MG tablet Take 1 tablet (500 mg total) by mouth 2 (two) times daily with a meal.  . pantoprazole (PROTONIX) 40 MG tablet Take 1 tablet (40 mg total) by mouth daily.  Marland Kitchen PROAIR HFA 108 (90 BASE) MCG/ACT inhaler USE 2 INHALATIONS INTO THE LUNGS EVERY 6 HOURS AS NEEDED FOR WHEEZING  . rosuvastatin (CRESTOR) 40 MG tablet TAKE 1 TABLET AT BEDTIME  . telmisartan-hydrochlorothiazide (MICARDIS HCT) 80-25 MG tablet Take 1 tablet by mouth every morning.  Marland Kitchen VAGIFEM 10 MCG TABS vaginal tablet         Hypertension  This is a chronic problem. The current episode started more than 1 year ago. The problem is unchanged. The problem is controlled. Pertinent negatives include no chest pain, headaches, palpitations or shortness of breath. Risk factors for coronary artery disease include dyslipidemia, post-menopausal state and sedentary lifestyle. Past treatments include ACE inhibitors and diuretics. The current treatment provides moderate improvement. Compliance problems include diet and exercise.  There is no history of CAD/MI or CVA.  Hyperlipidemia  This is a chronic problem. The current episode started more than 1 year ago. Recent lipid tests were  reviewed and are variable. She has no history of diabetes, hypothyroidism or obesity. Pertinent negatives include no chest pain or shortness of breath. Current antihyperlipidemic treatment includes statins. The current treatment provides moderate improvement of lipids. Compliance problems include adherence to diet and adherence to exercise.  Risk factors for coronary artery disease include dyslipidemia, hypertension and post-menopausal.  Diabetes  She presents for her follow-up diabetic visit. She has type 2 diabetes mellitus. No MedicAlert identification noted. Her disease course has been fluctuating. There are no hypoglycemic associated symptoms. Pertinent negatives for hypoglycemia include no headaches. Pertinent negatives for diabetes include no chest pain, no fatigue, no foot paresthesias, no polydipsia, no polyphagia, no polyuria, no visual change and no weight loss. There are no hypoglycemic complications. Symptoms are stable. There are no diabetic complications. Pertinent negatives for diabetic complications include no CVA. Risk factors for coronary artery disease include diabetes mellitus, dyslipidemia, hypertension, post-menopausal and obesity. Current diabetic treatment includes oral agent (monotherapy) (increased metformin to 1038mbid and that made her nauseaous with vomiting- had to go back to 5069mbid-vomiting stopped.). Her weight is stable. When asked about meal planning, she reported none. She has not had a previous visit with a dietitian. She participates in exercise every other day. Her breakfast blood glucose is taken between 9-10 am. Her breakfast blood glucose range is generally 180-200 mg/dl. An ACE inhibitor/angiotensin II receptor blocker is not being taken. She does not see a podiatrist.Eye exam is current.  GERD Protonix working better than zantac was- symptoms under good  control. Asthma Uses advair daily- only uses albuterol maybe 2x a month. Patient does smoke about 6 cigarettes  a day. osteoporosis Currently on no meds- no weight bearing exercises arthritis Taking celebrex daily- helps keep pain to a minium     Review of Systems  Constitutional: Negative.  Negative for fatigue and weight loss.  HENT: Negative.   Respiratory: Negative.  Negative for shortness of breath.   Cardiovascular: Negative.  Negative for chest pain and palpitations.  Endocrine: Negative for polydipsia, polyphagia and polyuria.  Genitourinary: Negative.   Neurological: Negative.  Negative for headaches.  Psychiatric/Behavioral: Negative.   All other systems reviewed and are negative.      Objective:   Physical Exam  Constitutional: She is oriented to person, place, and time. She appears well-developed and well-nourished.  HENT:  Nose: Nose normal.  Mouth/Throat: Oropharynx is clear and moist.  Eyes: EOM are normal.  Neck: Trachea normal, normal range of motion and full passive range of motion without pain. Neck supple. No JVD present. Carotid bruit is not present. No thyromegaly present.  Cardiovascular: Normal rate, regular rhythm, normal heart sounds and intact distal pulses.  Exam reveals no gallop and no friction rub.   No murmur heard. Pulmonary/Chest: Effort normal. She has wheezes (faint exp wheezes in bil bases).  Abdominal: Soft. Bowel sounds are normal. She exhibits no distension and no mass. There is no tenderness.  Musculoskeletal: Normal range of motion.  Lymphadenopathy:    She has no cervical adenopathy.  Neurological: She is alert and oriented to person, place, and time. She has normal reflexes.  Skin: Skin is warm and dry.  Multiple skin lesions on chest wall  Psychiatric: She has a normal mood and affect. Her behavior is normal. Judgment and thought content normal.   BP 138/83   Pulse 94   Temp (!) 96.7 F (35.9 C) (Oral)   Ht _0  (1.626 m)   Wt 198 lb (89.8 kg)   LMP 04/14/1982   BMI 33.99 kg/m    hgba1c 7.1% up from 6.9%    Assessment & Plan:    1. Hyperlipidemia with target LDL less than 100 Low fat diet - Lipid panel - rosuvastatin (CRESTOR) 40 MG tablet; Take 1 tablet (40 mg total) by mouth at bedtime.  Dispense: 90 tablet; Refill: 1  2. Type 2 diabetes mellitus without complication, without long-term current use of insulin (HCC) Carb counting encouraged - Bayer DCA Hb A1c Waived  3. Essential hypertension Low sodium diet - CMP14+EGFR - telmisartan-hydrochlorothiazide (MICARDIS HCT) 80-25 MG tablet; Take 1 tablet by mouth every morning.  Dispense: 90 tablet; Refill: 1  4. Gastroesophageal reflux disease without esophagitis Avoid spicy foods Do not eat 2 hours prior to bedtime - pantoprazole (PROTONIX) 40 MG tablet; Take 1 tablet (40 mg total) by mouth daily.  Dispense: 90 tablet; Refill: 1  5. Arthritis Exercise encouraged  6. BMI 33.0-33.9,adult Discussed diet and exercise for person with BMI >25 Will recheck weight in 3-6 months  7. Senile osteoporosis Weight bearing exercises  8. Morbid obesity ([place]) Discussed diet and exercise for person with BMI >25 Will recheck weight in 3-6 months  9. Asthma, chronic, mild intermittent, uncomplicated - Fluticasone-Salmeterol (ADVAIR DISKUS) 250-50 MCG/DOSE AEPB; USE 1 INHALATION EVERY 12 HOURS  Dispense: 180 each; Refill: 1  encouraged to do hemoccult cards given at last appointment Labs pending Health maintenance reviewed Diet and exercise encouraged Continue all meds Follow up  In 3 month   [place], FNP

## 2016-01-12 ENCOUNTER — Other Ambulatory Visit: Payer: Self-pay | Admitting: Nurse Practitioner

## 2016-01-12 MED ORDER — FENOFIBRATE 145 MG PO TABS: 145.0000 mg | ORAL_TABLET | Freq: Every day | ORAL | 1 refills | Status: DC

## 2016-01-27 ENCOUNTER — Other Ambulatory Visit: Payer: Self-pay | Admitting: Nurse Practitioner

## 2016-01-27 DIAGNOSIS — E785 Hyperlipidemia, unspecified: Secondary | ICD-10-CM

## 2016-02-26 ENCOUNTER — Ambulatory Visit: Payer: Medicare Other | Admitting: Pediatrics

## 2016-02-26 ENCOUNTER — Other Ambulatory Visit: Payer: Self-pay | Admitting: Nurse Practitioner

## 2016-02-26 ENCOUNTER — Telehealth: Payer: Self-pay | Admitting: Nurse Practitioner

## 2016-02-26 MED ORDER — OSELTAMIVIR PHOSPHATE 75 MG PO CAPS: 75.0000 mg | ORAL_CAPSULE | Freq: Two times a day (BID) | ORAL | 0 refills | Status: DC

## 2016-02-26 NOTE — Progress Notes (Signed)
Pt aware Rx called to pharmacy. appt for tonight cancelled

## 2016-02-26 NOTE — Telephone Encounter (Signed)
Fever 102.6

## 2016-02-26 NOTE — Telephone Encounter (Signed)
Sxs started Saturday morning. With head & chest congestion, nasal drainage is clear. Has cough. Along with other sxs already stated.

## 2016-02-26 NOTE — Telephone Encounter (Signed)
Does she have fever

## 2016-02-26 NOTE — Progress Notes (Signed)
tamiflu sent to phrmacy

## 2016-03-01 ENCOUNTER — Other Ambulatory Visit: Payer: Self-pay | Admitting: Nurse Practitioner

## 2016-03-03 ENCOUNTER — Other Ambulatory Visit: Payer: Self-pay | Admitting: Nurse Practitioner

## 2016-03-22 ENCOUNTER — Encounter: Payer: Self-pay | Admitting: Physician Assistant

## 2016-03-22 ENCOUNTER — Ambulatory Visit (INDEPENDENT_AMBULATORY_CARE_PROVIDER_SITE_OTHER): Payer: Medicare Other | Admitting: Physician Assistant

## 2016-03-22 VITALS — BP 143/85 | HR 97 | Temp 96.1°F | Ht 64.0 in | Wt 199.0 lb

## 2016-03-22 DIAGNOSIS — R399 Unspecified symptoms and signs involving the genitourinary system: Secondary | ICD-10-CM

## 2016-03-22 LAB — URINALYSIS, COMPLETE
Bilirubin, UA: NEGATIVE
GLUCOSE, UA: NEGATIVE
Ketones, UA: NEGATIVE
Leukocytes, UA: NEGATIVE
NITRITE UA: NEGATIVE
PH UA: 6.5 (ref 5.0–7.5)
RBC, UA: NEGATIVE
Specific Gravity, UA: 1.01 (ref 1.005–1.030)
Urobilinogen, Ur: 0.2 mg/dL (ref 0.2–1.0)

## 2016-03-22 LAB — MICROSCOPIC EXAMINATION
BACTERIA UA: NONE SEEN
RBC MICROSCOPIC, UA: NONE SEEN /HPF (ref 0–?)
Renal Epithel, UA: NONE SEEN /hpf
WBC, UA: NONE SEEN /hpf (ref 0–?)

## 2016-03-22 MED ORDER — SULFAMETHOXAZOLE-TRIMETHOPRIM 800-160 MG PO TABS: 1.0000 | ORAL_TABLET | Freq: Two times a day (BID) | ORAL | 0 refills | Status: DC

## 2016-03-22 NOTE — Patient Instructions (Signed)
Interstitial Cystitis Interstitial cystitis is a condition that causes inflammation of the bladder. The bladder is a hollow organ in the lower part of your abdomen. It stores urine after the urine is made by your kidneys. With interstitial cystitis, you may have pain in the bladder area. You may also have a frequent and urgent need to urinate. The severity of interstitial cystitis can vary from person to person. You may have flare-ups of the condition, and then it may go away for a while. For many people who have this condition, it becomes a long-term problem. What are the causes? The cause of this condition is not known. What increases the risk? This condition is more likely to develop in women. What are the signs or symptoms? Symptoms of interstitial cystitis vary, and they can change over time. Symptoms may include:  Discomfort or pain in the bladder area. This can range from mild to severe. The pain may change in intensity as the bladder fills with urine or as it empties.  Pelvic pain.  An urgent need to urinate.  Frequent urination.  Pain during sexual intercourse.  Pinpoint bleeding on the bladder wall. For women, the symptoms often get worse during menstruation. How is this diagnosed? This condition is diagnosed by evaluating your symptoms and ruling out other causes. A physical exam will be done. Various tests may be done to rule out other conditions. Common tests include:  Urine tests.  Cystoscopy. In this test, a tool that is like a very thin telescope is used to look into your bladder.  Biopsy. This involves taking a sample of tissue from the bladder wall to be examined under a microscope. How is this treated? There is no cure for interstitial cystitis, but treatment methods are available to control your symptoms. Work closely with your health care provider to find the treatments that will be most effective for you. Treatment options may include:  Medicines to relieve pain  and to help reduce the number of times that you feel the need to urinate.  Bladder training. This involves learning ways to control when you urinate, such as:  Urinating at scheduled times.  Training yourself to delay urination.  Doing exercises (Kegel exercises) to strengthen the muscles that control urine flow.  Lifestyle changes, such as changing your diet or taking steps to control stress.  Use of a device that provides electrical stimulation in order to reduce pain.  A procedure that stretches your bladder by filling it with air or fluid.  Surgery. This is rare. It is only done for extreme cases if other treatments do not help. Follow these instructions at home:  Take medicines only as directed by your health care provider.  Use bladder training techniques as directed.  Keep a bladder diary to find out which foods, liquids, or activities make your symptoms worse.  Use your bladder diary to schedule bathroom trips. If you are away from home, plan to be near a bathroom at each of your scheduled times.  Make sure you urinate just before you leave the house and just before you go to bed.  Do Kegel exercises as directed by your health care provider.  Do not drink alcohol.  Do not use any tobacco products, including cigarettes, chewing tobacco, or electronic cigarettes. If you need help quitting, ask your health care provider.  Make dietary changes as directed by your health care provider. You may need to avoid spicy foods and foods that contain a high amount of potassium.  Limit your drinking of beverages that stimulate urination. These include soda, coffee, and tea.  Keep all follow-up visits as directed by your health care provider. This is important. Contact a health care provider if:  Your symptoms do not get better after treatment.  Your pain and discomfort are getting worse.  You have more frequent urges to urinate.  You have a fever. Get help right away  if:  You are not able to control your bladder at all. This information is not intended to replace advice given to you by your health care provider. Make sure you discuss any questions you have with your health care provider. Document Released: 08/25/2003 Document Revised: 06/01/2015 Document Reviewed: 08/31/2013 Elsevier Interactive Patient Education  2017 [employer].

## 2016-03-22 NOTE — Progress Notes (Signed)
BP (!) 143/85   Pulse 97   Temp (!) 96.1 F (35.6 C) (Oral)   Ht 5\' 4"  (1.626 m)   Wt 199 lb (90.3 kg)   LMP 04/14/1982   BMI 34.16 kg/m    Subjective:    Patient ID: Rita Payne, female    DOB: April 17, 1945, 71 y.o.   MRN: [id number]  HPI: Rita Payne is a 71 y.o. female presenting on 03/22/2016 for Back Pain; Urinary Urgency; and burning with urination  Dysuria and frequency of urine for several days. Has had history of UTI but only 2 times this year after having sling surgery.  Denies fever, chills, NVD,  Relevant past medical, surgical, family and social history reviewed and updated as indicated. Allergies and medications reviewed and updated.  Past Medical History:  Diagnosis Date  . Arthritis   . First degree heart block   . GERD (gastroesophageal reflux disease)   . History of kidney stones   . Hyperlipidemia   . Hypertension   . Mild intermittent asthma   . Mild sleep apnea    mild sleep apnea no cpap ordered/ study approx. 2005  . SUI (stress urinary incontinence, female)   . Type 2 diabetes mellitus ([place])     Past Surgical History:  Procedure Laterality Date  . ABDOMINAL HYSTERECTOMY  1987  . BELPHAROPTOSIS REPAIR Bilateral 2009   9   . CHOLECYSTECTOMY  1989  . HERNIA REPAIR    . kidney tumor removal   1986  . KNEE ARTHROSCOPY Bilateral   . LUMBAR LAMINECTOMY/DECOMPRESSION MICRODISCECTOMY N/A 12/08/2013   Procedure: LUMBAR DECOMPRESSION L4-L5,  L3-L4 ;  Surgeon: Johnn Hai, MD;  Location: [facility];  Service: Orthopedics;  Laterality: N/A;  . PUBOVAGINAL SLING N/A 10/03/2014   Procedure: Gaynelle Arabian;  Surgeon: Carolan Clines, MD;  Location: [facility];  Service: Urology;  Laterality: N/A;  . TONSILLECTOMY    . TOTAL HIP ARTHROPLASTY Right 08/25/2012   Procedure: RIGHT TOTAL HIP ARTHROPLASTY ANTERIOR APPROACH;  Surgeon: [name], MD;  Location: [facility];  Service: Orthopedics;  Laterality: Right;    Review of Systems    Constitutional: Negative.   HENT: Negative.   Eyes: Negative.   Respiratory: Negative.   Gastrointestinal: Negative.   Genitourinary: Positive for difficulty urinating, dysuria and urgency. Negative for flank pain.    Allergies as of 03/22/2016      Reactions   Ivp Dye [iodinated Diagnostic Agents] Anaphylaxis   Shellfish Allergy Anaphylaxis   Vasotec [enalapril] Shortness Of Breath   Adhesive [tape] Other (See Comments)   blisters   Iodine Other (See Comments)   Iodine that is applied to skin causes blisters    Pyridium [phenazopyridine Hcl]    blisters      Medication List       Accurate as of 03/22/16  4:37 PM. Always use your most recent med list.          aspirin 81 MG tablet Take 1 tablet (81 mg total) by mouth daily. Resume 4 days post-op   celecoxib 200 MG capsule Commonly known as:  CELEBREX TAKE 1 CAPSULE EVERY 12 HOURS   cetirizine 10 MG tablet Commonly known as:  ZYRTEC TAKE 1 TABLET DAILY   cholecalciferol 1000 units tablet Commonly known as:  VITAMIN D Take 1,000 Units by mouth daily.   fenofibrate 145 MG tablet Commonly known as:  TRICOR Take 1 tablet (145 mg total) by mouth daily.   Fluticasone-Salmeterol 250-50 MCG/DOSE Aepb Commonly  known as:  ADVAIR DISKUS USE 1 INHALATION EVERY 12 HOURS   glucose blood test strip Commonly known as:  FREESTYLE LITE Test qd. DX: E11.9   metFORMIN 500 MG tablet Commonly known as:  GLUCOPHAGE Take 1 tablet (500 mg total) by mouth 2 (two) times daily with a meal.   pantoprazole 40 MG tablet Commonly known as:  PROTONIX Take 1 tablet (40 mg total) by mouth daily.   PROAIR HFA 108 (90 Base) MCG/ACT inhaler Generic drug:  albuterol USE 2 INHALATIONS EVERY 6 HOURS AS NEEDED FOR WHEEZING   rosuvastatin 40 MG tablet Commonly known as:  CRESTOR TAKE 1 TABLET AT BEDTIME   sulfamethoxazole-trimethoprim 800-160 MG tablet Commonly known as:  BACTRIM DS,SEPTRA DS Take 1 tablet by mouth 2 (two) times  daily.   telmisartan-hydrochlorothiazide 80-25 MG tablet Commonly known as:  MICARDIS HCT Take 1 tablet by mouth every morning.   VAGIFEM 10 MCG Tabs vaginal tablet Generic drug:  Estradiol          Objective:    BP (!) 143/85   Pulse 97   Temp (!) 96.1 F (35.6 C) (Oral)   Ht 5\' 4"  (1.626 m)   Wt 199 lb (90.3 kg)   LMP 04/14/1982   BMI 34.16 kg/m   Allergies  Allergen Reactions  . Ivp Dye [Iodinated Diagnostic Agents] Anaphylaxis  . Shellfish Allergy Anaphylaxis  . Vasotec [Enalapril] Shortness Of Breath  . Adhesive [Tape] Other (See Comments)    blisters  . Iodine Other (See Comments)    Iodine that is applied to skin causes blisters   . Pyridium [Phenazopyridine Hcl]     blisters    Physical Exam  Constitutional: She is oriented to person, place, and time. She appears well-developed and well-nourished.  HENT:  Head: Normocephalic and atraumatic.  Eyes: Conjunctivae are normal. Pupils are equal, round, and reactive to light.  Cardiovascular: Normal rate, regular rhythm, normal heart sounds and intact distal pulses.   Pulmonary/Chest: Effort normal and breath sounds normal.  Abdominal: Soft. Bowel sounds are normal. She exhibits no distension and no mass. There is tenderness in the suprapubic area. There is no rebound, no guarding and no CVA tenderness.  Neurological: She is alert and oriented to person, place, and time. She has normal reflexes.  Skin: Skin is warm and dry. No rash noted.  Psychiatric: She has a normal mood and affect. Her behavior is normal. Judgment and thought content normal.    Results for orders placed or performed in visit on 02/13/16  [facility] DIABETES EYE EXAM  Result Value Ref Range   [facility] Diabetic Eye Exam Retinopathy (A) No Retinopathy      Assessment & Plan:   1. UTI symptoms - Urinalysis, Complete - sulfamethoxazole-trimethoprim (BACTRIM DS,SEPTRA DS) 800-160 MG tablet; Take 1 tablet by mouth 2 (two) times daily.  Dispense: 20 tablet;  Refill: 0 - Urine culture   Continue all other maintenance medications as listed above.  Follow up plan: Follow-up as needed or worsening of symptoms. Call office for any issues.   Educational handout given for cystitis  Terald Sleeper PA-C [facility] [address]  [place], [place] [address] [phone]   03/22/2016, 4:37 PM

## 2016-03-24 LAB — URINE CULTURE: Organism ID, Bacteria: NO GROWTH

## 2016-04-02 ENCOUNTER — Other Ambulatory Visit: Payer: Self-pay | Admitting: Nurse Practitioner

## 2016-04-02 DIAGNOSIS — E119 Type 2 diabetes mellitus without complications: Secondary | ICD-10-CM

## 2016-04-16 DIAGNOSIS — R8271 Bacteriuria: Secondary | ICD-10-CM | POA: Diagnosis not present

## 2016-04-16 DIAGNOSIS — N3020 Other chronic cystitis without hematuria: Secondary | ICD-10-CM | POA: Diagnosis not present

## 2016-04-23 ENCOUNTER — Encounter: Payer: Self-pay | Admitting: Nurse Practitioner

## 2016-04-23 ENCOUNTER — Other Ambulatory Visit: Payer: Self-pay

## 2016-04-23 ENCOUNTER — Ambulatory Visit (INDEPENDENT_AMBULATORY_CARE_PROVIDER_SITE_OTHER): Payer: Medicare Other | Admitting: Nurse Practitioner

## 2016-04-23 VITALS — BP 136/84 | HR 101 | Temp 97.0°F | Ht 64.0 in | Wt 200.0 lb

## 2016-04-23 DIAGNOSIS — J4520 Mild intermittent asthma, uncomplicated: Secondary | ICD-10-CM | POA: Diagnosis not present

## 2016-04-23 DIAGNOSIS — E119 Type 2 diabetes mellitus without complications: Secondary | ICD-10-CM

## 2016-04-23 DIAGNOSIS — M810 Age-related osteoporosis without current pathological fracture: Secondary | ICD-10-CM

## 2016-04-23 DIAGNOSIS — E559 Vitamin D deficiency, unspecified: Secondary | ICD-10-CM | POA: Diagnosis not present

## 2016-04-23 DIAGNOSIS — R300 Dysuria: Secondary | ICD-10-CM

## 2016-04-23 DIAGNOSIS — E785 Hyperlipidemia, unspecified: Secondary | ICD-10-CM | POA: Diagnosis not present

## 2016-04-23 DIAGNOSIS — Z6833 Body mass index (BMI) 33.0-33.9, adult: Secondary | ICD-10-CM | POA: Diagnosis not present

## 2016-04-23 DIAGNOSIS — M48061 Spinal stenosis, lumbar region without neurogenic claudication: Secondary | ICD-10-CM | POA: Diagnosis not present

## 2016-04-23 DIAGNOSIS — K219 Gastro-esophageal reflux disease without esophagitis: Secondary | ICD-10-CM | POA: Diagnosis not present

## 2016-04-23 DIAGNOSIS — N3000 Acute cystitis without hematuria: Secondary | ICD-10-CM

## 2016-04-23 DIAGNOSIS — I10 Essential (primary) hypertension: Secondary | ICD-10-CM

## 2016-04-23 DIAGNOSIS — M1990 Unspecified osteoarthritis, unspecified site: Secondary | ICD-10-CM

## 2016-04-23 DIAGNOSIS — Z1211 Encounter for screening for malignant neoplasm of colon: Secondary | ICD-10-CM

## 2016-04-23 DIAGNOSIS — Z1212 Encounter for screening for malignant neoplasm of rectum: Secondary | ICD-10-CM

## 2016-04-23 LAB — URINALYSIS, COMPLETE
Bilirubin, UA: NEGATIVE
Glucose, UA: NEGATIVE
Ketones, UA: NEGATIVE
NITRITE UA: POSITIVE — AB
Specific Gravity, UA: 1.01 (ref 1.005–1.030)
UUROB: 2 mg/dL — AB (ref 0.2–1.0)
pH, UA: 6 (ref 5.0–7.5)

## 2016-04-23 LAB — MICROSCOPIC EXAMINATION
Epithelial Cells (non renal): >10 /hpf — AB (ref 0–10)
Renal Epithel, UA: NONE SEEN /hpf

## 2016-04-23 LAB — BAYER DCA HB A1C WAIVED: HB A1C: 7.5 % — AB (ref <7.0)

## 2016-04-23 MED ORDER — GLIMEPIRIDE 2 MG PO TABS: 2.0000 mg | ORAL_TABLET | Freq: Every day | ORAL | 5 refills | Status: DC

## 2016-04-23 MED ORDER — CIPROFLOXACIN HCL 500 MG PO TABS: 500.0000 mg | ORAL_TABLET | Freq: Two times a day (BID) | ORAL | 0 refills | Status: DC

## 2016-04-23 MED ORDER — UROGESIC-BLUE 81.6 MG PO TABS: 1.0000 | ORAL_TABLET | Freq: Three times a day (TID) | ORAL | 0 refills | Status: DC | PRN

## 2016-04-23 MED ORDER — GLIMEPIRIDE 2 MG PO TABS: 2.0000 mg | ORAL_TABLET | Freq: Every day | ORAL | 1 refills | Status: DC

## 2016-04-23 NOTE — Patient Instructions (Signed)
Carbohydrate Counting for Diabetes Mellitus, Adult Carbohydrate counting is a method for keeping track of how many carbohydrates you eat. Eating carbohydrates naturally increases the amount of sugar (glucose) in the blood. Counting how many carbohydrates you eat helps keep your blood glucose within normal limits, which helps you manage your diabetes (diabetes mellitus). It is important to know how many carbohydrates you can safely have in each meal. This is different for every person. A diet and nutrition specialist (registered dietitian) can help you make a meal plan and calculate how many carbohydrates you should have at each meal and snack. Carbohydrates are found in the following foods:  Grains, such as breads and cereals.  Dried beans and soy products.  Starchy vegetables, such as potatoes, peas, and corn.  Fruit and fruit juices.  Milk and yogurt.  Sweets and snack foods, such as cake, cookies, candy, chips, and soft drinks. How do I count carbohydrates? There are two ways to count carbohydrates in food. You can use either of the methods or a combination of both. Reading "Nutrition Facts" on packaged food  The "Nutrition Facts" list is included on the labels of almost all packaged foods and beverages in the [place] It includes:  The serving size.  Information about nutrients in each serving, including the grams (g) of carbohydrate per serving. To use the "Nutrition Facts":  Decide how many servings you will have.  Multiply the number of servings by the number of carbohydrates per serving.  The resulting number is the total amount of carbohydrates that you will be having. Learning standard serving sizes of other foods  When you eat foods containing carbohydrates that are not packaged or do not include "Nutrition Facts" on the label, you need to measure the servings in order to count the amount of carbohydrates:  Measure the foods that you will eat with a food scale or measuring  cup, if needed.  Decide how many standard-size servings you will eat.  Multiply the number of servings by 15. Most carbohydrate-rich foods have about 15 g of carbohydrates per serving.  For example, if you eat 8 oz (170 g) of strawberries, you will have eaten 2 servings and 30 g of carbohydrates (2 servings x 15 g = 30 g).  For foods that have more than one food mixed, such as soups and casseroles, you must count the carbohydrates in each food that is included. The following list contains standard serving sizes of common carbohydrate-rich foods. Each of these servings has about 15 g of carbohydrates:   hamburger bun or  English muffin.   oz (15 mL) syrup.   oz (14 g) jelly.  1 slice of bread.  1 six-inch tortilla.  3 oz (85 g) cooked rice or pasta.  4 oz (113 g) cooked dried beans.  4 oz (113 g) starchy vegetable, such as peas, corn, or potatoes.  4 oz (113 g) hot cereal.  4 oz (113 g) mashed potatoes or  of a large baked potato.  4 oz (113 g) canned or frozen fruit.  4 oz (120 mL) fruit juice.  4-6 crackers.  6 chicken nuggets.  6 oz (170 g) unsweetened dry cereal.  6 oz (170 g) plain fat-free yogurt or yogurt sweetened with artificial sweeteners.  8 oz (240 mL) milk.  8 oz (170 g) fresh fruit or one small piece of fruit.  24 oz (680 g) popped popcorn. Example of carbohydrate counting Sample meal  3 oz (85 g) chicken breast.  6 oz (  170 g) brown rice.  4 oz (113 g) corn.  8 oz (240 mL) milk.  8 oz (170 g) strawberries with sugar-free whipped topping. Carbohydrate calculation 1. Identify the foods that contain carbohydrates:  Rice.  Corn.  Milk.  Strawberries. 2. Calculate how many servings you have of each food:  2 servings rice.  1 serving corn.  1 serving milk.  1 serving strawberries. 3. Multiply each number of servings by 15 g:  2 servings rice x 15 g = 30 g.  1 serving corn x 15 g = 15 g.  1 serving milk x 15 g = 15  g.  1 serving strawberries x 15 g = 15 g. 4. Add together all of the amounts to find the total grams of carbohydrates eaten:  30 g + 15 g + 15 g + 15 g = 75 g of carbohydrates total. This information is not intended to replace advice given to you by your health care provider. Make sure you discuss any questions you have with your health care provider. Document Released: 12/24/2004 Document Revised: 07/14/2015 Document Reviewed: 06/07/2015 [employer] Interactive Patient Education  2017 [employer]  

## 2016-04-23 NOTE — Progress Notes (Signed)
Subjective:    Patient ID: Rita Payne, female    DOB: [birth year], 71 y.o.   MRN: [id number]  HPI  Rita Payne is here today for follow up of chronic medical problem.  Outpatient Encounter Prescriptions as of 04/23/2016  Medication Sig  . aspirin 81 MG tablet Take 1 tablet (81 mg total) by mouth daily. Resume 4 days post-op  . celecoxib (CELEBREX) 200 MG capsule TAKE 1 CAPSULE EVERY 12 HOURS  . cetirizine (ZYRTEC) 10 MG tablet TAKE 1 TABLET DAILY  . cholecalciferol (VITAMIN D) 1000 UNITS tablet Take 1,000 Units by mouth daily.  . fenofibrate (TRICOR) 145 MG tablet Take 1 tablet (145 mg total) by mouth daily. (Patient not taking: Reported on 03/22/2016)  . Fluticasone-Salmeterol (ADVAIR DISKUS) 250-50 MCG/DOSE AEPB USE 1 INHALATION EVERY 12 HOURS  . glucose blood (FREESTYLE LITE) test strip Test qd. DX: E11.9  . metFORMIN (GLUCOPHAGE) 500 MG tablet TAKE 1 TABLET TWICE A DAY WITH MEALS  . pantoprazole (PROTONIX) 40 MG tablet Take 1 tablet (40 mg total) by mouth daily.  Marland Kitchen PROAIR HFA 108 (90 Base) MCG/ACT inhaler USE 2 INHALATIONS EVERY 6 HOURS AS NEEDED FOR WHEEZING  . rosuvastatin (CRESTOR) 40 MG tablet TAKE 1 TABLET AT BEDTIME  . sulfamethoxazole-trimethoprim (BACTRIM DS,SEPTRA DS) 800-160 MG tablet Take 1 tablet by mouth 2 (two) times daily.  Marland Kitchen telmisartan-hydrochlorothiazide (MICARDIS HCT) 80-25 MG tablet Take 1 tablet by mouth every morning.  Marland Kitchen VAGIFEM 10 MCG TABS vaginal tablet      1. Essential hypertension  No chest pain,HA or SOB- does not check blood pressures at home  2. Mild intermittent chronic asthma without complication  Is currently on asthma daily- no recent flare ups- usually starts to flare up this time of year with all of the pollen in air  3. Gastroesophageal reflux disease without esophagitis  Currently on protonix which works well to Keep symptoms under control  4. Type 2 diabetes mellitus without complication, without long-term current use of insulin (HCC)  currently on metformin- does not check blood sugar everyday. No hypoglycemia episoes that she is aware of. Last Hgba1c was 7.1%  5. Arthritis  Is on celebrex- always has joint pain -wrse in knees and hips  6. Senile osteoporosis  Currently on no meds-does very little weight bearing exrcise  7. BMI 33.0-33.9,adult  No rcent weight gain or weight loss  8. Hyperlipidemia with target LDL less than 100  Is taking tricor and crestor- does not watch diet very closely  9. Spinal stenosis of lumbar region at multiple levels  Always has back pain- celebrex helps  10.    Vitamin d def          Suppose to take vitamin d OTC daily- forgets to tae sometimes  New complaints: She is c/o dysuria today- has had for several days- denies urgency and frequency     Review of Systems  Constitutional: Negative for diaphoresis.  HENT: Negative.   Eyes: Negative for pain.  Respiratory: Negative for shortness of breath.   Cardiovascular: Negative for chest pain, palpitations and leg swelling.  Gastrointestinal: Negative for abdominal pain.  Endocrine: Negative for polydipsia.  Genitourinary: Positive for dysuria. Negative for flank pain, frequency, pelvic pain and urgency.  Skin: Negative for rash.  Neurological: Negative for dizziness, weakness and headaches.  Hematological: Does not bruise/bleed easily.  All other systems reviewed and are negative.      Objective:   Physical Exam  Constitutional: She is oriented to person, place, and  time. She appears well-developed and well-nourished.  HENT:  Nose: Nose normal.  Mouth/Throat: Oropharynx is clear and moist.  Eyes: EOM are normal.  Neck: Trachea normal, normal range of motion and full passive range of motion without pain. Neck supple. No JVD present. Carotid bruit is not present. No thyromegaly present.  Cardiovascular: Normal rate, regular rhythm, normal heart sounds and intact distal pulses.  Exam reveals no gallop and no friction rub.   No  murmur heard. Pulmonary/Chest: Effort normal and breath sounds normal.  Abdominal: Soft. Bowel sounds are normal. She exhibits no distension and no mass. There is tenderness (mild suprapubic pain on palpation).  Genitourinary:  Genitourinary Comments: No cva tenderness  Musculoskeletal: Normal range of motion.  Finger joints swollen bil thumb index and ring finger- pain on full flexion  Lymphadenopathy:    She has no cervical adenopathy.  Neurological: She is alert and oriented to person, place, and time. She has normal reflexes.  Skin: Skin is warm and dry.  Psychiatric: She has a normal mood and affect. Her behavior is normal. Judgment and thought content normal.    BP 136/84   Pulse (!) 101   Temp 97 F (36.1 C) (Oral)   Ht _0  (1.626 m)   Wt 200 lb (90.7 kg)   LMP 04/14/1982   BMI 34.33 kg/m   UA- positive nitrites HGBA1c 7.5%      Assessment & Plan:  1. Essential hypertension Low sodium diet - CMP14+EGFR - Lipid panel  2. Mild intermittent chronic asthma without complication Avoid allergens  3. Gastroesophageal reflux disease without esophagitis Avoid spicy foods Do not eat 2 hours prior to bedtime  4. Type 2 diabetes mellitus without complication, without long-term current use of insulin (HCC) Stricter carb counting Added amaryl to meds- watch for hypoglycemia Continue  To keep diary of daily fasting blood sugars - Bayer DCA Hb A1c Waived - glimepiride (AMARYL) 2 MG tablet; Take 1 tablet (2 mg total) by mouth daily with breakfast.  Dispense: 30 tablet; Refill: 5  5. Arthritis Labs pending-- will do referral to rheumatologist if sed rate elevated - Arthritis Panel  6. Senile osteoporosis Weight bearing exercises encouraged  7. BMI 34.0-34.9,adult Discussed diet and exercise for person with BMI >25 Will recheck weight in 3-6 months  8. Hyperlipidemia with target LDL less than 100 Low fat diet  9. Spinal stenosis of lumbar region at multiple  levels   10. Vitamin D deficiency Continue daily vitamin d   40. Dysuria - Urinalysis, Complete  12. Acute cystitis without hematuria Take medication as prescribe Cotton underwear Take shower not bath Cranberry juice, yogurt Force fluids AZO over the counter X2 days Culture pending RTO prn - Methen-Hyosc-Meth Blue-Na Phos (UROGESIC-BLUE) 81.6 MG TABS; Take 1 tablet (81.6 mg total) by mouth 3 (three) times daily as needed.  Dispense: 6 tablet; Refill: 0 - ciprofloxacin (CIPRO) 500 MG tablet; Take 1 tablet (500 mg total) by mouth 2 (two) times daily.  Dispense: 20 tablet; Refill: 0 - Urine culture  13. Screening for colorectal cancer - Fecal occult blood, imunochemical; Future    Labs pending Health maintenance reviewed Diet and exercise encouraged Continue all meds Follow up  In 3 months   [place], FNP

## 2016-04-24 LAB — LIPID PANEL
CHOLESTEROL TOTAL: 149 mg/dL (ref 100–199)
Chol/HDL Ratio: 4.5 ratio — ABNORMAL HIGH (ref 0.0–4.4)
HDL: 33 mg/dL — AB (ref >39)
LDL Calculated: 42 mg/dL (ref 0–99)
TRIGLYCERIDES: 370 mg/dL — AB (ref 0–149)
VLDL Cholesterol Cal: 74 mg/dL — ABNORMAL HIGH (ref 5–40)

## 2016-04-24 LAB — ARTHRITIS PANEL
BASOS: 0 %
Basophils Absolute: 0 10*3/uL (ref 0.0–0.2)
EOS (ABSOLUTE): 0.1 10*3/uL (ref 0.0–0.4)
EOS: 2 %
Hematocrit: 47.1 % — ABNORMAL HIGH (ref 34.0–46.6)
Hemoglobin: 15.7 g/dL (ref 11.1–15.9)
IMMATURE GRANULOCYTES: 0 %
Immature Grans (Abs): 0 10*3/uL (ref 0.0–0.1)
LYMPHS: 36 %
Lymphocytes Absolute: 1.8 10*3/uL (ref 0.7–3.1)
MCH: 28.6 pg (ref 26.6–33.0)
MCHC: 33.3 g/dL (ref 31.5–35.7)
MCV: 86 fL (ref 79–97)
Monocytes Absolute: 0.5 10*3/uL (ref 0.1–0.9)
Monocytes: 10 %
NEUTROS PCT: 52 %
Neutrophils Absolute: 2.6 10*3/uL (ref 1.4–7.0)
Platelets: 219 10*3/uL (ref 150–379)
RBC: 5.49 x10E6/uL — AB (ref 3.77–5.28)
RDW: 13.5 % (ref 12.3–15.4)
Rhuematoid fact SerPl-aCnc: <10 IU/mL (ref 0.0–13.9)
Sed Rate: 2 mm/hr (ref 0–40)
Uric Acid: 5.6 mg/dL (ref 2.5–7.1)
WBC: 5 10*3/uL (ref 3.4–10.8)

## 2016-04-24 LAB — CMP14+EGFR
A/G RATIO: 1.6 (ref 1.2–2.2)
ALT: 53 IU/L — AB (ref 0–32)
AST: 50 IU/L — AB (ref 0–40)
Albumin: 4.4 g/dL (ref 3.6–4.8)
Alkaline Phosphatase: 76 IU/L (ref 39–117)
BUN/Creatinine Ratio: 26 (ref 12–28)
BUN: 17 mg/dL (ref 8–27)
Bilirubin Total: 0.4 mg/dL (ref 0.0–1.2)
CALCIUM: 10.1 mg/dL (ref 8.7–10.3)
CHLORIDE: 96 mmol/L (ref 96–106)
CO2: 25 mmol/L (ref 18–29)
Creatinine, Ser: 0.65 mg/dL (ref 0.57–1.00)
GFR calc Af Amer: 105 mL/min/{1.73_m2} (ref >59)
GFR calc non Af Amer: 92 mL/min/{1.73_m2} (ref >59)
Globulin, Total: 2.7 g/dL (ref 1.5–4.5)
Glucose: 219 mg/dL — ABNORMAL HIGH (ref 65–99)
POTASSIUM: 4.9 mmol/L (ref 3.5–5.2)
SODIUM: 139 mmol/L (ref 134–144)
Total Protein: 7.1 g/dL (ref 6.0–8.5)

## 2016-04-25 LAB — URINE CULTURE

## 2016-05-26 ENCOUNTER — Other Ambulatory Visit: Payer: Self-pay | Admitting: Nurse Practitioner

## 2016-06-02 ENCOUNTER — Other Ambulatory Visit: Payer: Self-pay | Admitting: Nurse Practitioner

## 2016-06-05 ENCOUNTER — Ambulatory Visit (INDEPENDENT_AMBULATORY_CARE_PROVIDER_SITE_OTHER): Payer: Medicare Other | Admitting: *Deleted

## 2016-06-05 VITALS — BP 111/72 | HR 87 | Ht 64.5 in | Wt 197.0 lb

## 2016-06-05 DIAGNOSIS — Z0000 Encounter for general adult medical examination without abnormal findings: Secondary | ICD-10-CM | POA: Diagnosis not present

## 2016-06-05 NOTE — Patient Instructions (Addendum)
  Rita Payne , Thank you for taking time to come for your Medicare Wellness Visit. I appreciate your ongoing commitment to your health goals. Please review the following plan we discussed and let me know if I can assist you in the future.   These are the goals we discussed: Exercise for 30 minutes at least 3 times per   Return stool card at your next appointment.   Keep follow up appointment in July.  Your mammogram is scheduled for 12/06/16 at 8:30 on the mobile unit at our office.    This is a list of the screening recommended for you and due dates:  Health Maintenance  Topic Date Due  . Stool Blood Test  04/29/1997  . Mammogram  10/23/2016*  . Hemoglobin A1C  07/23/2016  . Eye exam for diabetics  07/23/2016  . Flu Shot  08/07/2016  . Complete foot exam   01/10/2017  . Tetanus Vaccine  07/14/2017  . DEXA scan (bone density measurement)  Completed  .  Hepatitis C: One time screening is recommended by Center for Disease Control  (CDC) for  adults born from [birth year] through 1963.   Completed  . Pneumonia vaccines  Completed  *Topic was postponed. The date shown is not the original due date.

## 2016-06-05 NOTE — Progress Notes (Signed)
Subjective:   Rita Payne is a 71 y.o. female who presents for a Subsequent [employer] Visit. Rita Payne is widowed and normally lives alone. She has 3 indoor dogs. Right now she is taking care of her sick brother and he is living with her. She is uncomfortable leaving him alone because of his health and she doesn't have a lot of help with him. 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 She enjoys reading and reads about one book a day. She has one daughter, one son, and 2 grandchildren.   Review of Systems    Musculoskeletal: right hip pain and lip after walking  Cardiac Risk Factors include: advanced age (>62men, >34 women);diabetes mellitus;dyslipidemia;family history of premature cardiovascular disease;hypertension;female gender;obesity (BMI >30kg/m2);sedentary lifestyle;smoking/ tobacco exposure  Endocrine: Blood sugar is running 170-210 in the morning. It comes down after taking Amaryl with breakfast.   Her health is about the same as last year.  Other systems negative Objective:    Today's Vitals   06/05/16 0945  BP: 111/72  Pulse: 87  Weight: 197 lb (89.4 kg)  Height: 5' 4.5" (1.638 m)   Body mass index is 33.29 kg/m.   Current Medications (verified) Outpatient Encounter Prescriptions as of 06/05/2016  Medication Sig  . aspirin 81 MG tablet Take 1 tablet (81 mg total) by mouth daily. Resume 4 days post-op  . celecoxib (CELEBREX) 200 MG capsule TAKE 1 CAPSULE EVERY 12 HOURS  . cetirizine (ZYRTEC) 10 MG tablet TAKE 1 TABLET DAILY  . cholecalciferol (VITAMIN D) 1000 UNITS tablet Take 1,000 Units by mouth daily.  . Fluticasone-Salmeterol (ADVAIR DISKUS) 250-50 MCG/DOSE AEPB USE 1 INHALATION EVERY 12 HOURS  . glimepiride (AMARYL) 2 MG tablet Take 1 tablet (2 mg total) by mouth daily with breakfast.  . glucose blood (FREESTYLE LITE) test strip Test qd. DX: E11.9  . metFORMIN (GLUCOPHAGE) 500 MG tablet TAKE 1 TABLET TWICE A DAY WITH MEALS  .  Methen-Hyosc-Meth Blue-Na Phos (UROGESIC-BLUE) 81.6 MG TABS Take 1 tablet (81.6 mg total) by mouth 3 (three) times daily as needed.  . pantoprazole (PROTONIX) 40 MG tablet Take 1 tablet (40 mg total) by mouth daily.  Marland Kitchen PROAIR HFA 108 (90 Base) MCG/ACT inhaler USE 2 INHALATIONS EVERY 6 HOURS AS NEEDED FOR WHEEZING  . rosuvastatin (CRESTOR) 40 MG tablet TAKE 1 TABLET AT BEDTIME  . telmisartan-hydrochlorothiazide (MICARDIS HCT) 80-25 MG tablet Take 1 tablet by mouth every morning.  Marland Kitchen VAGIFEM 10 MCG TABS vaginal tablet   . fenofibrate (TRICOR) 145 MG tablet Take 1 tablet (145 mg total) by mouth daily. (Patient not taking: Reported on 06/05/2016)  . [DISCONTINUED] ciprofloxacin (CIPRO) 500 MG tablet Take 1 tablet (500 mg total) by mouth 2 (two) times daily.   Facility-Administered Encounter Medications as of 06/05/2016  Medication  . magnesium citrate solution 1 Bottle  . sodium phosphate (FLEET) 7-19 GM/118ML enema 1 enema    Allergies (verified) Ivp dye [iodinated diagnostic agents]; Shellfish allergy; Vasotec [enalapril]; Adhesive [tape]; Iodine; and Pyridium [phenazopyridine hcl]   History: Past Medical History:  Diagnosis Date  . Arthritis   . Basal cell carcinoma   . First degree heart block   . GERD (gastroesophageal reflux disease)   . History of kidney stones   . Hyperlipidemia   . Hypertension   . Melanoma ([place])   . Mild intermittent asthma   . Mild sleep apnea    mild sleep apnea no cpap ordered/ study approx. 2005  .  SUI (stress urinary incontinence, female)   . Type 2 diabetes mellitus ([place])    Past Surgical History:  Procedure Laterality Date  . ABDOMINAL HYSTERECTOMY  1987  . BELPHAROPTOSIS REPAIR Bilateral 2009   9   . CHOLECYSTECTOMY  1989  . HERNIA REPAIR    . kidney tumor removal   1986  . KNEE ARTHROSCOPY Bilateral   . LUMBAR LAMINECTOMY/DECOMPRESSION MICRODISCECTOMY N/A 12/08/2013   Procedure: LUMBAR DECOMPRESSION L4-L5,  L3-L4 ;  Surgeon: Johnn Hai,  MD;  Location: [facility];  Service: Orthopedics;  Laterality: N/A;  . PUBOVAGINAL SLING N/A 10/03/2014   Procedure: Gaynelle Arabian;  Surgeon: Carolan Clines, MD;  Location: [facility];  Service: Urology;  Laterality: N/A;  . TONSILLECTOMY    . TOTAL HIP ARTHROPLASTY Right 08/25/2012   Procedure: RIGHT TOTAL HIP ARTHROPLASTY ANTERIOR APPROACH;  Surgeon: [name], MD;  Location: [facility];  Service: Orthopedics;  Laterality: Right;   Family History  Problem Relation Age of Onset  . Diabetes Mother   . Heart disease Mother   . Stroke Mother   . Heart attack Mother   . Heart disease Father   . Stroke Father   . Vision loss Brother   . Arthritis/Rheumatoid Brother   . Diabetes Sister   . Heart disease Sister   . Heart failure Sister   . Diabetes Sister   . Stroke Sister   . Heart disease Brother   . Heart failure Brother   . Heart attack Brother   . Arrhythmia Brother   . Arthritis/Rheumatoid Sister    Social History   Occupational History  . Not on file.   Social History Main Topics  . Smoking status: Current Every Day Smoker    Packs/day: 0.25    Years: 25.00    Types: Cigarettes  . Smokeless tobacco: Never Used  . Alcohol use No  . Drug use: No  . Sexual activity: No    Tobacco Counseling Ready to quit: Yes Counseling given: Yes Patient has tried and failed patches and gum in the past. She used patches for about 1 month and was unable to tolerate the gum. She is interested in Chantix and will talk with her PCP at her next visit.   Activities of Daily Living In your present state of health, do you have any difficulty performing the following activities: 06/05/2016  Hearing? N  Vision? Y  Difficulty concentrating or making decisions? N  Walking or climbing stairs? N  Dressing or bathing? N  Doing errands, shopping? N  Preparing Food and eating ? N  Using the Toilet? N  In the past six months, have you accidently leaked urine? N  Do you have  problems with loss of bowel control? N  Managing your Medications? N  Managing your Finances? N  Housekeeping or managing your Housekeeping? N  Some recent data might be hidden    Immunizations and Health Maintenance Immunization History  Administered Date(s) Administered  . Influenza,inj,Quad PF,36+ Mos 10/21/2012, 11/11/2013, 12/23/2014, 10/05/2015  . Pneumococcal Conjugate-13 06/01/2014  . Pneumococcal Polysaccharide-23 07/14/2012  . Tdap 07/15/2007   Health Maintenance Due  Topic Date Due  . COLON CANCER SCREENING ANNUAL FOBT  04/29/1997    Patient Care Team: Chevis Pretty, FNP as PCP - General (Family Medicine) Paralee Cancel, MD as Consulting Physician (Orthopedic Surgery) Susa Day, MD as Consulting Physician (Orthopedic Surgery) Sandford Craze, MD as Referring Physician (Dermatology) Carolan Clines, MD as Consulting Physician (Urology)  No recent hospitalizations,  surgeries, or ER visits.      Assessment:   This is a routine wellness examination for Rita Payne.   Hearing/Vision screen No deficit noted during visit  Dietary issues and exercise activities discussed: Current Exercise Habits: The patient does not participate in regular exercise at present (Patient was going to the gym daily until 11/2015 when she started taking care of her brother), Exercise limited by: orthopedic condition(s) (Left hip pain)  She doesn't have a good place to walk at home.  Typically eats 3 home prepared meals a day.  Goals    . Exercise 150 minutes per week (moderate activity)          Try to exercise for 30 minutes at least 3 times per week.      Depression Screen PHQ 2/9 Scores 06/05/2016 04/23/2016 03/22/2016 01/11/2016 10/05/2015 07/12/2015 07/03/2015  PHQ - 2 Score 0 0 0 0 0 0 0  PHQ- 9 Score - - - - - - -    Fall Risk Fall Risk  06/05/2016 04/23/2016 03/22/2016 01/11/2016 10/05/2015  Falls in the past year? No No No No No  Number falls in past yr: - - - - -    Injury with Fall? - - - - -  Risk for fall due to : History of fall(s) - - - -  Risk for fall due to (comments): - - - - -    Cognitive Function: MMSE - Mini Mental State Exam 06/05/2016  Orientation to time 5  Orientation to Place 5  Registration 3  Attention/ Calculation 5  Recall 3  Language- name 2 objects 2  Language- repeat 1  Language- follow 3 step command 3  Language- read & follow direction 1  Write a sentence 1  Copy design 1  Total score 30  Normal Test      Screening Tests Health Maintenance  Topic Date Due  . COLON CANCER SCREENING ANNUAL FOBT  04/29/1997  . MAMMOGRAM  10/23/2016 (Originally 03/07/2016)  . HEMOGLOBIN A1C  07/23/2016  . OPHTHALMOLOGY EXAM  07/23/2016  . INFLUENZA VACCINE  08/07/2016  . FOOT EXAM  01/10/2017  . TETANUS/TDAP  07/14/2017  . DEXA SCAN  Completed  . Hepatitis C Screening  Completed  . PNA vac Low Risk Adult  Completed      Plan:  Keep follow up appt with Chevis Pretty, FNP on 07/29/16 Schedule eye exam with [facility] for July 2018. Have them send [place] a report. Mammogram scheduled on mobile unit for 12/06/16 at 8:30 Discussed smoking cessation. May be interested in Chantix. She will speak with provider about this at appt. Bring a copy of Advance Directives for our records Return FOBT Encouraged her to take care of herself and not to let her emotional and physical health suffer while caring for her brother  I have personally reviewed and noted the following in the patient's chart:   . Medical and social history . Use of alcohol, tobacco or illicit drugs  . Current medications and supplements . Functional ability and status . Nutritional status . Physical activity . Advanced directives . List of other physicians . Hospitalizations, surgeries, and ER visits in previous 12 months . Vitals . Screenings to include cognitive, depression, and falls . Referrals and appointments  In addition, I have reviewed and  discussed with patient certain preventive protocols, quality metrics, and best practice recommendations. A written personalized care plan for preventive services as well as general preventive health recommendations were provided to  patient.     Chong Sicilian, RN   06/05/2016    I have reviewed and agree with the above AWV documentation.   Assunta Found, MD [facility] 06/05/2016, 1:13 PM

## 2016-06-22 IMAGING — CR DG CHEST 2V
2 series · 2 of 2 positions shown · non-contrast
Comparison: PA and lateral chest 08/14/2012.

CLINICAL DATA: Preoperative films.  Hypertension.

EXAM:
CHEST  2 VIEW

[w chest pa]
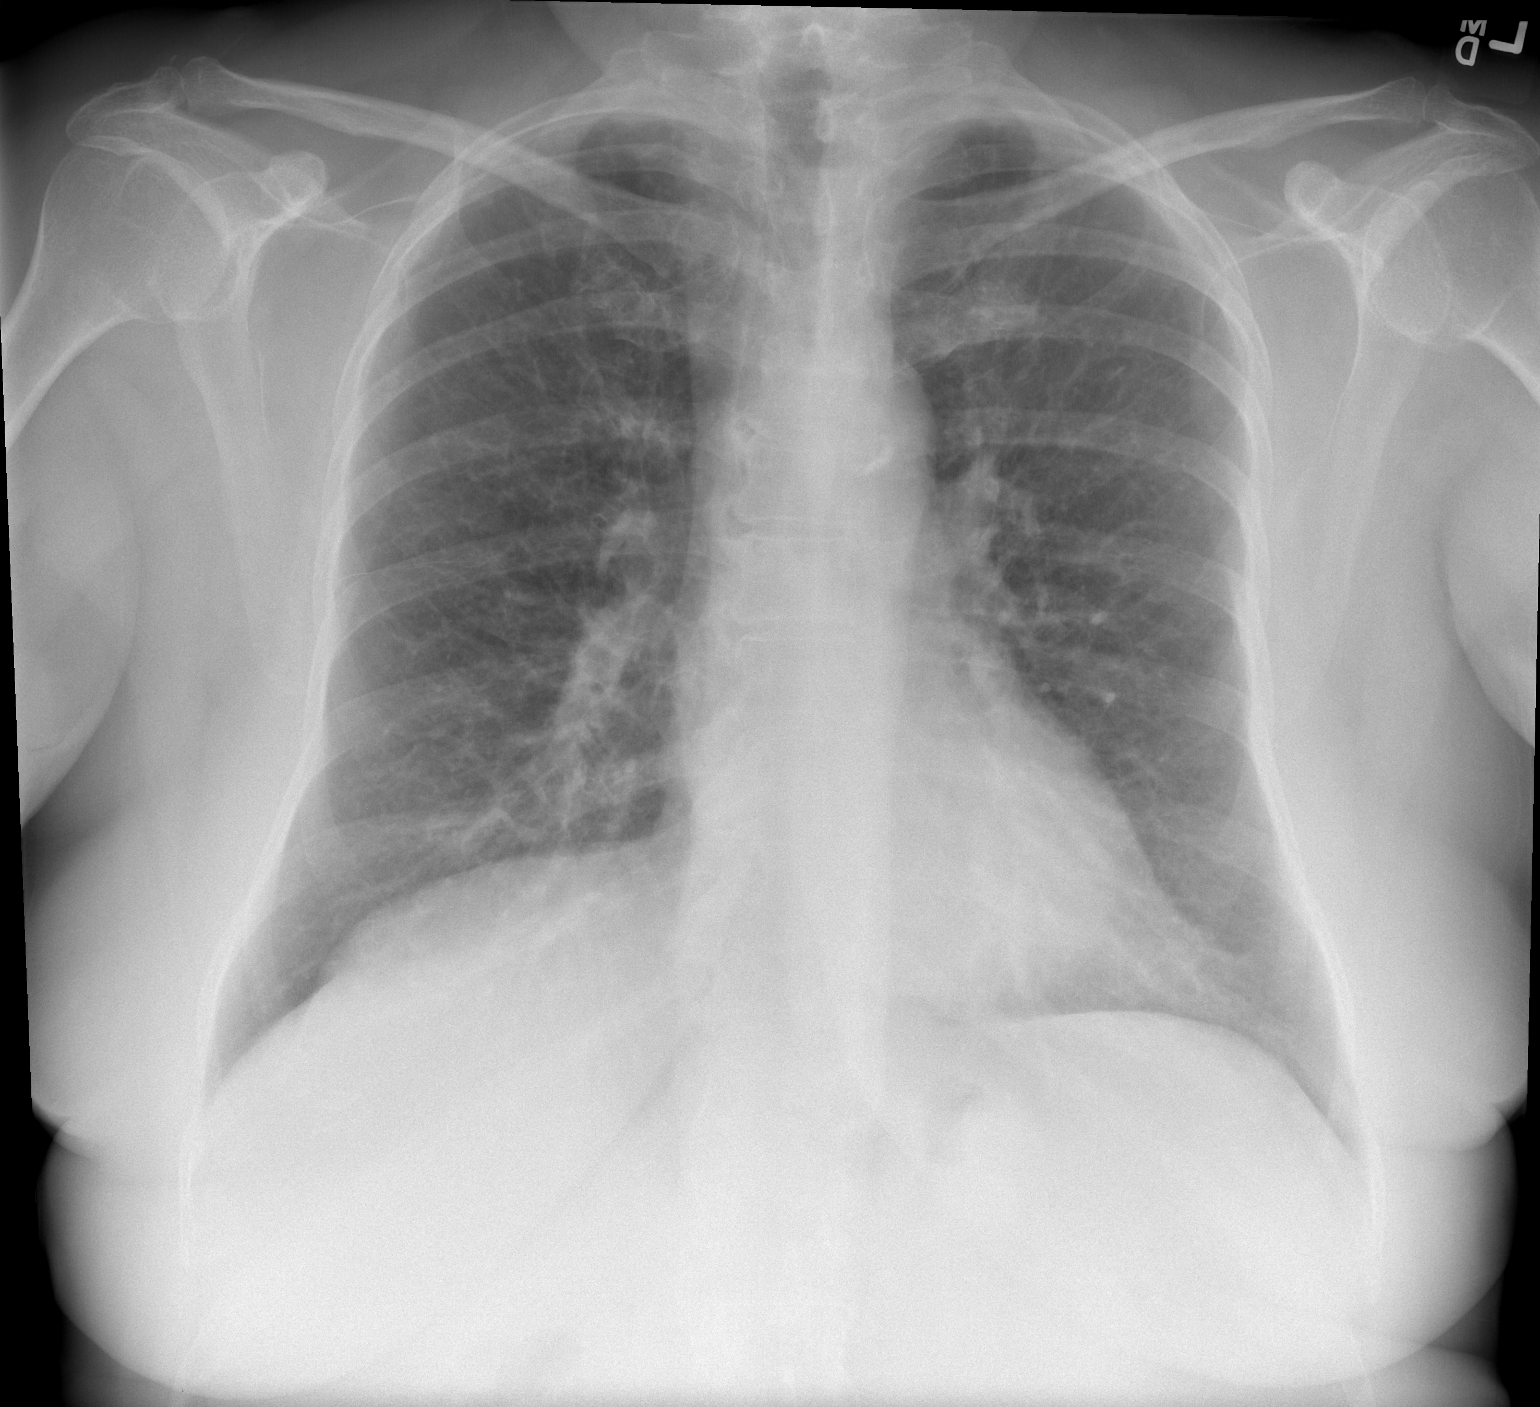

[w chest lat]
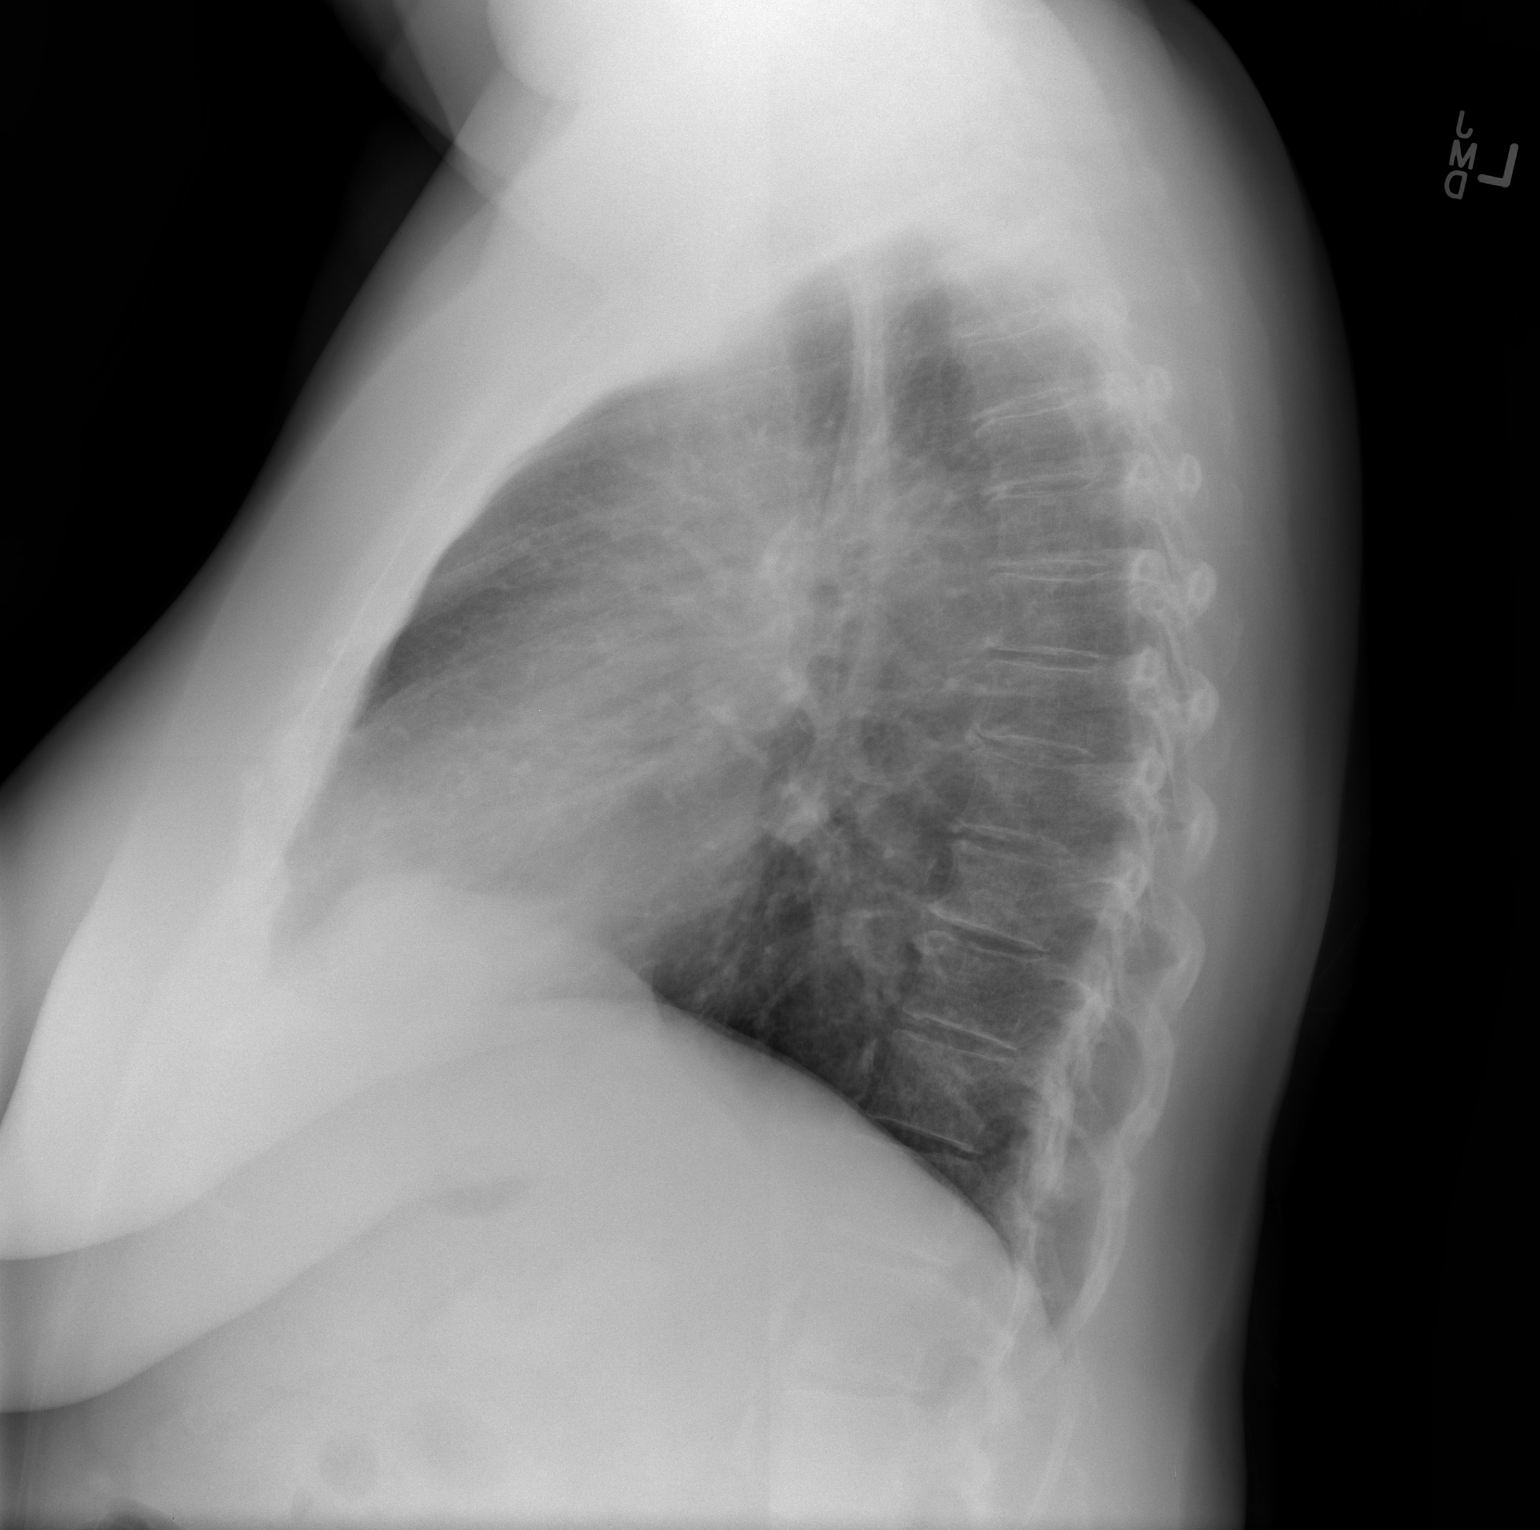

[2 of 2 positions shown; findings below may reference images not displayed]

FINDINGS: Mild eventration of the anterior right hemidiaphragm is unchanged.
The lungs are clear. Heart size is normal. No pneumothorax or
pleural effusion.
IMPRESSION: No acute disease.

## 2016-06-22 IMAGING — CR DG LUMBAR SPINE 2-3V
3 series · 3 of 3 positions shown · non-contrast
Comparison: Lumbar spine MRI of October 22, 2013

CLINICAL DATA: Preoperative exam prior to lumbar surgery; history
of spinal stenosis

EXAM:
LUMBAR SPINE - 2-3 VIEW

[t l-spine a.p.]
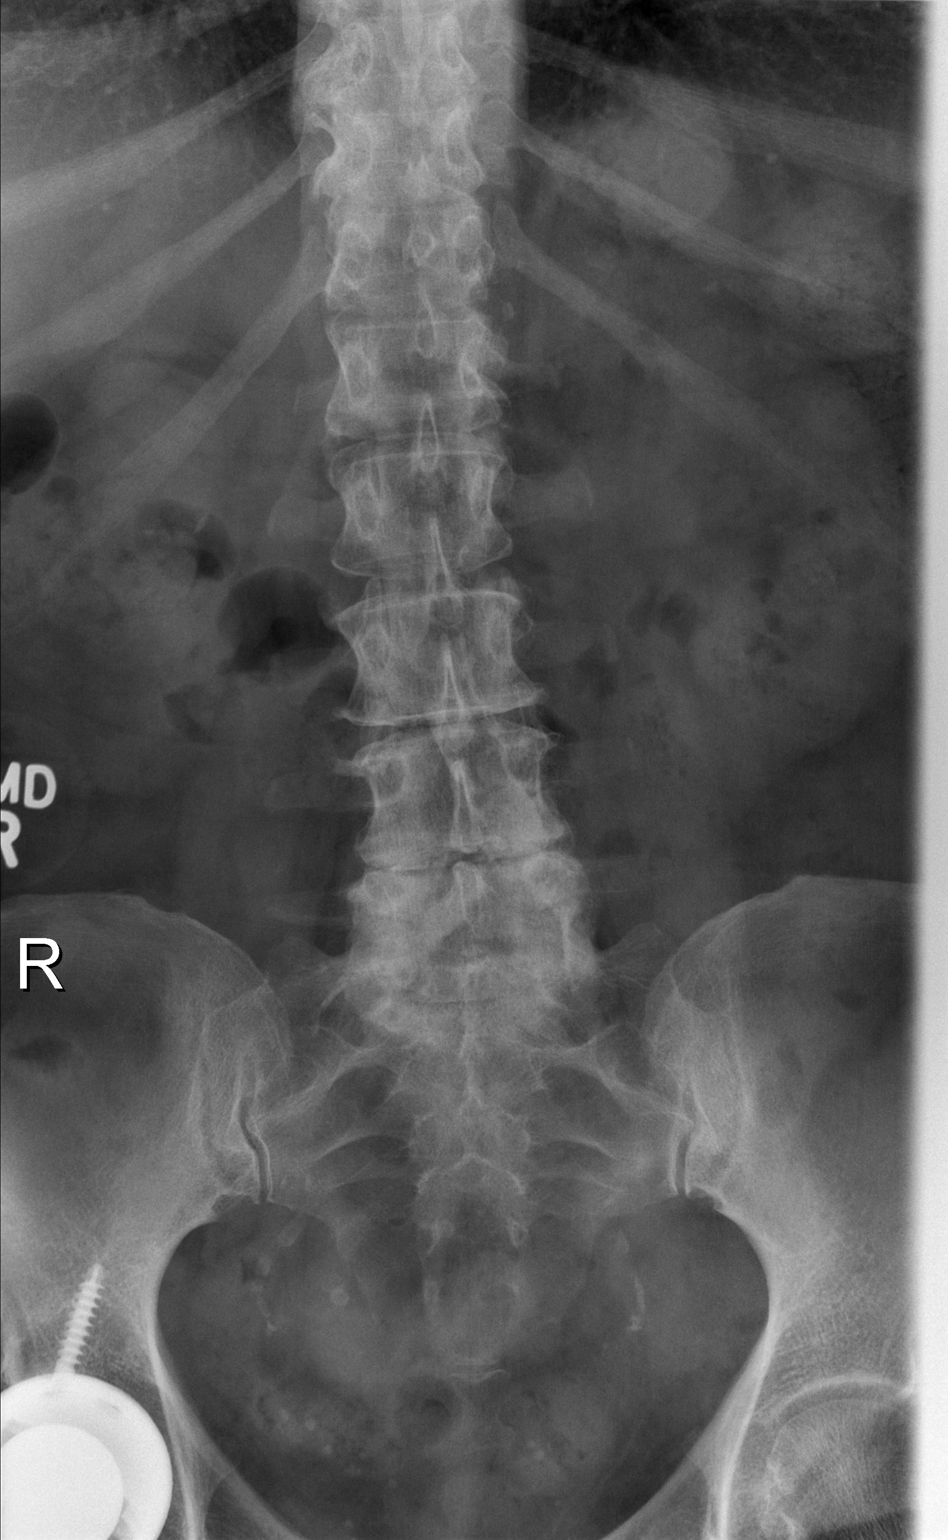

[t l-spine lat (1 of 2)]
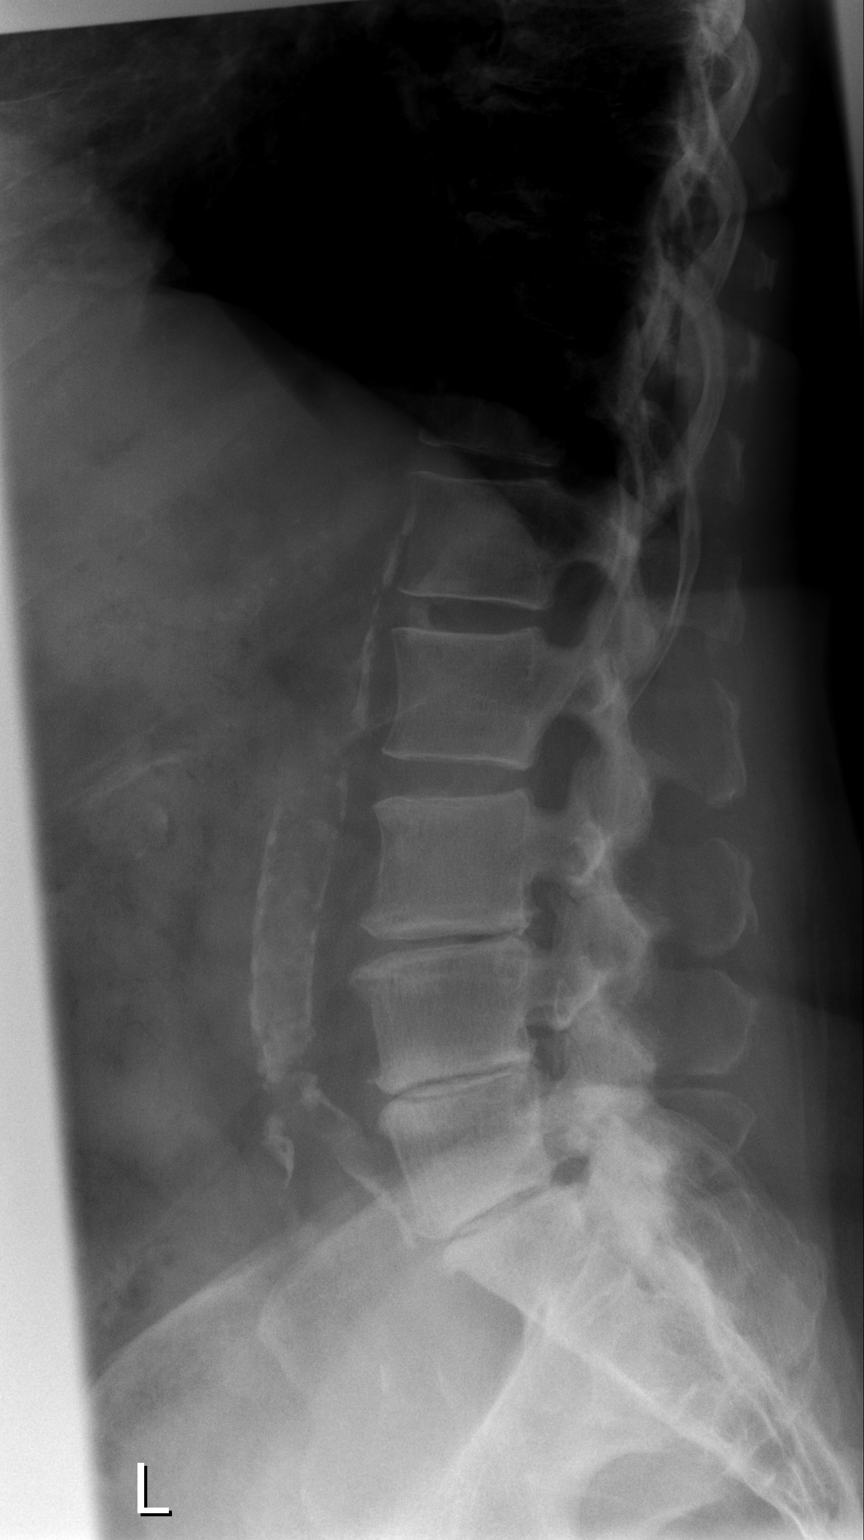

[t l-spine lat (2 of 2)]
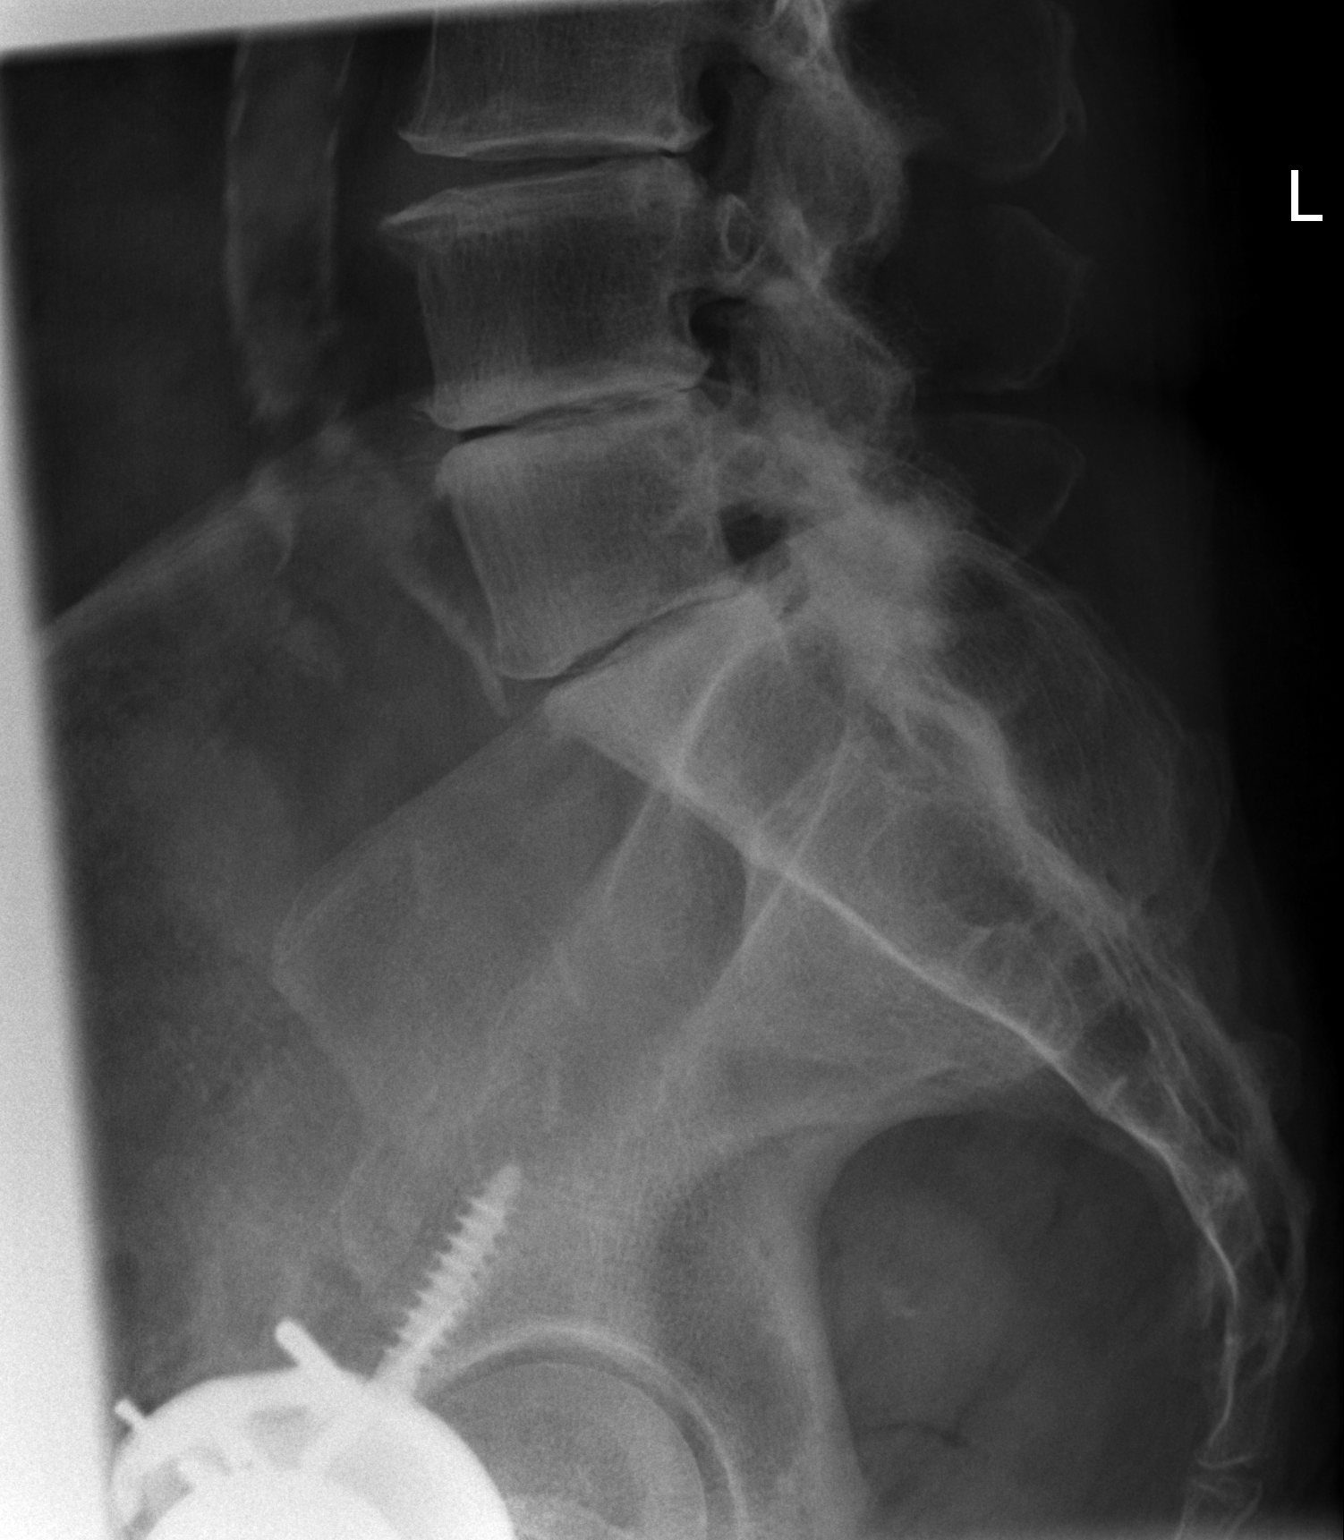

[3 of 3 positions shown; findings below may reference images not displayed]

FINDINGS: The lumbar vertebral bodies are preserved in height. There is
high-grade disc space narrowing at L3-4, L4-5, and L5-S1. There is
moderate facet joint hypertrophy at L5-S1. There is no
spondylolisthesis. The pedicles and transverse processes are intact.
The observed portions of the sacrum are unremarkable.
IMPRESSION: There is high-grade disc space narrowing at L3-4, L4-5, and L5-S1.

## 2016-06-28 ENCOUNTER — Ambulatory Visit (INDEPENDENT_AMBULATORY_CARE_PROVIDER_SITE_OTHER): Payer: Medicare Other | Admitting: Physician Assistant

## 2016-06-28 ENCOUNTER — Encounter: Payer: Self-pay | Admitting: Physician Assistant

## 2016-06-28 VITALS — BP 134/84 | HR 111 | Temp 97.5°F | Ht 64.5 in | Wt 198.0 lb

## 2016-06-28 DIAGNOSIS — N3001 Acute cystitis with hematuria: Secondary | ICD-10-CM

## 2016-06-28 DIAGNOSIS — R300 Dysuria: Secondary | ICD-10-CM | POA: Diagnosis not present

## 2016-06-28 LAB — URINALYSIS
BILIRUBIN UA: NEGATIVE
Glucose, UA: NEGATIVE
Ketones, UA: NEGATIVE
LEUKOCYTES UA: NEGATIVE
NITRITE UA: NEGATIVE
Protein, UA: NEGATIVE
SPEC GRAV UA: 1.015 (ref 1.005–1.030)
Urobilinogen, Ur: 0.2 mg/dL (ref 0.2–1.0)
pH, UA: 6 (ref 5.0–7.5)

## 2016-06-28 MED ORDER — UROGESIC-BLUE 81.6 MG PO TABS
1.0000 | ORAL_TABLET | Freq: Three times a day (TID) | ORAL | 0 refills | Status: DC | PRN
Start: 2016-06-28 — End: 2016-07-29

## 2016-06-28 MED ORDER — CIPROFLOXACIN HCL 500 MG PO TABS: 500.0000 mg | ORAL_TABLET | Freq: Two times a day (BID) | ORAL | 0 refills | Status: DC

## 2016-06-28 NOTE — Patient Instructions (Signed)

## 2016-06-28 NOTE — Progress Notes (Signed)
BP 134/84   Pulse (!) 111   Temp 97.5 F (36.4 C) (Oral)   Ht 5' 4.5" (1.638 m)   Wt 198 lb (89.8 kg)   LMP 04/14/1982   BMI 33.46 kg/m    Subjective:    Patient ID: Rita Payne, female    DOB: 11-26-1945, 71 y.o.   MRN: [id number]  HPI: Rita Payne is a 71 y.o. female presenting on 06/28/2016 for Urinary Tract Infection; Back Pain; Abdominal Pain; and Urinary Frequency  This patient has had several days of dysuria, frequency and nocturia. There is also pain over the bladder in the suprapubic region, no back pain. Denies leakage or hematuria.  Denies fever or chills. No pain in flank area.   Relevant past medical, surgical, family and social history reviewed and updated as indicated. Allergies and medications reviewed and updated.  Past Medical History:  Diagnosis Date  . Arthritis   . Basal cell carcinoma   . First degree heart block   . GERD (gastroesophageal reflux disease)   . History of kidney stones   . Hyperlipidemia   . Hypertension   . Melanoma ([place])   . Mild intermittent asthma   . Mild sleep apnea    mild sleep apnea no cpap ordered/ study approx. 2005  . SUI (stress urinary incontinence, female)   . Type 2 diabetes mellitus ([place])     Past Surgical History:  Procedure Laterality Date  . ABDOMINAL HYSTERECTOMY  1987  . BELPHAROPTOSIS REPAIR Bilateral 2009   9   . CHOLECYSTECTOMY  1989  . HERNIA REPAIR    . kidney tumor removal   1986  . KNEE ARTHROSCOPY Bilateral   . LUMBAR LAMINECTOMY/DECOMPRESSION MICRODISCECTOMY N/A 12/08/2013   Procedure: LUMBAR DECOMPRESSION L4-L5,  L3-L4 ;  Surgeon: Johnn Hai, MD;  Location: [facility];  Service: Orthopedics;  Laterality: N/A;  . PUBOVAGINAL SLING N/A 10/03/2014   Procedure: Gaynelle Arabian;  Surgeon: Carolan Clines, MD;  Location: [facility];  Service: Urology;  Laterality: N/A;  . TONSILLECTOMY    . TOTAL HIP ARTHROPLASTY Right 08/25/2012   Procedure: RIGHT TOTAL HIP ARTHROPLASTY ANTERIOR  APPROACH;  Surgeon: [name], MD;  Location: [facility];  Service: Orthopedics;  Laterality: Right;    Review of Systems  Constitutional: Negative.   HENT: Negative.   Eyes: Negative.   Respiratory: Negative.   Gastrointestinal: Negative.   Genitourinary: Positive for difficulty urinating, dysuria and urgency. Negative for flank pain.    Allergies as of 06/28/2016      Reactions   Ivp Dye [iodinated Diagnostic Agents] Anaphylaxis   Shellfish Allergy Anaphylaxis   Vasotec [enalapril] Shortness Of Breath   Adhesive [tape] Other (See Comments)   blisters   Iodine Other (See Comments)   Iodine that is applied to skin causes blisters    Pyridium [phenazopyridine Hcl]    blisters      Medication List       Accurate as of 06/28/16  6:01 PM. Always use your most recent med list.          aspirin 81 MG tablet Take 1 tablet (81 mg total) by mouth daily. Resume 4 days post-op   celecoxib 200 MG capsule Commonly known as:  CELEBREX TAKE 1 CAPSULE EVERY 12 HOURS   cetirizine 10 MG tablet Commonly known as:  ZYRTEC TAKE 1 TABLET DAILY   cholecalciferol 1000 units tablet Commonly known as:  VITAMIN D Take 1,000 Units by mouth daily.   ciprofloxacin 500  MG tablet Commonly known as:  CIPRO Take 1 tablet (500 mg total) by mouth 2 (two) times daily.   fenofibrate 145 MG tablet Commonly known as:  TRICOR Take 1 tablet (145 mg total) by mouth daily.   Fluticasone-Salmeterol 250-50 MCG/DOSE Aepb Commonly known as:  ADVAIR DISKUS USE 1 INHALATION EVERY 12 HOURS   glimepiride 2 MG tablet Commonly known as:  AMARYL Take 1 tablet (2 mg total) by mouth daily with breakfast.   glucose blood test strip Commonly known as:  FREESTYLE LITE Test qd. DX: E11.9   metFORMIN 500 MG tablet Commonly known as:  GLUCOPHAGE TAKE 1 TABLET TWICE A DAY WITH MEALS   pantoprazole 40 MG tablet Commonly known as:  PROTONIX Take 1 tablet (40 mg total) by mouth daily.   PROAIR HFA 108 (90  Base) MCG/ACT inhaler Generic drug:  albuterol USE 2 INHALATIONS EVERY 6 HOURS AS NEEDED FOR WHEEZING   rosuvastatin 40 MG tablet Commonly known as:  CRESTOR TAKE 1 TABLET AT BEDTIME   telmisartan-hydrochlorothiazide 80-25 MG tablet Commonly known as:  MICARDIS HCT Take 1 tablet by mouth every morning.   UROGESIC-BLUE 81.6 MG Tabs Take 1 tablet (81.6 mg total) by mouth 3 (three) times daily as needed.   VAGIFEM 10 MCG Tabs vaginal tablet Generic drug:  Estradiol          Objective:    BP 134/84   Pulse (!) 111   Temp 97.5 F (36.4 C) (Oral)   Ht 5' 4.5" (1.638 m)   Wt 198 lb (89.8 kg)   LMP 04/14/1982   BMI 33.46 kg/m   Allergies  Allergen Reactions  . Ivp Dye [Iodinated Diagnostic Agents] Anaphylaxis  . Shellfish Allergy Anaphylaxis  . Vasotec [Enalapril] Shortness Of Breath  . Adhesive [Tape] Other (See Comments)    blisters  . Iodine Other (See Comments)    Iodine that is applied to skin causes blisters   . Pyridium [Phenazopyridine Hcl]     blisters    Physical Exam  Constitutional: She is oriented to person, place, and time. She appears well-developed and well-nourished.  HENT:  Head: Normocephalic and atraumatic.  Eyes: Conjunctivae are normal. Pupils are equal, round, and reactive to light.  Cardiovascular: Normal rate, regular rhythm, normal heart sounds and intact distal pulses.   Pulmonary/Chest: Effort normal and breath sounds normal.  Abdominal: Soft. Bowel sounds are normal. She exhibits no distension and no mass. There is tenderness in the suprapubic area. There is no rebound, no guarding and no CVA tenderness.  Neurological: She is alert and oriented to person, place, and time. She has normal reflexes.  Skin: Skin is warm and dry. No rash noted.  Psychiatric: She has a normal mood and affect. Her behavior is normal. Judgment and thought content normal.        Assessment & Plan:   1. Dysuria - Urine Culture - Urinalysis  2. Acute  cystitis with hematuria  3. Acute cystitis without hematuria - Methen-Hyosc-Meth Blue-Na Phos (UROGESIC-BLUE) 81.6 MG TABS; Take 1 tablet (81.6 mg total) by mouth 3 (three) times daily as needed.  Dispense: 6 tablet; Refill: 0   Current Outpatient Prescriptions:  .  aspirin 81 MG tablet, Take 1 tablet (81 mg total) by mouth daily. Resume 4 days post-op, Disp: 30 tablet, Rfl:  .  celecoxib (CELEBREX) 200 MG capsule, TAKE 1 CAPSULE EVERY 12 HOURS, Disp: 180 capsule, Rfl: 0 .  cetirizine (ZYRTEC) 10 MG tablet, TAKE 1 TABLET DAILY, Disp: 90 tablet,  Rfl: 2 .  cholecalciferol (VITAMIN D) 1000 UNITS tablet, Take 1,000 Units by mouth daily., Disp: , Rfl:  .  Fluticasone-Salmeterol (ADVAIR DISKUS) 250-50 MCG/DOSE AEPB, USE 1 INHALATION EVERY 12 HOURS, Disp: 180 each, Rfl: 1 .  glimepiride (AMARYL) 2 MG tablet, Take 1 tablet (2 mg total) by mouth daily with breakfast., Disp: 90 tablet, Rfl: 1 .  glucose blood (FREESTYLE LITE) test strip, Test qd. DX: E11.9, Disp: 100 each, Rfl: 3 .  metFORMIN (GLUCOPHAGE) 500 MG tablet, TAKE 1 TABLET TWICE A DAY WITH MEALS, Disp: 180 tablet, Rfl: 0 .  pantoprazole (PROTONIX) 40 MG tablet, Take 1 tablet (40 mg total) by mouth daily., Disp: 90 tablet, Rfl: 1 .  PROAIR HFA 108 (90 Base) MCG/ACT inhaler, USE 2 INHALATIONS EVERY 6 HOURS AS NEEDED FOR WHEEZING, Disp: 25.5 g, Rfl: 1 .  rosuvastatin (CRESTOR) 40 MG tablet, TAKE 1 TABLET AT BEDTIME, Disp: 90 tablet, Rfl: 1 .  telmisartan-hydrochlorothiazide (MICARDIS HCT) 80-25 MG tablet, Take 1 tablet by mouth every morning., Disp: 90 tablet, Rfl: 1 .  VAGIFEM 10 MCG TABS vaginal tablet, , Disp: , Rfl:  .  ciprofloxacin (CIPRO) 500 MG tablet, Take 1 tablet (500 mg total) by mouth 2 (two) times daily., Disp: 20 tablet, Rfl: 0 .  fenofibrate (TRICOR) 145 MG tablet, Take 1 tablet (145 mg total) by mouth daily. (Patient not taking: Reported on 06/05/2016), Disp: 90 tablet, Rfl: 1 .  Methen-Hyosc-Meth Blue-Na Phos (UROGESIC-BLUE) 81.6  MG TABS, Take 1 tablet (81.6 mg total) by mouth 3 (three) times daily as needed., Disp: 6 tablet, Rfl: 0 No current facility-administered medications for this visit.   Facility-Administered Medications Ordered in Other Visits:  .  magnesium citrate solution 1 Bottle, 1 Bottle, Oral, Once, Carolan Clines, MD .  sodium phosphate (FLEET) 7-19 GM/118ML enema 1 enema, 1 enema, Rectal, Once, Carolan Clines, MD  Continue all other maintenance medications as listed above.  Follow up plan: Follow-up as needed or worsening of symptoms. Call office for any issues.  Educational handout given for UTI  Terald Sleeper PA-C [facility] [address]  [place], [place] [address] [phone]   06/28/2016, 6:01 PM

## 2016-07-01 ENCOUNTER — Other Ambulatory Visit: Payer: Self-pay | Admitting: Nurse Practitioner

## 2016-07-01 DIAGNOSIS — E119 Type 2 diabetes mellitus without complications: Secondary | ICD-10-CM

## 2016-07-03 LAB — URINE CULTURE

## 2016-07-29 ENCOUNTER — Encounter: Payer: Self-pay | Admitting: Nurse Practitioner

## 2016-07-29 ENCOUNTER — Ambulatory Visit (INDEPENDENT_AMBULATORY_CARE_PROVIDER_SITE_OTHER): Payer: Medicare Other

## 2016-07-29 ENCOUNTER — Ambulatory Visit (INDEPENDENT_AMBULATORY_CARE_PROVIDER_SITE_OTHER): Payer: Medicare Other | Admitting: Nurse Practitioner

## 2016-07-29 VITALS — BP 139/84 | HR 95 | Temp 96.7°F | Ht 64.0 in | Wt 195.0 lb

## 2016-07-29 DIAGNOSIS — E559 Vitamin D deficiency, unspecified: Secondary | ICD-10-CM | POA: Diagnosis not present

## 2016-07-29 DIAGNOSIS — E785 Hyperlipidemia, unspecified: Secondary | ICD-10-CM | POA: Diagnosis not present

## 2016-07-29 DIAGNOSIS — K219 Gastro-esophageal reflux disease without esophagitis: Secondary | ICD-10-CM

## 2016-07-29 DIAGNOSIS — F17200 Nicotine dependence, unspecified, uncomplicated: Secondary | ICD-10-CM

## 2016-07-29 DIAGNOSIS — M48061 Spinal stenosis, lumbar region without neurogenic claudication: Secondary | ICD-10-CM | POA: Diagnosis not present

## 2016-07-29 DIAGNOSIS — E119 Type 2 diabetes mellitus without complications: Secondary | ICD-10-CM | POA: Diagnosis not present

## 2016-07-29 DIAGNOSIS — Z1211 Encounter for screening for malignant neoplasm of colon: Secondary | ICD-10-CM | POA: Diagnosis not present

## 2016-07-29 DIAGNOSIS — Z6833 Body mass index (BMI) 33.0-33.9, adult: Secondary | ICD-10-CM

## 2016-07-29 DIAGNOSIS — J4520 Mild intermittent asthma, uncomplicated: Secondary | ICD-10-CM | POA: Diagnosis not present

## 2016-07-29 DIAGNOSIS — Z1212 Encounter for screening for malignant neoplasm of rectum: Secondary | ICD-10-CM | POA: Diagnosis not present

## 2016-07-29 DIAGNOSIS — M810 Age-related osteoporosis without current pathological fracture: Secondary | ICD-10-CM

## 2016-07-29 DIAGNOSIS — I10 Essential (primary) hypertension: Secondary | ICD-10-CM

## 2016-07-29 LAB — BAYER DCA HB A1C WAIVED: HB A1C (BAYER DCA - WAIVED): 8.5 % — ABNORMAL HIGH (ref <7.0)

## 2016-07-29 MED ORDER — ROSUVASTATIN CALCIUM 40 MG PO TABS: 40.0000 mg | ORAL_TABLET | Freq: Every day | ORAL | 1 refills | Status: DC

## 2016-07-29 MED ORDER — PANTOPRAZOLE SODIUM 40 MG PO TBEC: 40.0000 mg | DELAYED_RELEASE_TABLET | Freq: Every day | ORAL | 1 refills | Status: DC

## 2016-07-29 MED ORDER — FENOFIBRATE 145 MG PO TABS: 145.0000 mg | ORAL_TABLET | Freq: Every day | ORAL | 1 refills | Status: DC

## 2016-07-29 MED ORDER — TELMISARTAN-HCTZ 80-25 MG PO TABS: 1.0000 | ORAL_TABLET | Freq: Every morning | ORAL | 1 refills | Status: DC

## 2016-07-29 MED ORDER — FLUTICASONE-SALMETEROL 250-50 MCG/DOSE IN AEPB: INHALATION_SPRAY | RESPIRATORY_TRACT | 1 refills | Status: DC

## 2016-07-29 MED ORDER — GLIMEPIRIDE 2 MG PO TABS: ORAL_TABLET | ORAL | 1 refills | Status: DC

## 2016-07-29 MED ORDER — METFORMIN HCL 500 MG PO TABS: 500.0000 mg | ORAL_TABLET | Freq: Two times a day (BID) | ORAL | 1 refills | Status: DC

## 2016-07-29 NOTE — Progress Notes (Signed)
Subjective:    Patient ID: Rita Payne, female    DOB: Apr 07, 1945, 71 y.o.   MRN: [id number]  HPI  Rita Payne is here today for follow up of chronic medical problem.  Outpatient Encounter Prescriptions as of 07/29/2016  Medication Sig  . aspirin 81 MG tablet Take 1 tablet (81 mg total) by mouth daily. Resume 4 days post-op  . celecoxib (CELEBREX) 200 MG capsule TAKE 1 CAPSULE EVERY 12 HOURS  . cetirizine (ZYRTEC) 10 MG tablet TAKE 1 TABLET DAILY  . cholecalciferol (VITAMIN D) 1000 UNITS tablet Take 1,000 Units by mouth daily.  . fenofibrate (TRICOR) 145 MG tablet Take 1 tablet (145 mg total) by mouth daily. (Patient not taking: Reported on 06/05/2016)  . Fluticasone-Salmeterol (ADVAIR DISKUS) 250-50 MCG/DOSE AEPB USE 1 INHALATION EVERY 12 HOURS  . glimepiride (AMARYL) 2 MG tablet Take 1 tablet (2 mg total) by mouth daily with breakfast.  . glucose blood (FREESTYLE LITE) test strip Test qd. DX: E11.9  . metFORMIN (GLUCOPHAGE) 500 MG tablet TAKE 1 TABLET TWICE A DAY WITH MEALS  . pantoprazole (PROTONIX) 40 MG tablet Take 1 tablet (40 mg total) by mouth daily.  Marland Kitchen PROAIR HFA 108 (90 Base) MCG/ACT inhaler USE 2 INHALATIONS EVERY 6 HOURS AS NEEDED FOR WHEEZING  . rosuvastatin (CRESTOR) 40 MG tablet TAKE 1 TABLET AT BEDTIME  . telmisartan-hydrochlorothiazide (MICARDIS HCT) 80-25 MG tablet Take 1 tablet by mouth every morning.  Marland Kitchen VAGIFEM 10 MCG TABS vaginal tablet    1. Type 2 diabetes mellitus without complication, without long-term current use of insulin (HCC)  Last hgba1c was 7.5%. Blood sugars have been running around >150's in mornings. Is better throughout the day. No hypoglycemia that patient is aware of.  2. Essential hypertension  No c/o chest pain, SOB or headache. Not able to check blood pressures at home  3. Gastroesophageal reflux disease without esophagitis  Currently on protonix that works well to keep symptoms under control  4. Senile osteoporosis  Has back pain but is from  spinal stenosis  5. Vitamin D deficiency  Takes vitamin d OTC  6. Spinal stenosis of lumbar region at multiple levels  Has chronic back pain. Is currently on on celebrrex  7. Smoker   has no desire  To stop smoking  8. Morbid obesity ([place])  No recent changes in weight  9. Hyperlipidemia with target LDL less than 100  Low fat diet and exercise  10. BMI 33.0-33.9,adult  Again no recent weighty changes  11. Asthma, chronic, mild intermittent, uncomplicated  On advair daily- has not needed albuterol in the last month    New complaints: none  Social history: -brother was dying from CHF and [facility] sent him home on hospice. SHe ended up taking him to [place] and they did thoracentesis and put 2 stents in heart and he is now doing well and living by himself.  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    Review of Systems  Constitutional: Negative for activity change and appetite change.  HENT: Negative.   Eyes: Negative for pain.  Respiratory: Negative for shortness of breath.   Cardiovascular: Positive for chest pain (right sided that radiate sup into rigght shoulder and neck. Occurs 2-3 x a week lasting about 30 minutes. NO c/o sob). Negative for palpitations and leg swelling.  Gastrointestinal: Negative for abdominal pain.  Endocrine: Negative for polydipsia.  Genitourinary: Negative.   Skin: Negative  for rash.  Neurological: Negative for dizziness, weakness and headaches.  Hematological: Does not bruise/bleed easily.  Psychiatric/Behavioral: Negative.   All other systems reviewed and are negative.      Objective:   Physical Exam  Constitutional: She is oriented to person, place, and time. She appears well-developed and well-nourished.  HENT:  Nose: Nose normal.  Mouth/Throat: Oropharynx is clear and moist.  Eyes: EOM are normal.  Neck: Trachea normal, normal range of motion and full passive range of motion without  pain. Neck supple. No JVD present. Carotid bruit is not present. No thyromegaly present.  Cardiovascular: Normal rate, regular rhythm, normal heart sounds and intact distal pulses.  Exam reveals no gallop and no friction rub.   No murmur heard. Pulmonary/Chest: Effort normal and breath sounds normal.  Abdominal: Soft. Bowel sounds are normal. She exhibits no distension and no mass. There is no tenderness.  Musculoskeletal: Normal range of motion.  Lymphadenopathy:    She has no cervical adenopathy.  Neurological: She is alert and oriented to person, place, and time. She has normal reflexes.  Skin: Skin is warm and dry.  Psychiatric: She has a normal mood and affect. Her behavior is normal. Judgment and thought content normal.   BP 139/84   Pulse 95   Temp (!) 96.7 F (35.9 C) (Oral)   Ht _0  (1.626 m)   Wt 195 lb (88.5 kg)   LMP 04/14/1982   BMI 33.47 kg/m   EKG- NSR-Mary-Margaret Hassell Done, FNP  Chest x ray- no acute or chronic findings-Preliminary reading by Ronnald Collum, FNP  [facility]      Assessment & Plan:  1. Type 2 diabetes mellitus without complication, without long-term current use of insulin (HCC) Increases amaryl to add 1/2 -1 tablet in evening Keep diary of blood sugars - Bayer DCA Hb A1c Waived - Lipid panel - Microalbumin / creatinine urine ratio - glimepiride (AMARYL) 2 MG tablet; 1 tablet in am and 1/2 -1  tablet pm  Dispense: 180 tablet; Refill: 1 - metFORMIN (GLUCOPHAGE) 500 MG tablet; Take 1 tablet (500 mg total) by mouth 2 (two) times daily with a meal.  Dispense: 180 tablet; Refill: 1  2. Essential hypertension Low sodium diet - CMP14+EGFR - telmisartan-hydrochlorothiazide (MICARDIS HCT) 80-25 MG tablet; Take 1 tablet by mouth every morning.  Dispense: 90 tablet; Refill: 1 - DG Chest 2 View; Future - EKG 12-Lead  3. Gastroesophageal reflux disease without esophagitis Avoid spicy foods Do not eat 2 hours prior to bedtime - pantoprazole (PROTONIX) 40 MG  tablet; Take 1 tablet (40 mg total) by mouth daily.  Dispense: 90 tablet; Refill: 1  4. Senile osteoporosis Weight bearing exercises  5. Vitamin D deficiency Continue vitamin d OTC daily  6. Spinal stenosis of lumbar region at multiple levels Back stretching exercises  7. Smoker Smoking cessation encourgaed  8. Morbid obesity ([place]) ,\Discussed diet and exercise for person with BMI >25 Will recheck weight in 3-6 months  9. Hyperlipidemia with target LDL less than 100 Low fat diet - rosuvastatin (CRESTOR) 40 MG tablet; Take 1 tablet (40 mg total) by mouth at bedtime.  Dispense: 90 tablet; Refill: 1  10. BMI 33.0-33.9,adult Discussed diet and exercise for person with BMI >25 Will recheck weight in 3-6 months  11. Asthma, chronic, mild intermittent, uncomplicated - Fluticasone-Salmeterol (ADVAIR DISKUS) 250-50 MCG/DOSE AEPB; USE 1 INHALATION EVERY 12 HOURS  Dispense: 180 each; Refill: 1   dexa scan ordered- will call with appointment Labs pending Health maintenance reviewed  Diet and exercise encouraged Continue all meds Follow up  In 3 months   [place], FNP

## 2016-07-29 NOTE — Patient Instructions (Signed)
Steps to Quit Smoking Smoking tobacco can be bad for your health. It can also affect almost every organ in your body. Smoking puts you and people around you at risk for many serious long-lasting (chronic) diseases. Quitting smoking is hard, but it is one of the best things that you can do for your health. It is never too late to quit. What are the benefits of quitting smoking? When you quit smoking, you lower your risk for getting serious diseases and conditions. They can include:  Lung cancer or lung disease.  Heart disease.  Stroke.  Heart attack.  Not being able to have children (infertility).  Weak bones (osteoporosis) and broken bones (fractures).  If you have coughing, wheezing, and shortness of breath, those symptoms may get better when you quit. You may also get sick less often. If you are pregnant, quitting smoking can help to lower your chances of having a baby of low birth weight. What can I do to help me quit smoking? Talk with your doctor about what can help you quit smoking. Some things you can do (strategies) include:  Quitting smoking totally, instead of slowly cutting back how much you smoke over a period of time.  Going to in-person counseling. You are more likely to quit if you go to many counseling sessions.  Using resources and support systems, such as: ? Online chats with a counselor. ? Phone quitlines. ? Printed self-help materials. ? Support groups or group counseling. ? Text messaging programs. ? Mobile phone apps or applications.  Taking medicines. Some of these medicines may have nicotine in them. If you are pregnant or breastfeeding, do not take any medicines to quit smoking unless your doctor says it is okay. Talk with your doctor about counseling or other things that can help you.  Talk with your doctor about using more than one strategy at the same time, such as taking medicines while you are also going to in-person counseling. This can help make  quitting easier. What things can I do to make it easier to quit? Quitting smoking might feel very hard at first, but there is a lot that you can do to make it easier. Take these steps:  Talk to your family and friends. Ask them to support and encourage you.  Call phone quitlines, reach out to support groups, or work with a counselor.  Ask people who smoke to not smoke around you.  Avoid places that make you want (trigger) to smoke, such as: ? Bars. ? Parties. ? Smoke-break areas at work.  Spend time with people who do not smoke.  Lower the stress in your life. Stress can make you want to smoke. Try these things to help your stress: ? Getting regular exercise. ? Deep-breathing exercises. ? Yoga. ? Meditating. ? Doing a body scan. To do this, close your eyes, focus on one area of your body at a time from head to toe, and notice which parts of your body are tense. Try to relax the muscles in those areas.  Download or buy apps on your mobile phone or tablet that can help you stick to your quit plan. There are many free apps, such as QuitGuide from the CDC (Centers for Disease Control and Prevention). You can find more support from smokefree.gov and other websites.  This information is not intended to replace advice given to you by your health care provider. Make sure you discuss any questions you have with your health care provider. Document Released: 10/20/2008 Document   Revised: 08/22/2015 Document Reviewed: 05/10/2014 Elsevier Interactive Patient Education  2018 Elsevier Inc.  

## 2016-07-30 ENCOUNTER — Other Ambulatory Visit: Payer: Self-pay | Admitting: *Deleted

## 2016-07-30 DIAGNOSIS — K219 Gastro-esophageal reflux disease without esophagitis: Secondary | ICD-10-CM

## 2016-07-30 DIAGNOSIS — I10 Essential (primary) hypertension: Secondary | ICD-10-CM

## 2016-07-30 DIAGNOSIS — E785 Hyperlipidemia, unspecified: Secondary | ICD-10-CM

## 2016-07-30 DIAGNOSIS — J4520 Mild intermittent asthma, uncomplicated: Secondary | ICD-10-CM

## 2016-07-30 DIAGNOSIS — E119 Type 2 diabetes mellitus without complications: Secondary | ICD-10-CM

## 2016-07-30 LAB — CMP14+EGFR
ALBUMIN: 4.4 g/dL (ref 3.6–4.8)
ALK PHOS: 76 IU/L (ref 39–117)
ALT: 34 IU/L — ABNORMAL HIGH (ref 0–32)
AST: 35 IU/L (ref 0–40)
Albumin/Globulin Ratio: 1.8 (ref 1.2–2.2)
BUN / CREAT RATIO: 17 (ref 12–28)
BUN: 11 mg/dL (ref 8–27)
Bilirubin Total: 0.6 mg/dL (ref 0.0–1.2)
CO2: 26 mmol/L (ref 20–29)
CREATININE: 0.66 mg/dL (ref 0.57–1.00)
Calcium: 9.7 mg/dL (ref 8.7–10.3)
Chloride: 96 mmol/L (ref 96–106)
GFR calc non Af Amer: 90 mL/min/{1.73_m2} (ref >59)
GFR, EST AFRICAN AMERICAN: 104 mL/min/{1.73_m2} (ref >59)
GLOBULIN, TOTAL: 2.5 g/dL (ref 1.5–4.5)
Glucose: 209 mg/dL — ABNORMAL HIGH (ref 65–99)
Potassium: 4.2 mmol/L (ref 3.5–5.2)
SODIUM: 137 mmol/L (ref 134–144)
Total Protein: 6.9 g/dL (ref 6.0–8.5)

## 2016-07-30 LAB — LIPID PANEL
CHOLESTEROL TOTAL: 135 mg/dL (ref 100–199)
Chol/HDL Ratio: 4.1 ratio (ref 0.0–4.4)
HDL: 33 mg/dL — ABNORMAL LOW (ref >39)
LDL CALC: 48 mg/dL (ref 0–99)
Triglycerides: 272 mg/dL — ABNORMAL HIGH (ref 0–149)
VLDL Cholesterol Cal: 54 mg/dL — ABNORMAL HIGH (ref 5–40)

## 2016-07-30 LAB — MICROALBUMIN / CREATININE URINE RATIO
CREATININE, UR: 80 mg/dL
MICROALB/CREAT RATIO: 113 mg/g{creat} — AB (ref 0.0–30.0)
MICROALBUM., U, RANDOM: 90.4 ug/mL

## 2016-07-30 MED ORDER — FENOFIBRATE 145 MG PO TABS: 145.0000 mg | ORAL_TABLET | Freq: Every day | ORAL | 1 refills | Status: DC

## 2016-07-30 MED ORDER — GLUCOSE BLOOD VI STRP: ORAL_STRIP | 3 refills | Status: DC

## 2016-07-30 MED ORDER — TELMISARTAN-HCTZ 80-25 MG PO TABS
1.0000 | ORAL_TABLET | Freq: Every morning | ORAL | 1 refills | Status: DC
Start: 2016-07-30 — End: 2017-01-26

## 2016-07-30 MED ORDER — FLUTICASONE-SALMETEROL 250-50 MCG/DOSE IN AEPB: INHALATION_SPRAY | RESPIRATORY_TRACT | 1 refills | Status: DC

## 2016-07-30 MED ORDER — GLIMEPIRIDE 2 MG PO TABS: ORAL_TABLET | ORAL | 1 refills | Status: DC

## 2016-07-30 MED ORDER — METFORMIN HCL 500 MG PO TABS: 500.0000 mg | ORAL_TABLET | Freq: Two times a day (BID) | ORAL | 1 refills | Status: DC

## 2016-07-30 MED ORDER — PANTOPRAZOLE SODIUM 40 MG PO TBEC: 40.0000 mg | DELAYED_RELEASE_TABLET | Freq: Every day | ORAL | 1 refills | Status: DC

## 2016-07-31 ENCOUNTER — Ambulatory Visit (INDEPENDENT_AMBULATORY_CARE_PROVIDER_SITE_OTHER): Payer: Medicare Other | Admitting: Family Medicine

## 2016-07-31 ENCOUNTER — Encounter: Payer: Self-pay | Admitting: Family Medicine

## 2016-07-31 VITALS — BP 125/78 | HR 84 | Temp 98.1°F | Ht 64.0 in | Wt 197.0 lb

## 2016-07-31 DIAGNOSIS — R300 Dysuria: Secondary | ICD-10-CM | POA: Diagnosis not present

## 2016-07-31 DIAGNOSIS — N3000 Acute cystitis without hematuria: Secondary | ICD-10-CM | POA: Diagnosis not present

## 2016-07-31 MED ORDER — CIPROFLOXACIN HCL 500 MG PO TABS: 500.0000 mg | ORAL_TABLET | Freq: Two times a day (BID) | ORAL | 0 refills | Status: DC

## 2016-07-31 MED ORDER — UROGESIC-BLUE 81.6 MG PO TABS: 1.0000 | ORAL_TABLET | Freq: Three times a day (TID) | ORAL | 0 refills | Status: DC | PRN

## 2016-07-31 NOTE — Progress Notes (Signed)
BP 125/78   Pulse 84   Temp 98.1 F (36.7 C) (Oral)   Ht 5\' 4"  (1.626 m)   Wt 197 lb (89.4 kg)   LMP 04/14/1982   BMI 33.81 kg/m    Subjective:    Patient ID: Rita Payne, female    DOB: Jul 11, 1945, 71 y.o.   MRN: [id number]  HPI: Rita Payne is a 71 y.o. female presenting on 07/31/2016 for Urinary Tract Infection (dysuria, hesitancy, abdominal and back pain)   HPI UTI Patient comes in complaining of dysuria and hesitancy and abdominal pain and back pain that has been worsening over the past 3 days. Been trying to increase her fluid but it is not seem to be helping. She is a known diabetic. She denies any fevers or chills or hematuria. Her back pain is bilateral lower back and her abdominal pain is in the lower aspect in the middle.  Relevant past medical, surgical, family and social history reviewed and updated as indicated. Interim medical history since our last visit reviewed. Allergies and medications reviewed and updated.  Review of Systems  Constitutional: Negative for chills and fever.  Respiratory: Negative for chest tightness and shortness of breath.   Cardiovascular: Negative for chest pain and leg swelling.  Gastrointestinal: Positive for abdominal pain.  Genitourinary: Positive for dysuria, frequency and urgency. Negative for decreased urine volume, difficulty urinating, hematuria, vaginal bleeding, vaginal discharge and vaginal pain.  Musculoskeletal: Positive for back pain. Negative for gait problem.  Skin: Negative for rash.  Neurological: Negative for light-headedness and headaches.  Psychiatric/Behavioral: Negative for agitation and behavioral problems.  All other systems reviewed and are negative.   Per HPI unless specifically indicated above        Objective:    BP 125/78   Pulse 84   Temp 98.1 F (36.7 C) (Oral)   Ht 5\' 4"  (1.626 m)   Wt 197 lb (89.4 kg)   LMP 04/14/1982   BMI 33.81 kg/m   Wt Readings from Last 3 Encounters:  07/31/16 197  lb (89.4 kg)  07/29/16 195 lb (88.5 kg)  06/28/16 198 lb (89.8 kg)    Physical Exam  Constitutional: She is oriented to person, place, and time. She appears well-developed and well-nourished. No distress.  Eyes: Conjunctivae are normal.  Cardiovascular: Normal rate, regular rhythm, normal heart sounds and intact distal pulses.   No murmur heard. Pulmonary/Chest: Effort normal and breath sounds normal. No respiratory distress. She has no wheezes. She has no rales.  Abdominal: Soft. Bowel sounds are normal. She exhibits no distension. There is tenderness in the suprapubic area. There is no rigidity, no rebound, no guarding and no CVA tenderness.  Musculoskeletal: Normal range of motion.  Neurological: She is alert and oriented to person, place, and time. Coordination normal.  Skin: Skin is warm and dry. No rash noted. She is not diaphoretic.  Psychiatric: She has a normal mood and affect. Her behavior is normal.  Nursing note and vitals reviewed.   Urinalysis: Greater than 30 WBCs, 3-10 RBCs, 0-10 epithelial cells, few bacteria, 1+ blood and 2+ leukocytes    Assessment & Plan:   Problem List Items Addressed This Visit    None    Visit Diagnoses    Acute cystitis without hematuria    -  Primary   Relevant Medications   ciprofloxacin (CIPRO) 500 MG tablet   Methen-Hyosc-Meth Blue-Na Phos (UROGESIC-BLUE) 81.6 MG TABS   Other Relevant Orders   Urinalysis, Complete  Follow up plan: Return if symptoms worsen or fail to improve.  Counseling provided for all of the vaccine components Orders Placed This Encounter  Procedures  . Urinalysis, Complete    Caryl Pina, MD [facility] 07/31/2016, 5:37 PM

## 2016-08-02 LAB — MICROSCOPIC EXAMINATION: WBC, UA: >30 /hpf — ABNORMAL HIGH (ref 0–?)

## 2016-08-02 LAB — URINALYSIS, COMPLETE
Bilirubin, UA: NEGATIVE
Glucose, UA: NEGATIVE
Ketones, UA: NEGATIVE
Nitrite, UA: NEGATIVE
PH UA: 6 (ref 5.0–7.5)
Protein, UA: NEGATIVE
Specific Gravity, UA: 1.015 (ref 1.005–1.030)
Urobilinogen, Ur: 0.2 mg/dL (ref 0.2–1.0)

## 2016-08-03 LAB — FECAL OCCULT BLOOD, IMMUNOCHEMICAL: Fecal Occult Bld: NEGATIVE

## 2016-08-13 ENCOUNTER — Ambulatory Visit (INDEPENDENT_AMBULATORY_CARE_PROVIDER_SITE_OTHER): Payer: Medicare Other

## 2016-08-13 DIAGNOSIS — M810 Age-related osteoporosis without current pathological fracture: Secondary | ICD-10-CM | POA: Diagnosis not present

## 2016-09-02 ENCOUNTER — Other Ambulatory Visit: Payer: Self-pay | Admitting: Nurse Practitioner

## 2016-09-16 DIAGNOSIS — E113292 Type 2 diabetes mellitus with mild nonproliferative diabetic retinopathy without macular edema, left eye: Secondary | ICD-10-CM | POA: Diagnosis not present

## 2016-09-16 DIAGNOSIS — H43813 Vitreous degeneration, bilateral: Secondary | ICD-10-CM | POA: Diagnosis not present

## 2016-09-16 DIAGNOSIS — H2513 Age-related nuclear cataract, bilateral: Secondary | ICD-10-CM | POA: Diagnosis not present

## 2016-09-16 DIAGNOSIS — E119 Type 2 diabetes mellitus without complications: Secondary | ICD-10-CM | POA: Diagnosis not present

## 2016-09-16 LAB — HM DIABETES EYE EXAM

## 2016-10-16 DIAGNOSIS — N3020 Other chronic cystitis without hematuria: Secondary | ICD-10-CM | POA: Diagnosis not present

## 2016-10-20 ENCOUNTER — Other Ambulatory Visit: Payer: Self-pay | Admitting: Nurse Practitioner

## 2016-10-20 DIAGNOSIS — K219 Gastro-esophageal reflux disease without esophagitis: Secondary | ICD-10-CM

## 2016-11-08 ENCOUNTER — Encounter: Payer: Self-pay | Admitting: Nurse Practitioner

## 2016-11-08 ENCOUNTER — Ambulatory Visit (INDEPENDENT_AMBULATORY_CARE_PROVIDER_SITE_OTHER): Payer: Medicare Other | Admitting: Nurse Practitioner

## 2016-11-08 VITALS — BP 121/75 | HR 94 | Temp 97.0°F | Ht 64.0 in | Wt 196.0 lb

## 2016-11-08 DIAGNOSIS — E119 Type 2 diabetes mellitus without complications: Secondary | ICD-10-CM | POA: Diagnosis not present

## 2016-11-08 DIAGNOSIS — Z6833 Body mass index (BMI) 33.0-33.9, adult: Secondary | ICD-10-CM | POA: Diagnosis not present

## 2016-11-08 DIAGNOSIS — F17200 Nicotine dependence, unspecified, uncomplicated: Secondary | ICD-10-CM

## 2016-11-08 DIAGNOSIS — K219 Gastro-esophageal reflux disease without esophagitis: Secondary | ICD-10-CM | POA: Diagnosis not present

## 2016-11-08 DIAGNOSIS — E559 Vitamin D deficiency, unspecified: Secondary | ICD-10-CM

## 2016-11-08 DIAGNOSIS — Z23 Encounter for immunization: Secondary | ICD-10-CM | POA: Diagnosis not present

## 2016-11-08 DIAGNOSIS — I10 Essential (primary) hypertension: Secondary | ICD-10-CM

## 2016-11-08 DIAGNOSIS — E785 Hyperlipidemia, unspecified: Secondary | ICD-10-CM

## 2016-11-08 LAB — BAYER DCA HB A1C WAIVED: HB A1C: 9.1 % — AB (ref <7.0)

## 2016-11-08 MED ORDER — PANTOPRAZOLE SODIUM 40 MG PO TBEC: 40.0000 mg | DELAYED_RELEASE_TABLET | Freq: Every day | ORAL | 1 refills | Status: DC

## 2016-11-08 MED ORDER — ROSUVASTATIN CALCIUM 40 MG PO TABS: 40.0000 mg | ORAL_TABLET | Freq: Every day | ORAL | 1 refills | Status: DC

## 2016-11-08 NOTE — Patient Instructions (Signed)
Steps to Quit Smoking Smoking tobacco can be bad for your health. It can also affect almost every organ in your body. Smoking puts you and people around you at risk for many serious long-lasting (chronic) diseases. Quitting smoking is hard, but it is one of the best things that you can do for your health. It is never too late to quit. What are the benefits of quitting smoking? When you quit smoking, you lower your risk for getting serious diseases and conditions. They can include:  Lung cancer or lung disease.  Heart disease.  Stroke.  Heart attack.  Not being able to have children (infertility).  Weak bones (osteoporosis) and broken bones (fractures).  If you have coughing, wheezing, and shortness of breath, those symptoms may get better when you quit. You may also get sick less often. If you are pregnant, quitting smoking can help to lower your chances of having a baby of low birth weight. What can I do to help me quit smoking? Talk with your doctor about what can help you quit smoking. Some things you can do (strategies) include:  Quitting smoking totally, instead of slowly cutting back how much you smoke over a period of time.  Going to in-person counseling. You are more likely to quit if you go to many counseling sessions.  Using resources and support systems, such as: ? Online chats with a counselor. ? Phone quitlines. ? Printed self-help materials. ? Support groups or group counseling. ? Text messaging programs. ? Mobile phone apps or applications.  Taking medicines. Some of these medicines may have nicotine in them. If you are pregnant or breastfeeding, do not take any medicines to quit smoking unless your doctor says it is okay. Talk with your doctor about counseling or other things that can help you.  Talk with your doctor about using more than one strategy at the same time, such as taking medicines while you are also going to in-person counseling. This can help make  quitting easier. What things can I do to make it easier to quit? Quitting smoking might feel very hard at first, but there is a lot that you can do to make it easier. Take these steps:  Talk to your family and friends. Ask them to support and encourage you.  Call phone quitlines, reach out to support groups, or work with a counselor.  Ask people who smoke to not smoke around you.  Avoid places that make you want (trigger) to smoke, such as: ? Bars. ? Parties. ? Smoke-break areas at work.  Spend time with people who do not smoke.  Lower the stress in your life. Stress can make you want to smoke. Try these things to help your stress: ? Getting regular exercise. ? Deep-breathing exercises. ? Yoga. ? Meditating. ? Doing a body scan. To do this, close your eyes, focus on one area of your body at a time from head to toe, and notice which parts of your body are tense. Try to relax the muscles in those areas.  Download or buy apps on your mobile phone or tablet that can help you stick to your quit plan. There are many free apps, such as QuitGuide from the CDC (Centers for Disease Control and Prevention). You can find more support from smokefree.gov and other websites.  This information is not intended to replace advice given to you by your health care provider. Make sure you discuss any questions you have with your health care provider. Document Released: 10/20/2008 Document   Revised: 08/22/2015 Document Reviewed: 05/10/2014 Elsevier Interactive Patient Education  2018 Elsevier Inc.  

## 2016-11-08 NOTE — Progress Notes (Signed)
Subjective:    Patient ID: Rita Payne, female    DOB: 31-Mar-1945, 71 y.o.   MRN: [id number]  HPI  Rita Payne is here today for follow up of chronic medical problem.  Outpatient Encounter Prescriptions as of 11/08/2016  Medication Sig  . aspirin 81 MG tablet Take 1 tablet (81 mg total) by mouth daily. Resume 4 days post-op  . celecoxib (CELEBREX) 200 MG capsule TAKE 1 CAPSULE EVERY 12 HOURS  . cetirizine (ZYRTEC) 10 MG tablet TAKE 1 TABLET DAILY  . cholecalciferol (VITAMIN D) 1000 UNITS tablet Take 1,000 Units by mouth daily.  . ciprofloxacin (CIPRO) 500 MG tablet Take 1 tablet (500 mg total) by mouth 2 (two) times daily.  . fenofibrate (TRICOR) 145 MG tablet Take 1 tablet (145 mg total) by mouth daily.  . Fluticasone-Salmeterol (ADVAIR DISKUS) 250-50 MCG/DOSE AEPB USE 1 INHALATION EVERY 12 HOURS  . glimepiride (AMARYL) 2 MG tablet 1 tablet in am and 1/2 -1  tablet pm  . glucose blood (FREESTYLE LITE) test strip Test qd. DX: E11.9  . metFORMIN (GLUCOPHAGE) 500 MG tablet Take 1 tablet (500 mg total) by mouth 2 (two) times daily with a meal.  . Methen-Hyosc-Meth Blue-Na Phos (UROGESIC-BLUE) 81.6 MG TABS Take 1 tablet (81.6 mg total) by mouth 3 (three) times daily as needed.  . pantoprazole (PROTONIX) 40 MG tablet TAKE 1 TABLET DAILY  . PROAIR HFA 108 (90 Base) MCG/ACT inhaler USE 2 INHALATIONS EVERY 6 HOURS AS NEEDED FOR WHEEZING  . rosuvastatin (CRESTOR) 40 MG tablet Take 1 tablet (40 mg total) by mouth at bedtime.  Marland Kitchen telmisartan-hydrochlorothiazide (MICARDIS HCT) 80-25 MG tablet Take 1 tablet by mouth every morning.  Marland Kitchen VAGIFEM 10 MCG TABS vaginal tablet      1. Essential hypertension  No c/o chest pain,sob or headache. Does ot check blood pressures at home. BP Readings from Last 3 Encounters:  07/31/16 125/78  07/29/16 139/84  06/28/16 134/84     2. Gastroesophageal reflux disease without esophagitis  She is currently on protonix daily which keeps symptoms under ocntrol  3.  Type 2 diabetes mellitus without complication, without long-term current use of insulin ([employer]) last hgba1c was 8.5%. We increased amaryl by 1/2 tablet in evening. Blood sugars are running close to 200 in mornings. She diid not increase her amaryl dose as instructed at last visit  4. Vitamin D deficiency  Takes vitamin d when remembers  5. Smoker  Says that she has tried to quit but just can't  6. Hyperlipidemia with target LDL less than 100  Does not watch diet  7. BMI 33.0-33.9,adult  Again no recent weight changes    New complaints: -she has a history of asthma- currently having flare up with wheezing and coughing. 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  Negative for activity change and appetite change.  HENT: Negative.   Eyes: Negative for pain.  Respiratory: Negative for shortness of breath.   Cardiovascular: Negative for chest pain, palpitations and leg swelling.  Gastrointestinal: Negative for abdominal pain.  Endocrine: Negative for polydipsia.  Genitourinary: Negative.   Skin: Negative for rash.  Neurological: Negative for dizziness, weakness and headaches.  Hematological: Does not bruise/bleed easily.  Psychiatric/Behavioral: Negative.   All other systems reviewed and are negative.      Objective:   Physical Exam  Constitutional: She is oriented to person, place, and time.  She appears well-developed and well-nourished.  HENT:  Nose: Nose normal.  Mouth/Throat: Oropharynx is clear and moist.  Eyes: EOM are normal.  Neck: Trachea normal, normal range of motion and full passive range of motion without pain. Neck supple. No JVD present. Carotid bruit is not present. No thyromegaly present.  Cardiovascular: Normal rate, regular rhythm, normal heart sounds and intact distal pulses.  Exam reveals no gallop and no friction rub.   No murmur heard. Pulmonary/Chest:  Effort normal and breath sounds normal.  Abdominal: Soft. Bowel sounds are normal. She exhibits no distension and no mass. There is no tenderness.  Musculoskeletal: Normal range of motion.  FROM of left knee with crepitus All ligaments intact  Lymphadenopathy:    She has no cervical adenopathy.  Neurological: She is alert and oriented to person, place, and time. She has normal reflexes.  Skin: Skin is warm and dry.  Psychiatric: She has a normal mood and affect. Her behavior is normal. Judgment and thought content normal.   BP 121/75   Pulse 94   Temp (!) 97 F (36.1 C) (Oral)   Ht _0  (1.626 m)   Wt 196 lb (88.9 kg)   LMP 04/14/1982   BMI 33.64 kg/m   hgba1c 9.1%     Assessment & Plan:  1. Essential hypertension Low sodium diet - CMP14+EGFR  2. Gastroesophageal reflux disease without esophagitis Avoid spicy foods Do not eat 2 hours prior to bedtime - pantoprazole (PROTONIX) 40 MG tablet; Take 1 tablet (40 mg total) by mouth daily.  Dispense: 90 tablet; Refill: 1  3. Type 2 diabetes mellitus without complication, without long-term current use of insulin ([place]) Watch carbs in diet Patient is going to increase metformin to 149m bid- last timeshe tried this it caused vomiting but now she is not sure it was from meds- will try and let me know how she does - Bayer DCA Hb A1c Waived  4. Vitamin D deficiency Continue vitamin d otc daily  5. Smoker Is going  To contact insurance to see if will pay for nicotine patches  6. Hyperlipidemia with target LDL less than 100 Low fat diet - Lipid panel - rosuvastatin (CRESTOR) 40 MG tablet; Take 1 tablet (40 mg total) by mouth at bedtime.  Dispense: 90 tablet; Refill: 1  7. BMI 33.0-33.9,adult Discussed diet and exercise for person with BMI >25 Will recheck weight in 3-6 months     Labs pending Health maintenance reviewed Diet and exercise encouraged Continue all meds Follow up  In 3 months   MMurrieta  FNP

## 2016-11-09 LAB — LIPID PANEL
CHOL/HDL RATIO: 4.3 ratio (ref 0.0–4.4)
Cholesterol, Total: 146 mg/dL (ref 100–199)
HDL: 34 mg/dL — AB (ref >39)
LDL CALC: 58 mg/dL (ref 0–99)
Triglycerides: 270 mg/dL — ABNORMAL HIGH (ref 0–149)
VLDL CHOLESTEROL CAL: 54 mg/dL — AB (ref 5–40)

## 2016-11-09 LAB — CMP14+EGFR
A/G RATIO: 2 (ref 1.2–2.2)
ALBUMIN: 4.6 g/dL (ref 3.6–4.8)
ALK PHOS: 71 IU/L (ref 39–117)
ALT: 28 IU/L (ref 0–32)
AST: 23 IU/L (ref 0–40)
BUN / CREAT RATIO: 24 (ref 12–28)
BUN: 17 mg/dL (ref 8–27)
Bilirubin Total: 0.5 mg/dL (ref 0.0–1.2)
CHLORIDE: 97 mmol/L (ref 96–106)
CO2: 24 mmol/L (ref 20–29)
CREATININE: 0.7 mg/dL (ref 0.57–1.00)
Calcium: 9.8 mg/dL (ref 8.7–10.3)
GFR calc Af Amer: 102 mL/min/{1.73_m2} (ref >59)
GFR, EST NON AFRICAN AMERICAN: 89 mL/min/{1.73_m2} (ref >59)
GLOBULIN, TOTAL: 2.3 g/dL (ref 1.5–4.5)
Glucose: 193 mg/dL — ABNORMAL HIGH (ref 65–99)
Potassium: 4 mmol/L (ref 3.5–5.2)
Sodium: 140 mmol/L (ref 134–144)
Total Protein: 6.9 g/dL (ref 6.0–8.5)

## 2016-12-02 ENCOUNTER — Other Ambulatory Visit: Payer: Self-pay | Admitting: Nurse Practitioner

## 2016-12-25 DIAGNOSIS — L570 Actinic keratosis: Secondary | ICD-10-CM | POA: Diagnosis not present

## 2017-01-09 ENCOUNTER — Other Ambulatory Visit: Payer: Self-pay | Admitting: Nurse Practitioner

## 2017-01-13 DIAGNOSIS — Z1231 Encounter for screening mammogram for malignant neoplasm of breast: Secondary | ICD-10-CM | POA: Diagnosis not present

## 2017-01-26 ENCOUNTER — Other Ambulatory Visit: Payer: Self-pay | Admitting: Nurse Practitioner

## 2017-01-26 DIAGNOSIS — I10 Essential (primary) hypertension: Secondary | ICD-10-CM

## 2017-01-26 DIAGNOSIS — E119 Type 2 diabetes mellitus without complications: Secondary | ICD-10-CM

## 2017-01-30 ENCOUNTER — Other Ambulatory Visit: Payer: Self-pay | Admitting: Nurse Practitioner

## 2017-01-30 DIAGNOSIS — E119 Type 2 diabetes mellitus without complications: Secondary | ICD-10-CM

## 2017-01-30 MED ORDER — GLIMEPIRIDE 2 MG PO TABS: ORAL_TABLET | ORAL | 0 refills | Status: DC

## 2017-02-01 ENCOUNTER — Encounter: Payer: Self-pay | Admitting: Family

## 2017-02-01 ENCOUNTER — Ambulatory Visit (INDEPENDENT_AMBULATORY_CARE_PROVIDER_SITE_OTHER): Payer: Medicare Other | Admitting: Family

## 2017-02-01 VITALS — BP 147/88 | HR 95 | Temp 97.1°F | Ht 64.0 in | Wt 196.0 lb

## 2017-02-01 DIAGNOSIS — N3001 Acute cystitis with hematuria: Secondary | ICD-10-CM | POA: Diagnosis not present

## 2017-02-01 DIAGNOSIS — N3000 Acute cystitis without hematuria: Secondary | ICD-10-CM

## 2017-02-01 DIAGNOSIS — R399 Unspecified symptoms and signs involving the genitourinary system: Secondary | ICD-10-CM

## 2017-02-01 MED ORDER — NITROFURANTOIN MONOHYD MACRO 100 MG PO CAPS: 100.0000 mg | ORAL_CAPSULE | Freq: Two times a day (BID) | ORAL | 0 refills | Status: DC

## 2017-02-01 MED ORDER — UROGESIC-BLUE 81.6 MG PO TABS: 1.0000 | ORAL_TABLET | Freq: Three times a day (TID) | ORAL | 0 refills | Status: DC | PRN

## 2017-02-01 NOTE — Progress Notes (Signed)
   Subjective:    Patient ID: Rita Payne, female    DOB: Jun 09, 1945, 72 y.o.   MRN: [id number]  Dysuria   The current episode started yesterday. The problem occurs every urination. The problem has been gradually worsening. The quality of the pain is described as burning. The pain is at a severity of 8/10. The pain is moderate. There has been no fever. Associated symptoms include flank pain, frequency, hesitancy and urgency. Pertinent negatives include no hematuria, nausea or vomiting. She has tried increased fluids for the symptoms. The treatment provided mild relief.      Review of Systems  Gastrointestinal: Negative for nausea and vomiting.  Genitourinary: Positive for dysuria, flank pain, frequency, hesitancy and urgency. Negative for hematuria.  All other systems reviewed and are negative.      Objective:   Physical Exam  Constitutional: She is oriented to person, place, and time. She appears well-developed and well-nourished. No distress.  HENT:  Head: Normocephalic.  Eyes: Pupils are equal, round, and reactive to light.  Neck: Normal range of motion. Neck supple. No thyromegaly present.  Cardiovascular: Normal rate, regular rhythm, normal heart sounds and intact distal pulses.  No murmur heard. Pulmonary/Chest: Effort normal and breath sounds normal. No respiratory distress. She has no wheezes.  Abdominal: Soft. Bowel sounds are normal. She exhibits no distension. There is no tenderness.  Musculoskeletal: Normal range of motion. She exhibits no edema or tenderness.  Mild left CVA tenderness  Neurological: She is alert and oriented to person, place, and time.  Skin: Skin is warm and dry.  Psychiatric: She has a normal mood and affect. Her behavior is normal. Judgment and thought content normal.  Vitals reviewed.    BP (!) 147/88   Pulse 95   Temp (!) 97.1 F (36.2 C) (Oral)   Ht 5\' 4"  (1.626 m)   Wt 196 lb (88.9 kg)   LMP 04/14/1982   BMI 33.64 kg/m        Assessment & Plan:  1. UTI symptoms - Urinalysis - Urine Culture - nitrofurantoin, macrocrystal-monohydrate, (MACROBID) 100 MG capsule; Take 1 capsule (100 mg total) by mouth 2 (two) times daily.  Dispense: 14 capsule; Refill: 0  2. Acute cystitis with hematuria Force fluids AZO over the counter X2 days RTO prn Culture pending - nitrofurantoin, macrocrystal-monohydrate, (MACROBID) 100 MG capsule; Take 1 capsule (100 mg total) by mouth 2 (two) times daily.  Dispense: 14 capsule; Refill: 0   Evelina Dun, FNP

## 2017-02-01 NOTE — Addendum Note (Signed)
Addended by: Evelina Dun A on: 02/01/2017 10:30 AM   Modules accepted: Orders

## 2017-02-01 NOTE — Patient Instructions (Signed)

## 2017-02-03 LAB — URINALYSIS
Bilirubin, UA: NEGATIVE
GLUCOSE, UA: NEGATIVE
KETONES UA: NEGATIVE
Nitrite, UA: NEGATIVE
Protein, UA: NEGATIVE
SPEC GRAV UA: 1.01 (ref 1.005–1.030)
UUROB: 0.2 mg/dL (ref 0.2–1.0)
pH, UA: 6.5 (ref 5.0–7.5)

## 2017-02-04 LAB — URINE CULTURE

## 2017-02-14 ENCOUNTER — Ambulatory Visit (INDEPENDENT_AMBULATORY_CARE_PROVIDER_SITE_OTHER): Payer: Medicare Other | Admitting: Nurse Practitioner

## 2017-02-14 ENCOUNTER — Encounter: Payer: Self-pay | Admitting: Nurse Practitioner

## 2017-02-14 VITALS — BP 135/84 | HR 87 | Temp 97.4°F | Ht 64.0 in | Wt 197.0 lb

## 2017-02-14 DIAGNOSIS — M810 Age-related osteoporosis without current pathological fracture: Secondary | ICD-10-CM

## 2017-02-14 DIAGNOSIS — G8929 Other chronic pain: Secondary | ICD-10-CM

## 2017-02-14 DIAGNOSIS — E119 Type 2 diabetes mellitus without complications: Secondary | ICD-10-CM

## 2017-02-14 DIAGNOSIS — F17200 Nicotine dependence, unspecified, uncomplicated: Secondary | ICD-10-CM | POA: Diagnosis not present

## 2017-02-14 DIAGNOSIS — I10 Essential (primary) hypertension: Secondary | ICD-10-CM

## 2017-02-14 DIAGNOSIS — E559 Vitamin D deficiency, unspecified: Secondary | ICD-10-CM

## 2017-02-14 DIAGNOSIS — J4520 Mild intermittent asthma, uncomplicated: Secondary | ICD-10-CM | POA: Diagnosis not present

## 2017-02-14 DIAGNOSIS — Z6833 Body mass index (BMI) 33.0-33.9, adult: Secondary | ICD-10-CM | POA: Diagnosis not present

## 2017-02-14 DIAGNOSIS — M25562 Pain in left knee: Secondary | ICD-10-CM | POA: Diagnosis not present

## 2017-02-14 DIAGNOSIS — E785 Hyperlipidemia, unspecified: Secondary | ICD-10-CM

## 2017-02-14 DIAGNOSIS — K219 Gastro-esophageal reflux disease without esophagitis: Secondary | ICD-10-CM

## 2017-02-14 LAB — BAYER DCA HB A1C WAIVED: HB A1C (BAYER DCA - WAIVED): 7.7 % — ABNORMAL HIGH (ref <7.0)

## 2017-02-14 MED ORDER — ROSUVASTATIN CALCIUM 40 MG PO TABS: 40.0000 mg | ORAL_TABLET | Freq: Every day | ORAL | 1 refills | Status: DC

## 2017-02-14 MED ORDER — GLIMEPIRIDE 2 MG PO TABS
ORAL_TABLET | ORAL | 1 refills | Status: DC
Start: 2017-02-14 — End: 2017-05-20

## 2017-02-14 MED ORDER — METFORMIN HCL 500 MG PO TABS: 500.0000 mg | ORAL_TABLET | Freq: Two times a day (BID) | ORAL | 1 refills | Status: DC

## 2017-02-14 MED ORDER — FLUTICASONE-SALMETEROL 250-50 MCG/DOSE IN AEPB: INHALATION_SPRAY | RESPIRATORY_TRACT | 1 refills | Status: DC

## 2017-02-14 MED ORDER — PANTOPRAZOLE SODIUM 40 MG PO TBEC: 40.0000 mg | DELAYED_RELEASE_TABLET | Freq: Every day | ORAL | 1 refills | Status: DC

## 2017-02-14 MED ORDER — TELMISARTAN-HCTZ 80-25 MG PO TABS: 1.0000 | ORAL_TABLET | Freq: Every morning | ORAL | 1 refills | Status: DC

## 2017-02-14 NOTE — Progress Notes (Signed)
Subjective:    Patient ID: Rita Payne, female    DOB: December 07, 1945, 72 y.o.   MRN: [id number]  HPI   Rita Payne is here today for follow up of chronic medical problem.  Outpatient Encounter Medications as of 02/14/2017  Medication Sig  . aspirin 81 MG tablet Take 1 tablet (81 mg total) by mouth daily. Resume 4 days post-op  . celecoxib (CELEBREX) 200 MG capsule TAKE 1 CAPSULE EVERY 12 HOURS  . cetirizine (ZYRTEC) 10 MG tablet TAKE 1 TABLET DAILY  . cholecalciferol (VITAMIN D) 1000 UNITS tablet Take 1,000 Units by mouth daily.  Marland Kitchen CRANBERRY PO Take by mouth.  . Fluticasone-Salmeterol (ADVAIR DISKUS) 250-50 MCG/DOSE AEPB USE 1 INHALATION EVERY 12 HOURS  . glimepiride (AMARYL) 2 MG tablet 1 po bid  . glucose blood (FREESTYLE LITE) test strip Test qd. DX: E11.9  . metFORMIN (GLUCOPHAGE) 500 MG tablet TAKE 1 TABLET TWICE A DAY WITH MEALS  . Methen-Hyosc-Meth Blue-Na Phos (UROGESIC-BLUE) 81.6 MG TABS Take 1 tablet (81.6 mg total) by mouth 3 (three) times daily as needed.  Marland Kitchen MICARDIS HCT 80-25 MG tablet TAKE 1 TABLET EVERY MORNING  . pantoprazole (PROTONIX) 40 MG tablet Take 1 tablet (40 mg total) by mouth daily.  Marland Kitchen PROAIR HFA 108 (90 Base) MCG/ACT inhaler USE 2 INHALATIONS EVERY 6 HOURS AS NEEDED FOR WHEEZING  . rosuvastatin (CRESTOR) 40 MG tablet Take 1 tablet (40 mg total) by mouth at bedtime.  . TRICOR 145 MG tablet TAKE 1 TABLET DAILY  . VAGIFEM 10 MCG TABS vaginal tablet      1. Essential hypertension  No c/o chest pain , sob or headache. Does not check blood pressure at home. BP Readings from Last 3 Encounters:  02/01/17 (!) 147/88  11/08/16 121/75  07/31/16 125/78     2. Gastroesophageal reflux disease without esophagitis  Has to take protonix daily in order to keep symptoms under control.  3. Type 2 diabetes mellitus without complication, without long-term current use of insulin ([place]) last hgba1c was 9.1%. We increased her metformin to 1000bid. She had to stop taking '1000mg'$  in  morning because was causing diarrhea. So she has been taking '500mg'$  in morning and '1000mg'$  at night. Fasting blood sugars have come down to 12-140.  4. Senile osteoporosis  denies any back pain. Does very little exercising.  5. Hyperlipidemia with target LDL less than 100  Does not watch diet at all  6. Smoker  Really does not want to quit  7. BMI 33.0-33.9,adult  No recent weight chamges  8. Vitamin D deficiency  Takes vitamin d supplement daily    New complaints: Right arm aches and left knee hurts all the time- she says it is time to see ortho.  Social  still working part time as a Radio broadcast assistant  Review of Systems  Constitutional: Negative.  Negative for activity change and appetite change.  HENT: Negative.   Eyes: Negative for pain.  Respiratory: Negative for shortness of breath.   Cardiovascular: Negative for chest pain, palpitations and leg swelling.  Gastrointestinal: Negative for abdominal pain.  Endocrine: Negative for polydipsia.  Genitourinary: Negative.   Skin: Negative for rash.  Neurological: Negative for dizziness, weakness and headaches.  Hematological: Does not bruise/bleed easily.  Psychiatric/Behavioral: Negative.   All other systems reviewed and are negative.      Objective:   Physical Exam  Constitutional: She is oriented to person, place, and time. She appears well-developed and well-nourished.  HENT:  Nose: Nose normal.  Mouth/Throat: Oropharynx is clear and moist.  Eyes: EOM are normal.  Neck: Trachea normal, normal range of motion and full passive range of motion without pain. Neck supple. No JVD present. Carotid bruit is not present. No thyromegaly present.  Cardiovascular: Normal rate, regular rhythm, normal heart sounds and intact distal pulses. Exam reveals no gallop and no friction rub.  No murmur heard. Pulmonary/Chest: Effort normal and breath sounds normal.  Abdominal: Soft. Bowel sounds are normal. She exhibits no distension and no mass.  There is no tenderness.  Musculoskeletal: Normal range of motion.  FROM of left kne with crepitus on flexion and extension  Lymphadenopathy:    She has no cervical adenopathy.  Neurological: She is alert and oriented to person, place, and time. She has normal reflexes.  Skin: Skin is warm and dry.  Psychiatric: She has a normal mood and affect. Her behavior is normal. Judgment and thought content normal.   BP 135/84   Pulse 87   Temp (!) 97.4 F (36.3 C) (Oral)   Ht '5\' 4"'$  (1.626 m)   Wt 197 lb (89.4 kg)   LMP 04/14/1982   BMI 33.81 kg/m   hgba1c 7.7% today      1. Essential hypertension Low sodium diet - CMP14+EGFR - telmisartan-hydrochlorothiazide (MICARDIS HCT) 80-25 MG tablet; Take 1 tablet by mouth every morning.  Dispense: 90 tablet; Refill: 1  2. Gastroesophageal reflux disease without esophagitis Avoid spicy foods Do not eat 2 hours prior to bedtime - pantoprazole (PROTONIX) 40 MG tablet; Take 1 tablet (40 mg total) by mouth daily.  Dispense: 90 tablet; Refill: 1  3. Type 2 diabetes mellitus without complication, without long-term current use of insulin ([facility]) Continue to watch carbs in diet - Bayer DCA Hb A1c Waived - glimepiride (AMARYL) 2 MG tablet; 1 po bid  Dispense: 180 tablet; Refill: 1 - metFORMIN (GLUCOPHAGE) 500 MG tablet; Take 1 tablet (500 mg total) by mouth 2 (two) times daily with a meal.  Dispense: 180 tablet; Refill: 1  4. Senile osteoporosis Weight bearing exeercise  5. Hyperlipidemia with target LDL less than 100 Low fat diet - Lipid panel - rosuvastatin (CRESTOR) 40 MG tablet; Take 1 tablet (40 mg total) by mouth at bedtime.  Dispense: 90 tablet; Refill: 1  6. Smoker Smoking cessation encouraged  7. BMI 33.0-33.9,adult Discussed diet and exercise for person with BMI >25 Will recheck weight in 3-6 months  8. Vitamin D deficiency - VITAMIN D 25 Hydroxy (Vit-D Deficiency, Fractures)  9. Asthma, chronic, mild intermittent, uncomplicated -  Fluticasone-Salmeterol (ADVAIR DISKUS) 250-50 MCG/DOSE AEPB; USE 1 INHALATION EVERY 12 HOURS  Dispense: 180 each; Refill: 1    Labs pending Health maintenance reviewed Diet and exercise encouraged Continue all meds Follow up  In 3 months   Gregory, FNP

## 2017-02-14 NOTE — Patient Instructions (Signed)
Diarrhea, Adult °Diarrhea is when you have loose and water poop (stool) often. Diarrhea can make you feel weak and cause you to get dehydrated. Dehydration can make you tired and thirsty, make you have a dry mouth, and make it so you pee (urinate) less often. Diarrhea often lasts 2-3 days. However, it can last longer if it is a sign of something more serious. It is important to treat your diarrhea as told by your doctor. °Follow these instructions at home: °Eating and drinking ° °Follow these recommendations as told by your doctor: °· Take an oral rehydration solution (ORS). This is a drink that is sold at pharmacies and stores. °· Drink clear fluids, such as: °? Water. °? Ice chips. °? Diluted fruit juice. °? Low-calorie sports drinks. °· Eat bland, easy-to-digest foods in small amounts as you are able. These foods include: °? Bananas. °? Applesauce. °? Rice. °? Low-fat (lean) meats. °? Toast. °? Crackers. °· Avoid drinking fluids that have a lot of sugar or caffeine in them. °· Avoid alcohol. °· Avoid spicy or fatty foods. ° °General instructions ° °· Drink enough fluid to keep your pee (urine) clear or pale yellow. °· Wash your hands often. If you cannot use soap and water, use hand sanitizer. °· Make sure that all people in your home wash their hands well and often. °· Take over-the-counter and prescription medicines only as told by your doctor. °· Rest at home while you get better. °· Watch your condition for any changes. °· Take a warm bath to help with any burning or pain from having diarrhea. °· Keep all follow-up visits as told by your doctor. This is important. °Contact a doctor if: °· You have a fever. °· Your diarrhea gets worse. °· You have new symptoms. °· You cannot keep fluids down. °· You feel light-headed or dizzy. °· You have a headache. °· You have muscle cramps. °Get help right away if: °· You have chest pain. °· You feel very weak or you pass out (faint). °· You have bloody or black poop or  poop that look like tar. °· You have very bad pain, cramping, or bloating in your belly (abdomen). °· You have trouble breathing or you are breathing very quickly. °· Your heart is beating very quickly. °· Your skin feels cold and clammy. °· You feel confused. °· You have signs of dehydration, such as: °? Dark pee, hardly any pee, or no pee. °? Cracked lips. °? Dry mouth. °? Sunken eyes. °? Sleepiness. °? Weakness. °This information is not intended to replace advice given to you by your health care provider. Make sure you discuss any questions you have with your health care provider. °Document Released: 06/12/2007 Document Revised: 07/14/2015 Document Reviewed: 08/30/2014 °Elsevier Interactive Patient Education © 2018 [employer] ° °

## 2017-02-14 NOTE — Addendum Note (Signed)
Addended by: Chevis Pretty on: 02/14/2017 08:52 AM   Modules accepted: Orders

## 2017-02-15 LAB — CMP14+EGFR
A/G RATIO: 1.8 (ref 1.2–2.2)
ALK PHOS: 69 IU/L (ref 39–117)
ALT: 29 IU/L (ref 0–32)
AST: 27 IU/L (ref 0–40)
Albumin: 4.2 g/dL (ref 3.6–4.8)
BUN/Creatinine Ratio: 18 (ref 12–28)
BUN: 12 mg/dL (ref 8–27)
Bilirubin Total: 0.3 mg/dL (ref 0.0–1.2)
CO2: 23 mmol/L (ref 20–29)
CREATININE: 0.65 mg/dL (ref 0.57–1.00)
Calcium: 9.2 mg/dL (ref 8.7–10.3)
Chloride: 99 mmol/L (ref 96–106)
GFR calc Af Amer: 105 mL/min/{1.73_m2} (ref >59)
GFR calc non Af Amer: 91 mL/min/{1.73_m2} (ref >59)
GLOBULIN, TOTAL: 2.4 g/dL (ref 1.5–4.5)
Glucose: 165 mg/dL — ABNORMAL HIGH (ref 65–99)
POTASSIUM: 4.1 mmol/L (ref 3.5–5.2)
SODIUM: 140 mmol/L (ref 134–144)
Total Protein: 6.6 g/dL (ref 6.0–8.5)

## 2017-02-15 LAB — LIPID PANEL
CHOLESTEROL TOTAL: 188 mg/dL (ref 100–199)
Chol/HDL Ratio: 6.1 ratio — ABNORMAL HIGH (ref 0.0–4.4)
HDL: 31 mg/dL — ABNORMAL LOW (ref >39)
TRIGLYCERIDES: 415 mg/dL — AB (ref 0–149)

## 2017-02-15 LAB — VITAMIN D 25 HYDROXY (VIT D DEFICIENCY, FRACTURES): Vit D, 25-Hydroxy: 25.3 ng/mL — ABNORMAL LOW (ref 30.0–100.0)

## 2017-02-21 ENCOUNTER — Other Ambulatory Visit: Payer: Self-pay | Admitting: Nurse Practitioner

## 2017-03-02 ENCOUNTER — Other Ambulatory Visit: Payer: Self-pay | Admitting: Nurse Practitioner

## 2017-03-07 DIAGNOSIS — M25562 Pain in left knee: Secondary | ICD-10-CM | POA: Diagnosis not present

## 2017-03-07 DIAGNOSIS — M170 Bilateral primary osteoarthritis of knee: Secondary | ICD-10-CM | POA: Diagnosis not present

## 2017-03-28 ENCOUNTER — Ambulatory Visit (INDEPENDENT_AMBULATORY_CARE_PROVIDER_SITE_OTHER): Payer: Medicare Other | Admitting: Nurse Practitioner

## 2017-03-28 ENCOUNTER — Encounter: Payer: Self-pay | Admitting: Nurse Practitioner

## 2017-03-28 VITALS — BP 128/76 | HR 111 | Temp 97.3°F | Ht 64.0 in | Wt 194.0 lb

## 2017-03-28 DIAGNOSIS — N3000 Acute cystitis without hematuria: Secondary | ICD-10-CM

## 2017-03-28 DIAGNOSIS — R300 Dysuria: Secondary | ICD-10-CM

## 2017-03-28 DIAGNOSIS — N3001 Acute cystitis with hematuria: Secondary | ICD-10-CM

## 2017-03-28 LAB — URINALYSIS, COMPLETE
Bilirubin, UA: NEGATIVE
GLUCOSE, UA: NEGATIVE
Nitrite, UA: POSITIVE — AB
SPEC GRAV UA: 1.02 (ref 1.005–1.030)
Urobilinogen, Ur: 1 mg/dL (ref 0.2–1.0)
pH, UA: 6 (ref 5.0–7.5)

## 2017-03-28 LAB — MICROSCOPIC EXAMINATION
RBC MICROSCOPIC, UA: NONE SEEN /HPF (ref 0–2)
WBC, UA: >30 /hpf — ABNORMAL HIGH (ref 0–5)

## 2017-03-28 MED ORDER — CIPROFLOXACIN HCL 500 MG PO TABS: 500.0000 mg | ORAL_TABLET | Freq: Two times a day (BID) | ORAL | 0 refills | Status: DC

## 2017-03-28 MED ORDER — UROGESIC-BLUE 81.6 MG PO TABS: 1.0000 | ORAL_TABLET | Freq: Three times a day (TID) | ORAL | 0 refills | Status: DC | PRN

## 2017-03-28 NOTE — Patient Instructions (Signed)

## 2017-03-28 NOTE — Addendum Note (Signed)
Addended by: Chevis Pretty on: 03/28/2017 03:48 PM   Modules accepted: Orders

## 2017-03-28 NOTE — Progress Notes (Signed)
   Subjective:    Patient ID: Rita Payne, female    DOB: [birth year], 72 y.o.   MRN: [id number]  HPI Patient in  today c/o dysuria frequency and urgency. Started all of the sudden yesterday evening.     Review of Systems  Constitutional: Negative.   Respiratory: Negative.   Cardiovascular: Negative.   Gastrointestinal: Negative for abdominal pain.  Genitourinary: Positive for dysuria, frequency and urgency.  Neurological: Negative.   Psychiatric/Behavioral: Negative.   All other systems reviewed and are negative.      Objective:   Physical Exam  Constitutional: She is oriented to person, place, and time. She appears well-developed and well-nourished. No distress.  Cardiovascular: Normal rate and regular rhythm.  Pulmonary/Chest: Effort normal and breath sounds normal.  Abdominal: Soft. Bowel sounds are normal. There is no tenderness.  Genitourinary:  Genitourinary Comments: No CVA tenderness  Neurological: She is alert and oriented to person, place, and time.  Skin: Skin is warm.  Psychiatric: She has a normal mood and affect. Her behavior is normal. Judgment and thought content normal.   BP 128/76   Pulse (!) 111   Temp (!) 97.3 F (36.3 C) (Oral)   Ht 5\' 4"  (1.626 m)   Wt 194 lb (88 kg)   LMP 04/14/1982   BMI 33.30 kg/m   Ua ( + ) leuks >30 WBC       Assessment & Plan:   1. Dysuria   2. Acute cystitis with hematuria    Meds ordered this encounter  Medications  . ciprofloxacin (CIPRO) 500 MG tablet    Sig: Take 1 tablet (500 mg total) by mouth 2 (two) times daily.    Dispense:  10 tablet    Refill:  0    Order Specific Question:   Supervising Provider    Answer:   Evette Doffing, CAROL L [[id number]]   Orders Placed This Encounter  Procedures  . Urine Culture  . Urinalysis, Complete   Take medication as prescribe Cotton underwear Take shower not bath Cranberry juice, yogurt Force fluids AZO over the counter X2 days Culture pending RTO prn  Mary-Margaret  Hassell Done, FNP

## 2017-03-30 LAB — URINE CULTURE

## 2017-04-04 DIAGNOSIS — M1712 Unilateral primary osteoarthritis, left knee: Secondary | ICD-10-CM | POA: Diagnosis not present

## 2017-04-04 DIAGNOSIS — M76899 Other specified enthesopathies of unspecified lower limb, excluding foot: Secondary | ICD-10-CM | POA: Diagnosis not present

## 2017-04-11 DIAGNOSIS — M1712 Unilateral primary osteoarthritis, left knee: Secondary | ICD-10-CM | POA: Diagnosis not present

## 2017-04-13 DIAGNOSIS — M179 Osteoarthritis of knee, unspecified: Secondary | ICD-10-CM | POA: Insufficient documentation

## 2017-04-13 DIAGNOSIS — M1710 Unilateral primary osteoarthritis, unspecified knee: Secondary | ICD-10-CM | POA: Insufficient documentation

## 2017-04-18 DIAGNOSIS — M1712 Unilateral primary osteoarthritis, left knee: Secondary | ICD-10-CM | POA: Diagnosis not present

## 2017-05-20 ENCOUNTER — Encounter: Payer: Self-pay | Admitting: Nurse Practitioner

## 2017-05-20 ENCOUNTER — Ambulatory Visit (INDEPENDENT_AMBULATORY_CARE_PROVIDER_SITE_OTHER): Payer: Medicare Other | Admitting: Nurse Practitioner

## 2017-05-20 VITALS — BP 133/77 | HR 99 | Temp 97.2°F | Ht 64.0 in | Wt 190.0 lb

## 2017-05-20 DIAGNOSIS — K219 Gastro-esophageal reflux disease without esophagitis: Secondary | ICD-10-CM

## 2017-05-20 DIAGNOSIS — Z6833 Body mass index (BMI) 33.0-33.9, adult: Secondary | ICD-10-CM | POA: Diagnosis not present

## 2017-05-20 DIAGNOSIS — I10 Essential (primary) hypertension: Secondary | ICD-10-CM | POA: Diagnosis not present

## 2017-05-20 DIAGNOSIS — Z1212 Encounter for screening for malignant neoplasm of rectum: Secondary | ICD-10-CM | POA: Diagnosis not present

## 2017-05-20 DIAGNOSIS — F17200 Nicotine dependence, unspecified, uncomplicated: Secondary | ICD-10-CM

## 2017-05-20 DIAGNOSIS — E119 Type 2 diabetes mellitus without complications: Secondary | ICD-10-CM

## 2017-05-20 DIAGNOSIS — E785 Hyperlipidemia, unspecified: Secondary | ICD-10-CM

## 2017-05-20 DIAGNOSIS — E559 Vitamin D deficiency, unspecified: Secondary | ICD-10-CM | POA: Diagnosis not present

## 2017-05-20 DIAGNOSIS — Z1211 Encounter for screening for malignant neoplasm of colon: Secondary | ICD-10-CM

## 2017-05-20 LAB — LIPID PANEL
CHOL/HDL RATIO: 5 ratio — AB (ref 0.0–4.4)
Cholesterol, Total: 154 mg/dL (ref 100–199)
HDL: 31 mg/dL — ABNORMAL LOW (ref >39)
LDL CALC: 60 mg/dL (ref 0–99)
Triglycerides: 316 mg/dL — ABNORMAL HIGH (ref 0–149)
VLDL CHOLESTEROL CAL: 63 mg/dL — AB (ref 5–40)

## 2017-05-20 LAB — BAYER DCA HB A1C WAIVED: HB A1C: 6.4 % (ref <7.0)

## 2017-05-20 LAB — CMP14+EGFR
ALT: 29 IU/L (ref 0–32)
AST: 25 IU/L (ref 0–40)
Albumin/Globulin Ratio: 2.1 (ref 1.2–2.2)
Albumin: 4.6 g/dL (ref 3.5–4.8)
Alkaline Phosphatase: 63 IU/L (ref 39–117)
BUN / CREAT RATIO: 19 (ref 12–28)
BUN: 14 mg/dL (ref 8–27)
Bilirubin Total: 0.3 mg/dL (ref 0.0–1.2)
CALCIUM: 9.9 mg/dL (ref 8.7–10.3)
CO2: 23 mmol/L (ref 20–29)
CREATININE: 0.74 mg/dL (ref 0.57–1.00)
Chloride: 97 mmol/L (ref 96–106)
GFR calc Af Amer: 95 mL/min/{1.73_m2} (ref >59)
GFR, EST NON AFRICAN AMERICAN: 82 mL/min/{1.73_m2} (ref >59)
GLOBULIN, TOTAL: 2.2 g/dL (ref 1.5–4.5)
Glucose: 143 mg/dL — ABNORMAL HIGH (ref 65–99)
Potassium: 4.1 mmol/L (ref 3.5–5.2)
SODIUM: 138 mmol/L (ref 134–144)
Total Protein: 6.8 g/dL (ref 6.0–8.5)

## 2017-05-20 MED ORDER — FENOFIBRATE 145 MG PO TABS: 145.0000 mg | ORAL_TABLET | Freq: Every day | ORAL | 1 refills | Status: DC

## 2017-05-20 MED ORDER — TELMISARTAN-HCTZ 80-25 MG PO TABS: 1.0000 | ORAL_TABLET | Freq: Every morning | ORAL | 1 refills | Status: DC

## 2017-05-20 MED ORDER — ROSUVASTATIN CALCIUM 40 MG PO TABS: 40.0000 mg | ORAL_TABLET | Freq: Every day | ORAL | 1 refills | Status: DC

## 2017-05-20 MED ORDER — GLIMEPIRIDE 2 MG PO TABS: ORAL_TABLET | ORAL | 1 refills | Status: DC

## 2017-05-20 MED ORDER — METFORMIN HCL 500 MG PO TABS: 500.0000 mg | ORAL_TABLET | Freq: Two times a day (BID) | ORAL | 1 refills | Status: DC

## 2017-05-20 MED ORDER — PANTOPRAZOLE SODIUM 40 MG PO TBEC: 40.0000 mg | DELAYED_RELEASE_TABLET | Freq: Every day | ORAL | 1 refills | Status: DC

## 2017-05-20 NOTE — Progress Notes (Signed)
 Subjective:    Patient ID: Rita Payne, female    DOB: 04/05/1945, 72 y.o.   MRN: [id number]   Chief Complaint: Medical Management of chronic issues  HPI:  1. Essential hypertension  No c/o chest pain, sob or headache. Does not check blood pressures at home. BP Readings from Last 3 Encounters:  03/28/17 128/76  02/14/17 135/84  02/01/17 (!) 147/88     2. Gastroesophageal reflux disease without esophagitis  Patient is on protonix daily which does well to keep symptoms under control.  3. Type 2 diabetes mellitus without complication, without long-term current use of insulin (HCC) last HGBA1c was 7.7%. Fasting blood sugars averaging around 120. No hypoglycemia that she is aware of.  4. Vitamin D deficiency  Takes vitamin d supplement daily OTC  5. Smoker  Smokes about 1/2 pack a day. does not want to quit right now.  6. Hyperlipidemia with target LDL less than 100  Not watching diet and does very little exercise.  7. BMI 33.0-33.9,adult  No recent weight changes    Outpatient Encounter Medications as of 05/20/2017  Medication Sig  . aspirin 81 MG tablet Take 1 tablet (81 mg total) by mouth daily. Resume 4 days post-op  . celecoxib (CELEBREX) 200 MG capsule TAKE 1 CAPSULE EVERY 12 HOURS  . cetirizine (ZYRTEC) 10 MG tablet TAKE 1 TABLET DAILY  . cholecalciferol (VITAMIN D) 1000 UNITS tablet Take 1,000 Units by mouth daily.  . ciprofloxacin (CIPRO) 500 MG tablet Take 1 tablet (500 mg total) by mouth 2 (two) times daily.  . CRANBERRY PO Take by mouth.  . Fluticasone-Salmeterol (ADVAIR DISKUS) 250-50 MCG/DOSE AEPB USE 1 INHALATION EVERY 12 HOURS  . glimepiride (AMARYL) 2 MG tablet 1 po bid  . glucose blood (FREESTYLE LITE) test strip Test qd. DX: E11.9  . metFORMIN (GLUCOPHAGE) 500 MG tablet Take 1 tablet (500 mg total) by mouth 2 (two) times daily with a meal.  . Methen-Hyosc-Meth Blue-Na Phos (UROGESIC-BLUE) 81.6 MG TABS Take 1 tablet (81.6 mg total) by mouth 3 (three) times  daily as needed.  . pantoprazole (PROTONIX) 40 MG tablet Take 1 tablet (40 mg total) by mouth daily.  . PROAIR HFA 108 (90 Base) MCG/ACT inhaler USE 2 INHALATIONS EVERY 6 HOURS AS NEEDED FOR WHEEZING  . rosuvastatin (CRESTOR) 40 MG tablet Take 1 tablet (40 mg total) by mouth at bedtime.  . telmisartan-hydrochlorothiazide (MICARDIS HCT) 80-25 MG tablet Take 1 tablet by mouth every morning.  . TRICOR 145 MG tablet TAKE 1 TABLET DAILY  . VAGIFEM 10 MCG TABS vaginal tablet       New complaints: None today  Social history: Works part time as a paralegal    Review of Systems  Constitutional: Negative for activity change and appetite change.  HENT: Negative.   Eyes: Negative for pain.  Respiratory: Negative for shortness of breath.   Cardiovascular: Negative for chest pain, palpitations and leg swelling.  Gastrointestinal: Negative for abdominal pain.  Endocrine: Negative for polydipsia.  Genitourinary: Negative.   Skin: Negative for rash.  Neurological: Negative for dizziness, weakness and headaches.  Hematological: Does not bruise/bleed easily.  Psychiatric/Behavioral: Negative.   All other systems reviewed and are negative.      Objective:   Physical Exam  Constitutional: She is oriented to person, place, and time.  HENT:  Head: Normocephalic.  Nose: Nose normal.  Mouth/Throat: Oropharynx is clear and moist.  Eyes: Pupils are equal, round, and reactive to light. EOM are normal.  Neck: Normal   range of motion. Neck supple. No JVD present. Carotid bruit is not present.  Cardiovascular: Normal rate, regular rhythm, normal heart sounds and intact distal pulses.  Pulmonary/Chest: Effort normal and breath sounds normal. No respiratory distress. She has no wheezes. She has no rales. She exhibits no tenderness.  Abdominal: Soft. Normal appearance, normal aorta and bowel sounds are normal. She exhibits no distension, no abdominal bruit, no pulsatile midline mass and no mass. There is  no splenomegaly or hepatomegaly. There is no tenderness.  Musculoskeletal: Normal range of motion. She exhibits no edema.  Lymphadenopathy:    She has no cervical adenopathy.  Neurological: She is alert and oriented to person, place, and time. She has normal reflexes.  Skin: Skin is warm and dry.  Psychiatric: She has a normal mood and affect. Her behavior is normal. Judgment and thought content normal.   BP 133/77   Pulse 99   Temp (!) 97.2 F (36.2 C) (Oral)   Ht 5' 4" (1.626 m)   Wt 190 lb (86.2 kg)   LMP 04/14/1982   BMI 32.61 kg/m   HGBAc1 % today     Assessment & Plan:  Rita Payne comes in today with chief complaint of Medical Management of Chronic Issues   Diagnosis and orders addressed:  1. Essential hypertension Low sodium diet - CMP14+EGFR - telmisartan-hydrochlorothiazide (MICARDIS HCT) 80-25 MG tablet; Take 1 tablet by mouth every morning.  Dispense: 90 tablet; Refill: 1  2. Gastroesophageal reflux disease without esophagitis Avoid spicy foods Do not eat 2 hours prior to bedtime - pantoprazole (PROTONIX) 40 MG tablet; Take 1 tablet (40 mg total) by mouth daily.  Dispense: 90 tablet; Refill: 1  3. Type 2 diabetes mellitus without complication, without long-term current use of insulin (HCC) Continue to watch carbsin diet - Bayer DCA Hb A1c Waived - metFORMIN (GLUCOPHAGE) 500 MG tablet; Take 1 tablet (500 mg total) by mouth 2 (two) times daily with a meal.  Dispense: 180 tablet; Refill: 1 - glimepiride (AMARYL) 2 MG tablet; 1 po bid  Dispense: 180 tablet; Refill: 1  4. Vitamin D deficiency Continue vitamin d supplement   5. Smoker Encouraged dto stop smoking  6. Hyperlipidemia with target LDL less than 100 Low fat diet - Lipid panel - rosuvastatin (CRESTOR) 40 MG tablet; Take 1 tablet (40 mg total) by mouth at bedtime.  Dispense: 90 tablet; Refill: 1  7. BMI 33.0-33.9,adult Discussed diet and exercise for person with BMI >25 Will recheck weight in 3-6  months  cologuard ordered Labs pending Health Maintenance reviewed Diet and exercise encouraged  Follow up plan: 3 months   Mary-Margaret Martin, FNP  

## 2017-05-20 NOTE — Patient Instructions (Signed)
Steps to Quit Smoking Smoking tobacco can be bad for your health. It can also affect almost every organ in your body. Smoking puts you and people around you at risk for many serious long-lasting (chronic) diseases. Quitting smoking is hard, but it is one of the best things that you can do for your health. It is never too late to quit. What are the benefits of quitting smoking? When you quit smoking, you lower your risk for getting serious diseases and conditions. They can include:  Lung cancer or lung disease.  Heart disease.  Stroke.  Heart attack.  Not being able to have children (infertility).  Weak bones (osteoporosis) and broken bones (fractures).  If you have coughing, wheezing, and shortness of breath, those symptoms may get better when you quit. You may also get sick less often. If you are pregnant, quitting smoking can help to lower your chances of having a baby of low birth weight. What can I do to help me quit smoking? Talk with your doctor about what can help you quit smoking. Some things you can do (strategies) include:  Quitting smoking totally, instead of slowly cutting back how much you smoke over a period of time.  Going to in-person counseling. You are more likely to quit if you go to many counseling sessions.  Using resources and support systems, such as: ? Online chats with a counselor. ? Phone quitlines. ? Printed self-help materials. ? Support groups or group counseling. ? Text messaging programs. ? Mobile phone apps or applications.  Taking medicines. Some of these medicines may have nicotine in them. If you are pregnant or breastfeeding, do not take any medicines to quit smoking unless your doctor says it is okay. Talk with your doctor about counseling or other things that can help you.  Talk with your doctor about using more than one strategy at the same time, such as taking medicines while you are also going to in-person counseling. This can help make  quitting easier. What things can I do to make it easier to quit? Quitting smoking might feel very hard at first, but there is a lot that you can do to make it easier. Take these steps:  Talk to your family and friends. Ask them to support and encourage you.  Call phone quitlines, reach out to support groups, or work with a counselor.  Ask people who smoke to not smoke around you.  Avoid places that make you want (trigger) to smoke, such as: ? Bars. ? Parties. ? Smoke-break areas at work.  Spend time with people who do not smoke.  Lower the stress in your life. Stress can make you want to smoke. Try these things to help your stress: ? Getting regular exercise. ? Deep-breathing exercises. ? Yoga. ? Meditating. ? Doing a body scan. To do this, close your eyes, focus on one area of your body at a time from head to toe, and notice which parts of your body are tense. Try to relax the muscles in those areas.  Download or buy apps on your mobile phone or tablet that can help you stick to your quit plan. There are many free apps, such as QuitGuide from the CDC (Centers for Disease Control and Prevention). You can find more support from smokefree.gov and other websites.  This information is not intended to replace advice given to you by your health care provider. Make sure you discuss any questions you have with your health care provider. Document Released: 10/20/2008 Document   Revised: 08/22/2015 Document Reviewed: 05/10/2014 Elsevier Interactive Patient Education  2018 Elsevier Inc.  

## 2017-06-01 ENCOUNTER — Other Ambulatory Visit: Payer: Self-pay | Admitting: Nurse Practitioner

## 2017-06-11 ENCOUNTER — Ambulatory Visit (INDEPENDENT_AMBULATORY_CARE_PROVIDER_SITE_OTHER): Payer: Medicare Other | Admitting: *Deleted

## 2017-06-11 ENCOUNTER — Encounter: Payer: Self-pay | Admitting: *Deleted

## 2017-06-11 VITALS — BP 153/84 | HR 86 | Ht 64.0 in | Wt 193.0 lb

## 2017-06-11 DIAGNOSIS — Z0000 Encounter for general adult medical examination without abnormal findings: Secondary | ICD-10-CM

## 2017-06-11 NOTE — Patient Instructions (Addendum)
Please work on your goal of quitting smoking.   At your convenience, please bring a copy of your Advance Directives to our office to be filed in your medical record.   Thank you for coming in for your Annual Wellness Visit today!!    Preventive Care 65 Years and Older, Female Preventive care refers to lifestyle choices and visits with your health care provider that can promote health and wellness. What does preventive care include?  A yearly physical exam. This is also called an annual well check.  Dental exams once or twice a year.  Routine eye exams. Ask your health care provider how often you should have your eyes checked.  Personal lifestyle choices, including: ? Daily care of your teeth and gums. ? Regular physical activity. ? Eating a healthy diet. ? Avoiding tobacco and drug use. ? Limiting alcohol use. ? Practicing safe sex. ? Taking low-dose aspirin every day. ? Taking vitamin and mineral supplements as recommended by your health care provider. What happens during an annual well check? The services and screenings done by your health care provider during your annual well check will depend on your age, overall health, lifestyle risk factors, and family history of disease. Counseling Your health care provider may ask you questions about your:  Alcohol use.  Tobacco use.  Drug use.  Emotional well-being.  Home and relationship well-being.  Sexual activity.  Eating habits.  History of falls.  Memory and ability to understand (cognition).  Work and work Statistician.  Reproductive health.  Screening You may have the following tests or measurements:  Height, weight, and BMI.  Blood pressure.  Lipid and cholesterol levels. These may be checked every 5 years, or more frequently if you are over 35 years old.  Skin check.  Lung cancer screening. You may have this screening every year starting at age 25 if you have a 30-pack-year history of smoking and  currently smoke or have quit within the past 15 years.  Fecal occult blood test (FOBT) of the stool. You may have this test every year starting at age 102.  Flexible sigmoidoscopy or colonoscopy. You may have a sigmoidoscopy every 5 years or a colonoscopy every 10 years starting at age 70.  Hepatitis C blood test.  Hepatitis B blood test.  Sexually transmitted disease (STD) testing.  Diabetes screening. This is done by checking your blood sugar (glucose) after you have not eaten for a while (fasting). You may have this done every 1-3 years.  Bone density scan. This is done to screen for osteoporosis. You may have this done starting at age 59.  Mammogram. This may be done every 1-2 years. Talk to your health care provider about how often you should have regular mammograms.  Talk with your health care provider about your test results, treatment options, and if necessary, the need for more tests. Vaccines Your health care provider may recommend certain vaccines, such as:  Influenza vaccine. This is recommended every year.  Tetanus, diphtheria, and acellular pertussis (Tdap, Td) vaccine. You may need a Td booster every 10 years.  Varicella vaccine. You may need this if you have not been vaccinated.  Zoster vaccine. You may need this after age 24.  Measles, mumps, and rubella (MMR) vaccine. You may need at least one dose of MMR if you were born in 1957 or later. You may also need a second dose.  Pneumococcal 13-valent conjugate (PCV13) vaccine. One dose is recommended after age 35.  Pneumococcal polysaccharide (PPSV23) vaccine.  One dose is recommended after age 33.  Meningococcal vaccine. You may need this if you have certain conditions.  Hepatitis A vaccine. You may need this if you have certain conditions or if you travel or work in places where you may be exposed to hepatitis A.  Hepatitis B vaccine. You may need this if you have certain conditions or if you travel or work in  places where you may be exposed to hepatitis B.  Haemophilus influenzae type b (Hib) vaccine. You may need this if you have certain conditions.  Talk to your health care provider about which screenings and vaccines you need and how often you need them. This information is not intended to replace advice given to you by your health care provider. Make sure you discuss any questions you have with your health care provider. Document Released: 01/20/2015 Document Revised: 09/13/2015 Document Reviewed: 10/25/2014 Elsevier Interactive Patient Education  [employer].

## 2017-06-12 NOTE — Progress Notes (Signed)
Subjective:   Rita Payne is a 72 y.o. female who presents for an Initial Medicare Annual Wellness Visit.  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  She works 3 days per week at an office in [place], [place] and works from home 5 nights per week for an office near [place], [place].  She lives alone, her husband passed away several years ago, she has 2 dogs.  Rita Payne has 2 children and 2 grandchildren who live in [place], [place].  She feels her health is about the same this year as it was last.  She reports no hospitalizations, ER visits, or surgeries in the past year.   Review of Systems     Musculoskeletal - left knee pain         Objective:    Today's Vitals   06/11/17 1049  BP: (!) 153/84  Pulse: 86  Weight: 193 lb (87.5 kg)  Height: _0  (1.626 m)  PainSc: 4   PainLoc: Knee   Body mass index is 33.13 kg/m.  Advanced Directives 06/11/2017 06/05/2016 10/03/2014 09/29/2014 12/08/2013 12/08/2013 11/30/2013  Does Patient Have a Medical Advance Directive? _1  Yes Yes  Type of Paramedic of [place];Living will Healthcare Power of [place];Living will - Living will;Advance instruction for mental health treatment Living will;Healthcare Power of Attorney Living will;Healthcare Power of Attorney  Does patient want to make changes to medical advance directive? - No - Patient declined No - Patient declined - No - Patient declined No - Patient declined No - Patient declined  Copy of [place] in Chart? No - copy requested No - copy requested No - copy requested No - copy requested No - copy requested No - copy requested No - copy requested  Pre-existing out of facility DNR order (yellow form or pink MOST form) - - - - - - -    Current Medications (verified) Outpatient Encounter Medications as of 06/11/2017  Medication Sig  . aspirin 81 MG tablet Take 1 tablet (81 mg total) by mouth daily. Resume  4 days post-op  . celecoxib (CELEBREX) 200 MG capsule TAKE 1 CAPSULE EVERY 12 HOURS  . cetirizine (ZYRTEC) 10 MG tablet TAKE 1 TABLET DAILY  . cholecalciferol (VITAMIN D) 1000 UNITS tablet Take 1,000 Units by mouth daily.  Marland Kitchen CRANBERRY PO Take 8,600 Units by mouth daily.   . fenofibrate (TRICOR) 145 MG tablet Take 1 tablet (145 mg total) by mouth daily.  . Fluticasone-Salmeterol (ADVAIR DISKUS) 250-50 MCG/DOSE AEPB USE 1 INHALATION EVERY 12 HOURS  . glimepiride (AMARYL) 2 MG tablet 1 po bid  . glucose blood (FREESTYLE LITE) test strip Test qd. DX: E11.9  . metFORMIN (GLUCOPHAGE) 500 MG tablet Take 1 tablet (500 mg total) by mouth 2 (two) times daily with a meal. (Patient taking differently: Take 500 mg by mouth. Take 1 tablet in the morning, and 2 tablets in the evenings.)  . pantoprazole (PROTONIX) 40 MG tablet Take 1 tablet (40 mg total) by mouth daily.  Marland Kitchen PROAIR HFA 108 (90 Base) MCG/ACT inhaler USE 2 INHALATIONS EVERY 6 HOURS AS NEEDED FOR WHEEZING  . rosuvastatin (CRESTOR) 40 MG tablet Take 1 tablet (40 mg total) by mouth at bedtime. (Patient taking differently: Take 40 mg by mouth every other day. )  . telmisartan-hydrochlorothiazide (MICARDIS HCT) 80-25 MG tablet Take 1 tablet by mouth every morning.  Marland Kitchen VAGIFEM 10 MCG TABS vaginal tablet  Place 10 mcg vaginally 2 (two) times a week.    Facility-Administered Encounter Medications as of 06/11/2017  Medication  . magnesium citrate solution 1 Bottle  . sodium phosphate (FLEET) 7-19 GM/118ML enema 1 enema    Allergies (verified) Ivp dye [iodinated diagnostic agents]; Shellfish allergy; Vasotec [enalapril]; Adhesive [tape]; Iodine; and Pyridium [phenazopyridine hcl]   History: Past Medical History:  Diagnosis Date  . Arthritis   . Basal cell carcinoma   . First degree heart block   . GERD (gastroesophageal reflux disease)   . History of kidney stones   . Hyperlipidemia   . Hypertension   . Melanoma ([place])   . Mild intermittent asthma    . Mild sleep apnea    mild sleep apnea no cpap ordered/ study approx. 2005  . SUI (stress urinary incontinence, female)   . Type 2 diabetes mellitus ([place])    Past Surgical History:  Procedure Laterality Date  . ABDOMINAL HYSTERECTOMY  1987  . BELPHAROPTOSIS REPAIR Bilateral 2009   9   . CHOLECYSTECTOMY  1989  . HERNIA REPAIR    . kidney tumor removal   1986  . KNEE ARTHROSCOPY Bilateral   . LUMBAR LAMINECTOMY/DECOMPRESSION MICRODISCECTOMY N/A 12/08/2013   Procedure: LUMBAR DECOMPRESSION L4-L5,  L3-L4 ;  Surgeon: Johnn Hai, MD;  Location: [facility];  Service: Orthopedics;  Laterality: N/A;  . PUBOVAGINAL SLING N/A 10/03/2014   Procedure: Gaynelle Arabian;  Surgeon: Carolan Clines, MD;  Location: [facility];  Service: Urology;  Laterality: N/A;  . TONSILLECTOMY    . TOTAL HIP ARTHROPLASTY Right 08/25/2012   Procedure: RIGHT TOTAL HIP ARTHROPLASTY ANTERIOR APPROACH;  Surgeon: [name], MD;  Location: [facility];  Service: Orthopedics;  Laterality: Right;   Family History  Problem Relation Age of Onset  . Diabetes Mother   . Heart disease Mother   . Stroke Mother   . Heart attack Mother   . Heart disease Father   . Stroke Father   . Vision loss Brother   . Arthritis/Rheumatoid Brother   . Diabetes Sister   . Heart disease Sister   . Heart failure Sister   . Diabetes Sister   . Stroke Sister   . Heart disease Brother   . Heart failure Brother   . Heart attack Brother   . Arrhythmia Brother   . Arthritis/Rheumatoid Sister    Social History   Socioeconomic History  . Marital status: Widowed    Spouse name: Not on file  . Number of children: Not on file  . Years of education: Not on file  . Highest education level: Not on file  Occupational History  . Not on file  Social Needs  . Financial resource strain: Not on file  . Food insecurity:    Worry: Not on file    Inability: Not on file  . Transportation needs:    Medical: Not on file     Non-medical: Not on file  Tobacco Use  . Smoking status: Current Every Day Smoker    Packs/day: 0.25    Years: 33.00    Pack years: 8.25    Types: Cigarettes  . Smokeless tobacco: Never Used  Substance and Sexual Activity  . Alcohol use: No  . Drug use: No  . Sexual activity: Not Currently  Lifestyle  . Physical activity:    Days per week: Not on file    Minutes per session: Not on file  . Stress: Not on file  Relationships  .  Social connections:    Talks on phone: Not on file    Gets together: Not on file    Attends religious service: Not on file    Active member of club or organization: Not on file    Attends meetings of clubs or organizations: Not on file    Relationship status: Not on file  Other Topics Concern  . Not on file  Social History Narrative  . Not on file    Tobacco Counseling Ready to quit: Yes Counseling given: Yes  Patient plans to discuss smoking cessation aids with Chevis Pretty, FNP at her next office visit.  Discussed setting a quit date and working towards that as a goal.   Clinical Intake:     Pain Score: 4                   Activities of Daily Living In your present state of health, do you have any difficulty performing the following activities: 06/11/2017  Hearing? N  Vision? N  Difficulty concentrating or making decisions? N  Walking or climbing stairs? Y  Comment Trouble climbing stairs due to left knee bursitis   Dressing or bathing? N  Doing errands, shopping? N  Preparing Food and eating ? N  Using the Toilet? N  In the past six months, have you accidently leaked urine? N  Do you have problems with loss of bowel control? N  Managing your Medications? N  Managing your Finances? N  Housekeeping or managing your Housekeeping? N  Some recent data might be hidden     Immunizations and Health Maintenance Immunization History  Administered Date(s) Administered  . Influenza, High Dose Seasonal PF 11/08/2016  .  Influenza,inj,Quad PF,6+ Mos 10/21/2012, 11/11/2013, 12/23/2014, 10/05/2015  . Pneumococcal Conjugate-13 06/01/2014  . Pneumococcal Polysaccharide-23 07/14/2012  . Tdap 07/15/2007   Health Maintenance Due  Topic Date Due  . Fecal DNA (Cologuard)  04/29/1997  Patient received the Cologuard kit in the mail.  She plans to complete the specimen collection and return to company as soon as possible.   Patient Care Team: Chevis Pretty, FNP as PCP - General (Family Medicine) Paralee Cancel, MD as Consulting Physician (Orthopedic Surgery) Susa Day, MD as Consulting Physician (Orthopedic Surgery) Sandford Craze, MD as Referring Physician (Dermatology) Carolan Clines, MD as Consulting Physician (Urology)       Assessment:   This is a routine wellness examination for Meryl.  Hearing/Vision screen No hearing deficit noted.  Patient has no trouble seeing with her glasses.  She goes to [facility] in [place] yearly for eye exams.  Her next one is due after 09/16/17.  Dietary issues and exercise activities discussed: Current Exercise Habits: The patient does not participate in regular exercise at present, Exercise limited by: orthopedic condition(s)   Patient states she eats 3 meals per day and snacks as needed.  She keeps her carbohydrate intake under control by avoiding sweets and breads.  She also avoids fried foods. Recommended a diet of mostly vegetables, lean proteins, and fruits and whole grains in moderation.   Goals    . Quit Smoking     Patient plans to talk with Chevis Pretty regarding smoking cessation aids, set a quit date and work toward quitting smoking.       Depression Screen PHQ 2/9 Scores 06/11/2017 05/20/2017 03/28/2017 02/14/2017 02/01/2017 11/08/2016 07/31/2016  PHQ - 2 Score 0 0 0 0 0 0 0  PHQ- 9 Score - - - - - - -  Fall Risk Fall Risk  06/11/2017 05/20/2017 03/28/2017 02/14/2017 02/01/2017  Falls in the past year? _0   Number falls  in past yr: - - - - -  Injury with Fall? - - - - -  Risk for fall due to : - - - - -  Risk for fall due to: Comment - - - - -    Is the patient's home free of loose throw rugs in walkways, pet beds, electrical cords, etc?   yes      Grab bars in the bathroom? no      Handrails on the stairs?   yes      Adequate lighting?   yes   Cognitive Function: MMSE - Mini Mental State Exam 06/11/2017 06/05/2016  Orientation to time 5 5  Orientation to Place 5 5  Registration 3 3  Attention/ Calculation 5 5  Recall 3 3  Language- name 2 objects 2 2  Language- repeat 1 1  Language- follow 3 step command 3 3  Language- read & follow direction 1 1  Write a sentence 1 1  Copy design 1 1  Total score 30 30        Screening Tests Health Maintenance  Topic Date Due  . Fecal DNA (Cologuard)  04/29/1997  . TETANUS/TDAP  07/14/2017  . INFLUENZA VACCINE  08/07/2017  . HEMOGLOBIN A1C  08/20/2017  . OPHTHALMOLOGY EXAM  09/16/2017  . FOOT EXAM  05/21/2018  . MAMMOGRAM  01/14/2019  . DEXA SCAN  Completed  . Hepatitis C Screening  Completed  . PNA vac Low Risk Adult  Completed    Qualifies for Shingles Vaccine? Yes, declined today.    Cancer Screenings: Lung: Low Dose CT Chest recommended if Age 16-80 years, 30 pack-year currently smoking OR have quit w/in 15years. Patient does qualify. Breast: Up to date on Mammogram? Yes   Up to date of Bone Density/Dexa? Yes Colorectal: planning to complete Cologuard  Additional Screenings:  Hepatitis C Screening:  Completed 03/23/15     Plan:      Work on your goal of quitting smoking.  Bring a copy of your Advance Directives to our office to be filed in your medical record. Consider getting Shingrix vaccine at next office visit.   I have personally reviewed and noted the following in the patient's chart:   . Medical and social history . Use of alcohol, tobacco or illicit drugs  . Current medications and supplements . Functional ability and  status . Nutritional status . Physical activity . Advanced directives . List of other physicians . Hospitalizations, surgeries, and ER visits in previous 12 months . Vitals . Screenings to include cognitive, depression, and falls . Referrals and appointments  In addition, I have reviewed and discussed with patient certain preventive protocols, quality metrics, and best practice recommendations. A written personalized care plan for preventive services as well as general preventive health recommendations were provided to patient.     WYATT, AMY M, RN   06/12/2017   I have reviewed and agree with the above AWV documentation.   Evelina Dun, FNP

## 2017-06-17 DIAGNOSIS — Z1212 Encounter for screening for malignant neoplasm of rectum: Secondary | ICD-10-CM | POA: Diagnosis not present

## 2017-06-17 DIAGNOSIS — Z1211 Encounter for screening for malignant neoplasm of colon: Secondary | ICD-10-CM | POA: Diagnosis not present

## 2017-06-24 LAB — COLOGUARD
COLOGUARD: POSITIVE
COLOGUARD: POSITIVE

## 2017-06-25 ENCOUNTER — Telehealth: Payer: Self-pay | Admitting: Nurse Practitioner

## 2017-06-25 DIAGNOSIS — R195 Other fecal abnormalities: Secondary | ICD-10-CM

## 2017-06-25 NOTE — Telephone Encounter (Signed)
Left message for patient to call.

## 2017-06-25 NOTE — Telephone Encounter (Signed)
Needs GI referral- where would she like to go

## 2017-06-25 NOTE — Telephone Encounter (Signed)
rtn missed call

## 2017-06-25 NOTE — Telephone Encounter (Signed)
Pt aware of positive Cologard and the need for GI appt. Pt states she doesn't care which GI she sees. Referral placed.

## 2017-06-26 ENCOUNTER — Encounter: Payer: Self-pay | Admitting: Gastroenterology

## 2017-08-20 ENCOUNTER — Ambulatory Visit (AMBULATORY_SURGERY_CENTER): Payer: Self-pay | Admitting: *Deleted

## 2017-08-20 VITALS — Ht 65.0 in | Wt 191.4 lb

## 2017-08-20 DIAGNOSIS — Z1211 Encounter for screening for malignant neoplasm of colon: Secondary | ICD-10-CM

## 2017-08-20 MED ORDER — SUPREP BOWEL PREP KIT 17.5-3.13-1.6 GM/177ML PO SOLN: 1.0000 | Freq: Once | ORAL | 0 refills | Status: AC

## 2017-08-20 NOTE — Progress Notes (Signed)
No egg or soy allergy known to patient  No issues with past sedation with any surgeries  or procedures, no intubation problems  No diet pills per patient No home 02 use per patient  No blood thinners per patient  Pt denies issues with constipation  No A fib or A flutter  EMMI video sent to pt's e mail  

## 2017-09-01 ENCOUNTER — Other Ambulatory Visit: Payer: Self-pay | Admitting: Nurse Practitioner

## 2017-09-01 ENCOUNTER — Ambulatory Visit (AMBULATORY_SURGERY_CENTER): Payer: Medicare Other | Admitting: Gastroenterology

## 2017-09-01 ENCOUNTER — Encounter: Payer: Self-pay | Admitting: Gastroenterology

## 2017-09-01 VITALS — BP 124/56 | HR 80 | Temp 99.3°F | Resp 19 | Ht 64.0 in | Wt 190.0 lb

## 2017-09-01 DIAGNOSIS — Z1211 Encounter for screening for malignant neoplasm of colon: Secondary | ICD-10-CM | POA: Diagnosis not present

## 2017-09-01 DIAGNOSIS — D128 Benign neoplasm of rectum: Secondary | ICD-10-CM

## 2017-09-01 DIAGNOSIS — D122 Benign neoplasm of ascending colon: Secondary | ICD-10-CM | POA: Diagnosis not present

## 2017-09-01 DIAGNOSIS — D124 Benign neoplasm of descending colon: Secondary | ICD-10-CM | POA: Diagnosis not present

## 2017-09-01 DIAGNOSIS — D120 Benign neoplasm of cecum: Secondary | ICD-10-CM

## 2017-09-01 DIAGNOSIS — K529 Noninfective gastroenteritis and colitis, unspecified: Secondary | ICD-10-CM | POA: Diagnosis not present

## 2017-09-01 DIAGNOSIS — R195 Other fecal abnormalities: Secondary | ICD-10-CM

## 2017-09-01 DIAGNOSIS — D123 Benign neoplasm of transverse colon: Secondary | ICD-10-CM | POA: Diagnosis not present

## 2017-09-01 MED ORDER — SODIUM CHLORIDE 0.9 % IV SOLN: 500.0000 mL | Freq: Once | INTRAVENOUS | Status: DC

## 2017-09-01 NOTE — Patient Instructions (Signed)
//  Thank you for allowing [place] to care for you today!//  Pathology results by mail in 2 weeks.  Next colonoscopy to be determined after pathology results.  Handout for polyps given.   No Aspirin, Naproxen, Ibuprofen, or other NSAIDS (anti-steroidal anti-inflammatory)  for 5 days.  May resume Saturday 09/07/2017    YOU HAD AN ENDOSCOPIC PROCEDURE TODAY AT [facility]:   Refer to the procedure report that was given to you for any specific questions about what was found during the examination.  If the procedure report does not answer your questions, plase call your gastroenterologist to clarify.  If you requested that your care partner not be given the details of your procedure findings, then the procedure report has been included in a sealed envelope for you to review at your convenience later.  YOU SHOULD EXPECT: Some feelings of bloating in the abdomen. Passage of more gas than usual.  Walking can help get rid of the air that was put into your GI tract during the procedure and reduce the bloating. If you had a lower endoscopy (such as a colonoscopy or flexible sigmoidoscopy) you may notice spotting of blood in your stool or on the toilet paper. If you underwent a bowel prep for your procedure, you may not have a normal bowel movement for a few days.  Please Note:  You might notice some irritation and congestion in your nose or some drainage.  This is from the oxygen used during your procedure.  There is no need for concern and it should clear up in a day or so.  SYMPTOMS TO REPORT IMMEDIATELY:   Following lower endoscopy (colonoscopy or flexible sigmoidoscopy):  Excessive amounts of blood in the stool  Significant tenderness or worsening of abdominal pains  Swelling of the abdomen that is new, acute  Fever of 100F or higher   For urgent or emergent issues, a gastroenterologist can be reached at any hour by calling [phone].   DIET:  We do recommend a small meal at  first, but then you may proceed to your regular diet.  Drink plenty of fluids but you should avoid alcoholic beverages for 24 hours.  ACTIVITY:  You should plan to take it easy for the rest of today and you should NOT DRIVE or use heavy machinery until tomorrow (because of the sedation medicines used during the test).    FOLLOW UP: Our staff will call the number listed on your records the next business day following your procedure to check on you and address any questions or concerns that you may have regarding the information given to you following your procedure. If we do not reach you, we will leave a message.  However, if you are feeling well and you are not experiencing any problems, there is no need to return our call.  We will assume that you have returned to your regular daily activities without incident.  If any biopsies were taken you will be contacted by phone or by letter within the next 1-3 weeks.  Please call us at [phone] if you have not heard about the biopsies in 3 weeks.    SIGNATURES/CONFIDENTIALITY: You and/or your care partner have signed paperwork which will be entered into your electronic medical record.  These signatures attest to the fact that that the information above on your After Visit Summary has been reviewed and is understood.  Full responsibility of the confidentiality of this discharge information lies with you and/or your care-partner.

## 2017-09-01 NOTE — Progress Notes (Signed)
Called to room to assist during endoscopic procedure.  Patient ID and intended procedure confirmed with present staff. Received instructions for my participation in the procedure from the performing physician.  

## 2017-09-01 NOTE — Op Note (Signed)
[facility] Patient Name: Rita Payne Procedure Date: 09/01/2017 11:19 AM MRN: [id number] Endoscopist: Mallie Mussel L. Loletha Carrow , MD Age: 72 Referring MD:  Date of Birth: [birth year] Gender: Female Account #: [id number] Procedure:                Colonoscopy Indications:              Positive Cologuard test Medicines:                Monitored Anesthesia Care Procedure:                Pre-Anesthesia Assessment:                           - Prior to the procedure, a History and Physical                            was performed, and patient medications and                            allergies were reviewed. The patient's tolerance of                            previous anesthesia was also reviewed. The risks                            and benefits of the procedure and the sedation                            options and risks were discussed with the patient.                            All questions were answered, and informed consent                            was obtained. Anticoagulants: The patient has taken                            aspirin. It was decided not to withhold this                            medication prior to the procedure. ASA Grade                            Assessment: III - A patient with severe systemic                            disease. After reviewing the risks and benefits,                            the patient was deemed in satisfactory condition to                            undergo the procedure.  After obtaining informed consent, the colonoscope                            was passed under direct vision. Throughout the                            procedure, the patient's blood pressure, pulse, and                            oxygen saturations were monitored continuously. The                            Model [id number]-[id number] scope was introduced                            through the anus and advanced to the the cecum,                   identified by appendiceal orifice and ileocecal                            valve. The colonoscopy was somewhat difficult due                            to patient's restless leg and redundant left                            colon.The patient tolerated the procedure. The                            quality of the bowel preparation was good. The                            quality of the bowel preparation was evaluated                            using the BBPS [facility] Bowel Preparation Scale)                            with scores of: Right Colon = 2, Transverse Colon =                            2 and Left Colon = 2. The total BBPS score equals 6. Scope In: 11:32:27 AM Scope Out: 12:30:35 PM Scope Withdrawal Time: 0 hours 53 minutes 30 seconds  Total Procedure Duration: 0 hours 58 minutes 8 seconds  Findings:                 The digital rectal exam findings include decreased                            sphincter tone.                           Normal mucosa was found in the entire colon.  Biopsies for histology were taken with a cold                            forceps from the right colon and left colon for                            evaluation of microscopic colitis due to patient's                            report of chronic diarrhea. (Jar 2)                           A 12 mm polyp was found in the cecum. The polyp was                            flat and multi-lobulated. The polyp was removed                            with a piecemeal technique using a hot and cold                            snare. Resection and retrieval were complete. (Jar                            1)                           A 4 mm polyp was found in the ascending colon. The                            polyp was sessile. The polyp was removed with a                            cold snare. Resection and retrieval were complete.                            (Jar 3)                            A 10 mm polyp was found in the ascending colon. The                            polyp was sessile. The polyp was removed with a hot                            snare. Resection and retrieval were complete. (Jar                            3)                           A 5 mm polyp was found in the transverse colon. The  polyp was sessile. The polyp was removed with a                            cold snare. Resection and retrieval were complete.                            (Jar 3)                           Four sessile polyps were found in the descending                            colon. The polyps were 4 to 6 mm in size. These                            polyps were removed with a cold snare. Resection                            and retrieval were complete.                           Two sessile polyps were found in the descending                            colon. The polyps were 4 to 5 mm in size. These                            polyps were removed with a cold snare. Resection                            and retrieval were complete.                           A 6 mm polyp was found in the rectum. The polyp was                            semi-pedunculated. The polyp was removed with a hot                            snare. Resection and retrieval were complete.                           The sigmoid colon was redundant.                           The exam was otherwise without abnormality on                            direct and retroflexion views. Complications:            No immediate complications. Estimated Blood Loss:     Estimated blood loss was minimal. Estimated blood  loss was minimal. Impression:               - Decreased sphincter tone found on digital rectal                            exam.                           - Normal mucosa in the entire examined colon.                            Biopsied.                           -  One 12 mm polyp in the cecum, removed piecemeal                            using a hot snare. Resected and retrieved.                           - One 4 mm polyp in the ascending colon, removed                            with a cold snare. Resected and retrieved.                           - One 10 mm polyp in the ascending colon, removed                            with a hot snare. Resected and retrieved.                           - One 5 mm polyp in the transverse colon, removed                            with a cold snare. Resected and retrieved.                           - Four 4 to 6 mm polyps in the descending colon,                            removed with a cold snare. Resected and retrieved.                           - Two 4 to 5 mm polyps in the descending colon,                            removed with a cold snare. Resected and retrieved.                           - One 6 mm polyp in the rectum, removed with a hot  snare. Resected and retrieved.                           - Redundant colon.                           - The examination was otherwise normal on direct                            and retroflexion views. Recommendation:           - Patient has a contact number available for                            emergencies. The signs and symptoms of potential                            delayed complications were discussed with the                            patient. Return to normal activities tomorrow.                            Written discharge instructions were provided to the                            patient.                           - Resume previous diet.                           - No aspirin, ibuprofen, naproxen, or other                            non-steroidal anti-inflammatory drugs for 5 days                            after polyp removal.                           - Await pathology results.                           - Repeat colonoscopy is  recommended for                            surveillance. The colonoscopy date will be                            determined after pathology results from today's                            exam become available for review. Raynor Calcaterra L. Loletha Carrow, MD 09/01/2017 12:41:50 PM This report has been signed electronically.

## 2017-09-01 NOTE — Progress Notes (Signed)
A and O x3. Report to RN. Tolerated MAC anesthesia well.

## 2017-09-02 ENCOUNTER — Ambulatory Visit: Payer: Medicare Other | Admitting: Nurse Practitioner

## 2017-09-02 ENCOUNTER — Telehealth: Payer: Self-pay | Admitting: *Deleted

## 2017-09-02 NOTE — Telephone Encounter (Signed)
  Follow up Call-  Call back number 09/01/2017  Post procedure Call Back phone  # [phone]  Permission to leave phone message Yes  Some recent data might be hidden     Patient questions:  Do you have a fever, pain , or abdominal swelling? No. Pain Score  0 *  Have you tolerated food without any problems? Yes.    Have you been able to return to your normal activities? Yes.    Do you have any questions about your discharge instructions: Diet   No. Medications  No. Follow up visit  No.  Do you have questions or concerns about your Care? No.  Actions: * If pain score is 4 or above: No action needed, pain <4.

## 2017-09-03 ENCOUNTER — Encounter: Payer: Self-pay | Admitting: Family Medicine

## 2017-09-03 ENCOUNTER — Ambulatory Visit (INDEPENDENT_AMBULATORY_CARE_PROVIDER_SITE_OTHER): Payer: Medicare Other | Admitting: Family Medicine

## 2017-09-03 VITALS — BP 141/83 | HR 98 | Temp 98.0°F | Ht 64.0 in | Wt 191.2 lb

## 2017-09-03 DIAGNOSIS — N3000 Acute cystitis without hematuria: Secondary | ICD-10-CM | POA: Diagnosis not present

## 2017-09-03 DIAGNOSIS — R300 Dysuria: Secondary | ICD-10-CM | POA: Diagnosis not present

## 2017-09-03 LAB — URINALYSIS, COMPLETE
Bilirubin, UA: NEGATIVE
GLUCOSE, UA: NEGATIVE
Ketones, UA: NEGATIVE
Nitrite, UA: NEGATIVE
PROTEIN UA: NEGATIVE
Specific Gravity, UA: 1.02 (ref 1.005–1.030)
UUROB: 1 mg/dL (ref 0.2–1.0)
pH, UA: 6 (ref 5.0–7.5)

## 2017-09-03 LAB — MICROSCOPIC EXAMINATION: WBC, UA: >30 /hpf — AB (ref 0–5)

## 2017-09-03 MED ORDER — UROGESIC-BLUE 81.6 MG PO TABS: 1.0000 | ORAL_TABLET | Freq: Four times a day (QID) | ORAL | 1 refills | Status: DC | PRN

## 2017-09-03 MED ORDER — SULFAMETHOXAZOLE-TRIMETHOPRIM 800-160 MG PO TABS: 1.0000 | ORAL_TABLET | Freq: Two times a day (BID) | ORAL | 0 refills | Status: DC

## 2017-09-03 NOTE — Progress Notes (Signed)
BP (!) 141/83   Pulse 98   Temp 98 F (36.7 C) (Oral)   Ht 5\' 4"  (1.626 m)   Wt 191 lb 3.2 oz (86.7 kg)   LMP 04/14/1982   BMI 32.82 kg/m    Subjective:    Patient ID: Rita Payne, female    DOB: 1945/03/28, 72 y.o.   MRN: [id number]  HPI: Rita Payne is a 72 y.o. female presenting on 09/03/2017 for Dysuria (x 1 day)   HPI Dysuria and frequency Patient comes in with complaints of dysuria and frequency that is been going on for the past 2 days.  She says she has had UTIs frequently in the past and says is definitely feels like that again.  She has burning and irritation.  She denies any blood in her urine.  She says this does not feel as bad as some previous ones it felt for.  She did just have a colonoscopy a couple days ago and had to go through the prep and aspirin the symptoms started.  Relevant past medical, surgical, family and social history reviewed and updated as indicated. Interim medical history since our last visit reviewed. Allergies and medications reviewed and updated.  Review of Systems  Constitutional: Negative for chills and fever.  Respiratory: Negative for chest tightness and shortness of breath.   Cardiovascular: Negative for chest pain and leg swelling.  Gastrointestinal: Negative for abdominal pain.  Genitourinary: Positive for dysuria, frequency and urgency. Negative for decreased urine volume, difficulty urinating, flank pain, hematuria, vaginal bleeding, vaginal discharge and vaginal pain.  Musculoskeletal: Negative for back pain and gait problem.  Skin: Negative for rash.  Neurological: Negative for light-headedness and headaches.  Psychiatric/Behavioral: Negative for agitation and behavioral problems.  All other systems reviewed and are negative.   Per HPI unless specifically indicated above   Allergies as of 09/03/2017      Reactions   Ivp Dye [iodinated Diagnostic Agents] Anaphylaxis   Shellfish Allergy Anaphylaxis   Vasotec [enalapril]  Shortness Of Breath   Adhesive [tape] Other (See Comments)   blisters   Iodine Other (See Comments)   Iodine that is applied to skin causes blisters    Pyridium [phenazopyridine Hcl]    blisters      Medication List        Accurate as of 09/03/17  4:28 PM. Always use your most recent med list.          aspirin 81 MG tablet Take 1 tablet (81 mg total) by mouth daily. Resume 4 days post-op   celecoxib 200 MG capsule Commonly known as:  CELEBREX TAKE 1 CAPSULE EVERY 12 HOURS   cetirizine 10 MG tablet Commonly known as:  ZYRTEC TAKE 1 TABLET DAILY   cholecalciferol 1000 units tablet Commonly known as:  VITAMIN D Take 1,000 Units by mouth daily.   CRANBERRY PO Take 8,600 Units by mouth daily.   diclofenac sodium 1 % Gel Commonly known as:  VOLTAREN Apply topically 4 (four) times daily as needed.   fenofibrate 145 MG tablet Commonly known as:  TRICOR Take 1 tablet (145 mg total) by mouth daily.   Fluticasone-Salmeterol 250-50 MCG/DOSE Aepb Commonly known as:  ADVAIR USE 1 INHALATION EVERY 12 HOURS   glimepiride 2 MG tablet Commonly known as:  AMARYL 1 po bid   glucose blood test strip Test qd. DX: E11.9   metFORMIN 500 MG tablet Commonly known as:  GLUCOPHAGE Take 1 tablet (500 mg total) by mouth 2 (two) times daily  with a meal.   pantoprazole 40 MG tablet Commonly known as:  PROTONIX Take 1 tablet (40 mg total) by mouth daily.   PROAIR HFA 108 (90 Base) MCG/ACT inhaler Generic drug:  albuterol USE 2 INHALATIONS EVERY 6 HOURS AS NEEDED FOR WHEEZING   rosuvastatin 40 MG tablet Commonly known as:  CRESTOR Take 1 tablet (40 mg total) by mouth at bedtime.   sulfamethoxazole-trimethoprim 800-160 MG tablet Commonly known as:  BACTRIM DS,SEPTRA DS Take 1 tablet by mouth 2 (two) times daily.   telmisartan-hydrochlorothiazide 80-25 MG tablet Commonly known as:  MICARDIS HCT Take 1 tablet by mouth every morning.   UROGESIC-BLUE 81.6 MG Tabs Take 1 tablet  (81.6 mg total) by mouth 4 (four) times daily as needed.   VAGIFEM 10 MCG Tabs vaginal tablet Generic drug:  Estradiol Place 10 mcg vaginally 2 (two) times a week.          Objective:    BP (!) 141/83   Pulse 98   Temp 98 F (36.7 C) (Oral)   Ht 5\' 4"  (1.626 m)   Wt 191 lb 3.2 oz (86.7 kg)   LMP 04/14/1982   BMI 32.82 kg/m   Wt Readings from Last 3 Encounters:  09/03/17 191 lb 3.2 oz (86.7 kg)  09/01/17 190 lb (86.2 kg)  08/20/17 191 lb 6.4 oz (86.8 kg)    Physical Exam  Constitutional: She is oriented to person, place, and time. She appears well-developed and well-nourished. No distress.  Eyes: Conjunctivae are normal.  Cardiovascular: Normal rate, regular rhythm, normal heart sounds and intact distal pulses.  No murmur heard. Pulmonary/Chest: Effort normal and breath sounds normal. No respiratory distress. She has no wheezes.  Abdominal: Soft. Bowel sounds are normal. She exhibits no distension. There is no tenderness. There is no guarding.  Musculoskeletal: Normal range of motion. She exhibits no edema or tenderness.  Neurological: She is alert and oriented to person, place, and time. Coordination normal.  Skin: Skin is warm and dry. No rash noted. She is not diaphoretic.  Psychiatric: She has a normal mood and affect. Her behavior is normal.  Nursing note and vitals reviewed.   Urinalysis: Greater than 30 to RBCs, 0 to RBCs, 0-10 epithelial cells, many bacteria, 2+ leukocytes, trace blood    Assessment & Plan:   Problem List Items Addressed This Visit    None    Visit Diagnoses    Acute cystitis without hematuria    -  Primary   Relevant Medications   sulfamethoxazole-trimethoprim (BACTRIM DS,SEPTRA DS) 800-160 MG tablet   Methen-Hyosc-Meth Blue-Na Phos (UROGESIC-BLUE) 81.6 MG TABS   Other Relevant Orders   Urinalysis, Complete   Urine Culture       Follow up plan: Return if symptoms worsen or fail to improve.  Counseling provided for all of the  vaccine components Orders Placed This Encounter  Procedures  . Urine Culture  . Urinalysis, Complete    Caryl Pina, MD [facility] 09/03/2017, 4:28 PM

## 2017-09-05 ENCOUNTER — Ambulatory Visit: Payer: Medicare Other | Admitting: Nurse Practitioner

## 2017-09-05 LAB — URINE CULTURE

## 2017-09-09 ENCOUNTER — Encounter: Payer: Self-pay | Admitting: Gastroenterology

## 2017-09-25 ENCOUNTER — Ambulatory Visit: Payer: Medicare Other | Admitting: Nurse Practitioner

## 2017-10-10 ENCOUNTER — Encounter: Payer: Self-pay | Admitting: Nurse Practitioner

## 2017-10-10 ENCOUNTER — Ambulatory Visit (INDEPENDENT_AMBULATORY_CARE_PROVIDER_SITE_OTHER): Payer: Medicare Other | Admitting: Nurse Practitioner

## 2017-10-10 VITALS — BP 118/73 | HR 100 | Temp 97.0°F | Ht 64.0 in | Wt 190.0 lb

## 2017-10-10 DIAGNOSIS — I10 Essential (primary) hypertension: Secondary | ICD-10-CM | POA: Diagnosis not present

## 2017-10-10 DIAGNOSIS — M810 Age-related osteoporosis without current pathological fracture: Secondary | ICD-10-CM

## 2017-10-10 DIAGNOSIS — K219 Gastro-esophageal reflux disease without esophagitis: Secondary | ICD-10-CM | POA: Diagnosis not present

## 2017-10-10 DIAGNOSIS — E119 Type 2 diabetes mellitus without complications: Secondary | ICD-10-CM | POA: Diagnosis not present

## 2017-10-10 DIAGNOSIS — E785 Hyperlipidemia, unspecified: Secondary | ICD-10-CM

## 2017-10-10 DIAGNOSIS — J4520 Mild intermittent asthma, uncomplicated: Secondary | ICD-10-CM | POA: Diagnosis not present

## 2017-10-10 DIAGNOSIS — Z23 Encounter for immunization: Secondary | ICD-10-CM | POA: Diagnosis not present

## 2017-10-10 DIAGNOSIS — Z87440 Personal history of urinary (tract) infections: Secondary | ICD-10-CM

## 2017-10-10 DIAGNOSIS — F17200 Nicotine dependence, unspecified, uncomplicated: Secondary | ICD-10-CM

## 2017-10-10 DIAGNOSIS — E559 Vitamin D deficiency, unspecified: Secondary | ICD-10-CM

## 2017-10-10 DIAGNOSIS — Z6833 Body mass index (BMI) 33.0-33.9, adult: Secondary | ICD-10-CM | POA: Diagnosis not present

## 2017-10-10 LAB — URINALYSIS, COMPLETE
BILIRUBIN UA: NEGATIVE
Glucose, UA: NEGATIVE
Ketones, UA: NEGATIVE
Leukocytes, UA: NEGATIVE
NITRITE UA: NEGATIVE
PH UA: 6 (ref 5.0–7.5)
PROTEIN UA: NEGATIVE
Specific Gravity, UA: 1.02 (ref 1.005–1.030)
UUROB: 0.2 mg/dL (ref 0.2–1.0)

## 2017-10-10 LAB — MICROSCOPIC EXAMINATION
Epithelial Cells (non renal): >10 /hpf — AB (ref 0–10)
Renal Epithel, UA: NONE SEEN /hpf

## 2017-10-10 LAB — BAYER DCA HB A1C WAIVED: HB A1C (BAYER DCA - WAIVED): 6.6 % (ref <7.0)

## 2017-10-10 MED ORDER — FENOFIBRATE 145 MG PO TABS: 145.0000 mg | ORAL_TABLET | Freq: Every day | ORAL | 1 refills | Status: DC

## 2017-10-10 MED ORDER — ROSUVASTATIN CALCIUM 40 MG PO TABS: 40.0000 mg | ORAL_TABLET | ORAL | 1 refills | Status: DC

## 2017-10-10 MED ORDER — GLIMEPIRIDE 2 MG PO TABS: ORAL_TABLET | ORAL | 1 refills | Status: DC

## 2017-10-10 MED ORDER — PANTOPRAZOLE SODIUM 40 MG PO TBEC: 40.0000 mg | DELAYED_RELEASE_TABLET | Freq: Every day | ORAL | 1 refills | Status: DC

## 2017-10-10 MED ORDER — TELMISARTAN-HCTZ 80-25 MG PO TABS: 1.0000 | ORAL_TABLET | Freq: Every morning | ORAL | 1 refills | Status: DC

## 2017-10-10 MED ORDER — METFORMIN HCL 500 MG PO TABS: 500.0000 mg | ORAL_TABLET | Freq: Two times a day (BID) | ORAL | 1 refills | Status: DC

## 2017-10-10 MED ORDER — FLUTICASONE-SALMETEROL 250-50 MCG/DOSE IN AEPB: INHALATION_SPRAY | RESPIRATORY_TRACT | 1 refills | Status: DC

## 2017-10-10 NOTE — Patient Instructions (Signed)

## 2017-10-10 NOTE — Progress Notes (Signed)
Subjective:    Patient ID: Rita Payne, female    DOB: [birth year], 72 y.o.   MRN: [id number]   Chief Complaint: Medical Management of Chronic Issues   HPI:  1. Type 2 diabetes mellitus without complication, without long-term current use of insulin ([place]) last hgba1c was 6.4%. Her blood sugars are running below 130 consistently. No hypoglycemia.  2. Essential hypertension  No chest pain, sob or headache. Does not check blood pressure at home. BP Readings from Last 3 Encounters:  10/10/17 118/73  09/03/17 (!) 141/83  09/01/17 (!) 124/56     3. Hyperlipidemia with target LDL less than 100  Watches diet but does very little exercise.  4. Recent urinary tract infection  Not symptomatic- denies freq, urgency and dysuria  5. Gastroesophageal reflux disease without esophagitis  takes protonix daily- works well to keep symptoms under control  6. Senile osteoporosis  Last dexascan ws done on 08/13/16 with t score of -0.9. She takes vitamin d daily  7. Vitamin D deficiency  Takes supplement daily  8. Smoker  Smokes 6 cigarettes a day. Sh ewould like to quit but is not ready sh esays  9. BMI 33.0-33.9,adult  Weight is unchanged    Outpatient Encounter Medications as of 10/10/2017  Medication Sig  . aspirin 81 MG tablet Take 1 tablet (81 mg total) by mouth daily. Resume 4 days post-op  . celecoxib (CELEBREX) 200 MG capsule TAKE 1 CAPSULE EVERY 12 HOURS  . cetirizine (ZYRTEC) 10 MG tablet TAKE 1 TABLET DAILY  . cholecalciferol (VITAMIN D) 1000 UNITS tablet Take 1,000 Units by mouth daily.  Marland Kitchen CRANBERRY PO Take 8,600 Units by mouth daily.   . diclofenac sodium (VOLTAREN) 1 % GEL Apply topically 4 (four) times daily as needed.  . fenofibrate (TRICOR) 145 MG tablet Take 1 tablet (145 mg total) by mouth daily.  . Fluticasone-Salmeterol (ADVAIR DISKUS) 250-50 MCG/DOSE AEPB USE 1 INHALATION EVERY 12 HOURS  . glimepiride (AMARYL) 2 MG tablet 1 po bid  . glucose blood (FREESTYLE LITE) test strip  Test qd. DX: E11.9  . metFORMIN (GLUCOPHAGE) 500 MG tablet Take 1 tablet (500 mg total) by mouth 2 (two) times daily with a meal. (Patient taking differently: Take 500 mg by mouth. Take 1 tablet in the morning, and 2 tablets in the evenings.)  . pantoprazole (PROTONIX) 40 MG tablet Take 1 tablet (40 mg total) by mouth daily.  Marland Kitchen PROAIR HFA 108 (90 Base) MCG/ACT inhaler USE 2 INHALATIONS EVERY 6 HOURS AS NEEDED FOR WHEEZING  . rosuvastatin (CRESTOR) 40 MG tablet Take 1 tablet (40 mg total) by mouth at bedtime. (Patient taking differently: Take 40 mg by mouth every other day. )  . telmisartan-hydrochlorothiazide (MICARDIS HCT) 80-25 MG tablet Take 1 tablet by mouth every morning.  Marland Kitchen VAGIFEM 10 MCG TABS vaginal tablet Place 10 mcg vaginally 2 (two) times a week.       . Methen-Hyosc-Meth Blue-Na Phos (UROGESIC-BLUE) 81.6 MG TABS Take 1 tablet (81.6 mg total) by mouth 4 (four) times daily as needed. (Patient not taking: Reported on 10/10/2017)       New complaints: - had colonoscopy in august and had 11 polyps- 6 of which were precancerous- she is to repeat this next year. - diarrhea - worsening- gi said may be coming from metformin. Social history: Still working part time as Radio broadcast assistant     Review of Systems  Constitutional: Negative for activity change and appetite change.  HENT: Negative.   Eyes: Negative for  pain.  Respiratory: Negative for shortness of breath.   Cardiovascular: Negative for chest pain, palpitations and leg swelling.  Gastrointestinal: Negative for abdominal pain.  Endocrine: Negative for polydipsia.  Genitourinary: Negative.   Skin: Negative for rash.  Neurological: Negative for dizziness, weakness and headaches.  Hematological: Does not bruise/bleed easily.  Psychiatric/Behavioral: Negative.   All other systems reviewed and are negative.      Objective:   Physical Exam  Constitutional: She is oriented to person, place, and time. She appears well-developed and  well-nourished. No distress.  HENT:  Head: Normocephalic.  Nose: Nose normal.  Mouth/Throat: Oropharynx is clear and moist.  Eyes: Pupils are equal, round, and reactive to light. EOM are normal.  Neck: Normal range of motion. Neck supple. No JVD present. Carotid bruit is not present.  Cardiovascular: Normal rate, regular rhythm, normal heart sounds and intact distal pulses.  Pulmonary/Chest: Effort normal and breath sounds normal. No respiratory distress. She has no wheezes. She has no rales. She exhibits no tenderness.  Abdominal: Soft. Normal appearance, normal aorta and bowel sounds are normal. She exhibits no distension, no abdominal bruit, no pulsatile midline mass and no mass. There is no splenomegaly or hepatomegaly. There is no tenderness.  Musculoskeletal: Normal range of motion. She exhibits no edema.  Lymphadenopathy:    She has no cervical adenopathy.  Neurological: She is alert and oriented to person, place, and time. She has normal reflexes.  Skin: Skin is warm and dry.  Psychiatric: She has a normal mood and affect. Her behavior is normal. Judgment and thought content normal.  Nursing note and vitals reviewed.  BP 118/73   Pulse 100   Temp (!) 97 F (36.1 C) (Oral)   Ht _0  (1.626 m)   Wt 190 lb (86.2 kg)   LMP 04/14/1982   BMI 32.61 kg/m   hgba1c 6.6      Assessment & Plan:  Rita Payne comes in today with chief complaint of Medical Management of Chronic Issues   Diagnosis and orders addressed:  1. Type 2 diabetes mellitus without complication, without long-term current use of insulin ([facility]) Continue to watch carbsin diet Decrease metformin to 556m bid- was on 1 in am and 2 in evening - Bayer DCA Hb A1c Waived - Microalbumin / creatinine urine ratio - metFORMIN (GLUCOPHAGE) 500 MG tablet; Take 1 tablet (500 mg total) by mouth 2 (two) times daily with a meal.  Dispense: 180 tablet; Refill: 1 - glimepiride (AMARYL) 2 MG tablet; 1 po bid  Dispense: 180  tablet; Refill: 1  2. Essential hypertension Low sodium diet - CMP14+EGFR - telmisartan-hydrochlorothiazide (MICARDIS HCT) 80-25 MG tablet; Take 1 tablet by mouth every morning.  Dispense: 90 tablet; Refill: 1  3. Hyperlipidemia with target LDL less than 100 Low fat diet - Lipid panel - rosuvastatin (CRESTOR) 40 MG tablet; Take 1 tablet (40 mg total) by mouth every other day.  Dispense: 90 tablet; Refill: 1  4. Recent urinary tract infection Force fluids - Urinalysis, Complete  5. Gastroesophageal reflux disease without esophagitis Avoid spicy foods Do not eat 2 hours prior to bedtime - pantoprazole (PROTONIX) 40 MG tablet; Take 1 tablet (40 mg total) by mouth daily.  Dispense: 90 tablet; Refill: 1  6. Senile osteoporosis Weight bearing exercise  7. Vitamin D deficiency Continue vitamin d supplements  8. Smoker Continue to cut back smoking  9. BMI 33.0-33.9,adult Discussed diet and exercise for person with BMI >25 Will recheck weight in 3-6 months  10.  Asthma, chronic, mild intermittent, uncomplicated - Fluticasone-Salmeterol (ADVAIR DISKUS) 250-50 MCG/DOSE AEPB; USE 1 INHALATION EVERY 12 HOURS  Dispense: 180 each; Refill: 1   Labs pending Health Maintenance reviewed Diet and exercise encouraged  Follow up plan: 3 months   Mary-Margaret Hassell Done, FNP

## 2017-10-11 LAB — LIPID PANEL
CHOL/HDL RATIO: 7.5 ratio — AB (ref 0.0–4.4)
CHOLESTEROL TOTAL: 217 mg/dL — AB (ref 100–199)
HDL: 29 mg/dL — ABNORMAL LOW (ref >39)
LDL CALC: 116 mg/dL — AB (ref 0–99)
TRIGLYCERIDES: 360 mg/dL — AB (ref 0–149)
VLDL Cholesterol Cal: 72 mg/dL — ABNORMAL HIGH (ref 5–40)

## 2017-10-11 LAB — CMP14+EGFR
ALK PHOS: 68 IU/L (ref 39–117)
ALT: 34 IU/L — ABNORMAL HIGH (ref 0–32)
AST: 26 IU/L (ref 0–40)
Albumin/Globulin Ratio: 2 (ref 1.2–2.2)
Albumin: 4.4 g/dL (ref 3.5–4.8)
BILIRUBIN TOTAL: 0.3 mg/dL (ref 0.0–1.2)
BUN/Creatinine Ratio: 23 (ref 12–28)
BUN: 18 mg/dL (ref 8–27)
CHLORIDE: 97 mmol/L (ref 96–106)
CO2: 27 mmol/L (ref 20–29)
CREATININE: 0.78 mg/dL (ref 0.57–1.00)
Calcium: 9.6 mg/dL (ref 8.7–10.3)
GFR calc Af Amer: 89 mL/min/{1.73_m2} (ref >59)
GFR calc non Af Amer: 77 mL/min/{1.73_m2} (ref >59)
GLOBULIN, TOTAL: 2.2 g/dL (ref 1.5–4.5)
GLUCOSE: 119 mg/dL — AB (ref 65–99)
Potassium: 4.5 mmol/L (ref 3.5–5.2)
SODIUM: 141 mmol/L (ref 134–144)
Total Protein: 6.6 g/dL (ref 6.0–8.5)

## 2017-10-11 LAB — MICROALBUMIN / CREATININE URINE RATIO
CREATININE, UR: 131.6 mg/dL
MICROALB/CREAT RATIO: 10.7 mg/g{creat} (ref 0.0–30.0)
Microalbumin, Urine: 14.1 ug/mL

## 2017-10-17 ENCOUNTER — Other Ambulatory Visit: Payer: Self-pay | Admitting: Nurse Practitioner

## 2017-11-26 ENCOUNTER — Encounter: Payer: Self-pay | Admitting: Pediatrics

## 2017-11-26 ENCOUNTER — Ambulatory Visit (INDEPENDENT_AMBULATORY_CARE_PROVIDER_SITE_OTHER): Payer: Medicare Other | Admitting: Pediatrics

## 2017-11-26 VITALS — BP 127/71 | HR 85 | Temp 98.4°F | Ht 64.0 in | Wt 192.0 lb

## 2017-11-26 DIAGNOSIS — N3001 Acute cystitis with hematuria: Secondary | ICD-10-CM

## 2017-11-26 DIAGNOSIS — R399 Unspecified symptoms and signs involving the genitourinary system: Secondary | ICD-10-CM | POA: Diagnosis not present

## 2017-11-26 LAB — URINALYSIS, COMPLETE
Bilirubin, UA: NEGATIVE
GLUCOSE, UA: NEGATIVE
Ketones, UA: NEGATIVE
LEUKOCYTES UA: NEGATIVE
Nitrite, UA: NEGATIVE
PH UA: 5.5 (ref 5.0–7.5)
PROTEIN UA: NEGATIVE
Specific Gravity, UA: 1.01 (ref 1.005–1.030)
UUROB: 0.2 mg/dL (ref 0.2–1.0)

## 2017-11-26 LAB — MICROSCOPIC EXAMINATION
Epithelial Cells (non renal): >10 /hpf — AB (ref 0–10)
RENAL EPITHEL UA: NONE SEEN /HPF

## 2017-11-26 MED ORDER — UROGESIC-BLUE 81.6 MG PO TABS: 1.0000 | ORAL_TABLET | Freq: Four times a day (QID) | ORAL | 1 refills | Status: DC | PRN

## 2017-11-26 MED ORDER — SULFAMETHOXAZOLE-TRIMETHOPRIM 800-160 MG PO TABS: 1.0000 | ORAL_TABLET | Freq: Two times a day (BID) | ORAL | 0 refills | Status: DC

## 2017-11-26 NOTE — Progress Notes (Signed)
  Subjective:   Patient ID: Rita Payne, female    DOB: [birth year], 72 y.o.   MRN: [id number] CC: Urinary Urgency and painful urination  HPI: Rita Payne is a 72 y.o. female   Urinary frequency, dysuria starting last night, gotten worse this morning.  Taking Urogesic-Blue to help with symptoms today.  Needs refill.  Allergic to Pyridium.  No fevers.  Stomach slightly sensitive.  Relevant past medical, surgical, family and social history reviewed. Allergies and medications reviewed and updated. Social History   Tobacco Use  Smoking Status Current Every Day Smoker  . Packs/day: 0.25  . Years: 33.00  . Pack years: 8.25  . Types: Cigarettes  Smokeless Tobacco Never Used   ROS: Per HPI   Objective:    BP 127/71   Pulse 85   Temp 98.4 F (36.9 C) (Oral)   Ht 5\' 4"  (1.626 m)   Wt 192 lb (87.1 kg)   LMP 04/14/1982   BMI 32.96 kg/m   Wt Readings from Last 3 Encounters:  11/26/17 192 lb (87.1 kg)  10/10/17 190 lb (86.2 kg)  09/03/17 191 lb 3.2 oz (86.7 kg)    Gen: NAD, alert, cooperative with exam, NCAT EYES: EOMI, no conjunctival injection, or no icterus CV: NRRR, normal S1/S2, no murmur, distal pulses 2+ b/l Resp: CTABL, no wheezes, normal WOB Abd: +BS, soft, mildly tender lower abdomen NTND. no guarding or organomegaly Ext: No edema, warm Neuro: Alert and oriented MSK: normal muscle bulk  Assessment & Plan:  Rita Payne was seen today for urinary urgency and painful urination.  Diagnoses and all orders for this visit:  Acute cystitis with hematuria New problem.  Treat with below, follow-up urine culture -     Methen-Hyosc-Meth Blue-Na Phos (UROGESIC-BLUE) 81.6 MG TABS; Take 1 tablet (81.6 mg total) by mouth 4 (four) times daily as needed. -     sulfamethoxazole-trimethoprim (BACTRIM DS) 800-160 MG tablet; Take 1 tablet by mouth 2 (two) times daily.  UTI symptoms -     Urinalysis, Complete -     Urine Culture; Future -     Urine Culture   Follow up plan: Return if  symptoms worsen or fail to improve. Assunta Found, MD [place]

## 2017-11-28 ENCOUNTER — Other Ambulatory Visit: Payer: Self-pay | Admitting: Pediatrics

## 2017-11-28 LAB — URINE CULTURE

## 2017-11-28 MED ORDER — CEPHALEXIN 500 MG PO CAPS: 500.0000 mg | ORAL_CAPSULE | Freq: Two times a day (BID) | ORAL | 0 refills | Status: DC

## 2017-12-01 ENCOUNTER — Other Ambulatory Visit: Payer: Self-pay | Admitting: Nurse Practitioner

## 2017-12-08 DIAGNOSIS — Z85828 Personal history of other malignant neoplasm of skin: Secondary | ICD-10-CM | POA: Diagnosis not present

## 2017-12-08 DIAGNOSIS — L920 Granuloma annulare: Secondary | ICD-10-CM | POA: Diagnosis not present

## 2017-12-08 DIAGNOSIS — L570 Actinic keratosis: Secondary | ICD-10-CM | POA: Diagnosis not present

## 2017-12-17 ENCOUNTER — Telehealth: Payer: Self-pay | Admitting: Nurse Practitioner

## 2017-12-17 NOTE — Telephone Encounter (Signed)
Pt states you have been filling her Vagifem vag tablets but I can't find it in the med list or history where we had filled it. Ok to fill?

## 2017-12-17 NOTE — Telephone Encounter (Signed)
What is the name of the medication? Estradiol(do not see on med list) [facility]  Have you contacted your pharmacy to request a refill? [facility]  Which [facility] would you like this sent to[facility]   Patient notified that their request is being sent to the clinical staff for review and that they should receive a call once it is complete. If they do not receive a call within 24 hours they can check with their pharmacy or our office.

## 2017-12-18 MED ORDER — VAGIFEM 10 MCG VA TABS: 10.0000 ug | ORAL_TABLET | VAGINAL | 11 refills | Status: DC

## 2017-12-18 MED ORDER — VAGIFEM 10 MCG VA TABS: 10.0000 ug | ORAL_TABLET | VAGINAL | 1 refills | Status: DC

## 2017-12-29 DIAGNOSIS — H2513 Age-related nuclear cataract, bilateral: Secondary | ICD-10-CM | POA: Diagnosis not present

## 2017-12-29 DIAGNOSIS — E119 Type 2 diabetes mellitus without complications: Secondary | ICD-10-CM | POA: Diagnosis not present

## 2017-12-29 DIAGNOSIS — H04123 Dry eye syndrome of bilateral lacrimal glands: Secondary | ICD-10-CM | POA: Diagnosis not present

## 2017-12-29 DIAGNOSIS — H43813 Vitreous degeneration, bilateral: Secondary | ICD-10-CM | POA: Diagnosis not present

## 2017-12-29 LAB — HM DIABETES EYE EXAM

## 2018-01-23 ENCOUNTER — Encounter: Payer: Self-pay | Admitting: Nurse Practitioner

## 2018-01-23 ENCOUNTER — Ambulatory Visit (INDEPENDENT_AMBULATORY_CARE_PROVIDER_SITE_OTHER): Payer: Medicare Other | Admitting: Nurse Practitioner

## 2018-01-23 VITALS — BP 142/78 | HR 83 | Temp 96.8°F | Ht 64.0 in | Wt 191.0 lb

## 2018-01-23 DIAGNOSIS — I10 Essential (primary) hypertension: Secondary | ICD-10-CM | POA: Diagnosis not present

## 2018-01-23 DIAGNOSIS — E559 Vitamin D deficiency, unspecified: Secondary | ICD-10-CM

## 2018-01-23 DIAGNOSIS — Z6833 Body mass index (BMI) 33.0-33.9, adult: Secondary | ICD-10-CM | POA: Diagnosis not present

## 2018-01-23 DIAGNOSIS — J4520 Mild intermittent asthma, uncomplicated: Secondary | ICD-10-CM

## 2018-01-23 DIAGNOSIS — M810 Age-related osteoporosis without current pathological fracture: Secondary | ICD-10-CM

## 2018-01-23 DIAGNOSIS — E119 Type 2 diabetes mellitus without complications: Secondary | ICD-10-CM

## 2018-01-23 DIAGNOSIS — F17200 Nicotine dependence, unspecified, uncomplicated: Secondary | ICD-10-CM

## 2018-01-23 DIAGNOSIS — E785 Hyperlipidemia, unspecified: Secondary | ICD-10-CM | POA: Diagnosis not present

## 2018-01-23 DIAGNOSIS — K219 Gastro-esophageal reflux disease without esophagitis: Secondary | ICD-10-CM | POA: Diagnosis not present

## 2018-01-23 LAB — CMP14+EGFR
A/G RATIO: 1.9 (ref 1.2–2.2)
ALK PHOS: 66 IU/L (ref 39–117)
ALT: 48 IU/L — ABNORMAL HIGH (ref 0–32)
AST: 39 IU/L (ref 0–40)
Albumin: 4.4 g/dL (ref 3.5–4.8)
BILIRUBIN TOTAL: 0.5 mg/dL (ref 0.0–1.2)
BUN/Creatinine Ratio: 22 (ref 12–28)
BUN: 16 mg/dL (ref 8–27)
CALCIUM: 9.8 mg/dL (ref 8.7–10.3)
CHLORIDE: 97 mmol/L (ref 96–106)
CO2: 24 mmol/L (ref 20–29)
Creatinine, Ser: 0.73 mg/dL (ref 0.57–1.00)
GFR calc Af Amer: 96 mL/min/{1.73_m2} (ref >59)
GFR calc non Af Amer: 84 mL/min/{1.73_m2} (ref >59)
Globulin, Total: 2.3 g/dL (ref 1.5–4.5)
Glucose: 130 mg/dL — ABNORMAL HIGH (ref 65–99)
POTASSIUM: 4.5 mmol/L (ref 3.5–5.2)
Sodium: 139 mmol/L (ref 134–144)
Total Protein: 6.7 g/dL (ref 6.0–8.5)

## 2018-01-23 LAB — LIPID PANEL
CHOLESTEROL TOTAL: 213 mg/dL — AB (ref 100–199)
Chol/HDL Ratio: 6.5 ratio — ABNORMAL HIGH (ref 0.0–4.4)
HDL: 33 mg/dL — AB (ref >39)
LDL Calculated: 110 mg/dL — ABNORMAL HIGH (ref 0–99)
TRIGLYCERIDES: 349 mg/dL — AB (ref 0–149)
VLDL Cholesterol Cal: 70 mg/dL — ABNORMAL HIGH (ref 5–40)

## 2018-01-23 LAB — BAYER DCA HB A1C WAIVED: HB A1C: 6.6 % (ref <7.0)

## 2018-01-23 MED ORDER — PANTOPRAZOLE SODIUM 40 MG PO TBEC: 40.0000 mg | DELAYED_RELEASE_TABLET | Freq: Every day | ORAL | 1 refills | Status: DC

## 2018-01-23 MED ORDER — GLIMEPIRIDE 2 MG PO TABS: ORAL_TABLET | ORAL | 1 refills | Status: DC

## 2018-01-23 MED ORDER — FENOFIBRATE 145 MG PO TABS: 145.0000 mg | ORAL_TABLET | Freq: Every day | ORAL | 1 refills | Status: DC

## 2018-01-23 MED ORDER — FLUTICASONE-SALMETEROL 250-50 MCG/DOSE IN AEPB: INHALATION_SPRAY | RESPIRATORY_TRACT | 1 refills | Status: DC

## 2018-01-23 MED ORDER — METFORMIN HCL 500 MG PO TABS: 500.0000 mg | ORAL_TABLET | Freq: Two times a day (BID) | ORAL | 1 refills | Status: DC

## 2018-01-23 MED ORDER — TELMISARTAN-HCTZ 80-25 MG PO TABS: 1.0000 | ORAL_TABLET | Freq: Every morning | ORAL | 1 refills | Status: DC

## 2018-01-23 NOTE — Patient Instructions (Signed)
Health Maintenance After Age 71 After age 71, you are at a higher risk for certain long-term diseases and infections as well as injuries from falls. Falls are a major cause of broken bones and head injuries in people who are older than age 71. Getting regular preventive care can help to keep you healthy and well. Preventive care includes getting regular testing and making lifestyle changes as recommended by your health care provider. Talk with your health care provider about:  Which screenings and tests you should have. A screening is a test that checks for a disease when you have no symptoms.  A diet and exercise plan that is right for you. What should I know about screenings and tests to prevent falls? Screening and testing are the best ways to find a health problem early. Early diagnosis and treatment give you the best chance of managing medical conditions that are common after age 71. Certain conditions and lifestyle choices may make you more likely to have a fall. Your health care provider may recommend:  Regular vision checks. Poor vision and conditions such as cataracts can make you more likely to have a fall. If you wear glasses, make sure to get your prescription updated if your vision changes.  Medicine review. Work with your health care provider to regularly review all of the medicines you are taking, including over-the-counter medicines. Ask your health care provider about any side effects that may make you more likely to have a fall. Tell your health care provider if any medicines that you take make you feel dizzy or sleepy.  Osteoporosis screening. Osteoporosis is a condition that causes the bones to get weaker. This can make the bones weak and cause them to break more easily.  Blood pressure screening. Blood pressure changes and medicines to control blood pressure can make you feel dizzy.  Strength and balance checks. Your health care provider may recommend certain tests to check your  strength and balance while standing, walking, or changing positions.  Foot health exam. Foot pain and numbness, as well as not wearing proper footwear, can make you more likely to have a fall.  Depression screening. You may be more likely to have a fall if you have a fear of falling, feel emotionally low, or feel unable to do activities that you used to do.  Alcohol use screening. Using too much alcohol can affect your balance and may make you more likely to have a fall. What actions can I take to lower my risk of falls? General instructions  Talk with your health care provider about your risks for falling. Tell your health care provider if: ? You fall. Be sure to tell your health care provider about all falls, even ones that seem minor. ? You feel dizzy, sleepy, or off-balance.  Take over-the-counter and prescription medicines only as told by your health care provider. These include any supplements.  Eat a healthy diet and maintain a healthy weight. A healthy diet includes low-fat dairy products, low-fat (lean) meats, and fiber from whole grains, beans, and lots of fruits and vegetables. Home safety  Remove any tripping hazards, such as rugs, cords, and clutter.  Install safety equipment such as grab bars in bathrooms and safety rails on stairs.  Keep rooms and walkways well-lit. Activity   Follow a regular exercise program to stay fit. This will help you maintain your balance. Ask your health care provider what types of exercise are appropriate for you.  If you need a cane or   walker, use it as recommended by your health care provider.  Wear supportive shoes that have nonskid soles. Lifestyle  Do not drink alcohol if your health care provider tells you not to drink.  If you drink alcohol, limit how much you have: ? 0-1 drink a day for women. ? 0-2 drinks a day for men.  Be aware of how much alcohol is in your drink. In the [place] one drink equals one typical bottle of beer (12  oz), one-half glass of wine (5 oz), or one shot of hard liquor (1 oz).  Do not use any products that contain nicotine or tobacco, such as cigarettes and e-cigarettes. If you need help quitting, ask your health care provider. Summary  Having a healthy lifestyle and getting preventive care can help to protect your health and wellness after age 71.  Screening and testing are the best way to find a health problem early and help you avoid having a fall. Early diagnosis and treatment give you the best chance for managing medical conditions that are more common for people who are older than age 71.  Falls are a major cause of broken bones and head injuries in people who are older than age 71. Take precautions to prevent a fall at home.  Work with your health care provider to learn what changes you can make to improve your health and wellness and to prevent falls. This information is not intended to replace advice given to you by your health care provider. Make sure you discuss any questions you have with your health care provider. Document Released: 11/06/2016 Document Revised: 11/06/2016 Document Reviewed: 11/06/2016 Elsevier Interactive Patient Education  2019 [employer]  

## 2018-01-23 NOTE — Progress Notes (Signed)
Subjective:    Patient ID: Rita Payne, female    DOB: 1945-01-26, 73 y.o.   MRN: [id number]   Chief Complaint: medical management of chronic issues  HPI:  1. Type 2 diabetes mellitus without complication, without long-term current use of insulin ([place]) last hgba1c was 6.6%. her blood sugars have been 120-140 fasting in mornings. She had a couple of weeks where it was over 160.  2. Essential hypertension  No c/o chest pain, sob or headache. Does not check blood pressure at home. BP Readings from Last 3 Encounters:  11/26/17 127/71  10/10/17 118/73  09/03/17 (!) 141/83     3. Hyperlipidemia with target LDL less than 100  Does not watch diet and does very little exercise.  4. Gastroesophageal reflux disease without esophagitis  Takes protonix daily which works well to keep symptoms under control.  5. Senile osteoporosis  Last dexascan was done 08/13/16 with tscore of -0.9. she does no weight bearing exercises  6. Vitamin D deficiency  Takes daily supplement  7. Smoker  Smoke 5-6 cigarettes a day. She has bought some nicotine patches  8. BMI 33.0-33.9,adult  No recent weight changes    Outpatient Encounter Medications as of 01/23/2018  Medication Sig  . aspirin 81 MG tablet Take 1 tablet (81 mg total) by mouth daily. Resume 4 days post-op  . celecoxib (CELEBREX) 200 MG capsule TAKE 1 CAPSULE EVERY 12 HOURS  . cephALEXin (KEFLEX) 500 MG capsule Take 1 capsule (500 mg total) by mouth 2 (two) times daily.  . cetirizine (ZYRTEC) 10 MG tablet TAKE 1 TABLET DAILY  . cholecalciferol (VITAMIN D) 1000 UNITS tablet Take 1,000 Units by mouth daily.  Marland Kitchen CRANBERRY PO Take 8,600 Units by mouth daily.   . diclofenac sodium (VOLTAREN) 1 % GEL Apply topically 4 (four) times daily as needed.  . fenofibrate (TRICOR) 145 MG tablet Take 1 tablet (145 mg total) by mouth daily.  . Fluticasone-Salmeterol (ADVAIR DISKUS) 250-50 MCG/DOSE AEPB USE 1 INHALATION EVERY 12 HOURS  . glimepiride (AMARYL) 2 MG  tablet 1 po bid  . glucose blood (FREESTYLE LITE) test strip Test qd. DX: E11.9  . metFORMIN (GLUCOPHAGE) 500 MG tablet Take 1 tablet (500 mg total) by mouth 2 (two) times daily with a meal.  . Methen-Hyosc-Meth Blue-Na Phos (UROGESIC-BLUE) 81.6 MG TABS Take 1 tablet (81.6 mg total) by mouth 4 (four) times daily as needed.  . pantoprazole (PROTONIX) 40 MG tablet Take 1 tablet (40 mg total) by mouth daily.  Marland Kitchen PROAIR HFA 108 (90 Base) MCG/ACT inhaler USE 2 INHALATIONS EVERY 6 HOURS AS NEEDED FOR WHEEZING  . rosuvastatin (CRESTOR) 40 MG tablet Take 1 tablet (40 mg total) by mouth every other day.  . telmisartan-hydrochlorothiazide (MICARDIS HCT) 80-25 MG tablet Take 1 tablet by mouth every morning.  Marland Kitchen VAGIFEM 10 MCG TABS vaginal tablet Place 1 tablet (10 mcg total) vaginally 2 (two) times a week.       New complaints: None today  Social history: Lives by herself. Her sisters husband died a month or so ago and she stayed with her sister for 3 weeks and did not watch diet.  Review of Systems  Constitutional: Negative for activity change and appetite change.  HENT: Negative.   Eyes: Negative for pain.  Respiratory: Negative for shortness of breath.   Cardiovascular: Negative for chest pain, palpitations and leg swelling.  Gastrointestinal: Negative for abdominal pain.  Endocrine: Negative for polydipsia.  Genitourinary: Negative.   Skin: Negative for rash.  Neurological: Negative for dizziness, weakness and headaches.  Hematological: Does not bruise/bleed easily.  Psychiatric/Behavioral: Negative.   All other systems reviewed and are negative.      Objective:   Physical Exam Vitals signs and nursing note reviewed.  Constitutional:      General: She is not in acute distress.    Appearance: Normal appearance. She is well-developed.  HENT:     Head: Normocephalic.     Nose: Nose normal.  Eyes:     Pupils: Pupils are equal, round, and reactive to light.  Neck:      Musculoskeletal: Normal range of motion and neck supple.     Vascular: No carotid bruit or JVD.  Cardiovascular:     Rate and Rhythm: Normal rate and regular rhythm.     Heart sounds: Normal heart sounds.  Pulmonary:     Effort: Pulmonary effort is normal. No respiratory distress.     Breath sounds: Normal breath sounds. No wheezing or rales.  Chest:     Chest wall: No tenderness.  Abdominal:     General: Bowel sounds are normal. There is no distension or abdominal bruit.     Palpations: Abdomen is soft. There is no hepatomegaly, splenomegaly, mass or pulsatile mass.     Tenderness: There is no abdominal tenderness.  Musculoskeletal: Normal range of motion.  Lymphadenopathy:     Cervical: No cervical adenopathy.  Skin:    General: Skin is warm and dry.  Neurological:     Mental Status: She is alert and oriented to person, place, and time.     Deep Tendon Reflexes: Reflexes are normal and symmetric.  Psychiatric:        Behavior: Behavior normal.        Thought Content: Thought content normal.        Judgment: Judgment normal.     BP (!) 142/78   Pulse 83   Temp (!) 96.8 F (36 C) (Oral)   Ht '5\' 4"'$  (1.626 m)   Wt 191 lb (86.6 kg)   LMP 04/14/1982   BMI 32.79 kg/m   hgba1c 6.6%      Assessment & Plan:  Stepahnie Campo comes in today with chief complaint of Medical Management of Chronic Issues   Diagnosis and orders addressed:  1. Type 2 diabetes mellitus without complication, without long-term current use of insulin ([facility]) Continue to watch carbs in diet - Bayer DCA Hb A1c Waived - metFORMIN (GLUCOPHAGE) 500 MG tablet; Take 1 tablet (500 mg total) by mouth 2 (two) times daily with a meal.  Dispense: 180 tablet; Refill: 1 - glimepiride (AMARYL) 2 MG tablet; 1 po bid  Dispense: 180 tablet; Refill: 1  2. Essential hypertension Low sodium diet - CMP14+EGFR - telmisartan-hydrochlorothiazide (MICARDIS HCT) 80-25 MG tablet; Take 1 tablet by mouth every morning.  Dispense:  90 tablet; Refill: 1  3. Hyperlipidemia with target LDL less than 100 Low fat diet - Lipid panel  4. Gastroesophageal reflux disease without esophagitis Avoid spicy foods Do not eat 2 hours prior to bedtime - pantoprazole (PROTONIX) 40 MG tablet; Take 1 tablet (40 mg total) by mouth daily.  Dispense: 90 tablet; Refill: 1  5. Senile osteoporosis Weight bearing exercises  6. Vitamin D deficiency Continue vitamin d supplement  7. Smoker Smoking cessation encourgaed  8. BMI 33.0-33.9,adult Discussed diet and exercise for person with BMI >25 Will recheck weight in 3-6 months  9. Asthma, chronic, mild intermittent, uncomplicated - Fluticasone-Salmeterol (ADVAIR DISKUS) 250-50 MCG/DOSE AEPB; USE  1 INHALATION EVERY 12 HOURS  Dispense: 180 each; Refill: 1   Labs pending Health Maintenance reviewed Diet and exercise encouraged  Follow up plan: 3 months   Mary-Margaret Hassell Done, FNP

## 2018-02-23 ENCOUNTER — Other Ambulatory Visit: Payer: Self-pay | Admitting: Nurse Practitioner

## 2018-03-02 ENCOUNTER — Other Ambulatory Visit: Payer: Self-pay | Admitting: Nurse Practitioner

## 2018-03-03 DIAGNOSIS — Z1231 Encounter for screening mammogram for malignant neoplasm of breast: Secondary | ICD-10-CM | POA: Diagnosis not present

## 2018-03-03 LAB — HM MAMMOGRAPHY

## 2018-03-23 ENCOUNTER — Other Ambulatory Visit: Payer: Self-pay | Admitting: Nurse Practitioner

## 2018-05-08 ENCOUNTER — Ambulatory Visit (INDEPENDENT_AMBULATORY_CARE_PROVIDER_SITE_OTHER): Payer: Medicare Other | Admitting: Nurse Practitioner

## 2018-05-08 ENCOUNTER — Other Ambulatory Visit: Payer: Self-pay

## 2018-05-08 ENCOUNTER — Encounter: Payer: Self-pay | Admitting: Nurse Practitioner

## 2018-05-08 DIAGNOSIS — E559 Vitamin D deficiency, unspecified: Secondary | ICD-10-CM

## 2018-05-08 DIAGNOSIS — F17200 Nicotine dependence, unspecified, uncomplicated: Secondary | ICD-10-CM | POA: Diagnosis not present

## 2018-05-08 DIAGNOSIS — I10 Essential (primary) hypertension: Secondary | ICD-10-CM | POA: Diagnosis not present

## 2018-05-08 DIAGNOSIS — Z6833 Body mass index (BMI) 33.0-33.9, adult: Secondary | ICD-10-CM

## 2018-05-08 DIAGNOSIS — K219 Gastro-esophageal reflux disease without esophagitis: Secondary | ICD-10-CM

## 2018-05-08 DIAGNOSIS — E785 Hyperlipidemia, unspecified: Secondary | ICD-10-CM | POA: Diagnosis not present

## 2018-05-08 DIAGNOSIS — E119 Type 2 diabetes mellitus without complications: Secondary | ICD-10-CM

## 2018-05-08 MED ORDER — GLIMEPIRIDE 2 MG PO TABS: ORAL_TABLET | ORAL | 1 refills | Status: DC

## 2018-05-08 MED ORDER — FENOFIBRATE 145 MG PO TABS: 145.0000 mg | ORAL_TABLET | Freq: Every day | ORAL | 1 refills | Status: DC

## 2018-05-08 MED ORDER — TELMISARTAN-HCTZ 80-25 MG PO TABS: 1.0000 | ORAL_TABLET | Freq: Every morning | ORAL | 1 refills | Status: DC

## 2018-05-08 MED ORDER — PANTOPRAZOLE SODIUM 40 MG PO TBEC: 40.0000 mg | DELAYED_RELEASE_TABLET | Freq: Every day | ORAL | 1 refills | Status: DC

## 2018-05-08 MED ORDER — METFORMIN HCL 500 MG PO TABS: 500.0000 mg | ORAL_TABLET | Freq: Two times a day (BID) | ORAL | 1 refills | Status: DC

## 2018-05-08 MED ORDER — VAGIFEM 10 MCG VA TABS: 10.0000 ug | ORAL_TABLET | VAGINAL | 1 refills | Status: DC

## 2018-05-08 NOTE — Progress Notes (Signed)
Patient ID: Rita Payne, female   DOB: [birth year], 73 y.o.   MRN: [id number]  .  Virtual Visit via telephone Note  I connected with Rita Payne on 05/08/18 at 8:35 AM by telephone and verified that I am speaking with the correct person using two identifiers. Rita Payne is currently located at home and no one is currently with her during visit. The provider, Mary-Margaret Hassell Done, FNP is located in their office at time of visit.  I discussed the limitations, risks, security and privacy concerns of performing an evaluation and management service by telephone and the availability of in person appointments. I also discussed with the patient that there may be a patient responsible charge related to this service. The patient expressed understanding and agreed to proceed.   History and Present Illness:   Chief Complaint: medical management of chronic issues   HPI:  1. Essential hypertension *no Chest pain, sob or headache. Does not check blood pressure at home BP Readings from Last 3 Encounters:  01/23/18 (!) 142/78  11/26/17 127/71  10/10/17 118/73     2. Gastroesophageal reflux disease without esophagitis On protonix and is working well  3. Hyperlipidemia with target LDL less than 100 Avoiding fats as much as possible  4. Smoker Smoke about a pack a day  5. Vitamin D deficiency Takes daily vitamin d suplement 6. Diabetes Last HGBA1c was 6.6. blood sugars in the mornings is around 130-140. No hypoglycemia.  7. BMI 33.0-33.9 No weight changes    Outpatient Encounter Medications as of 05/08/2018  Medication Sig  . aspirin 81 MG tablet Take 1 tablet (81 mg total) by mouth daily. Resume 4 days post-op  . celecoxib (CELEBREX) 200 MG capsule TAKE 1 CAPSULE EVERY 12 HOURS  . cetirizine (ZYRTEC) 10 MG tablet TAKE 1 TABLET DAILY  . cholecalciferol (VITAMIN D) 1000 UNITS tablet Take 1,000 Units by mouth daily.  Marland Kitchen CRANBERRY PO Take 8,600 Units by mouth daily.   . diclofenac sodium  (VOLTAREN) 1 % GEL Apply topically 4 (four) times daily as needed.  . fenofibrate (TRICOR) 145 MG tablet Take 1 tablet (145 mg total) by mouth daily.  . Fluticasone-Salmeterol (ADVAIR DISKUS) 250-50 MCG/DOSE AEPB USE 1 INHALATION EVERY 12 HOURS  . glimepiride (AMARYL) 2 MG tablet 1 po bid  . glucose blood (FREESTYLE LITE) test strip Test qd. DX: E11.9  . metFORMIN (GLUCOPHAGE) 500 MG tablet Take 1 tablet (500 mg total) by mouth 2 (two) times daily with a meal.  . pantoprazole (PROTONIX) 40 MG tablet Take 1 tablet (40 mg total) by mouth daily.  Marland Kitchen PROAIR HFA 108 (90 Base) MCG/ACT inhaler USE 2 INHALATIONS EVERY 6 HOURS AS NEEDED FOR WHEEZING  . telmisartan-hydrochlorothiazide (MICARDIS HCT) 80-25 MG tablet Take 1 tablet by mouth every morning.  Marland Kitchen VAGIFEM 10 MCG TABS vaginal tablet Place 1 tablet (10 mcg total) vaginally 2 (two) times a week.       New complaints: None today  Social history:  Still working part time- daily    Review of Systems  Constitutional: Negative for diaphoresis and weight loss.  Eyes: Negative for blurred vision, double vision and pain.  Respiratory: Negative for shortness of breath.   Cardiovascular: Negative for chest pain, palpitations, orthopnea and leg swelling.  Gastrointestinal: Negative for abdominal pain.  Skin: Negative for rash.  Neurological: Negative for dizziness, sensory change, loss of consciousness, weakness and headaches.  Endo/Heme/Allergies: Negative for polydipsia. Does not bruise/bleed easily.  Psychiatric/Behavioral: Negative for memory loss. The patient does  not have insomnia.   All other systems reviewed and are negative.    Observations/Objective: Alert and oriented- answers all questions  No distress noted  Assessment and Plan: Rita Payne comes in today with chief complaint of medical management of chronic issues  Diagnosis and orders addressed:  1. Essential hypertension Low sodium diet -  telmisartan-hydrochlorothiazide (MICARDIS HCT) 80-25 MG tablet; Take 1 tablet by mouth every morning.  Dispense: 90 tablet; Refill: 1  2. Gastroesophageal reflux disease without esophagitis Avoid spicy foods Do not eat 2 hours prior to bedtime - pantoprazole (PROTONIX) 40 MG tablet; Take 1 tablet (40 mg total) by mouth daily.  Dispense: 90 tablet; Refill: 1  3. Hyperlipidemia with target LDL less than 100 Low fat diet - fenofibrate (TRICOR) 145 MG tablet; Take 1 tablet (145 mg total) by mouth daily.  Dispense: 90 tablet; Refill: 1  4. Smoker Smoking cessation encouraged  5. Vitamin D deficiency  6. Type 2 diabetes mellitus without complication, without long-term current use of insulin (HCC) contniue to watch carbsin diet - metFORMIN (GLUCOPHAGE) 500 MG tablet; Take 1 tablet (500 mg total) by mouth 2 (two) times daily with a meal.  Dispense: 180 tablet; Refill: 1 - glimepiride (AMARYL) 2 MG tablet; 1 po bid  Dispense: 180 tablet; Refill: 1  7. BMI 33.0-33.9,adult Discussed diet and exercise for person with BMI >25 Will recheck weight in 3-6 months    Labs pending Health Maintenance reviewed Diet and exercise encouraged  Follow up plan: 3 months     I discussed the assessment and treatment plan with the patient. The patient was provided an opportunity to ask questions and all were answered. The patient agreed with the plan and demonstrated an understanding of the instructions.   The patient was advised to call back or seek an in-person evaluation if the symptoms worsen or if the condition fails to improve as anticipated.  The above assessment and management plan was discussed with the patient. The patient verbalized understanding of and has agreed to the management plan. Patient is aware to call the clinic if symptoms persist or worsen. Patient is aware when to return to the clinic for a follow-up visit. Patient educated on when it is appropriate to go to the emergency  department.   Time call ended:  8:50  I provided 15 minutes of non-face-to-face time during this encounter.    Mary-Margaret Hassell Done, FNP

## 2018-05-25 ENCOUNTER — Other Ambulatory Visit: Payer: Self-pay

## 2018-05-25 ENCOUNTER — Ambulatory Visit (INDEPENDENT_AMBULATORY_CARE_PROVIDER_SITE_OTHER): Payer: Medicare Other | Admitting: Family Medicine

## 2018-05-25 ENCOUNTER — Encounter: Payer: Self-pay | Admitting: Family Medicine

## 2018-05-25 VITALS — BP 161/91 | HR 100 | Temp 97.8°F | Ht 64.0 in | Wt 193.6 lb

## 2018-05-25 DIAGNOSIS — I10 Essential (primary) hypertension: Secondary | ICD-10-CM

## 2018-05-25 MED ORDER — AMLODIPINE BESYLATE 5 MG PO TABS: 5.0000 mg | ORAL_TABLET | Freq: Every day | ORAL | 0 refills | Status: DC

## 2018-05-25 NOTE — Progress Notes (Signed)
BP (!) 161/91   Pulse 100   Temp 97.8 F (36.6 C) (Oral)   Ht 5\' 4"  (1.626 m)   Wt 193 lb 9.6 oz (87.8 kg)   LMP 04/14/1982   BMI 33.23 kg/m    Subjective:   Patient ID: Rita Payne, female    DOB: Apr 16, 1945, 73 y.o.   MRN: [id number]  HPI: Rita Payne is a 73 y.o. female presenting on 05/25/2018 for Hypertension (Patient states that he bp has been running high x 3 days. ); Blurred Vision; and balance off   HPI Hypertension Patient is currently on Micardis, and their blood pressure today is 161/91.  Patient says she has not changed anything except for the stress level with the coronavirus may be leading to some of this, she says her blood pressure has been up at home since she started checking it over the past 4 days.  It has been running as high as 191/106 and here today in the office it is 161/91.  She has continued to take her blood pressure medication every day and just feels like it is not working as well.  She does complain of a headache at times and some balance at times and some irritability and some intermittent blurred vision as well.. Denies any side effects from medication and is content with current medication.   Relevant past medical, surgical, family and social history reviewed and updated as indicated. Interim medical history since our last visit reviewed. Allergies and medications reviewed and updated.  Review of Systems  Constitutional: Negative for chills and fever.  Eyes: Positive for visual disturbance.  Respiratory: Negative for chest tightness and shortness of breath.   Cardiovascular: Negative for chest pain and leg swelling.  Skin: Negative for rash.  Neurological: Positive for dizziness and headaches. Negative for weakness, light-headedness and numbness.  All other systems reviewed and are negative.   Per HPI unless specifically indicated above   Allergies as of 05/25/2018      Reactions   Ivp Dye [iodinated Diagnostic Agents] Anaphylaxis   Shellfish Allergy Anaphylaxis   Vasotec [enalapril] Shortness Of Breath   Adhesive [tape] Other (See Comments)   blisters   Iodine Other (See Comments)   Iodine that is applied to skin causes blisters    Pyridium [phenazopyridine Hcl]    blisters      Medication List       Accurate as of May 25, 2018  9:58 AM. If you have any questions, ask your nurse or doctor.        amLODipine 5 MG tablet Commonly known as:  NORVASC Take 1 tablet (5 mg total) by mouth daily. Started by:  Worthy Rancher, MD   aspirin 81 MG tablet Take 1 tablet (81 mg total) by mouth daily. Resume 4 days post-op   celecoxib 200 MG capsule Commonly known as:  CELEBREX TAKE 1 CAPSULE EVERY 12 HOURS   cetirizine 10 MG tablet Commonly known as:  ZYRTEC TAKE 1 TABLET DAILY   cholecalciferol 1000 units tablet Commonly known as:  VITAMIN D Take 1,000 Units by mouth daily.   CRANBERRY PO Take 8,600 Units by mouth daily.   diclofenac sodium 1 % Gel Commonly known as:  VOLTAREN Apply topically 4 (four) times daily as needed.   fenofibrate 145 MG tablet Commonly known as:  Tricor Take 1 tablet (145 mg total) by mouth daily.   Fluticasone-Salmeterol 250-50 MCG/DOSE Aepb Commonly known as:  Advair Diskus USE 1 INHALATION EVERY 12 HOURS  glimepiride 2 MG tablet Commonly known as:  AMARYL 1 po bid   glucose blood test strip Commonly known as:  FREESTYLE LITE Test qd. DX: E11.9   metFORMIN 500 MG tablet Commonly known as:  GLUCOPHAGE Take 1 tablet (500 mg total) by mouth 2 (two) times daily with a meal.   pantoprazole 40 MG tablet Commonly known as:  PROTONIX Take 1 tablet (40 mg total) by mouth daily.   ProAir HFA 108 (90 Base) MCG/ACT inhaler Generic drug:  albuterol USE 2 INHALATIONS EVERY 6 HOURS AS NEEDED FOR WHEEZING   telmisartan-hydrochlorothiazide 80-25 MG tablet Commonly known as:  Micardis HCT Take 1 tablet by mouth every morning.   Vagifem 10 MCG Tabs vaginal tablet  Generic drug:  Estradiol Place 1 tablet (10 mcg total) vaginally 2 (two) times a week.        Objective:   BP (!) 161/91   Pulse 100   Temp 97.8 F (36.6 C) (Oral)   Ht 5\' 4"  (1.626 m)   Wt 193 lb 9.6 oz (87.8 kg)   LMP 04/14/1982   BMI 33.23 kg/m   Wt Readings from Last 3 Encounters:  05/25/18 193 lb 9.6 oz (87.8 kg)  01/23/18 191 lb (86.6 kg)  11/26/17 192 lb (87.1 kg)    Physical Exam Vitals signs and nursing note reviewed.  Constitutional:      General: She is not in acute distress.    Appearance: She is well-developed. She is not diaphoretic.  Eyes:     Conjunctiva/sclera: Conjunctivae normal.  Cardiovascular:     Rate and Rhythm: Normal rate and regular rhythm.     Heart sounds: Normal heart sounds. No murmur.  Pulmonary:     Effort: Pulmonary effort is normal. No respiratory distress.     Breath sounds: Normal breath sounds. No wheezing.  Skin:    General: Skin is warm and dry.     Findings: No rash.  Neurological:     Mental Status: She is alert and oriented to person, place, and time.     Cranial Nerves: No cranial nerve deficit.     Sensory: No sensory deficit.     Motor: No weakness.     Coordination: Coordination normal.     Gait: Gait normal.  Psychiatric:        Behavior: Behavior normal.       Assessment & Plan:   Problem List Items Addressed This Visit      Cardiovascular and Mediastinum   Hypertension - Primary   Relevant Medications   amLODipine (NORVASC) 5 MG tablet      Will add amlodipine 5 mg to her current blood pressure regimen and have her follow-up in 3 weeks, continue to check in follow-up in 3 weeks with PCP if possible. Follow up plan: Return in about 3 weeks (around 06/15/2018), or if symptoms worsen or fail to improve, for Blood pressure recheck with PCP.  Counseling provided for all of the vaccine components No orders of the defined types were placed in this encounter.   Caryl Pina, MD [facility] 05/25/2018, 9:58 AM

## 2018-05-31 ENCOUNTER — Other Ambulatory Visit: Payer: Self-pay | Admitting: Nurse Practitioner

## 2018-06-12 ENCOUNTER — Other Ambulatory Visit: Payer: Self-pay

## 2018-06-15 ENCOUNTER — Ambulatory Visit (INDEPENDENT_AMBULATORY_CARE_PROVIDER_SITE_OTHER): Payer: Medicare Other | Admitting: Nurse Practitioner

## 2018-06-15 ENCOUNTER — Other Ambulatory Visit: Payer: Self-pay

## 2018-06-15 ENCOUNTER — Encounter: Payer: Self-pay | Admitting: Nurse Practitioner

## 2018-06-15 VITALS — BP 137/83 | HR 90 | Temp 97.4°F | Ht 64.0 in | Wt 193.0 lb

## 2018-06-15 DIAGNOSIS — E119 Type 2 diabetes mellitus without complications: Secondary | ICD-10-CM

## 2018-06-15 DIAGNOSIS — F17200 Nicotine dependence, unspecified, uncomplicated: Secondary | ICD-10-CM | POA: Diagnosis not present

## 2018-06-15 DIAGNOSIS — Z6833 Body mass index (BMI) 33.0-33.9, adult: Secondary | ICD-10-CM | POA: Diagnosis not present

## 2018-06-15 DIAGNOSIS — I10 Essential (primary) hypertension: Secondary | ICD-10-CM

## 2018-06-15 DIAGNOSIS — J4520 Mild intermittent asthma, uncomplicated: Secondary | ICD-10-CM

## 2018-06-15 DIAGNOSIS — E559 Vitamin D deficiency, unspecified: Secondary | ICD-10-CM

## 2018-06-15 DIAGNOSIS — E785 Hyperlipidemia, unspecified: Secondary | ICD-10-CM

## 2018-06-15 LAB — BAYER DCA HB A1C WAIVED: HB A1C (BAYER DCA - WAIVED): 7.8 % — ABNORMAL HIGH (ref <7.0)

## 2018-06-15 MED ORDER — CLONIDINE HCL 0.1 MG PO TABS: 0.1000 mg | ORAL_TABLET | Freq: Two times a day (BID) | ORAL | 1 refills | Status: DC

## 2018-06-15 MED ORDER — CLONIDINE HCL 0.1 MG PO TABS: 0.1000 mg | ORAL_TABLET | Freq: Two times a day (BID) | ORAL | 0 refills | Status: DC

## 2018-06-15 MED ORDER — CLONIDINE HCL 0.1 MG PO TABS: 0.1000 mg | ORAL_TABLET | Freq: Three times a day (TID) | ORAL | 3 refills | Status: DC

## 2018-06-15 MED ORDER — GLIMEPIRIDE 4 MG PO TABS: 4.0000 mg | ORAL_TABLET | Freq: Every day | ORAL | 5 refills | Status: DC

## 2018-06-15 MED ORDER — GLIMEPIRIDE 4 MG PO TABS: 4.0000 mg | ORAL_TABLET | Freq: Two times a day (BID) | ORAL | 1 refills | Status: DC

## 2018-06-15 NOTE — Patient Instructions (Signed)
DASH Eating Plan  DASH stands for "Dietary Approaches to Stop Hypertension." The DASH eating plan is a healthy eating plan that has been shown to reduce high blood pressure (hypertension). It may also reduce your risk for type 2 diabetes, heart disease, and stroke. The DASH eating plan may also help with weight loss.  What are tips for following this plan?    General guidelines   Avoid eating more than 2,300 mg (milligrams) of salt (sodium) a day. If you have hypertension, you may need to reduce your sodium intake to 1,500 mg a day.   Limit alcohol intake to no more than 1 drink a day for nonpregnant women and 2 drinks a day for men. One drink equals 12 oz of beer, 5 oz of wine, or 1 oz of hard liquor.   Work with your health care provider to maintain a healthy body weight or to lose weight. Ask what an ideal weight is for you.   Get at least 30 minutes of exercise that causes your heart to beat faster (aerobic exercise) most days of the week. Activities may include walking, swimming, or biking.   Work with your health care provider or diet and nutrition specialist (dietitian) to adjust your eating plan to your individual calorie needs.  Reading food labels     Check food labels for the amount of sodium per serving. Choose foods with less than 5 percent of the Daily Value of sodium. Generally, foods with less than 300 mg of sodium per serving fit into this eating plan.   To find whole grains, look for the word "whole" as the first word in the ingredient list.  Shopping   Buy products labeled as "low-sodium" or "no salt added."   Buy fresh foods. Avoid canned foods and premade or frozen meals.  Cooking   Avoid adding salt when cooking. Use salt-free seasonings or herbs instead of table salt or sea salt. Check with your health care provider or pharmacist before using salt substitutes.   Do not fry foods. Cook foods using healthy methods such as baking, boiling, grilling, and broiling instead.   Cook with  heart-healthy oils, such as olive, canola, soybean, or sunflower oil.  Meal planning   Eat a balanced diet that includes:  ? 5 or more servings of fruits and vegetables each day. At each meal, try to fill half of your plate with fruits and vegetables.  ? Up to 6-8 servings of whole grains each day.  ? Less than 6 oz of lean meat, poultry, or fish each day. A 3-oz serving of meat is about the same size as a deck of cards. One egg equals 1 oz.  ? 2 servings of low-fat dairy each day.  ? A serving of nuts, seeds, or beans 5 times each week.  ? Heart-healthy fats. Healthy fats called Omega-3 fatty acids are found in foods such as flaxseeds and coldwater fish, like sardines, salmon, and mackerel.   Limit how much you eat of the following:  ? Canned or prepackaged foods.  ? Food that is high in trans fat, such as fried foods.  ? Food that is high in saturated fat, such as fatty meat.  ? Sweets, desserts, sugary drinks, and other foods with added sugar.  ? Full-fat dairy products.   Do not salt foods before eating.   Try to eat at least 2 vegetarian meals each week.   Eat more home-cooked food and less restaurant, buffet, and fast food.     When eating at a restaurant, ask that your food be prepared with less salt or no salt, if possible.  What foods are recommended?  The items listed may not be a complete list. Talk with your dietitian about what dietary choices are best for you.  Grains  Whole-grain or whole-wheat bread. Whole-grain or whole-wheat pasta. Brown rice. Oatmeal. Quinoa. Bulgur. Whole-grain and low-sodium cereals. Pita bread. Low-fat, low-sodium crackers. Whole-wheat flour tortillas.  Vegetables  Fresh or frozen vegetables (raw, steamed, roasted, or grilled). Low-sodium or reduced-sodium tomato and vegetable juice. Low-sodium or reduced-sodium tomato sauce and tomato paste. Low-sodium or reduced-sodium canned vegetables.  Fruits  All fresh, dried, or frozen fruit. Canned fruit in natural juice (without  added sugar).  Meat and other protein foods  Skinless chicken or turkey. Ground chicken or turkey. Pork with fat trimmed off. Fish and seafood. Egg whites. Dried beans, peas, or lentils. Unsalted nuts, nut butters, and seeds. Unsalted canned beans. Lean cuts of beef with fat trimmed off. Low-sodium, lean deli meat.  Dairy  Low-fat (1%) or fat-free (skim) milk. Fat-free, low-fat, or reduced-fat cheeses. Nonfat, low-sodium ricotta or cottage cheese. Low-fat or nonfat yogurt. Low-fat, low-sodium cheese.  Fats and oils  Soft margarine without trans fats. Vegetable oil. Low-fat, reduced-fat, or light mayonnaise and salad dressings (reduced-sodium). Canola, safflower, olive, soybean, and sunflower oils. Avocado.  Seasoning and other foods  Herbs. Spices. Seasoning mixes without salt. Unsalted popcorn and pretzels. Fat-free sweets.  What foods are not recommended?  The items listed may not be a complete list. Talk with your dietitian about what dietary choices are best for you.  Grains  Baked goods made with fat, such as croissants, muffins, or some breads. Dry pasta or rice meal packs.  Vegetables  Creamed or fried vegetables. Vegetables in a cheese sauce. Regular canned vegetables (not low-sodium or reduced-sodium). Regular canned tomato sauce and paste (not low-sodium or reduced-sodium). Regular tomato and vegetable juice (not low-sodium or reduced-sodium). Pickles. Olives.  Fruits  Canned fruit in a light or heavy syrup. Fried fruit. Fruit in cream or butter sauce.  Meat and other protein foods  Fatty cuts of meat. Ribs. Fried meat. Bacon. Sausage. Bologna and other processed lunch meats. Salami. Fatback. Hotdogs. Bratwurst. Salted nuts and seeds. Canned beans with added salt. Canned or smoked fish. Whole eggs or egg yolks. Chicken or turkey with skin.  Dairy  Whole or 2% milk, cream, and half-and-half. Whole or full-fat cream cheese. Whole-fat or sweetened yogurt. Full-fat cheese. Nondairy creamers. Whipped toppings.  Processed cheese and cheese spreads.  Fats and oils  Butter. Stick margarine. Lard. Shortening. Ghee. Bacon fat. Tropical oils, such as coconut, palm kernel, or palm oil.  Seasoning and other foods  Salted popcorn and pretzels. Onion salt, garlic salt, seasoned salt, table salt, and sea salt. Worcestershire sauce. Tartar sauce. Barbecue sauce. Teriyaki sauce. Soy sauce, including reduced-sodium. Steak sauce. Canned and packaged gravies. Fish sauce. Oyster sauce. Cocktail sauce. Horseradish that you find on the shelf. Ketchup. Mustard. Meat flavorings and tenderizers. Bouillon cubes. Hot sauce and Tabasco sauce. Premade or packaged marinades. Premade or packaged taco seasonings. Relishes. Regular salad dressings.  Where to find more information:   National Heart, Lung, and Blood Institute: www.nhlbi.nih.gov   American Heart Association: www.heart.org  Summary   The DASH eating plan is a healthy eating plan that has been shown to reduce high blood pressure (hypertension). It may also reduce your risk for type 2 diabetes, heart disease, and stroke.   With the   DASH eating plan, you should limit salt (sodium) intake to 2,300 mg a day. If you have hypertension, you may need to reduce your sodium intake to 1,500 mg a day.   When on the DASH eating plan, aim to eat more fresh fruits and vegetables, whole grains, lean proteins, low-fat dairy, and heart-healthy fats.   Work with your health care provider or diet and nutrition specialist (dietitian) to adjust your eating plan to your individual calorie needs.  This information is not intended to replace advice given to you by your health care provider. Make sure you discuss any questions you have with your health care provider.  Document Released: 12/13/2010 Document Revised: 12/18/2015 Document Reviewed: 12/18/2015  [employer]vier Interactive Patient Education  2019 [employer]

## 2018-06-15 NOTE — Progress Notes (Signed)
Established Patient Office Visit  Subjective:  Patient ID: Rita Payne, female    DOB: August 19, 1945  Age: 73 y.o. MRN: [id number]   Chief Complaint: medical management of chronic issues   HPI:  1. Essential hypertension No c/o chest pain, sob or headache. Saw DR. Dettinger on 05/25/18 and was started on amlodipine '5mg'$  nightly. She was already on micardis 80/25. Blood pressure at home has been normal some times but has been over 832 systolic and as high as 549. She does report some swelling.  BP Readings from Last 3 Encounters:  05/25/18 (!) 161/91  01/23/18 (!) 142/78  11/26/17 127/71     2. Gastroesophageal reflux disease without esophagitis Patient is on protonix daily and works well to keep symptoms under control.   3. Hyperlipidemia with target LDL less than 100 does not watch diet and does very little exercise.  4. Type 2 diabetes mellitus without complication, without long-term current use of insulin (HCC) Last hgba1c was 6.6%. her fasting blood sugars have been running 130-140. No hypogycemia  5. BMI 33.0-33.9,adult No recent weight chnages    Outpatient Encounter Medications as of 06/15/2018  Medication Sig  . amLODipine (NORVASC) 5 MG tablet Take 1 tablet (5 mg total) by mouth daily.  Marland Kitchen aspirin 81 MG tablet Take 1 tablet (81 mg total) by mouth daily. Resume 4 days post-op  . celecoxib (CELEBREX) 200 MG capsule TAKE 1 CAPSULE EVERY 12 HOURS  . cetirizine (ZYRTEC) 10 MG tablet TAKE 1 TABLET DAILY  . cholecalciferol (VITAMIN D) 1000 UNITS tablet Take 1,000 Units by mouth daily.  Marland Kitchen CRANBERRY PO Take 8,600 Units by mouth daily.   . diclofenac sodium (VOLTAREN) 1 % GEL Apply topically 4 (four) times daily as needed.  . fenofibrate (TRICOR) 145 MG tablet Take 1 tablet (145 mg total) by mouth daily.  . Fluticasone-Salmeterol (ADVAIR DISKUS) 250-50 MCG/DOSE AEPB USE 1 INHALATION EVERY 12 HOURS  . glimepiride (AMARYL) 2 MG tablet 1 po bid  . glucose blood (FREESTYLE LITE)  test strip Test qd. DX: E11.9  . metFORMIN (GLUCOPHAGE) 500 MG tablet Take 1 tablet (500 mg total) by mouth 2 (two) times daily with a meal.  . pantoprazole (PROTONIX) 40 MG tablet Take 1 tablet (4clonidine0 mg total) by mouth daily.  Marland Kitchen PROAIR HFA 108 (90 Base) MCG/ACT inhaler USE 2 INHALATIONS EVERY 6 HOURS AS NEEDED FOR WHEEZING  . telmisartan-hydrochlorothiazide (MICARDIS HCT) 80-25 MG tablet Take 1 tablet by mouth every morning.  Marland Kitchen VAGIFEM 10 MCG TABS vaginal tablet Place 1 tablet (10 mcg total) vaginally 2 (two) times a week.      New complaints: None today  Social history: Working part time.   . Past Surgical History:  Procedure Laterality Date  . ABDOMINAL HYSTERECTOMY  1987  . BELPHAROPTOSIS REPAIR Bilateral 2009   9   . CHOLECYSTECTOMY  1989  . HERNIA REPAIR    . kidney tumor removal   1986  . KNEE ARTHROSCOPY Bilateral   . LUMBAR LAMINECTOMY/DECOMPRESSION MICRODISCECTOMY N/A 12/08/2013   Procedure: LUMBAR DECOMPRESSION L4-L5,  L3-L4 ;  Surgeon: Johnn Hai, MD;  Location: [facility];  Service: Orthopedics;  Laterality: N/A;  . PUBOVAGINAL SLING N/A 10/03/2014   Procedure: Gaynelle Arabian;  Surgeon: Carolan Clines, MD;  [facility];  Service: Urology;  Laterality: N/A;  . TONSILLECTOMY    . TOTAL HIP ARTHROPLASTY Right 08/25/2012   Procedure: RIGHT TOTAL HIP ARTHROPLASTY ANTERIOR APPROACH;  Surgeon: [name], MD;  Location: [facility];  Service: Orthopedics;  Laterality: Right;    Family History  Problem Relation Age of Onset  . Diabetes Mother   . Heart disease Mother   . Stroke Mother   . Heart attack Mother   . Heart disease Father   . Stroke Father   . Vision loss Brother   . Arthritis/Rheumatoid Brother   . Diabetes Sister   . Heart disease Sister   . Heart failure Sister   . Diabetes Sister   . Stroke Sister   . Colon polyps Sister   . Heart disease Brother   . Heart failure Brother   . Heart attack Brother   .  Arrhythmia Brother   . Arthritis/Rheumatoid Sister   . Colon polyps Sister   . Colon cancer Neg Hx   . Esophageal cancer Neg Hx   . Stomach cancer Neg Hx   . Rectal cancer Neg Hx     Social History   Socioeconomic History  . Marital status: Widowed    Spouse name: Not on file  . Number of children: Not on file  . Years of education: Not on file  . Highest education level: Not on file  Tobacco Use  . Smoking status: Current Every Day Smoker    Packs/day: 0.25    Years: 33.00    Pack years: 8.25    Types: Cigarettes  . Smokeless tobacco: Never Used  Substance and Sexual Activity  . Alcohol use: No  . Drug use: No  . Sexual activity: Not Currently   Subjective    Outpatient Medications Prior to Visit  Medication Sig Dispense Refill  . amLODipine (NORVASC) 5 MG tablet Take 1 tablet (5 mg total) by mouth daily. 30 tablet 0  . aspirin 81 MG tablet Take 1 tablet (81 mg total) by mouth daily. Resume 4 days post-op 30 tablet   . celecoxib (CELEBREX) 200 MG capsule TAKE 1 CAPSULE EVERY 12 HOURS 180 capsule 2  . cetirizine (ZYRTEC) 10 MG tablet TAKE 1 TABLET DAILY 90 tablet 1  . cholecalciferol (VITAMIN D) 1000 UNITS tablet Take 1,000 Units by mouth daily.    Marland Kitchen CRANBERRY PO Take 8,600 Units by mouth daily.     . diclofenac sodium (VOLTAREN) 1 % GEL Apply topically 4 (four) times daily as needed.    . fenofibrate (TRICOR) 145 MG tablet Take 1 tablet (145 mg total) by mouth daily. 90 tablet 1  . Fluticasone-Salmeterol (ADVAIR DISKUS) 250-50 MCG/DOSE AEPB USE 1 INHALATION EVERY 12 HOURS 180 each 1  . glimepiride (AMARYL) 2 MG tablet 1 po bid 180 tablet 1  . glucose blood (FREESTYLE LITE) test strip Test qd. DX: E11.9 100 each 3  . metFORMIN (GLUCOPHAGE) 500 MG tablet Take 1 tablet (500 mg total) by mouth 2 (two) times daily with a meal. 180 tablet 1  . pantoprazole (PROTONIX) 40 MG tablet Take 1 tablet (40 mg total) by mouth daily. 90 tablet 1  . PROAIR HFA 108 (90 Base) MCG/ACT  inhaler USE 2 INHALATIONS EVERY 6 HOURS AS NEEDED FOR WHEEZING 25.5 g 4  . telmisartan-hydrochlorothiazide (MICARDIS HCT) 80-25 MG tablet Take 1 tablet by mouth every morning. 90 tablet 1  . VAGIFEM 10 MCG TABS vaginal tablet Place 1 tablet (10 mcg total) vaginally 2 (two) times a week. 24 tablet 1   Facility-Administered Medications Prior to Visit  Medication Dose Route Frequency Provider Last Rate Last Dose  . magnesium citrate solution 1 Bottle  1 Bottle Oral Once Carolan Clines,  MD      . sodium phosphate (FLEET) 7-19 GM/118ML enema 1 enema  1 enema Rectal Once Carolan Clines, MD        Allergies  Allergen Reactions  . Ivp Dye [Iodinated Diagnostic Agents] Anaphylaxis  . Shellfish Allergy Anaphylaxis  . Vasotec [Enalapril] Shortness Of Breath  . Adhesive [Tape] Other (See Comments)    blisters  . Iodine Other (See Comments)    Iodine that is applied to skin causes blisters   . Pyridium [Phenazopyridine Hcl]     blisters    ROS Review of Systems    Objective:    Physical Exam  LMP 04/14/1982  Wt Readings from Last 3 Encounters:  05/25/18 193 lb 9.6 oz (87.8 kg)  01/23/18 191 lb (86.6 kg)  11/26/17 192 lb (87.1 kg)     Health Maintenance Due  Topic Date Due  . TETANUS/TDAP  07/14/2017  . HEMOGLOBIN A1C  04/24/2018    There are no preventive care reminders to display for this patient.  No results found for: TSH Lab Results  Component Value Date   WBC 5.0 04/23/2016   HGB 15.7 04/23/2016   HCT 47.1 (H) 04/23/2016   MCV 86 04/23/2016   PLT 219 04/23/2016   Lab Results  Component Value Date   NA 139 01/23/2018   K 4.5 01/23/2018   CO2 24 01/23/2018   GLUCOSE 130 (H) 01/23/2018   BUN 16 01/23/2018   CREATININE 0.73 01/23/2018   BILITOT 0.5 01/23/2018   ALKPHOS 66 01/23/2018   AST 39 01/23/2018   ALT 48 (H) 01/23/2018   PROT 6.7 01/23/2018   ALBUMIN 4.4 01/23/2018   CALCIUM 9.8 01/23/2018   ANIONGAP 16 (H) 12/09/2013   Lab Results   Component Value Date   CHOL 213 (H) 01/23/2018   Lab Results  Component Value Date   HDL 33 (L) 01/23/2018   Lab Results  Component Value Date   LDLCALC 110 (H) 01/23/2018   Lab Results  Component Value Date   TRIG 349 (H) 01/23/2018   Lab Results  Component Value Date   CHOLHDL 6.5 (H) 01/23/2018   Lab Results  Component Value Date   HGBA1C 6.6 01/23/2018   hGBa1C 7.8% today   Assessment & Plan:   Rita Payne comes in today with chief complaint of Recheck BP   Diagnosis and orders addressed:  1. Essential hypertension Low sodium diet Changed amlodipine to clonidine - CMP14+EGFR - cloNIDine (CATAPRES) 0.1 MG tablet; Take 1 tablet (0.1 mg total) by mouth 3 (three) times daily.  Dispense: 90 tablet; Refill: 3  2. Hyperlipidemia with target LDL less than 100 Low fat diet - Lipid panel  3. Type 2 diabetes mellitus without complication, without long-term current use of insulin (HCC) Stricter carb counting Increased amaryl from '2mg'$  to '4mg'$  bid - Bayer DCA Hb A1c Waived - glimepiride (AMARYL) 4 MG tablet; Take 1 tablet (4 mg total) by mouth daily with breakfast.  Dispense: 30 tablet; Refill: 5  4. Vitamin D deficiency Continue vitamin d supplemensts  5. Smoker Smoking cessation encouraged  6. BMI 33.0-33.9,adult Discussed diet and exercise for person with BMI >25 Will recheck weight in 3-6 months    Labs pending Health Maintenance reviewed Diet and exercise encouraged  Follow up plan: As schedules in 3 months   [place], FNP

## 2018-06-16 LAB — CMP14+EGFR
ALT: 58 IU/L — ABNORMAL HIGH (ref 0–32)
AST: 46 IU/L — ABNORMAL HIGH (ref 0–40)
Albumin/Globulin Ratio: 1.8 (ref 1.2–2.2)
Albumin: 4.3 g/dL (ref 3.7–4.7)
Alkaline Phosphatase: 71 IU/L (ref 39–117)
BUN/Creatinine Ratio: 19 (ref 12–28)
BUN: 13 mg/dL (ref 8–27)
Bilirubin Total: 0.6 mg/dL (ref 0.0–1.2)
CO2: 25 mmol/L (ref 20–29)
Calcium: 9.6 mg/dL (ref 8.7–10.3)
Chloride: 96 mmol/L (ref 96–106)
Creatinine, Ser: 0.69 mg/dL (ref 0.57–1.00)
GFR calc Af Amer: 101 mL/min/{1.73_m2} (ref >59)
GFR calc non Af Amer: 88 mL/min/{1.73_m2} (ref >59)
Globulin, Total: 2.4 g/dL (ref 1.5–4.5)
Glucose: 181 mg/dL — ABNORMAL HIGH (ref 65–99)
Potassium: 3.8 mmol/L (ref 3.5–5.2)
Sodium: 136 mmol/L (ref 134–144)
Total Protein: 6.7 g/dL (ref 6.0–8.5)

## 2018-06-16 LAB — LIPID PANEL
Chol/HDL Ratio: 7.1 ratio — ABNORMAL HIGH (ref 0.0–4.4)
Cholesterol, Total: 220 mg/dL — ABNORMAL HIGH (ref 100–199)
HDL: 31 mg/dL — ABNORMAL LOW (ref >39)
LDL Calculated: 112 mg/dL — ABNORMAL HIGH (ref 0–99)
Triglycerides: 387 mg/dL — ABNORMAL HIGH (ref 0–149)
VLDL Cholesterol Cal: 77 mg/dL — ABNORMAL HIGH (ref 5–40)

## 2018-06-18 ENCOUNTER — Other Ambulatory Visit: Payer: Self-pay

## 2018-06-18 ENCOUNTER — Ambulatory Visit (INDEPENDENT_AMBULATORY_CARE_PROVIDER_SITE_OTHER): Payer: Medicare Other | Admitting: *Deleted

## 2018-06-18 ENCOUNTER — Encounter: Payer: Self-pay | Admitting: *Deleted

## 2018-06-18 DIAGNOSIS — Z0000 Encounter for general adult medical examination without abnormal findings: Secondary | ICD-10-CM | POA: Diagnosis not present

## 2018-06-18 NOTE — Progress Notes (Addendum)
MEDICARE ANNUAL WELLNESS VISIT  06/18/2018  Telephone Visit Disclaimer This Medicare AWV was conducted by telephone due to national recommendations for restrictions regarding the COVID-19 Pandemic (e.g. social distancing).  I verified, using two identifiers, that I am speaking with Rita Payne or their authorized healthcare agent. I discussed the limitations, risks, security, and privacy concerns of performing an evaluation and management service by telephone and the potential availability of an in-person appointment in the future. The patient expressed understanding and agreed to proceed.   Subjective:  Rita Payne is a 73 y.o. female patient of Chevis Pretty, [place] who had a Medicare Annual Wellness Visit today via telephone. 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  She lives alone with her 2 dogs. Rita Payne has 2 children and 2 grandchildren. She reports that she is socially active and does interact with friends/family regularly. She is minimally physically active and enjoys reading.  She had joined [employer] and was exercising 3 times per week before COVID 19 restrictions.  She plans to return to her exercise regimen when gyms reopen.  Patient Care Team: Chevis Pretty, FNP as PCP - Rita (Family Medicine) Paralee Cancel, MD as Consulting Physician (Orthopedic Surgery) Susa Day, MD as Consulting Physician (Orthopedic Surgery) Sandford Craze, MD as Referring Physician (Dermatology) Carolan Clines, MD (Inactive) as Consulting Physician (Urology)  Advanced Directives 06/18/2018 06/11/2017 06/05/2016 10/03/2014 09/29/2014 12/08/2013 12/08/2013  Does Patient Have a Medical Advance Directive? Yes Yes Yes Yes Yes Yes Yes  Type of Paramedic of [place];Living will [place];Living will Healthcare Power of [place];Living will - Living will;Advance instruction for mental health treatment Living  will;Healthcare Power of Attorney  Does patient want to make changes to medical advance directive? No - Patient declined - No - Patient declined No - Patient declined - No - Patient declined No - Patient declined  Copy of [place] in Chart? No - copy requested No - copy requested No - copy requested No - copy requested No - copy requested No - copy requested No - copy requested  Pre-existing out of facility DNR order (yellow form or pink MOST form) - - - - - - [facility] Utilization Over the Past 12 Months: # of hospitalizations or ER visits: 0 # of surgeries: 0  Review of Systems    Patient reports that her overall health is unchanged compared to last year.    Review of Systems:   All systems negative as reported by patient.  Pain Assessment Pain Score: 0-No pain     Current Medications & Allergies (verified) Allergies as of 06/18/2018      Reactions   Ivp Dye [iodinated Diagnostic Agents] Anaphylaxis   Shellfish Allergy Anaphylaxis   Vasotec [enalapril] Shortness Of Breath   Adhesive [tape] Other (See Comments)   blisters   Amlodipine    swelling   Iodine Other (See Comments)   Iodine that is applied to skin causes blisters    Pyridium [phenazopyridine Hcl]    blisters      Medication List       Accurate as of June 18, 2018  4:00 PM. If you have any questions, ask your nurse or doctor.        aspirin 81 MG tablet Take 1 tablet (81 mg total) by mouth daily. Resume 4 days post-op   celecoxib 200 MG capsule Commonly known as: CELEBREX TAKE 1 CAPSULE EVERY 12  HOURS   cetirizine 10 MG tablet Commonly known as: ZYRTEC TAKE 1 TABLET DAILY   cholecalciferol 1000 units tablet Commonly known as: VITAMIN D Take 1,000 Units by mouth daily.   cloNIDine 0.1 MG tablet Commonly known as: CATAPRES Take 1 tablet (0.1 mg total) by mouth 2 (two) times daily.   CRANBERRY PO Take 8,600 Units by mouth daily.   diclofenac sodium 1 % Gel Commonly  known as: VOLTAREN Apply topically 4 (four) times daily as needed.   fenofibrate 145 MG tablet Commonly known as: Tricor Take 1 tablet (145 mg total) by mouth daily.   Fluticasone-Salmeterol 250-50 MCG/DOSE Aepb Commonly known as: Advair Diskus USE 1 INHALATION EVERY 12 HOURS   glimepiride 4 MG tablet Commonly known as: AMARYL Take 1 tablet (4 mg total) by mouth 2 (two) times a day.   glucose blood test strip Commonly known as: FREESTYLE LITE Test qd. DX: E11.9   metFORMIN 500 MG tablet Commonly known as: GLUCOPHAGE Take 1 tablet (500 mg total) by mouth 2 (two) times daily with a meal.   pantoprazole 40 MG tablet Commonly known as: PROTONIX Take 1 tablet (40 mg total) by mouth daily.   ProAir HFA 108 (90 Base) MCG/ACT inhaler Generic drug: albuterol USE 2 INHALATIONS EVERY 6 HOURS AS NEEDED FOR WHEEZING   telmisartan-hydrochlorothiazide 80-25 MG tablet Commonly known as: Micardis HCT Take 1 tablet by mouth every morning.   Vagifem 10 MCG Tabs vaginal tablet Generic drug: Estradiol Place 1 tablet (10 mcg total) vaginally 2 (two) times a week.       History (reviewed): Past Medical History:  Diagnosis Date  . Allergy   . Arthritis   . Basal cell carcinoma   . Diarrhea   . First degree heart block   . GERD (gastroesophageal reflux disease)   . History of kidney stones   . Hyperlipidemia   . Hypertension   . Leg fracture   . Melanoma ([place])   . Mild intermittent asthma   . SUI (stress urinary incontinence, female)   . Type 2 diabetes mellitus ([place])    Past Surgical History:  Procedure Laterality Date  . ABDOMINAL HYSTERECTOMY  1987  . BELPHAROPTOSIS REPAIR Bilateral 2009   9   . CHOLECYSTECTOMY  1989  . HERNIA REPAIR    . kidney tumor removal   1986  . KNEE ARTHROSCOPY Bilateral   . LUMBAR LAMINECTOMY/DECOMPRESSION MICRODISCECTOMY N/A 12/08/2013   Procedure: LUMBAR DECOMPRESSION L4-L5,  L3-L4 ;  Surgeon: Johnn Hai, MD;  Location: [facility];  Service:  Orthopedics;  Laterality: N/A;  . PUBOVAGINAL SLING N/A 10/03/2014   Procedure: Gaynelle Arabian;  Surgeon: Carolan Clines, MD;  Location: [facility];  Service: Urology;  Laterality: N/A;  . TONSILLECTOMY    . TOTAL HIP ARTHROPLASTY Right 08/25/2012   Procedure: RIGHT TOTAL HIP ARTHROPLASTY ANTERIOR APPROACH;  Surgeon: [name], MD;  Location: [facility];  Service: Orthopedics;  Laterality: Right;   Family History  Problem Relation Age of Onset  . Diabetes Mother   . Heart disease Mother   . Stroke Mother   . Heart attack Mother   . Heart disease Father   . Stroke Father   . Vision loss Brother   . Arthritis/Rheumatoid Brother   . Diabetes Sister   . Heart disease Sister   . Heart failure Sister   . Diabetes Sister   . Stroke Sister   . Colon polyps Sister   . Heart disease Brother   .  Heart failure Brother   . Heart attack Brother   . Arrhythmia Brother   . Arthritis/Rheumatoid Sister   . Colon polyps Sister   . Colon cancer Neg Hx   . Esophageal cancer Neg Hx   . Stomach cancer Neg Hx   . Rectal cancer Neg Hx    Social History   Socioeconomic History  . Marital status: Widowed    Spouse name: Not on file  . Number of children: 2  . Years of education: Not on file  . Highest education level: Associate degree: academic program  Occupational History  . Occupation: Radio broadcast assistant   Social Needs  . Financial resource strain: Not hard at all  . Food insecurity    Worry: Never true    Inability: Never true  . Transportation needs    Medical: No    Non-medical: No  Tobacco Use  . Smoking status: Current Every Day Smoker    Packs/day: 0.25    Years: 33.00    Pack years: 8.25    Types: Cigarettes  . Smokeless tobacco: Never Used  Substance and Sexual Activity  . Alcohol use: No  . Drug use: No  . Sexual activity: Not Currently  Lifestyle  . Physical activity    Days per week: 0 days    Minutes per session: 0 min  . Stress: Only a little   Relationships  . Social connections    Talks on phone: More than three times a week    Gets together: More than three times a week    Attends religious service: Never    Active member of club or organization: No    Attends meetings of clubs or organizations: Never    Relationship status: Widowed  Other Topics Concern  . Not on file  Social History Narrative  . Not on file    Activities of Daily Living In your present state of health, do you have any difficulty performing the following activities: 06/18/2018  Hearing? N  Vision? N  Difficulty concentrating or making decisions? N  Walking or climbing stairs? Y  Comment Due to hip and knee pain  Dressing or bathing? N  Doing errands, shopping? N  Preparing Food and eating ? N  Using the Toilet? N  In the past six months, have you accidently leaked urine? N  Do you have problems with loss of bowel control? N  Managing your Medications? N  Managing your Finances? N  Housekeeping or managing your Housekeeping? N  Some recent data might be hidden        Exercise    Patient does not currently participate in regular exercise  Diet Patient reports consuming 3 meals a day and 1 snack(s) a day Patient reports that her primary diet is: Regular, Diabetic Patient reports that she does have regular access to food.   Depression Screen PHQ 2/9 Scores 06/18/2018 06/15/2018 05/25/2018 01/23/2018 11/26/2017 09/03/2017 06/11/2017  PHQ - 2 Score 0 0 0 0 0 0 0  PHQ- 9 Score - - - - - - -     Fall Risk Fall Risk  06/18/2018 06/15/2018 01/23/2018 11/26/2017 09/03/2017  Falls in the past year? 0 0 0 0 No  Number falls in past yr: - - - - -  Injury with Fall? - - - - -  Risk for fall due to : - - - - -  Risk for fall due to: Comment - - - - -  Follow up Falls prevention discussed - - - -  Objective:  Rita Payne seemed alert and oriented and she participated appropriately during our telephone visit.  Blood Pressure Weight BMI  BP Readings  from Last 3 Encounters:  06/15/18 137/83  05/25/18 (!) 161/91  01/23/18 (!) 142/78   Wt Readings from Last 3 Encounters:  06/15/18 193 lb (87.5 kg)  05/25/18 193 lb 9.6 oz (87.8 kg)  01/23/18 191 lb (86.6 kg)   BMI Readings from Last 1 Encounters:  06/15/18 33.13 kg/m    *Unable to obtain current vital signs, weight, and BMI due to telephone visit type  Hearing/Vision  . Rita Payne did not seem to have difficulty with hearing/understanding during the telephone conversation . Reports that she has had a formal eye exam by an eye care professional within the past year . Reports that she has not had a formal hearing evaluation within the past year *Unable to fully assess hearing and vision during telephone visit type  Cognitive Function: 6CIT Screen 06/18/2018  What Year? 0 points  What month? 0 points  What time? 0 points  Count back from 20 0 points  Months in reverse 0 points  Repeat phrase 2 points  Total Score 2   (Normal:0-7, Significant for Dysfunction: >8)  Normal Cognitive Function Screening: Yes   Immunization & Health Maintenance Record Immunization History  Administered Date(s) Administered  . Influenza, High Dose Seasonal PF 11/08/2016, 10/10/2017  . Influenza,inj,Quad PF,6+ Mos 10/21/2012, 11/11/2013, 12/23/2014, 10/05/2015  . Pneumococcal Conjugate-13 06/01/2014  . Pneumococcal Polysaccharide-23 07/14/2012  . Tdap 07/15/2007    Health Maintenance  Topic Date Due  . TETANUS/TDAP  07/14/2017  . INFLUENZA VACCINE  08/08/2018  . HEMOGLOBIN A1C  09/15/2018  . FOOT EXAM  10/11/2018  . OPHTHALMOLOGY EXAM  12/30/2018  . MAMMOGRAM  03/03/2020  . Fecal DNA (Cologuard)  06/17/2020  . DEXA SCAN  Completed  . Hepatitis C Screening  Completed  . PNA vac Low Risk Adult  Completed       Assessment  This is a routine wellness examination for Rita Payne.  Health Maintenance: Due or Overdue Health Maintenance Due  Topic Date Due  . TETANUS/TDAP  07/14/2017    Recommend Td vaccine at next in person office visit.   Rita Payne does not need a referral for Community Assistance: Care Management:   no Social Work:    no Prescription Assistance:  no Nutrition/Diabetes Education:  no   Plan:  Rita Payne    . Exercise 150 minutes per week (moderate activity)     Try to exercise for 30 minutes 5 times per week.    . Quit Smoking     Patient plans to talk with Chevis Pretty regarding smoking cessation aids, set a quit date and work toward quitting smoking.          Personalized Health Maintenance & Screening Recommendations  Td vaccine Smoking cessation counseling Advanced directives: has an advanced directive - a copy HAS NOT been provided.  Shingrix vaccine series  Lung Cancer Screening Recommended: yes (Low Dose CT Chest recommended if Age 63-80 years, 30 pack-year currently smoking OR have quit w/in past 15 years) Hepatitis C Screening recommended: completed 03/23/2015   Advanced Directives: Written information was not prepared per patient's request.  Referrals & Orders N/A  Follow-up Plan . Follow-up with Chevis Pretty, FNP as planned . Please work on your goals of increasing activity and smoking cessation . At your convenience, please bring a copy of your Advance Directives ([place]  and Living Will) to our office to be filed in your medical record.    I have personally reviewed and noted the following in the patient's chart:   . Medical and social history . Use of alcohol, tobacco or illicit drugs  . Current medications and supplements . Functional ability and status . Nutritional status . Physical activity . Advanced directives . List of other physicians . Hospitalizations, surgeries, and ER visits in previous 12 months . Vitals . Screenings to include cognitive, depression, and falls . Referrals and appointments  In addition, I have reviewed and discussed with  Rita Payne certain preventive protocols, quality metrics, and best practice recommendations. A written personalized care plan for preventive services as well as Rita preventive health recommendations is available and can be mailed to the patient at her request.      Nolberto Hanlon, RN 06/18/2018  I have reviewed and agree with the above AWV documentation.   Mary-Margaret Hassell Done, FNP

## 2018-06-18 NOTE — Patient Instructions (Signed)
  Ms. Rita Payne , Thank you for taking time to talk with me for your Medicare Wellness Visit. I appreciate your ongoing commitment to your health goals. Please review the following plan we discussed and let me know if I can assist you in the future.   These are the goals we discussed: Goals    . Exercise 150 minutes per week (moderate activity)     Try to exercise for 30 minutes 5 times per week.    . Quit Smoking     Patient plans to talk with Chevis Pretty regarding smoking cessation aids, set a quit date and work toward quitting smoking.        This is a list of the screening recommended for you and due dates:  Health Maintenance  Topic Date Due  . Tetanus Vaccine  07/14/2017  . Flu Shot  08/08/2018  . Hemoglobin A1C  09/15/2018  . Complete foot exam   10/11/2018  . Eye exam for diabetics  12/30/2018  . Mammogram  03/03/2020  . Cologuard (Stool DNA test)  06/17/2020  . DEXA scan (bone density measurement)  Completed  .  Hepatitis C: One time screening is recommended by Center for Disease Control  (CDC) for  adults born from [birth year] through 1963.   Completed  . Pneumonia vaccines  Completed

## 2018-08-14 ENCOUNTER — Ambulatory Visit: Payer: Medicare Other | Admitting: Nurse Practitioner

## 2018-08-20 ENCOUNTER — Other Ambulatory Visit: Payer: Self-pay

## 2018-08-20 ENCOUNTER — Ambulatory Visit (AMBULATORY_SURGERY_CENTER): Payer: Self-pay

## 2018-08-20 VITALS — Ht 64.0 in | Wt 190.2 lb

## 2018-08-20 DIAGNOSIS — Z86010 Personal history of colonic polyps: Secondary | ICD-10-CM

## 2018-08-20 MED ORDER — NA SULFATE-K SULFATE-MG SULF 17.5-3.13-1.6 GM/177ML PO SOLN: 1.0000 | Freq: Once | ORAL | 0 refills | Status: AC

## 2018-08-20 NOTE — Progress Notes (Signed)
Denies allergies to eggs or soy products. Denies complication of anesthesia or sedation. Denies use of weight loss medication. Denies use of O2.   Emmi instructions given for colonoscopy.  

## 2018-08-25 ENCOUNTER — Telehealth: Payer: Self-pay | Admitting: Nurse Practitioner

## 2018-08-25 ENCOUNTER — Other Ambulatory Visit: Payer: Self-pay

## 2018-08-26 ENCOUNTER — Encounter: Payer: Self-pay | Admitting: Family Medicine

## 2018-08-26 ENCOUNTER — Ambulatory Visit (INDEPENDENT_AMBULATORY_CARE_PROVIDER_SITE_OTHER): Payer: Medicare Other | Admitting: Family Medicine

## 2018-08-26 VITALS — BP 114/71 | HR 86 | Temp 99.5°F | Ht 64.0 in | Wt 188.0 lb

## 2018-08-26 DIAGNOSIS — E1169 Type 2 diabetes mellitus with other specified complication: Secondary | ICD-10-CM

## 2018-08-26 DIAGNOSIS — I152 Hypertension secondary to endocrine disorders: Secondary | ICD-10-CM

## 2018-08-26 DIAGNOSIS — E119 Type 2 diabetes mellitus without complications: Secondary | ICD-10-CM

## 2018-08-26 DIAGNOSIS — E785 Hyperlipidemia, unspecified: Secondary | ICD-10-CM | POA: Diagnosis not present

## 2018-08-26 DIAGNOSIS — E559 Vitamin D deficiency, unspecified: Secondary | ICD-10-CM | POA: Diagnosis not present

## 2018-08-26 DIAGNOSIS — E1159 Type 2 diabetes mellitus with other circulatory complications: Secondary | ICD-10-CM | POA: Diagnosis not present

## 2018-08-26 DIAGNOSIS — J4520 Mild intermittent asthma, uncomplicated: Secondary | ICD-10-CM | POA: Diagnosis not present

## 2018-08-26 DIAGNOSIS — I10 Essential (primary) hypertension: Secondary | ICD-10-CM | POA: Diagnosis not present

## 2018-08-26 DIAGNOSIS — R748 Abnormal levels of other serum enzymes: Secondary | ICD-10-CM

## 2018-08-26 LAB — BAYER DCA HB A1C WAIVED: HB A1C (BAYER DCA - WAIVED): 7.8 % — ABNORMAL HIGH (ref <7.0)

## 2018-08-26 MED ORDER — ALBUTEROL SULFATE HFA 108 (90 BASE) MCG/ACT IN AERS: 2.0000 | INHALATION_SPRAY | Freq: Four times a day (QID) | RESPIRATORY_TRACT | 4 refills | Status: DC | PRN

## 2018-08-26 MED ORDER — EMPAGLIFLOZIN 10 MG PO TABS: 10.0000 mg | ORAL_TABLET | Freq: Every day | ORAL | 4 refills | Status: DC

## 2018-08-26 NOTE — Patient Instructions (Signed)

## 2018-08-26 NOTE — Progress Notes (Signed)
Subjective:  Patient ID: Rita Payne, female    DOB: [birth year], 73 y.o.   MRN: [id number]  Patient Care Team: Baruch Gouty, FNP as PCP - General (Family Medicine) Paralee Cancel, MD as Consulting Physician (Orthopedic Surgery) Susa Day, MD as Consulting Physician (Orthopedic Surgery) Sandford Craze, MD as Referring Physician (Dermatology) Carolan Clines, MD (Inactive) as Consulting Physician (Urology)   Chief Complaint:  Medical Management of Chronic Issues (3- 62mo, Hyperlipidemia, Hypertension, and Diabetes   HPI: [name] a 73y.o. female presenting on 08/26/2018 for Medical Management of Chronic Issues (3- 628mo Hyperlipidemia, Hypertension, and Diabetes   1. Type 2 diabetes mellitus without complication, without long-term current use of insulin (HCC)   [name] a [age].o. female who presents for a follow up evaluation of Type 2 diabetes mellitus.  Current symptoms include hyperglycemia and have been stable. Symptoms have been present for several weeks. Patient denies foot ulcerations, hypoglycemia , increased appetite, nausea, paresthesia of the feet, polydipsia, polyuria, visual disturbances, vomiting and weight loss.  Current diabetic medications include metformin and glimepiride.  Compliant with meds - Yes but having significant diarrhea with metformin. Has cut dose in half but did not stop diarrhea.   Current monitoring regimen: home blood tests - daily Home blood sugar records: elevations in AM, normal in evening Any episodes of hypoglycemia? no  Known diabetic complications: cardiovascular disease Cardiovascular risk factors: advanced age (older than 5539or men, 6545or women), diabetes mellitus, dyslipidemia, hypertension and obesity (BMI >= 30 kg/m2) Eye exam current (within one year): yes Podiatry yearly?  Yes Weight trend: stable Prior visit with CDE: no Current diet: well balanced Current exercise: none  PNA Vaccine UTD?  Yes Hep B  Vaccine?  Yes Tdap Vaccine UTD?  No  Is She on ACE inhibitor or angiotensin II receptor blocker?  Yes  telmisartan (Micardis)  Is She on statin? No  Is She on ASA 81 mg daily?  Yes  Lab Review Glucose (mg/dL)  Date Value  06/15/2018 181 (H)  01/23/2018 130 (H)  10/10/2017 119 (H)   Glucose, Bld (mg/dL)  Date Value  12/09/2013 180 (H)  11/30/2013 159 (H)  08/26/2012 137 (H)   CO2 (mmol/L)  Date Value  06/15/2018 25  01/23/2018 24  10/10/2017 27   BUN (mg/dL)  Date Value  06/15/2018 13  01/23/2018 16  10/10/2017 18   Creat (mg/dL)  Date Value  07/14/2012 0.96  04/13/2012 0.96   Creatinine, Ser (mg/dL)  Date Value  06/15/2018 0.69  01/23/2018 0.73  10/10/2017 0.78   Lab Results  Component Value Date   HGBA1C 7.8 (H) 06/15/2018   HGBA1C 6.6 01/23/2018   HGBA1C 6.6 10/10/2017   Urine Microalbumin/Creat Ratio: 10.7 11/26/2017   2. Vitamin D deficiency  Pt is taking oral repletion therapy. Denies bone pain and tenderness, muscle weakness, fracture, and difficulty walking. Lab Results  Component Value Date   VD25OH 25.3 (L) 02/14/2017   Lab Results  Component Value Date   CALCIUM 9.6 06/15/2018      3. Hypertension associated with type 2 diabetes mellitus ([place] Complaint with meds - Yes Current Medications - catapres, micardis hct Checking BP at home and ranging - 120/70 Exercising Regularly - No Watching Salt intake - Yes Pertinent ROS:  Headache - No Fatigue - No Visual Disturbances - No Chest pain - No Dyspnea - No Palpitations - No LE edema - No They report good compliance with medications and can  restate their regimen by memory. No medication side effects.  Family, social, and smoking history reviewed.   BP Readings from Last 3 Encounters:  08/26/18 114/71  06/15/18 137/83  05/25/18 (!) 161/91   CMP Latest Ref Rng & Units 06/15/2018 01/23/2018 10/10/2017  Glucose 65 - 99 mg/dL 181(H) 130(H) 119(H)  BUN 8 - 27 mg/dL '13 16 18   '$ Creatinine 0.57 - 1.00 mg/dL 0.69 0.73 0.78  Sodium 134 - 144 mmol/L 136 139 141  Potassium 3.5 - 5.2 mmol/L 3.8 4.5 4.5  Chloride 96 - 106 mmol/L 96 97 97  CO2 20 - 29 mmol/L '25 24 27  '$ Calcium 8.7 - 10.3 mg/dL 9.6 9.8 9.6  Total Protein 6.0 - 8.5 g/dL 6.7 6.7 6.6  Total Bilirubin 0.0 - 1.2 mg/dL 0.6 0.5 0.3  Alkaline Phos 39 - 117 IU/L 71 66 68  AST 0 - 40 IU/L 46(H) 39 26  ALT 0 - 32 IU/L 58(H) 48(H) 34(H)      4. Hyperlipidemia associated with type 2 diabetes mellitus ([place])  Not on statin therapy Diet - well balanced Exercise - none  Lab Results  Component Value Date   CHOL 220 (H) 06/15/2018   HDL 31 (L) 06/15/2018   LDLCALC 112 (H) 06/15/2018   TRIG 387 (H) 06/15/2018   CHOLHDL 7.1 (H) 06/15/2018     Family and personal medical history reviewed. Smoking and ETOH history reviewed.    5. Mild intermittent chronic asthma without complication  Doing well with current medications. No recent exacerbations. Does need refill on SABA     Relevant past medical, surgical, family, and social history reviewed and updated as indicated.  Allergies and medications reviewed and updated. Date reviewed: Chart in Epic.   Past Medical History:  Diagnosis Date  . Allergy   . Arthritis   . Basal cell carcinoma   . Cataract   . Diarrhea   . First degree heart block   . GERD (gastroesophageal reflux disease)   . History of kidney stones   . Hyperlipidemia   . Hypertension   . Leg fracture   . Melanoma ([place])   . Mild intermittent asthma   . SUI (stress urinary incontinence, female)   . Type 2 diabetes mellitus ([place])     Past Surgical History:  Procedure Laterality Date  . ABDOMINAL HYSTERECTOMY  1987  . BELPHAROPTOSIS REPAIR Bilateral 2009   9   . CHOLECYSTECTOMY  1989  . HERNIA REPAIR    . kidney tumor removal   1986  . KNEE ARTHROSCOPY Bilateral   . LUMBAR LAMINECTOMY/DECOMPRESSION MICRODISCECTOMY N/A 12/08/2013   Procedure: LUMBAR DECOMPRESSION L4-L5,  L3-L4 ;   Surgeon: Johnn Hai, MD;  Location: [facility];  Service: Orthopedics;  Laterality: N/A;  . PUBOVAGINAL SLING N/A 10/03/2014   Procedure: Gaynelle Arabian;  Surgeon: Carolan Clines, MD;  [facility];  Service: Urology;  Laterality: N/A;  . REPLACEMENT TOTAL HIP W/  RESURFACING IMPLANTS Right   . TONSILLECTOMY    . TOTAL HIP ARTHROPLASTY Right 08/25/2012   Procedure: RIGHT TOTAL HIP ARTHROPLASTY ANTERIOR APPROACH;  Surgeon: [name], MD;  Location: [facility];  Service: Orthopedics;  Laterality: Right;    Social History   Socioeconomic History  . Marital status: Widowed    Spouse name: Not on file  . Number of children: 2  . Years of education: Not on file  . Highest education level: Associate degree: academic program  Occupational History  . Occupation: Radio broadcast assistant  Social Needs  . Financial resource strain: Not hard at all  . Food insecurity    Worry: Never true    Inability: Never true  . Transportation needs    Medical: No    Non-medical: No  Tobacco Use  . Smoking status: Current Every Day Smoker    Packs/day: 0.25    Years: 33.00    Pack years: 8.25    Types: Cigarettes  . Smokeless tobacco: Never Used  Substance and Sexual Activity  . Alcohol use: No  . Drug use: No  . Sexual activity: Not Currently  Lifestyle  . Physical activity    Days per week: 0 days    Minutes per session: 0 min  . Stress: Only a little  Relationships  . Social connections    Talks on phone: More than three times a week    Gets together: More than three times a week    Attends religious service: Never    Active member of club or organization: No    Attends meetings of clubs or organizations: Never    Relationship status: Widowed  . Intimate partner violence    Fear of current or ex partner: No    Emotionally abused: No    Physically abused: No    Forced sexual activity: No  Other Topics Concern  . Not on file  Social History Narrative  . Not on file     Outpatient Encounter Medications as of 08/26/2018  Medication Sig  . albuterol (PROAIR HFA) 108 (90 Base) MCG/ACT inhaler Inhale 2 puffs into the lungs every 6 (six) hours as needed for wheezing or shortness of breath. Pt needs for chronic asthma  . aspirin 81 MG tablet Take 1 tablet (81 mg total) by mouth daily. Resume 4 days post-op  . celecoxib (CELEBREX) 200 MG capsule TAKE 1 CAPSULE EVERY 12 HOURS  . cetirizine (ZYRTEC) 10 MG tablet TAKE 1 TABLET DAILY  . cholecalciferol (VITAMIN D) 1000 UNITS tablet Take 1,000 Units by mouth daily.  . cloNIDine (CATAPRES) 0.1 MG tablet Take 1 tablet (0.1 mg total) by mouth 2 (two) times daily.  . diclofenac sodium (VOLTAREN) 1 % GEL Apply topically 4 (four) times daily as needed.  . Fluticasone-Salmeterol (ADVAIR DISKUS) 250-50 MCG/DOSE AEPB USE 1 INHALATION EVERY 12 HOURS  . glimepiride (AMARYL) 4 MG tablet Take 1 tablet (4 mg total) by mouth 2 (two) times a day.  Marland Kitchen glucose blood (FREESTYLE LITE) test strip Test qd. DX: E11.9  . pantoprazole (PROTONIX) 40 MG tablet Take 1 tablet (40 mg total) by mouth daily.  Marland Kitchen telmisartan-hydrochlorothiazide (MICARDIS HCT) 80-25 MG tablet Take 1 tablet by mouth every morning.  . [DISCONTINUED] metFORMIN (GLUCOPHAGE) 500 MG tablet Take 1 tablet (500 mg total) by mouth 2 (two) times daily with a meal.  . [DISCONTINUED] PROAIR HFA 108 (90 Base) MCG/ACT inhaler USE 2 INHALATIONS EVERY 6 HOURS AS NEEDED FOR WHEEZING  . empagliflozin (JARDIANCE) 10 MG TABS tablet Take 10 mg by mouth daily before breakfast.  . VAGIFEM 10 MCG TABS vaginal tablet Place 1 tablet (10 mcg total) vaginally 2 (two) times a week. (Patient not taking: Reported on 08/26/2018)   Facility-Administered Encounter Medications as of 08/26/2018  Medication  . magnesium citrate solution 1 Bottle  . sodium phosphate (FLEET) 7-19 GM/118ML enema 1 enema    Allergies  Allergen Reactions  . Ivp Dye [Iodinated Diagnostic Agents] Anaphylaxis  . Shellfish  Allergy Anaphylaxis  . Vasotec [Enalapril] Shortness Of Breath  . Adhesive [  Tape] Other (See Comments)    blisters  . Amlodipine     swelling  . Iodine Other (See Comments)    Iodine that is applied to skin causes blisters   . Pyridium [Phenazopyridine Hcl]     blisters    Review of Systems  Constitutional: Negative for activity change, appetite change, chills, diaphoresis, fatigue, fever and unexpected weight change.  HENT: Negative.   Eyes: Negative.  Negative for photophobia and visual disturbance.  Respiratory: Positive for cough (baseline). Negative for chest tightness, shortness of breath and wheezing (baseline, intermittent ).   Cardiovascular: Negative for chest pain, palpitations and leg swelling.  Gastrointestinal: Negative for blood in stool, constipation, diarrhea, nausea and vomiting.  Endocrine: Negative.  Negative for cold intolerance, heat intolerance, polydipsia, polyphagia and polyuria.  Genitourinary: Negative for decreased urine volume, difficulty urinating, dysuria, frequency and urgency.  Musculoskeletal: Negative for arthralgias and myalgias.  Skin: Negative.   Allergic/Immunologic: Negative.   Neurological: Negative for dizziness, tremors, seizures, syncope, facial asymmetry, speech difficulty, weakness, light-headedness, numbness and headaches.  Hematological: Negative.   Psychiatric/Behavioral: Negative for confusion, hallucinations, sleep disturbance and suicidal ideas.  All other systems reviewed and are negative.       Objective:  BP 114/71   Pulse 86   Temp 99.5 F (37.5 C)   Ht '5\' 4"'$  (1.626 m)   Wt 188 lb (85.3 kg)   LMP 04/14/1982   BMI 32.27 kg/m    Wt Readings from Last 3 Encounters:  08/26/18 188 lb (85.3 kg)  08/20/18 190 lb 3.2 oz (86.3 kg)  06/15/18 193 lb (87.5 kg)    Physical Exam Vitals signs and nursing note reviewed.  Constitutional:      General: She is not in acute distress.    Appearance: Normal appearance. She is  well-developed and well-groomed. She is obese. She is not ill-appearing, toxic-appearing or diaphoretic.  HENT:     Head: Normocephalic and atraumatic.     Jaw: There is normal jaw occlusion.     Right Ear: Hearing normal.     Left Ear: Hearing normal.     Nose: Nose normal.     Mouth/Throat:     Lips: Pink.     Mouth: Mucous membranes are moist.     Pharynx: Oropharynx is clear. Uvula midline.  Eyes:     General: Lids are normal.     Extraocular Movements: Extraocular movements intact.     Conjunctiva/sclera: Conjunctivae normal.     Pupils: Pupils are equal, round, and reactive to light.  Neck:     Musculoskeletal: Normal range of motion and neck supple.     Thyroid: No thyroid mass, thyromegaly or thyroid tenderness.     Vascular: No carotid bruit or JVD.     Trachea: Trachea and phonation normal.  Cardiovascular:     Rate and Rhythm: Normal rate and regular rhythm.     Chest Wall: PMI is not displaced.     Pulses: Normal pulses.          Dorsalis pedis pulses are 2+ on the right side and 2+ on the left side.       Posterior tibial pulses are 2+ on the right side and 2+ on the left side.     Heart sounds: Normal heart sounds. No murmur. No friction rub. No gallop.   Pulmonary:     Effort: Pulmonary effort is normal. No respiratory distress.     Breath sounds: Normal breath sounds. No wheezing.  Abdominal:  General: Bowel sounds are normal. There is no distension or abdominal bruit.     Palpations: Abdomen is soft. There is no hepatomegaly or splenomegaly.     Tenderness: There is no abdominal tenderness. There is no right CVA tenderness or left CVA tenderness.     Hernia: No hernia is present.  Musculoskeletal: Normal range of motion.     Right lower leg: No edema.     Left lower leg: No edema.  Feet:     Right foot:     Skin integrity: Skin integrity normal.     Toenail Condition: Right toenails are normal.     Left foot:     Skin integrity: Skin integrity normal.      Toenail Condition: Left toenails are normal.  Lymphadenopathy:     Cervical: No cervical adenopathy.  Skin:    General: Skin is warm and dry.     Capillary Refill: Capillary refill takes less than 2 seconds.     Coloration: Skin is not cyanotic, jaundiced or pale.     Findings: No rash.  Neurological:     General: No focal deficit present.     Mental Status: She is alert and oriented to person, place, and time.     Cranial Nerves: Cranial nerves are intact.     Sensory: Sensation is intact.     Motor: Motor function is intact.     Coordination: Coordination is intact.     Gait: Gait is intact.     Deep Tendon Reflexes: Reflexes are normal and symmetric.  Psychiatric:        Attention and Perception: Attention and perception normal.        Mood and Affect: Mood and affect normal.        Speech: Speech normal.        Behavior: Behavior normal. Behavior is cooperative.        Thought Content: Thought content normal.        Cognition and Memory: Cognition and memory normal.        Judgment: Judgment normal.     Results for orders placed or performed in visit on 06/15/18  CMP14+EGFR  Result Value Ref Range   Glucose 181 (H) 65 - 99 mg/dL   BUN 13 8 - 27 mg/dL   Creatinine, Ser 0.69 0.57 - 1.00 mg/dL   GFR calc non Af Amer 88 >59 mL/min/1.73   GFR calc Af Amer 101 >59 mL/min/1.73   BUN/Creatinine Ratio 19 12 - 28   Sodium 136 134 - 144 mmol/L   Potassium 3.8 3.5 - 5.2 mmol/L   Chloride 96 96 - 106 mmol/L   CO2 25 20 - 29 mmol/L   Calcium 9.6 8.7 - 10.3 mg/dL   Total Protein 6.7 6.0 - 8.5 g/dL   Albumin 4.3 3.7 - 4.7 g/dL   Globulin, Total 2.4 1.5 - 4.5 g/dL   Albumin/Globulin Ratio 1.8 1.2 - 2.2   Bilirubin Total 0.6 0.0 - 1.2 mg/dL   Alkaline Phosphatase 71 39 - 117 IU/L   AST 46 (H) 0 - 40 IU/L   ALT 58 (H) 0 - 32 IU/L  Lipid panel  Result Value Ref Range   Cholesterol, Total 220 (H) 100 - 199 mg/dL   Triglycerides 387 (H) 0 - 149 mg/dL   HDL 31 (L) >39 mg/dL    VLDL Cholesterol Cal 77 (H) 5 - 40 mg/dL   LDL Calculated 112 (H) 0 - 99 mg/dL   Chol/HDL Ratio  7.1 (H) 0.0 - 4.4 ratio  Bayer DCA Hb A1c Waived  Result Value Ref Range   HB A1C (BAYER DCA - WAIVED) 7.8 (H) <7.0 %       Pertinent labs & imaging results that were available during my care of the patient were reviewed by me and considered in my medical decision making.  Assessment & Plan:  Coryn was seen today for medical management of chronic issues, hyperlipidemia, hypertension and diabetes.  Diagnoses and all orders for this visit:  Type 2 diabetes mellitus without complication, without long-term current use of insulin (HCC) Lab Results  Component Value Date   HGBA1C 7.8 (H) 06/15/2018   HGBA1C 6.6 01/23/2018   HGBA1C 6.6 10/10/2017     Diabetes Control:  Poorly controlled, A1C 7.8 today.  Instruction/counseling given: reminded to get eye exam, reminded to bring blood glucose meter & log to each visit, reminded to bring medications to each visit, discussed foot care, discussed the need for weight loss, discussed diet, discussed sick day management and provided printed educational material   1.  Rx changes: stopped metformin due to continued diarrhea and started Jardiance. Jardiance 10 mg samples with instructions on use provided.  2.  Education: Reviewed 'ABCs' of diabetes management (respective goals in parentheses):  A1C (<7), blood pressure (<130/80), BMI (<25), and cholesterol (LDL <100). 3.  Discussed pathophysiology of DM; difference between type 1 and type 2 DM. 4.  CHO counting diet discussed.  Reviewed CHO amount in various foods and how to read nutrition labels.  Discussed recommended serving sizes.  5.  Recommend check BG several times a week and record readings to bring to next appointment. Report persistent high or low readings. 6.  Recommended increase physical activity - goal is 150 minutes per week and advance as tolerated. 7.  Adequate sleep, at least 6-8 hours per  night.  8.  Smoking cessation.  9.  Follow up: 3 months  -     Bayer DCA Hb A1c Waived -     CBC with Differential/Platelet -     empagliflozin (JARDIANCE) 10 MG TABS tablet; Take 10 mg by mouth daily before breakfast.  Vitamin D deficiency Labs pending. Continue repletion therapy. If indicated, will change repletion dosage. Eat foods rich in Vit D including milk, orange juice, yogurt with vitamin D added, salmon or mackerel, canned tuna fish, cereals with vitamin D added, and cod liver oil. Get out in the sun but make sure to wear at least SPF 30 sunscreen.  -     CBC with Differential/Platelet -     VITAMIN D 25 Hydroxy (Vit-D Deficiency, Fractures)  Hypertension associated with type 2 diabetes mellitus (HCC) BP well controlled. Changes were not made in regimen today. Daily blood pressure log given with instructions on how to fill out and told to bring to next visit. Gaol BP 130/80. Pt aware to report any persistent high or low readings. DASH diet and exercise encouraged. Exercise at least 150 minutes per week and increase as tolerated. Goal BMI > 25. Stress management encouraged. Smoking cessation discussed. Avoid excessive alcohol. Avoid NSAID's. Avoid more than 2000 mg of sodium daily. Medications as prescribed. Follow up as scheduled.  -     CBC with Differential/Platelet -     CMP14+EGFR -     Lipid panel  Hyperlipidemia associated with type 2 diabetes mellitus ([place]) Diet encouraged - increase intake of fresh fruits and vegetables, increase intake of lean proteins. Bake, broil, or grill foods. Avoid  fried, greasy, and fatty foods. Avoid fast foods. Increase intake of fiber-rich whole grains.  Exercise encouraged - at least 150 minutes per week and advance as tolerated.  Goal BMI < 25.  Medications as prescribed. Follow up in 3-6 months as discussed.  -     Lipid panel  Mild intermittent chronic asthma without complication Well controlled. Needs Proair refilled, will send today.  Report any new or worsening symptoms.  -     albuterol (PROAIR HFA) 108 (90 Base) MCG/ACT inhaler; Inhale 2 puffs into the lungs every 6 (six) hours as needed for wheezing or shortness of breath. Pt needs for chronic asthma     Continue all other maintenance medications.  Follow up plan: Return in about 3 months (around 11/26/2018), or if symptoms worsen or fail to improve, for DM, HTN.  Continue healthy lifestyle choices, including diet (rich in fruits, vegetables, and lean proteins, and low in salt and simple carbohydrates) and exercise (at least 30 minutes of moderate physical activity daily).  Educational handout given for DM  The above assessment and management plan was discussed with the patient. The patient verbalized understanding of and has agreed to the management plan. Patient is aware to call the clinic if symptoms persist or worsen. Patient is aware when to return to the clinic for a follow-up visit. Patient educated on when it is appropriate to go to the emergency department.   Monia Pouch, FNP-C [facility] [phone] 08/26/18

## 2018-08-27 LAB — LIPID PANEL
Chol/HDL Ratio: 7.9 ratio — ABNORMAL HIGH (ref 0.0–4.4)
Cholesterol, Total: 238 mg/dL — ABNORMAL HIGH (ref 100–199)
HDL: 30 mg/dL — ABNORMAL LOW (ref >39)
LDL Calculated: 137 mg/dL — ABNORMAL HIGH (ref 0–99)
Triglycerides: 357 mg/dL — ABNORMAL HIGH (ref 0–149)
VLDL Cholesterol Cal: 71 mg/dL — ABNORMAL HIGH (ref 5–40)

## 2018-08-27 LAB — CBC WITH DIFFERENTIAL/PLATELET
Basophils Absolute: 0 10*3/uL (ref 0.0–0.2)
Basos: 0 %
EOS (ABSOLUTE): 0.1 10*3/uL (ref 0.0–0.4)
Eos: 1 %
Hematocrit: 42.4 % (ref 34.0–46.6)
Hemoglobin: 14.7 g/dL (ref 11.1–15.9)
Immature Grans (Abs): 0 10*3/uL (ref 0.0–0.1)
Immature Granulocytes: 0 %
Lymphocytes Absolute: 2.6 10*3/uL (ref 0.7–3.1)
Lymphs: 26 %
MCH: 30.9 pg (ref 26.6–33.0)
MCHC: 34.7 g/dL (ref 31.5–35.7)
MCV: 89 fL (ref 79–97)
Monocytes Absolute: 0.9 10*3/uL (ref 0.1–0.9)
Monocytes: 9 %
Neutrophils Absolute: 6.3 10*3/uL (ref 1.4–7.0)
Neutrophils: 64 %
Platelets: 170 10*3/uL (ref 150–450)
RBC: 4.76 x10E6/uL (ref 3.77–5.28)
RDW: 13.7 % (ref 11.7–15.4)
WBC: 10 10*3/uL (ref 3.4–10.8)

## 2018-08-27 LAB — CMP14+EGFR
ALT: 63 IU/L — ABNORMAL HIGH (ref 0–32)
AST: 58 IU/L — ABNORMAL HIGH (ref 0–40)
Albumin/Globulin Ratio: 1.8 (ref 1.2–2.2)
Albumin: 4.5 g/dL (ref 3.7–4.7)
Alkaline Phosphatase: 73 IU/L (ref 39–117)
BUN/Creatinine Ratio: 24 (ref 12–28)
BUN: 19 mg/dL (ref 8–27)
Bilirubin Total: 0.6 mg/dL (ref 0.0–1.2)
CO2: 25 mmol/L (ref 20–29)
Calcium: 10 mg/dL (ref 8.7–10.3)
Chloride: 96 mmol/L (ref 96–106)
Creatinine, Ser: 0.78 mg/dL (ref 0.57–1.00)
GFR calc Af Amer: 88 mL/min/{1.73_m2} (ref >59)
GFR calc non Af Amer: 77 mL/min/{1.73_m2} (ref >59)
Globulin, Total: 2.5 g/dL (ref 1.5–4.5)
Glucose: 171 mg/dL — ABNORMAL HIGH (ref 65–99)
Potassium: 4.7 mmol/L (ref 3.5–5.2)
Sodium: 138 mmol/L (ref 134–144)
Total Protein: 7 g/dL (ref 6.0–8.5)

## 2018-08-27 LAB — VITAMIN D 25 HYDROXY (VIT D DEFICIENCY, FRACTURES): Vit D, 25-Hydroxy: 26.8 ng/mL — ABNORMAL LOW (ref 30.0–100.0)

## 2018-08-27 MED ORDER — ATORVASTATIN CALCIUM 40 MG PO TABS: 40.0000 mg | ORAL_TABLET | Freq: Every day | ORAL | 3 refills | Status: DC

## 2018-08-27 NOTE — Addendum Note (Signed)
Addended by: Baruch Gouty on: 08/27/2018 10:04 AM   Modules accepted: Orders

## 2018-08-31 ENCOUNTER — Encounter: Payer: Self-pay | Admitting: Gastroenterology

## 2018-09-03 ENCOUNTER — Telehealth: Payer: Self-pay

## 2018-09-03 NOTE — Telephone Encounter (Signed)
Covid-19 screening questions   Do you now or have you had a fever in the last 14 days? NO   Do you have any respiratory symptoms of shortness of breath or cough now or in the last 14 days? NO  Do you have any family members or close contacts with diagnosed or suspected Covid-19 in the past 14 days? NO  Have you been tested for Covid-19 and found to be positive? NO        

## 2018-09-04 ENCOUNTER — Ambulatory Visit (AMBULATORY_SURGERY_CENTER): Payer: Medicare Other | Admitting: Gastroenterology

## 2018-09-04 ENCOUNTER — Encounter: Payer: Self-pay | Admitting: Gastroenterology

## 2018-09-04 ENCOUNTER — Other Ambulatory Visit: Payer: Self-pay

## 2018-09-04 VITALS — BP 105/62 | HR 101 | Temp 98.2°F | Resp 25 | Ht 64.0 in | Wt 188.0 lb

## 2018-09-04 DIAGNOSIS — D122 Benign neoplasm of ascending colon: Secondary | ICD-10-CM

## 2018-09-04 DIAGNOSIS — D120 Benign neoplasm of cecum: Secondary | ICD-10-CM

## 2018-09-04 DIAGNOSIS — K635 Polyp of colon: Secondary | ICD-10-CM

## 2018-09-04 DIAGNOSIS — D123 Benign neoplasm of transverse colon: Secondary | ICD-10-CM

## 2018-09-04 DIAGNOSIS — Z86010 Personal history of colonic polyps: Secondary | ICD-10-CM | POA: Diagnosis not present

## 2018-09-04 MED ORDER — SODIUM CHLORIDE 0.9 % IV SOLN: 500.0000 mL | Freq: Once | INTRAVENOUS | Status: DC

## 2018-09-04 NOTE — Progress Notes (Signed)
Pt's states no medical or surgical changes since previsit or office visit.  [place]

## 2018-09-04 NOTE — Progress Notes (Signed)
Called to room to assist during endoscopic procedure.  Patient ID and intended procedure confirmed with present staff. Received instructions for my participation in the procedure from the performing physician.  

## 2018-09-04 NOTE — Patient Instructions (Signed)
YOU HAD AN ENDOSCOPIC PROCEDURE TODAY AT [facility]:   Refer to the procedure report that was given to you for any specific questions about what was found during the examination.  If the procedure report does not answer your questions, please call your gastroenterologist to clarify.  If you requested that your care partner not be given the details of your procedure findings, then the procedure report has been included in a sealed envelope for you to review at your convenience later.  YOU SHOULD EXPECT: Some feelings of bloating in the abdomen. Passage of more gas than usual.  Walking can help get rid of the air that was put into your GI tract during the procedure and reduce the bloating. If you had a lower endoscopy (such as a colonoscopy or flexible sigmoidoscopy) you may notice spotting of blood in your stool or on the toilet paper. If you underwent a bowel prep for your procedure, you may not have a normal bowel movement for a few days.  Please Note:  You might notice some irritation and congestion in your nose or some drainage.  This is from the oxygen used during your procedure.  There is no need for concern and it should clear up in a day or so.  SYMPTOMS TO REPORT IMMEDIATELY:   Following lower endoscopy (colonoscopy or flexible sigmoidoscopy):  Excessive amounts of blood in the stool  Significant tenderness or worsening of abdominal pains  Swelling of the abdomen that is new, acute  Fever of 100F or higher    For urgent or emergent issues, a gastroenterologist can be reached at any hour by calling [phone].   DIET:  We do recommend a small meal at first, but then you may proceed to your regular diet.  Drink plenty of fluids but you should avoid alcoholic beverages for 24 hours.  ACTIVITY:  You should plan to take it easy for the rest of today and you should NOT DRIVE or use heavy machinery until tomorrow (because of the sedation medicines used during the test).     FOLLOW UP: Our staff will call the number listed on your records 48-72 hours following your procedure to check on you and address any questions or concerns that you may have regarding the information given to you following your procedure. If we do not reach you, we will leave a message.  We will attempt to reach you two times.  During this call, we will ask if you have developed any symptoms of COVID 19. If you develop any symptoms (ie: fever, flu-like symptoms, shortness of breath, cough etc.) before then, please call [phone].  If you test positive for Covid 19 in the 2 weeks post procedure, please call and report this information to [place].    If any biopsies were taken you will be contacted by phone or by letter within the next 1-3 weeks.  Please call us at [phone] if you have not heard about the biopsies in 3 weeks.    SIGNATURES/CONFIDENTIALITY: You and/or your care partner have signed paperwork which will be entered into your electronic medical record.  These signatures attest to the fact that that the information above on your After Visit Summary has been reviewed and is understood.  Full responsibility of the confidentiality of this discharge information lies with you and/or your care-partner.    Handouts were given to you on polyps colon clip card. Your blood sugar was 182 in the recovery room. NO ASPIRIN, ASPIRIN CONTAINING  PRODUCTS (BC OR GOODY POWDERS) OR NSAIDS (IBUPROFEN, ADVIL, ALEVE, AND MOTRIN) FOR 1 week; TYLENOL IS OK TO TAKE.  Okay to take Voltaren gel per Dr. Loletha Carrow. You may resume your current medications today. Await biopsy results. Please call if any questions or concerns.

## 2018-09-04 NOTE — Progress Notes (Signed)
To PACU, VSS. Report to [email or web address] 

## 2018-09-04 NOTE — Progress Notes (Signed)
Dr. Dr. Loletha Carrow pt to hold asa 81 mg and celebrex for 7 days and okay to continue taking Voltaren gel. Pt was informed. Maw   No problems noted in the recovery room. maw

## 2018-09-04 NOTE — Op Note (Signed)
[facility] Patient Name: Rita Payne Procedure Date: 09/04/2018 8:45 AM MRN: [id number] Endoscopist: Mallie Mussel L. Loletha Carrow , MD Age: 73 Referring MD:  Date of Birth: 11-09-1945 Gender: Female Account #: [id number] Procedure:                Colonoscopy Indications:              Surveillance: History of numerous (> 10) adenomas                            on last colonoscopy (08/2017) Medicines:                Monitored Anesthesia Care Procedure:                Pre-Anesthesia Assessment:                           - Prior to the procedure, a History and Physical                            was performed, and patient medications and                            allergies were reviewed. The patient's tolerance of                            previous anesthesia was also reviewed. The risks                            and benefits of the procedure and the sedation                            options and risks were discussed with the patient.                            All questions were answered, and informed consent                            was obtained. Prior Anticoagulants: The patient has                            taken no previous anticoagulant or antiplatelet                            agents except for aspirin. ASA Grade Assessment:                            III - A patient with severe systemic disease. After                            reviewing the risks and benefits, the patient was                            deemed in satisfactory condition to undergo the  procedure.                           After obtaining informed consent, the colonoscope                            was passed under direct vision. Throughout the                            procedure, the patient's blood pressure, pulse, and                            oxygen saturations were monitored continuously. The                            Colonoscope was introduced through the anus and                 advanced to the the cecum, identified by                            appendiceal orifice and ileocecal valve. The                            colonoscopy was somewhat difficult due to patient                            movement - restless leg. The patient tolerated the                            procedure well. The quality of the bowel                            preparation was good. The ileocecal valve,                            appendiceal orifice, and rectum were photographed. Scope In: 8:57:55 AM Scope Out: 9:38:13 AM Scope Withdrawal Time: 0 hours 34 minutes 30 seconds  Total Procedure Duration: 0 hours 40 minutes 18 seconds  Findings:                 The perianal and digital rectal examinations were                            normal.                           A 5 mm polyp was found in the cecum. The polyp was                            sessile. The polyp was removed with a cold snare.                            Resection and retrieval were complete.  A 22 mm polyp was found in the distal ascending                            colon. The polyp was flat. The polyp was removed                            with a piecemeal technique using a cold snare (and                            a cold biopsy forceps for several small residual                            edge pieces). Resection and retrieval were                            complete. To prevent bleeding post-intervention,                            two hemostatic clips were successfully placed (MR                            conditional). Area was tattooed with an injection                            of 0.5 mL of Spot (carbon black).                           A 12 mm polyp was found in the transverse colon.                            The polyp was flat. The polyp was removed with a                            piecemeal technique using a cold snare. Resection                            and retrieval were  complete.                           A 8 mm polyp was found in the sigmoid colon. The                            polyp was sessile. The polyp was removed with a                            cold snare. Resection and retrieval were complete.                           Retroflexion in the rectum was not performed due to                            anatomy.  The exam was otherwise without abnormality. Complications:            No immediate complications. Estimated Blood Loss:     Estimated blood loss was minimal. Impression:               - One 5 mm polyp in the cecum, removed with a cold                            snare. Resected and retrieved.                           - One 22 mm polyp in the distal ascending colon,                            removed piecemeal using a cold snare. Resected and                            retrieved. Clips (MR conditional) were placed.                            Tattooed.                           - One 12 mm polyp in the transverse colon, removed                            piecemeal using a cold snare. Resected and                            retrieved.                           - One 8 mm polyp in the sigmoid colon, removed with                            a cold snare. Resected and retrieved.                           - The examination was otherwise normal. Recommendation:           - Patient has a contact number available for                            emergencies. The signs and symptoms of potential                            delayed complications were discussed with the                            patient. Return to normal activities tomorrow.                            Written discharge instructions were provided to the  patient.                           - Resume previous diet.                           - No aspirin, ibuprofen, naproxen, or other                            non-steroidal anti-inflammatory  drugs for 1 week                            after polyp removal.                           - Await pathology results.                           - Repeat colonoscopy is recommended for                            surveillance. The colonoscopy date will be                            determined after pathology results from today's                            exam become available for review. Quince Santana L. Loletha Carrow, MD 09/04/2018 9:50:26 AM This report has been signed electronically.

## 2018-09-08 ENCOUNTER — Telehealth: Payer: Self-pay

## 2018-09-08 ENCOUNTER — Encounter: Payer: Self-pay | Admitting: Gastroenterology

## 2018-09-08 NOTE — Telephone Encounter (Signed)
No answer, left message to call back later today, B.Curlee Bogan RN. 

## 2018-09-08 NOTE — Telephone Encounter (Signed)
  Follow up Call-  Call back number 09/04/2018 09/01/2017  Post procedure Call Back phone  # [phone]  Permission to leave phone message Yes Yes  Some recent data might be hidden     Patient questions:  Do you have a fever, pain , or abdominal swelling? No. Pain Score  0 *  Have you tolerated food without any problems? Yes.    Have you been able to return to your normal activities? Yes.    Do you have any questions about your discharge instructions: Diet   No. Medications  No. Follow up visit  No.  Do you have questions or concerns about your Care? No.  Actions: * If pain score is 4 or above: 1. No action needed, pain <4.Have you developed a fever since your procedure? no  2.   Have you had an respiratory symptoms (SOB or cough) since your procedure? no  3.   Have you tested positive for COVID 19 since your procedure no  4.   Have you had any family members/close contacts diagnosed with the COVID 19 since your procedure?  no   If yes to any of these questions please route to Joylene John, RN and Alphonsa Gin, Therapist, sports.

## 2018-09-12 ENCOUNTER — Other Ambulatory Visit: Payer: Self-pay | Admitting: Nurse Practitioner

## 2018-09-12 DIAGNOSIS — J4520 Mild intermittent asthma, uncomplicated: Secondary | ICD-10-CM

## 2018-09-19 ENCOUNTER — Other Ambulatory Visit: Payer: Self-pay | Admitting: Nurse Practitioner

## 2018-09-25 ENCOUNTER — Telehealth: Payer: Self-pay | Admitting: Family Medicine

## 2018-09-25 ENCOUNTER — Other Ambulatory Visit: Payer: Self-pay | Admitting: Family Medicine

## 2018-09-25 DIAGNOSIS — I10 Essential (primary) hypertension: Secondary | ICD-10-CM

## 2018-09-25 MED ORDER — CLONIDINE HCL 0.1 MG PO TABS: 0.1000 mg | ORAL_TABLET | Freq: Two times a day (BID) | ORAL | 0 refills | Status: DC

## 2018-09-25 NOTE — Telephone Encounter (Signed)
Is she having side effects from the Jardiance? She had to stop metformin due to diarrhea. How is the medication not working?  I will send in her blood pressure medications.

## 2018-09-29 NOTE — Telephone Encounter (Signed)
Patient states that she is not having side effects. BS normal through out the day but in the morning fasting BS 180s-200s

## 2018-09-30 ENCOUNTER — Other Ambulatory Visit: Payer: Self-pay

## 2018-09-30 ENCOUNTER — Other Ambulatory Visit: Payer: Medicare Other

## 2018-09-30 DIAGNOSIS — E1169 Type 2 diabetes mellitus with other specified complication: Secondary | ICD-10-CM

## 2018-09-30 DIAGNOSIS — R748 Abnormal levels of other serum enzymes: Secondary | ICD-10-CM

## 2018-09-30 NOTE — Telephone Encounter (Signed)
She needs to give the medications time to work. It will take a few weeks to 3 months to determine if the medication is going to work. If A1C remains elevated at follow up, we will discuss changing therapy then.

## 2018-09-30 NOTE — Telephone Encounter (Signed)
Patient aware.

## 2018-11-02 ENCOUNTER — Other Ambulatory Visit: Payer: Self-pay | Admitting: Nurse Practitioner

## 2018-11-02 DIAGNOSIS — E119 Type 2 diabetes mellitus without complications: Secondary | ICD-10-CM

## 2018-11-06 ENCOUNTER — Other Ambulatory Visit: Payer: Self-pay | Admitting: Family Medicine

## 2018-11-06 DIAGNOSIS — E119 Type 2 diabetes mellitus without complications: Secondary | ICD-10-CM

## 2018-11-11 ENCOUNTER — Other Ambulatory Visit: Payer: Self-pay | Admitting: Nurse Practitioner

## 2018-11-11 DIAGNOSIS — I10 Essential (primary) hypertension: Secondary | ICD-10-CM

## 2018-11-27 ENCOUNTER — Other Ambulatory Visit: Payer: Self-pay

## 2018-11-27 ENCOUNTER — Ambulatory Visit: Payer: Medicare Other | Admitting: Family Medicine

## 2018-11-30 ENCOUNTER — Ambulatory Visit (INDEPENDENT_AMBULATORY_CARE_PROVIDER_SITE_OTHER): Payer: Medicare Other | Admitting: Family Medicine

## 2018-11-30 ENCOUNTER — Other Ambulatory Visit: Payer: Self-pay

## 2018-11-30 ENCOUNTER — Encounter: Payer: Self-pay | Admitting: Family Medicine

## 2018-11-30 VITALS — BP 98/55 | HR 87 | Temp 98.6°F | Resp 20 | Ht 64.0 in | Wt 183.0 lb

## 2018-11-30 DIAGNOSIS — E785 Hyperlipidemia, unspecified: Secondary | ICD-10-CM

## 2018-11-30 DIAGNOSIS — Z780 Asymptomatic menopausal state: Secondary | ICD-10-CM | POA: Insufficient documentation

## 2018-11-30 DIAGNOSIS — I10 Essential (primary) hypertension: Secondary | ICD-10-CM

## 2018-11-30 DIAGNOSIS — E119 Type 2 diabetes mellitus without complications: Secondary | ICD-10-CM | POA: Diagnosis not present

## 2018-11-30 DIAGNOSIS — E1159 Type 2 diabetes mellitus with other circulatory complications: Secondary | ICD-10-CM

## 2018-11-30 DIAGNOSIS — Z8619 Personal history of other infectious and parasitic diseases: Secondary | ICD-10-CM | POA: Diagnosis not present

## 2018-11-30 DIAGNOSIS — Z23 Encounter for immunization: Secondary | ICD-10-CM | POA: Diagnosis not present

## 2018-11-30 DIAGNOSIS — E669 Obesity, unspecified: Secondary | ICD-10-CM | POA: Diagnosis not present

## 2018-11-30 DIAGNOSIS — E1169 Type 2 diabetes mellitus with other specified complication: Secondary | ICD-10-CM | POA: Diagnosis not present

## 2018-11-30 LAB — BAYER DCA HB A1C WAIVED: HB A1C (BAYER DCA - WAIVED): 8.2 % — ABNORMAL HIGH (ref <7.0)

## 2018-11-30 MED ORDER — FLUCONAZOLE 150 MG PO TABS: ORAL_TABLET | ORAL | 0 refills | Status: DC

## 2018-11-30 MED ORDER — VAGIFEM 10 MCG VA TABS: 10.0000 ug | ORAL_TABLET | VAGINAL | 1 refills | Status: DC

## 2018-11-30 MED ORDER — FREESTYLE LITE TEST VI STRP: ORAL_STRIP | 3 refills | Status: DC

## 2018-11-30 MED ORDER — EMPAGLIFLOZIN 25 MG PO TABS: 25.0000 mg | ORAL_TABLET | Freq: Every day | ORAL | 1 refills | Status: AC

## 2018-11-30 NOTE — Progress Notes (Signed)
Subjective:  Patient ID: Rita Payne, female    DOB: 02-Aug-1945, 73 y.o.   MRN: [id number]  Patient Care Team: Baruch Gouty, FNP as PCP - General (Family Medicine) Paralee Cancel, MD as Consulting Physician (Orthopedic Surgery) Susa Day, MD as Consulting Physician (Orthopedic Surgery) Sandford Craze, MD as Referring Physician (Dermatology) Carolan Clines, MD (Inactive) as Consulting Physician (Urology)   Chief Complaint:  Medical Management of Chronic Issues (3 mo ), Diabetes, and Hyperlipidemia   HPI: Rita Payne is a 73 y.o. female presenting on 11/30/2018 for Medical Management of Chronic Issues (3 mo ), Diabetes, and Hyperlipidemia  1. Type 2 diabetes mellitus without complication, without long-term current use of insulin (HCC) Pt presents for follow up evaluation of Type 2 diabetes mellitus.  Current symptoms include hyperglycemia, polyuria and weight loss. Patient denies foot ulcerations, hypoglycemia , increased appetite, nausea, paresthesia of the feet, polydipsia, visual disturbances and vomiting.  Current diabetic medications include Jardiance, Amaryl Compliant with meds - Yes  Current monitoring regimen: home blood tests - daily Home blood sugar records: fasting range: 180 + Any episodes of hypoglycemia? no  Known diabetic complications: cardiovascular disease Cardiovascular risk factors: advanced age (older than 39 for men, 17 for women), diabetes mellitus, dyslipidemia, hypertension and obesity (BMI >= 30 kg/m2) Eye exam current (within one year): yes Podiatry yearly?  No Weight trend: fluctuating a bit Current diet: in general, a "healthy" diet   Current exercise: walking  PNA Vaccine UTD?  Yes Hep B Vaccine?  Yes Tdap Vaccine UTD?  No Urine microalbumin UTD? Yes  Is She on ACE inhibitor or angiotensin II receptor blocker?  Yes, Micardis Is She on statin? Yes atorvastatin Is She on ASA 81 mg daily?  Yes    2. Hypertension associated with  type 2 diabetes mellitus ([place]) Complaint with meds - Yes Current Medications - Micardis, clonidine  Checking BP at home ranging 115/70 Exercising Regularly - No Watching Salt intake - Yes Pertinent ROS:  Headache - No Fatigue - No Visual Disturbances - No Chest pain - No Dyspnea - No Palpitations - No LE edema - No They report good compliance with medications and can restate their regimen by memory. No medication side effects.  Family, social, and smoking history reviewed.   BP Readings from Last 3 Encounters:  11/30/18 (!) 98/55  09/04/18 105/62  08/26/18 114/71   CMP Latest Ref Rng & Units 08/26/2018 06/15/2018 01/23/2018  Glucose 65 - 99 mg/dL 171(H) 181(H) 130(H)  BUN 8 - 27 mg/dL _0 Creatinine 0.57 - 1.00 mg/dL 0.78 0.69 0.73  Sodium 134 - 144 mmol/L 138 136 139  Potassium 3.5 - 5.2 mmol/L 4.7 3.8 4.5  Chloride 96 - 106 mmol/L 96 96 97  CO2 20 - 29 mmol/L _1 Calcium 8.7 - 10.3 mg/dL 10.0 9.6 9.8  Total Protein 6.0 - 8.5 g/dL 7.0 6.7 6.7  Total Bilirubin 0.0 - 1.2 mg/dL 0.6 0.6 0.5  Alkaline Phos 39 - 117 IU/L 73 71 66  AST 0 - 40 IU/L 58(H) 46(H) 39  ALT 0 - 32 IU/L 63(H) 58(H) 48(H)      3. Hyperlipidemia associated with type 2 diabetes mellitus ([place]) Compliant with medications - Yes Current medications - atorvastatin Side effects from medications - No Diet - generally healthy Exercise - walkng  Lab Results  Component Value Date   CHOL 238 (H) 08/26/2018   HDL 30 (L) 08/26/2018   LDLCALC 137 (H) 08/26/2018  TRIG 357 (H) 08/26/2018   CHOLHDL 7.9 (H) 08/26/2018     Family and personal medical history reviewed. Smoking and ETOH history reviewed.    4. Obesity (BMI 30.0-34.9) Does try to watch diet. Has lost a few pounds in last 3 months.      Relevant past medical, surgical, family, and social history reviewed and updated as indicated.  Allergies and medications reviewed and updated. Date reviewed: Chart in Epic.   Past Medical  History:  Diagnosis Date  . Allergy   . Arthritis   . Basal cell carcinoma   . Cataract   . Diarrhea   . First degree heart block   . GERD (gastroesophageal reflux disease)   . History of kidney stones   . Hyperlipidemia   . Hypertension   . Leg fracture   . Melanoma ([place])   . Mild intermittent asthma   . SUI (stress urinary incontinence, female)   . Type 2 diabetes mellitus ([place])     Past Surgical History:  Procedure Laterality Date  . ABDOMINAL HYSTERECTOMY  1987  . BELPHAROPTOSIS REPAIR Bilateral 2009   9   . CHOLECYSTECTOMY  1989  . HERNIA REPAIR    . kidney tumor removal   1986  . KNEE ARTHROSCOPY Bilateral   . LUMBAR LAMINECTOMY/DECOMPRESSION MICRODISCECTOMY N/A 12/08/2013   Procedure: LUMBAR DECOMPRESSION L4-L5,  L3-L4 ;  Surgeon: Johnn Hai, MD;  Location: [facility];  Service: Orthopedics;  Laterality: N/A;  . PUBOVAGINAL SLING N/A 10/03/2014   Procedure: Gaynelle Arabian;  Surgeon: Carolan Clines, MD;  Location: [facility];  Service: Urology;  Laterality: N/A;  . REPLACEMENT TOTAL HIP W/  RESURFACING IMPLANTS Right   . TONSILLECTOMY    . TOTAL HIP ARTHROPLASTY Right 08/25/2012   Procedure: RIGHT TOTAL HIP ARTHROPLASTY ANTERIOR APPROACH;  Surgeon: [name], MD;  Location: [facility];  Service: Orthopedics;  Laterality: Right;    Social History   Socioeconomic History  . Marital status: Widowed    Spouse name: Not on file  . Number of children: 2  . Years of education: Not on file  . Highest education level: Associate degree: academic program  Occupational History  . Occupation: Radio broadcast assistant   Social Needs  . Financial resource strain: Not hard at all  . Food insecurity    Worry: Never true    Inability: Never true  . Transportation needs    Medical: No    Non-medical: No  Tobacco Use  . Smoking status: Current Every Day Smoker    Packs/day: 0.25    Years: 33.00    Pack years: 8.25    Types: Cigarettes  . Smokeless tobacco:  Never Used  Substance and Sexual Activity  . Alcohol use: No  . Drug use: No  . Sexual activity: Not Currently  Lifestyle  . Physical activity    Days per week: 0 days    Minutes per session: 0 min  . Stress: Only a little  Relationships  . Social connections    Talks on phone: More than three times a week    Gets together: More than three times a week    Attends religious service: Never    Active member of club or organization: No    Attends meetings of clubs or organizations: Never    Relationship status: Widowed  . Intimate partner violence    Fear of current or ex partner: No    Emotionally abused: No    Physically abused: No  Forced sexual activity: No  Other Topics Concern  . Not on file  Social History Narrative  . Not on file    Outpatient Encounter Medications as of 11/30/2018  Medication Sig  . albuterol (PROAIR HFA) 108 (90 Base) MCG/ACT inhaler Inhale 2 puffs into the lungs every 6 (six) hours as needed for wheezing or shortness of breath. Pt needs for chronic asthma  . aspirin 81 MG tablet Take 1 tablet (81 mg total) by mouth daily. Resume 4 days post-op  . atorvastatin (LIPITOR) 40 MG tablet Take 1 tablet (40 mg total) by mouth daily.  . celecoxib (CELEBREX) 200 MG capsule TAKE 1 CAPSULE EVERY 12 HOURS  . cetirizine (ZYRTEC) 10 MG tablet TAKE 1 TABLET DAILY  . cholecalciferol (VITAMIN D) 1000 UNITS tablet Take 1,000 Units by mouth daily.  . cloNIDine (CATAPRES) 0.1 MG tablet Take 1 tablet (0.1 mg total) by mouth 2 (two) times daily.  . diclofenac sodium (VOLTAREN) 1 % GEL Apply topically 4 (four) times daily as needed.  . Fluticasone-Salmeterol (ADVAIR DISKUS) 250-50 MCG/DOSE AEPB USE 1 INHALATION EVERY 12 HOURS  . glucose blood (FREESTYLE LITE) test strip Test qd. DX: E11.9  . MICARDIS HCT 80-25 MG tablet TAKE 1 TABLET EVERY MORNING  . pantoprazole (PROTONIX) 40 MG tablet Take 1 tablet (40 mg total) by mouth daily.  Marland Kitchen VAGIFEM 10 MCG TABS vaginal tablet  Place 1 tablet (10 mcg total) vaginally 2 (two) times a week.  . [DISCONTINUED] glimepiride (AMARYL) 4 MG tablet TAKE 1 TABLET TWICE A DAY  . [DISCONTINUED] glucose blood (FREESTYLE LITE) test strip Test qd. DX: E11.9  . [DISCONTINUED] JARDIANCE 10 MG TABS tablet TAKE 1 TABLET DAILY BEFORE BREAKFAST  . [DISCONTINUED] VAGIFEM 10 MCG TABS vaginal tablet Place 1 tablet (10 mcg total) vaginally 2 (two) times a week.  . empagliflozin (JARDIANCE) 25 MG TABS tablet Take 25 mg by mouth daily before breakfast.  . fluconazole (DIFLUCAN) 150 MG tablet 1 po q week x 4 weeks   Facility-Administered Encounter Medications as of 11/30/2018  Medication  . magnesium citrate solution 1 Bottle  . sodium phosphate (FLEET) 7-19 GM/118ML enema 1 enema    Allergies  Allergen Reactions  . Ivp Dye [Iodinated Diagnostic Agents] Anaphylaxis  . Shellfish Allergy Anaphylaxis  . Vasotec [Enalapril] Shortness Of Breath  . Adhesive [Tape] Other (See Comments)    blisters  . Amlodipine     swelling  . Iodine Other (See Comments)    Iodine that is applied to skin causes blisters   . Pyridium [Phenazopyridine Hcl]     blisters    Review of Systems  Constitutional: Negative for activity change, appetite change, chills, diaphoresis, fatigue, fever and unexpected weight change.  HENT: Negative.   Eyes: Negative.  Negative for photophobia and visual disturbance.  Respiratory: Negative for cough, chest tightness and shortness of breath.   Cardiovascular: Negative for chest pain, palpitations and leg swelling.  Gastrointestinal: Negative for abdominal pain, blood in stool, constipation, diarrhea, nausea and vomiting.  Endocrine: Positive for polyuria. Negative for cold intolerance, heat intolerance, polydipsia and polyphagia.  Genitourinary: Positive for frequency. Negative for decreased urine volume, difficulty urinating, dysuria, flank pain and urgency.  Musculoskeletal: Negative for arthralgias and myalgias.  Skin:  Negative.   Allergic/Immunologic: Negative.   Neurological: Negative for dizziness, tremors, seizures, syncope, facial asymmetry, speech difficulty, weakness, light-headedness, numbness and headaches.  Hematological: Negative.   Psychiatric/Behavioral: Negative for agitation, behavioral problems, confusion, decreased concentration, dysphoric mood, hallucinations, self-injury, sleep disturbance and  suicidal ideas. The patient is not nervous/anxious and is not hyperactive.   All other systems reviewed and are negative.       Objective:  BP (!) 98/55   Pulse 87   Temp 98.6 F (37 C)   Resp 20   Ht _0  (1.626 m)   Wt 183 lb (83 kg)   LMP 04/14/1982   SpO2 97%   BMI 31.41 kg/m    Wt Readings from Last 3 Encounters:  11/30/18 183 lb (83 kg)  09/04/18 188 lb (85.3 kg)  08/26/18 188 lb (85.3 kg)    Physical Exam Vitals signs and nursing note reviewed.  Constitutional:      General: She is not in acute distress.    Appearance: Normal appearance. She is well-developed and well-groomed. She is obese. She is not ill-appearing, toxic-appearing or diaphoretic.  HENT:     Head: Normocephalic and atraumatic.     Jaw: There is normal jaw occlusion.     Right Ear: Hearing normal.     Left Ear: Hearing normal.     Nose: Nose normal.     Mouth/Throat:     Lips: Pink.     Mouth: Mucous membranes are moist.     Pharynx: Oropharynx is clear. Uvula midline.  Eyes:     General: Lids are normal.     Extraocular Movements: Extraocular movements intact.     Conjunctiva/sclera: Conjunctivae normal.     Pupils: Pupils are equal, round, and reactive to light.  Neck:     Musculoskeletal: Normal range of motion and neck supple.     Thyroid: No thyroid mass, thyromegaly or thyroid tenderness.     Vascular: No carotid bruit or JVD.     Trachea: Trachea and phonation normal.  Cardiovascular:     Rate and Rhythm: Normal rate and regular rhythm.     Chest Wall: PMI is not displaced.     Pulses:  Normal pulses.     Heart sounds: Normal heart sounds. No murmur. No friction rub. No gallop.   Pulmonary:     Effort: Pulmonary effort is normal. No respiratory distress.     Breath sounds: Wheezing (mild, expiratory) present. No decreased breath sounds, rhonchi or rales.  Abdominal:     General: Bowel sounds are normal. There is no distension or abdominal bruit.     Palpations: Abdomen is soft. There is no hepatomegaly or splenomegaly.     Tenderness: There is no abdominal tenderness. There is no right CVA tenderness or left CVA tenderness.     Hernia: No hernia is present.  Musculoskeletal: Normal range of motion.     Right lower leg: No edema.     Left lower leg: No edema.  Lymphadenopathy:     Cervical: No cervical adenopathy.  Skin:    General: Skin is warm and dry.     Capillary Refill: Capillary refill takes less than 2 seconds.     Coloration: Skin is not cyanotic, jaundiced or pale.     Findings: No rash.  Neurological:     General: No focal deficit present.     Mental Status: She is alert and oriented to person, place, and time.     Cranial Nerves: Cranial nerves are intact. No cranial nerve deficit.     Sensory: Sensation is intact. No sensory deficit.     Motor: Motor function is intact. No weakness.     Coordination: Coordination is intact. Coordination normal.     Gait: Gait  is intact. Gait normal.     Deep Tendon Reflexes: Reflexes are normal and symmetric. Reflexes normal.  Psychiatric:        Attention and Perception: Attention and perception normal.        Mood and Affect: Mood and affect normal.        Speech: Speech normal.        Behavior: Behavior normal. Behavior is cooperative.        Thought Content: Thought content normal.        Cognition and Memory: Cognition and memory normal.        Judgment: Judgment normal.     Results for orders placed or performed in visit on 08/26/18  Bayer DCA Hb A1c Waived  Result Value Ref Range   HB A1C (BAYER DCA -  WAIVED) 7.8 (H) <7.0 %  CBC with Differential/Platelet  Result Value Ref Range   WBC 10.0 3.4 - 10.8 x10E3/uL   RBC 4.76 3.77 - 5.28 x10E6/uL   Hemoglobin 14.7 11.1 - 15.9 g/dL   Hematocrit 42.4 34.0 - 46.6 %   MCV 89 79 - 97 fL   MCH 30.9 26.6 - 33.0 pg   MCHC 34.7 31.5 - 35.7 g/dL   RDW 13.7 11.7 - 15.4 %   Platelets 170 150 - 450 x10E3/uL   Neutrophils 64 Not Estab. %   Lymphs 26 Not Estab. %   Monocytes 9 Not Estab. %   Eos 1 Not Estab. %   Basos 0 Not Estab. %   Neutrophils Absolute 6.3 1.4 - 7.0 x10E3/uL   Lymphocytes Absolute 2.6 0.7 - 3.1 x10E3/uL   Monocytes Absolute 0.9 0.1 - 0.9 x10E3/uL   EOS (ABSOLUTE) 0.1 0.0 - 0.4 x10E3/uL   Basophils Absolute 0.0 0.0 - 0.2 x10E3/uL   Immature Granulocytes 0 Not Estab. %   Immature Grans (Abs) 0.0 0.0 - 0.1 x10E3/uL  CMP14+EGFR  Result Value Ref Range   Glucose 171 (H) 65 - 99 mg/dL   BUN 19 8 - 27 mg/dL   Creatinine, Ser 0.78 0.57 - 1.00 mg/dL   GFR calc non Af Amer 77 >59 mL/min/1.73   GFR calc Af Amer 88 >59 mL/min/1.73   BUN/Creatinine Ratio 24 12 - 28   Sodium 138 134 - 144 mmol/L   Potassium 4.7 3.5 - 5.2 mmol/L   Chloride 96 96 - 106 mmol/L   CO2 25 20 - 29 mmol/L   Calcium 10.0 8.7 - 10.3 mg/dL   Total Protein 7.0 6.0 - 8.5 g/dL   Albumin 4.5 3.7 - 4.7 g/dL   Globulin, Total 2.5 1.5 - 4.5 g/dL   Albumin/Globulin Ratio 1.8 1.2 - 2.2   Bilirubin Total 0.6 0.0 - 1.2 mg/dL   Alkaline Phosphatase 73 39 - 117 IU/L   AST 58 (H) 0 - 40 IU/L   ALT 63 (H) 0 - 32 IU/L  Lipid panel  Result Value Ref Range   Cholesterol, Total 238 (H) 100 - 199 mg/dL   Triglycerides 357 (H) 0 - 149 mg/dL   HDL 30 (L) >39 mg/dL   VLDL Cholesterol Cal 71 (H) 5 - 40 mg/dL   LDL Calculated 137 (H) 0 - 99 mg/dL   Chol/HDL Ratio 7.9 (H) 0.0 - 4.4 ratio  VITAMIN D 25 Hydroxy (Vit-D Deficiency, Fractures)  Result Value Ref Range   Vit D, 25-Hydroxy 26.8 (L) 30.0 - 100.0 ng/mL       Pertinent labs & imaging results that were available  during my  care of the patient were reviewed by me and considered in my medical decision making.  Assessment & Plan:  Rita Payne was seen today for medical management of chronic issues, diabetes and hyperlipidemia.  Diagnoses and all orders for this visit:  Type 2 diabetes mellitus without complication, without long-term current use of insulin (HCC) A1C 8.2 today.  Will increase Jardiance to 25 mg daily. Diet and exercise discussed. Follow up in 3 months. Report any persistent high or low readings.  -     CBC with Differential/Platelet -     Bayer DCA Hb A1c Waived -     glucose blood (FREESTYLE LITE) test strip; Test qd. DX: E11.9 -     empagliflozin (JARDIANCE) 25 MG TABS tablet; Take 25 mg by mouth daily before breakfast.  Hypertension associated with type 2 diabetes mellitus ([place]) BP well controlled. Changes were not made in regimen. Goal BP is 130/80. Pt aware to report any persistent high or low readings. DASH diet and exercise encouraged. Exercise at least 150 minutes per week and increase as tolerated. Goal BMI > 25. Stress management encouraged. Avoid nicotine and tobacco product use. Avoid excessive alcohol and NSAID's. Avoid more than 2000 mg of sodium daily. Medications as prescribed. Follow up as scheduled.  -     CBC with Differential/Platelet -     CMP14+EGFR  Hyperlipidemia associated with type 2 diabetes mellitus ([place]) Diet encouraged - increase intake of fresh fruits and vegetables, increase intake of lean proteins. Bake, broil, or grill foods. Avoid fried, greasy, and fatty foods. Avoid fast foods. Increase intake of fiber-rich whole grains. Exercise encouraged - at least 150 minutes per week and advance as tolerated.  Goal BMI < 25. Continue medications as prescribed. Follow up in 3-6 months as discussed.  -     CBC with Differential/Platelet -     Lipid panel  Obesity (BMI 30.0-34.9) Diet and exercise encouraged.   Post-menopausal Doing well on Vagifem. No shortness of  breath, chest pain, calf swelling, palpitations, or dizziness. Will continue.  -     VAGIFEM 10 MCG TABS vaginal tablet; Place 1 tablet (10 mcg total) vaginally 2 (two) times a week.  History of candidal vulvovaginitis -     fluconazole (DIFLUCAN) 150 MG tablet; 1 po q week x 4 weeks  Influenza vaccine give today   Continue all other maintenance medications.  Follow up plan: Return in about 3 months (around 03/02/2019), or if symptoms worsen or fail to improve, for DM.  Continue healthy lifestyle choices, including diet (rich in fruits, vegetables, and lean proteins, and low in salt and simple carbohydrates) and exercise (at least 30 minutes of moderate physical activity daily).  Educational handout given for DM  The above assessment and management plan was discussed with the patient. The patient verbalized understanding of and has agreed to the management plan. Patient is aware to call the clinic if they develop any new symptoms or if symptoms persist or worsen. Patient is aware when to return to the clinic for a follow-up visit. Patient educated on when it is appropriate to go to the emergency department.   Monia Pouch, FNP-C [facility] [phone]

## 2018-11-30 NOTE — Patient Instructions (Signed)

## 2018-12-01 LAB — CMP14+EGFR
ALT: 49 IU/L — ABNORMAL HIGH (ref 0–32)
AST: 38 IU/L (ref 0–40)
Albumin/Globulin Ratio: 1.5 (ref 1.2–2.2)
Albumin: 4.3 g/dL (ref 3.7–4.7)
Alkaline Phosphatase: 88 IU/L (ref 39–117)
BUN/Creatinine Ratio: 25 (ref 12–28)
BUN: 21 mg/dL (ref 8–27)
Bilirubin Total: 0.4 mg/dL (ref 0.0–1.2)
CO2: 21 mmol/L (ref 20–29)
Calcium: 9.9 mg/dL (ref 8.7–10.3)
Chloride: 98 mmol/L (ref 96–106)
Creatinine, Ser: 0.84 mg/dL (ref 0.57–1.00)
GFR calc Af Amer: 81 mL/min/{1.73_m2} (ref >59)
GFR calc non Af Amer: 70 mL/min/{1.73_m2} (ref >59)
Globulin, Total: 2.8 g/dL (ref 1.5–4.5)
Glucose: 221 mg/dL — ABNORMAL HIGH (ref 65–99)
Potassium: 4.6 mmol/L (ref 3.5–5.2)
Sodium: 136 mmol/L (ref 134–144)
Total Protein: 7.1 g/dL (ref 6.0–8.5)

## 2018-12-01 LAB — CBC WITH DIFFERENTIAL/PLATELET
Basophils Absolute: 0.1 10*3/uL (ref 0.0–0.2)
Basos: 1 %
EOS (ABSOLUTE): 0.4 10*3/uL (ref 0.0–0.4)
Eos: 4 %
Hematocrit: 44 % (ref 34.0–46.6)
Hemoglobin: 15 g/dL (ref 11.1–15.9)
Immature Grans (Abs): 0 10*3/uL (ref 0.0–0.1)
Immature Granulocytes: 0 %
Lymphocytes Absolute: 2.8 10*3/uL (ref 0.7–3.1)
Lymphs: 25 %
MCH: 31.2 pg (ref 26.6–33.0)
MCHC: 34.1 g/dL (ref 31.5–35.7)
MCV: 92 fL (ref 79–97)
Monocytes Absolute: 1 10*3/uL — ABNORMAL HIGH (ref 0.1–0.9)
Monocytes: 9 %
Neutrophils Absolute: 6.7 10*3/uL (ref 1.4–7.0)
Neutrophils: 61 %
Platelets: 167 10*3/uL (ref 150–450)
RBC: 4.81 x10E6/uL (ref 3.77–5.28)
RDW: 14.3 % (ref 11.7–15.4)
WBC: 11 10*3/uL — ABNORMAL HIGH (ref 3.4–10.8)

## 2018-12-01 LAB — LIPID PANEL
Chol/HDL Ratio: 7.9 ratio — ABNORMAL HIGH (ref 0.0–4.4)
Cholesterol, Total: 237 mg/dL — ABNORMAL HIGH (ref 100–199)
HDL: 30 mg/dL — ABNORMAL LOW (ref >39)
LDL Chol Calc (NIH): 112 mg/dL — ABNORMAL HIGH (ref 0–99)
Triglycerides: 546 mg/dL — ABNORMAL HIGH (ref 0–149)
VLDL Cholesterol Cal: 95 mg/dL — ABNORMAL HIGH (ref 5–40)

## 2018-12-09 DIAGNOSIS — Z85828 Personal history of other malignant neoplasm of skin: Secondary | ICD-10-CM | POA: Diagnosis not present

## 2018-12-09 DIAGNOSIS — L570 Actinic keratosis: Secondary | ICD-10-CM | POA: Diagnosis not present

## 2018-12-09 DIAGNOSIS — L821 Other seborrheic keratosis: Secondary | ICD-10-CM | POA: Diagnosis not present

## 2019-01-26 ENCOUNTER — Other Ambulatory Visit: Payer: Self-pay | Admitting: *Deleted

## 2019-01-26 DIAGNOSIS — I10 Essential (primary) hypertension: Secondary | ICD-10-CM

## 2019-01-26 MED ORDER — CLONIDINE HCL 0.1 MG PO TABS: 0.1000 mg | ORAL_TABLET | Freq: Two times a day (BID) | ORAL | 0 refills | Status: DC

## 2019-01-29 ENCOUNTER — Other Ambulatory Visit: Payer: Medicare Other

## 2019-01-31 ENCOUNTER — Other Ambulatory Visit: Payer: Self-pay | Admitting: Nurse Practitioner

## 2019-01-31 DIAGNOSIS — E119 Type 2 diabetes mellitus without complications: Secondary | ICD-10-CM

## 2019-02-18 DIAGNOSIS — Z23 Encounter for immunization: Secondary | ICD-10-CM | POA: Diagnosis not present

## 2019-02-23 ENCOUNTER — Encounter: Payer: Self-pay | Admitting: Family

## 2019-02-23 ENCOUNTER — Other Ambulatory Visit: Payer: Self-pay

## 2019-02-23 ENCOUNTER — Ambulatory Visit (INDEPENDENT_AMBULATORY_CARE_PROVIDER_SITE_OTHER): Payer: Medicare Other | Admitting: Family

## 2019-02-23 VITALS — BP 145/79 | HR 79 | Temp 97.5°F | Ht 64.0 in | Wt 184.8 lb

## 2019-02-23 DIAGNOSIS — M546 Pain in thoracic spine: Secondary | ICD-10-CM | POA: Diagnosis not present

## 2019-02-23 MED ORDER — DICLOFENAC SODIUM 1 % EX GEL: 2.0000 g | Freq: Four times a day (QID) | CUTANEOUS | 2 refills | Status: DC

## 2019-02-23 MED ORDER — KETOROLAC TROMETHAMINE 60 MG/2ML IM SOLN
60.0000 mg | Freq: Once | INTRAMUSCULAR | Status: AC
  Administered 2019-02-23: 60 mg via INTRAMUSCULAR

## 2019-02-23 MED ORDER — BACLOFEN 10 MG PO TABS: 10.0000 mg | ORAL_TABLET | Freq: Three times a day (TID) | ORAL | 0 refills | Status: DC

## 2019-02-23 NOTE — Patient Instructions (Signed)
Acute Back Pain, Adult Acute back pain is sudden and usually short-lived. It is often caused by an injury to the muscles and tissues in the back. The injury may result from:  A muscle or ligament getting overstretched or torn (strained). Ligaments are tissues that connect bones to each other. Lifting something improperly can cause a back strain.  Wear and tear (degeneration) of the spinal disks. Spinal disks are circular tissue that provides cushioning between the bones of the spine (vertebrae).  Twisting motions, such as while playing sports or doing yard work.  A hit to the back.  Arthritis. You may have a physical exam, lab tests, and imaging tests to find the cause of your pain. Acute back pain usually goes away with rest and home care. Follow these instructions at home: Managing pain, stiffness, and swelling  Take over-the-counter and prescription medicines only as told by your health care provider.  Your health care provider may recommend applying ice during the first 24-48 hours after your pain starts. To do this: ? Put ice in a plastic bag. ? Place a towel between your skin and the bag. ? Leave the ice on for 20 minutes, 2-3 times a day.  If directed, apply heat to the affected area as often as told by your health care provider. Use the heat source that your health care provider recommends, such as a moist heat pack or a heating pad. ? Place a towel between your skin and the heat source. ? Leave the heat on for 20-30 minutes. ? Remove the heat if your skin turns bright red. This is especially important if you are unable to feel pain, heat, or cold. You have a greater risk of getting burned. Activity   Do not stay in bed. Staying in bed for more than 1-2 days can delay your recovery.  Sit up and stand up straight. Avoid leaning forward when you sit, or hunching over when you stand. ? If you work at a desk, sit close to it so you do not need to lean over. Keep your chin tucked  in. Keep your neck drawn back, and keep your elbows bent at a right angle. Your arms should look like the letter "L." ? Sit high and close to the steering wheel when you drive. Add lower back (lumbar) support to your car seat, if needed.  Take short walks on even surfaces as soon as you are able. Try to increase the length of time you walk each day.  Do not sit, drive, or stand in one place for more than 30 minutes at a time. Sitting or standing for long periods of time can put stress on your back.  Do not drive or use heavy machinery while taking prescription pain medicine.  Use proper lifting techniques. When you bend and lift, use positions that put less stress on your back: ? Bend your knees. ? Keep the load close to your body. ? Avoid twisting.  Exercise regularly as told by your health care provider. Exercising helps your back heal faster and helps prevent back injuries by keeping muscles strong and flexible.  Work with a physical therapist to make a safe exercise program, as recommended by your health care provider. Do any exercises as told by your physical therapist. Lifestyle  Maintain a healthy weight. Extra weight puts stress on your back and makes it difficult to have good posture.  Avoid activities or situations that make you feel anxious or stressed. Stress and anxiety increase muscle   tension and can make back pain worse. Learn ways to manage anxiety and stress, such as through exercise. General instructions  Sleep on a firm mattress in a comfortable position. Try lying on your side with your knees slightly bent. If you lie on your back, put a pillow under your knees.  Follow your treatment plan as told by your health care provider. This may include: ? Cognitive or behavioral therapy. ? Acupuncture or massage therapy. ? Meditation or yoga. Contact a health care provider if:  You have pain that is not relieved with rest or medicine.  You have increasing pain going down  into your legs or buttocks.  Your pain does not improve after 2 weeks.  You have pain at night.  You lose weight without trying.  You have a fever or chills. Get help right away if:  You develop new bowel or bladder control problems.  You have unusual weakness or numbness in your arms or legs.  You develop nausea or vomiting.  You develop abdominal pain.  You feel faint. Summary  Acute back pain is sudden and usually short-lived.  Use proper lifting techniques. When you bend and lift, use positions that put less stress on your back.  Take over-the-counter and prescription medicines and apply heat or ice as directed by your health care provider. This information is not intended to replace advice given to you by your health care provider. Make sure you discuss any questions you have with your health care provider. Document Revised: 04/14/2018 Document Reviewed: 08/07/2016 Elsevier Patient Education  2020 [employer]  

## 2019-02-23 NOTE — Progress Notes (Signed)
Subjective:    Patient ID: Rita Payne, female    DOB: 03/21/1945, 74 y.o.   MRN: [id number]  Chief Complaint  Patient presents with  . Back Pain    Upper back ice used ice and heat and lidocain patches    Pt presents to the office today for acute back pain that started after she carried a pack of waters into her home.  Back Pain This is a new problem. The current episode started 1 to 4 weeks ago. The problem occurs constantly. The problem has been gradually worsening since onset. The pain is present in the thoracic spine. The pain does not radiate. The pain is at a severity of 8/10. The pain is moderate. The symptoms are aggravated by twisting and position. Associated symptoms include numbness and weakness. Pertinent negatives include no bladder incontinence, bowel incontinence or chest pain. (Right arm pain) She has tried bed rest, heat and ice for the symptoms. The treatment provided mild relief.      Review of Systems  Cardiovascular: Negative for chest pain.  Gastrointestinal: Negative for bowel incontinence.  Genitourinary: Negative for bladder incontinence.  Musculoskeletal: Positive for back pain.  Neurological: Positive for weakness and numbness.  All other systems reviewed and are negative.      Objective:   Physical Exam Vitals reviewed.  Constitutional:      General: She is not in acute distress.    Appearance: She is well-developed.  HENT:     Head: Normocephalic and atraumatic.  Eyes:     Pupils: Pupils are equal, round, and reactive to light.  Neck:     Thyroid: No thyromegaly.  Cardiovascular:     Rate and Rhythm: Normal rate and regular rhythm.     Heart sounds: Normal heart sounds. No murmur.  Pulmonary:     Effort: Pulmonary effort is normal. No respiratory distress.     Breath sounds: Normal breath sounds. No wheezing.  Abdominal:     General: Bowel sounds are normal. There is no distension.     Palpations: Abdomen is soft.     Tenderness: There is  no abdominal tenderness.  Musculoskeletal:        General: No tenderness. Normal range of motion.       Arms:     Cervical back: Normal range of motion and neck supple.     Comments: Pain with palpation and flexion of thoracic back  Skin:    General: Skin is warm and dry.  Neurological:     Mental Status: She is alert and oriented to person, place, and time.     Cranial Nerves: No cranial nerve deficit.     Deep Tendon Reflexes: Reflexes are normal and symmetric.  Psychiatric:        Behavior: Behavior normal.        Thought Content: Thought content normal.        Judgment: Judgment normal.      BP (!) 145/79   Pulse 79   Temp (!) 97.5 F (36.4 C) (Temporal)   Ht 5\' 4"  (1.626 m)   Wt 184 lb 12.8 oz (83.8 kg)   LMP 04/14/1982   BMI 31.72 kg/m       Assessment & Plan:  Nathania Gura comes in today with chief complaint of Back Pain (Upper back ice used ice and heat and lidocain patches )   Diagnosis and orders addressed:  1. Acute right-sided thoracic back pain Rest Ice  ROM exercises encouraged Sedation precautions discussed  No other NSAID' s while taking Celebrex Strict low carb diet and keep follow up with PCP. Will hold off on steroids at this time given elevated glucose.  RTO if symptoms worsen or do not improve - ketorolac (TORADOL) injection 60 mg - baclofen (LIORESAL) 10 MG tablet; Take 1 tablet (10 mg total) by mouth 3 (three) times daily.  Dispense: 30 each; Refill: 0 - diclofenac Sodium (VOLTAREN) 1 % GEL; Apply 2 g topically 4 (four) times daily.  Dispense: 100 g; Refill: 2   Evelina Dun, FNP

## 2019-02-25 ENCOUNTER — Other Ambulatory Visit: Payer: Self-pay | Admitting: Nurse Practitioner

## 2019-02-25 NOTE — Telephone Encounter (Signed)
Visit 02/23/19, given Baclofen and Voltaren gel for back pain.

## 2019-03-05 ENCOUNTER — Ambulatory Visit (INDEPENDENT_AMBULATORY_CARE_PROVIDER_SITE_OTHER): Payer: Medicare Other | Admitting: Family Medicine

## 2019-03-05 ENCOUNTER — Other Ambulatory Visit: Payer: Self-pay

## 2019-03-05 ENCOUNTER — Encounter: Payer: Self-pay | Admitting: Family Medicine

## 2019-03-05 VITALS — BP 100/63 | HR 89 | Temp 96.6°F | Resp 20 | Ht 64.0 in | Wt 182.0 lb

## 2019-03-05 DIAGNOSIS — E559 Vitamin D deficiency, unspecified: Secondary | ICD-10-CM | POA: Diagnosis not present

## 2019-03-05 DIAGNOSIS — E1159 Type 2 diabetes mellitus with other circulatory complications: Secondary | ICD-10-CM

## 2019-03-05 DIAGNOSIS — E785 Hyperlipidemia, unspecified: Secondary | ICD-10-CM | POA: Diagnosis not present

## 2019-03-05 DIAGNOSIS — I10 Essential (primary) hypertension: Secondary | ICD-10-CM | POA: Diagnosis not present

## 2019-03-05 DIAGNOSIS — E119 Type 2 diabetes mellitus without complications: Secondary | ICD-10-CM | POA: Diagnosis not present

## 2019-03-05 DIAGNOSIS — E1169 Type 2 diabetes mellitus with other specified complication: Secondary | ICD-10-CM

## 2019-03-05 DIAGNOSIS — Z780 Asymptomatic menopausal state: Secondary | ICD-10-CM

## 2019-03-05 DIAGNOSIS — I152 Hypertension secondary to endocrine disorders: Secondary | ICD-10-CM

## 2019-03-05 DIAGNOSIS — M546 Pain in thoracic spine: Secondary | ICD-10-CM | POA: Diagnosis not present

## 2019-03-05 DIAGNOSIS — Z1231 Encounter for screening mammogram for malignant neoplasm of breast: Secondary | ICD-10-CM | POA: Diagnosis not present

## 2019-03-05 LAB — BAYER DCA HB A1C WAIVED: HB A1C (BAYER DCA - WAIVED): 8.2 % — ABNORMAL HIGH (ref <7.0)

## 2019-03-05 MED ORDER — TRULICITY 0.75 MG/0.5ML ~~LOC~~ SOAJ: 0.7500 mg | SUBCUTANEOUS | 6 refills | Status: DC

## 2019-03-05 MED ORDER — ESTRADIOL 10 MCG VA TABS: 10.0000 ug | ORAL_TABLET | VAGINAL | 1 refills | Status: DC

## 2019-03-05 MED ORDER — PREDNISONE 20 MG PO TABS: ORAL_TABLET | ORAL | 0 refills | Status: DC

## 2019-03-05 MED ORDER — EMPAGLIFLOZIN 25 MG PO TABS: 25.0000 mg | ORAL_TABLET | Freq: Every day | ORAL | 1 refills | Status: DC

## 2019-03-05 MED ORDER — GLIMEPIRIDE 4 MG PO TABS: 4.0000 mg | ORAL_TABLET | Freq: Two times a day (BID) | ORAL | 1 refills | Status: DC

## 2019-03-05 NOTE — Progress Notes (Signed)
Subjective:  Patient ID: Rita Payne, female    DOB: May 28, 1945, 74 y.o.   MRN: [id number]  Patient Care Team: Baruch Gouty, FNP as PCP - General (Family Medicine) Paralee Cancel, MD as Consulting Physician (Orthopedic Surgery) Susa Day, MD as Consulting Physician (Orthopedic Surgery) Sandford Craze, MD as Referring Physician (Dermatology) Carolan Clines, MD (Inactive) as Consulting Physician (Urology)   Chief Complaint:  Medical Management of Chronic Issues (3 mo ), Diabetes, Hyperlipidemia, and Hypertension   HPI: Rita Payne is a 74 y.o. female presenting on 03/05/2019 for Medical Management of Chronic Issues (3 mo ), Diabetes, Hyperlipidemia, and Hypertension   1. Type 2 diabetes mellitus without complication, without long-term current use of insulin (HCC) Has been taking Amaryl 4 mg twice daily and jardiance 25 mg daily. Has blood sugar readings in the mid to high 100 range and some low 200 readings. States she has felt more fatigued over the last several weeks. She has been avoiding foods high in sugars and trying to eat healthy. No polydipsia or polyphagia, does have polyuria.   2. Hypertension associated with type 2 diabetes mellitus ([place]) Well controlled on current regimen. No visual changes, headaches, chest pain, shortness of breath, leg swelling, dizziness, or confusion.   3. Hyperlipidemia associated with type 2 diabetes mellitus ([place]) Has been taking medications as prescribed without adverse side effects. Does try to watch diet but has not been as strict over the last several weeks.   4. Vitamin D deficiency Pt is taking oral repletion therapy. Denies bone pain and tenderness, muscle weakness, fracture, and difficulty walking.   5. Back pain Pt reports ongoing symptoms, right upper back and shoulder. States she has not been able to take the muscle relaxer due to drowsiness. States no new injury. Lidocaine patch was beneficial.   Relevant past medical,  surgical, family, and social history reviewed and updated as indicated.  Allergies and medications reviewed and updated. Date reviewed: Chart in Epic.   Past Medical History:  Diagnosis Date  . Allergy   . Arthritis   . Basal cell carcinoma   . Cataract   . Diarrhea   . First degree heart block   . GERD (gastroesophageal reflux disease)   . History of kidney stones   . Hyperlipidemia   . Hypertension   . Leg fracture   . Melanoma ([place])   . Mild intermittent asthma   . SUI (stress urinary incontinence, female)   . Type 2 diabetes mellitus ([place])     Past Surgical History:  Procedure Laterality Date  . ABDOMINAL HYSTERECTOMY  1987  . BELPHAROPTOSIS REPAIR Bilateral 2009   9   . CHOLECYSTECTOMY  1989  . HERNIA REPAIR    . kidney tumor removal   1986  . KNEE ARTHROSCOPY Bilateral   . LUMBAR LAMINECTOMY/DECOMPRESSION MICRODISCECTOMY N/A 12/08/2013   Procedure: LUMBAR DECOMPRESSION L4-L5,  L3-L4 ;  Surgeon: Johnn Hai, MD;  Location: [facility];  Service: Orthopedics;  Laterality: N/A;  . PUBOVAGINAL SLING N/A 10/03/2014   Procedure: Gaynelle Arabian;  Surgeon: Carolan Clines, MD;  Location: [facility];  Service: Urology;  Laterality: N/A;  . REPLACEMENT TOTAL HIP W/  RESURFACING IMPLANTS Right   . TONSILLECTOMY    . TOTAL HIP ARTHROPLASTY Right 08/25/2012   Procedure: RIGHT TOTAL HIP ARTHROPLASTY ANTERIOR APPROACH;  Surgeon: [name], MD;  Location: [facility];  Service: Orthopedics;  Laterality: Right;    Social History   Socioeconomic History  .  Marital status: Widowed    Spouse name: Not on file  . Number of children: 2  . Years of education: Not on file  . Highest education level: Associate degree: academic program  Occupational History  . Occupation: Paralegal   Tobacco Use  . Smoking status: Current Every Day Smoker    Packs/day: 0.25    Years: 33.00    Pack years: 8.25    Types: Cigarettes  . Smokeless tobacco: Never Used  Substance  and Sexual Activity  . Alcohol use: No  . Drug use: No  . Sexual activity: Not Currently  Other Topics Concern  . Not on file  Social History Narrative  . Not on file   Social Determinants of Health   Financial Resource Strain: Low Risk   . Difficulty of Paying Living Expenses: Not hard at all  Food Insecurity: No Food Insecurity  . Worried About Charity fundraiser in the Last Year: Never true  . Ran Out of Food in the Last Year: Never true  Transportation Needs: No Transportation Needs  . Lack of Transportation (Medical): No  . Lack of Transportation (Non-Medical): No  Physical Activity: Inactive  . Days of Exercise per Week: 0 days  . Minutes of Exercise per Session: 0 min  Stress: No Stress Concern Present  . Feeling of Stress : Only a little  Social Connections: Moderately Isolated  . Frequency of Communication with Friends and Family: More than three times a week  . Frequency of Social Gatherings with Friends and Family: More than three times a week  . Attends Religious Services: Never  . Active Member of Clubs or Organizations: No  . Attends Archivist Meetings: Never  . Marital Status: Widowed  Intimate Partner Violence: Not At Risk  . Fear of Current or Ex-Partner: No  . Emotionally Abused: No  . Physically Abused: No  . Sexually Abused: No    Outpatient Encounter Medications as of 03/05/2019  Medication Sig  . albuterol (PROAIR HFA) 108 (90 Base) MCG/ACT inhaler Inhale 2 puffs into the lungs every 6 (six) hours as needed for wheezing or shortness of breath. Pt needs for chronic asthma  . aspirin 81 MG tablet Take 1 tablet (81 mg total) by mouth daily. Resume 4 days post-op  . atorvastatin (LIPITOR) 40 MG tablet Take 1 tablet (40 mg total) by mouth daily.  . baclofen (LIORESAL) 10 MG tablet Take 1 tablet (10 mg total) by mouth 3 (three) times daily.  . celecoxib (CELEBREX) 200 MG capsule TAKE 1 CAPSULE EVERY 12 HOURS  . cetirizine (ZYRTEC) 10 MG tablet  TAKE 1 TABLET DAILY  . cholecalciferol (VITAMIN D) 1000 UNITS tablet Take 1,000 Units by mouth daily.  . diclofenac Sodium (VOLTAREN) 1 % GEL Apply 2 g topically 4 (four) times daily.  Derrill Memo ON 03/08/2019] Estradiol (VAGIFEM) 10 MCG TABS vaginal tablet Place 1 tablet (10 mcg total) vaginally 2 (two) times a week.  . fluconazole (DIFLUCAN) 150 MG tablet 1 po q week x 4 weeks  . Fluticasone-Salmeterol (ADVAIR DISKUS) 250-50 MCG/DOSE AEPB USE 1 INHALATION EVERY 12 HOURS  . glucose blood (FREESTYLE LITE) test strip Test qd. DX: E11.9  . MICARDIS HCT 80-25 MG tablet TAKE 1 TABLET EVERY MORNING  . pantoprazole (PROTONIX) 40 MG tablet Take 1 tablet (40 mg total) by mouth daily.  . [DISCONTINUED] VAGIFEM 10 MCG TABS vaginal tablet Place 1 tablet (10 mcg total) vaginally 2 (two) times a week.  . cloNIDine (CATAPRES) 0.1  MG tablet Take 1 tablet (0.1 mg total) by mouth 2 (two) times daily.  . Dulaglutide (TRULICITY) 8.03 OZ/2.2QM SOPN Inject 0.75 mg into the skin once a week.  . empagliflozin (JARDIANCE) 25 MG TABS tablet Take 25 mg by mouth daily before breakfast.  . glimepiride (AMARYL) 4 MG tablet Take 1 tablet (4 mg total) by mouth 2 (two) times daily.  . predniSONE (DELTASONE) 20 MG tablet 2 po at sametime daily for 5 days  . [DISCONTINUED] glimepiride (AMARYL) 4 MG tablet TAKE 1 TABLET TWICE A DAY   Facility-Administered Encounter Medications as of 03/05/2019  Medication  . magnesium citrate solution 1 Bottle  . sodium phosphate (FLEET) 7-19 GM/118ML enema 1 enema    Allergies  Allergen Reactions  . Ivp Dye [Iodinated Diagnostic Agents] Anaphylaxis  . Shellfish Allergy Anaphylaxis  . Vasotec [Enalapril] Shortness Of Breath  . Adhesive [Tape] Other (See Comments)    blisters  . Amlodipine     swelling  . Iodine Other (See Comments)    Iodine that is applied to skin causes blisters   . Pyridium [Phenazopyridine Hcl]     blisters    Review of Systems  Constitutional: Negative for  activity change, appetite change, chills, diaphoresis, fatigue, fever and unexpected weight change.  HENT: Negative.   Eyes: Negative.  Negative for photophobia and visual disturbance.  Respiratory: Negative for cough, chest tightness and shortness of breath.   Cardiovascular: Negative for chest pain, palpitations and leg swelling.  Gastrointestinal: Negative for abdominal pain, blood in stool, constipation, diarrhea, nausea and vomiting.  Endocrine: Positive for polyuria. Negative for cold intolerance, heat intolerance, polydipsia and polyphagia.  Genitourinary: Negative for decreased urine volume, difficulty urinating, dysuria, frequency and urgency.  Musculoskeletal: Positive for arthralgias and myalgias.  Skin: Negative.   Allergic/Immunologic: Negative.   Neurological: Negative for dizziness, tremors, seizures, syncope, facial asymmetry, speech difficulty, weakness, light-headedness, numbness and headaches.  Hematological: Negative.   Psychiatric/Behavioral: Negative for confusion, hallucinations, sleep disturbance and suicidal ideas.  All other systems reviewed and are negative.       Objective:  BP 100/63   Pulse 89   Temp (!) 96.6 F (35.9 C)   Resp 20   Ht '5\' 4"'$  (1.626 m)   Wt 182 lb (82.6 kg)   LMP 04/14/1982   SpO2 99%   BMI 31.24 kg/m    Wt Readings from Last 3 Encounters:  03/05/19 182 lb (82.6 kg)  02/23/19 184 lb 12.8 oz (83.8 kg)  11/30/18 183 lb (83 kg)    Physical Exam Vitals and nursing note reviewed.  Constitutional:      General: She is not in acute distress.    Appearance: Normal appearance. She is well-developed and well-groomed. She is not ill-appearing, toxic-appearing or diaphoretic.  HENT:     Head: Normocephalic and atraumatic.     Jaw: There is normal jaw occlusion.     Right Ear: Hearing normal.     Left Ear: Hearing normal.     Nose: Nose normal.     Mouth/Throat:     Lips: Pink.     Mouth: Mucous membranes are moist.     Pharynx:  Oropharynx is clear. Uvula midline.  Eyes:     General: Lids are normal.     Extraocular Movements: Extraocular movements intact.     Conjunctiva/sclera: Conjunctivae normal.     Pupils: Pupils are equal, round, and reactive to light.  Neck:     Thyroid: No thyroid mass, thyromegaly or thyroid  tenderness.     Vascular: No carotid bruit or JVD.     Trachea: Trachea and phonation normal.  Cardiovascular:     Rate and Rhythm: Normal rate and regular rhythm.     Chest Wall: PMI is not displaced.     Pulses: Normal pulses.     Heart sounds: Normal heart sounds. No murmur. No friction rub. No gallop.   Pulmonary:     Effort: Pulmonary effort is normal. No respiratory distress.     Breath sounds: Normal breath sounds. No wheezing.  Abdominal:     General: Bowel sounds are normal. There is no distension or abdominal bruit.     Palpations: Abdomen is soft. There is no hepatomegaly or splenomegaly.     Tenderness: There is no abdominal tenderness. There is no right CVA tenderness or left CVA tenderness.     Hernia: No hernia is present.  Musculoskeletal:        General: Normal range of motion.     Right shoulder: Normal.     Left shoulder: Normal.     Cervical back: Normal, normal range of motion and neck supple.     Thoracic back: Spasms and tenderness present.     Lumbar back: Normal.       Back:     Right lower leg: No edema.     Left lower leg: No edema.  Lymphadenopathy:     Cervical: No cervical adenopathy.  Skin:    General: Skin is warm and dry.     Capillary Refill: Capillary refill takes less than 2 seconds.     Coloration: Skin is not cyanotic, jaundiced or pale.     Findings: No rash.  Neurological:     General: No focal deficit present.     Mental Status: She is alert and oriented to person, place, and time.     Cranial Nerves: Cranial nerves are intact. No cranial nerve deficit.     Sensory: Sensation is intact. No sensory deficit.     Motor: Motor function is  intact. No weakness.     Coordination: Coordination is intact. Coordination normal.     Gait: Gait is intact. Gait normal.     Deep Tendon Reflexes: Reflexes are normal and symmetric. Reflexes normal.  Psychiatric:        Attention and Perception: Attention and perception normal.        Mood and Affect: Mood and affect normal.        Speech: Speech normal.        Behavior: Behavior normal. Behavior is cooperative.        Thought Content: Thought content normal.        Cognition and Memory: Cognition and memory normal.        Judgment: Judgment normal.     Results for orders placed or performed in visit on 11/30/18  CBC with Differential/Platelet  Result Value Ref Range   WBC 11.0 (H) 3.4 - 10.8 x10E3/uL   RBC 4.81 3.77 - 5.28 x10E6/uL   Hemoglobin 15.0 11.1 - 15.9 g/dL   Hematocrit 44.0 34.0 - 46.6 %   MCV 92 79 - 97 fL   MCH 31.2 26.6 - 33.0 pg   MCHC 34.1 31.5 - 35.7 g/dL   RDW 14.3 11.7 - 15.4 %   Platelets 167 150 - 450 x10E3/uL   Neutrophils 61 Not Estab. %   Lymphs 25 Not Estab. %   Monocytes 9 Not Estab. %   Eos 4 Not  Estab. %   Basos 1 Not Estab. %   Neutrophils Absolute 6.7 1.4 - 7.0 x10E3/uL   Lymphocytes Absolute 2.8 0.7 - 3.1 x10E3/uL   Monocytes Absolute 1.0 (H) 0.1 - 0.9 x10E3/uL   EOS (ABSOLUTE) 0.4 0.0 - 0.4 x10E3/uL   Basophils Absolute 0.1 0.0 - 0.2 x10E3/uL   Immature Granulocytes 0 Not Estab. %   Immature Grans (Abs) 0.0 0.0 - 0.1 x10E3/uL  CMP14+EGFR  Result Value Ref Range   Glucose 221 (H) 65 - 99 mg/dL   BUN 21 8 - 27 mg/dL   Creatinine, Ser 0.84 0.57 - 1.00 mg/dL   GFR calc non Af Amer 70 >59 mL/min/1.73   GFR calc Af Amer 81 >59 mL/min/1.73   BUN/Creatinine Ratio 25 12 - 28   Sodium 136 134 - 144 mmol/L   Potassium 4.6 3.5 - 5.2 mmol/L   Chloride 98 96 - 106 mmol/L   CO2 21 20 - 29 mmol/L   Calcium 9.9 8.7 - 10.3 mg/dL   Total Protein 7.1 6.0 - 8.5 g/dL   Albumin 4.3 3.7 - 4.7 g/dL   Globulin, Total 2.8 1.5 - 4.5 g/dL   Albumin/Globulin  Ratio 1.5 1.2 - 2.2   Bilirubin Total 0.4 0.0 - 1.2 mg/dL   Alkaline Phosphatase 88 39 - 117 IU/L   AST 38 0 - 40 IU/L   ALT 49 (H) 0 - 32 IU/L  Lipid panel  Result Value Ref Range   Cholesterol, Total 237 (H) 100 - 199 mg/dL   Triglycerides 546 (H) 0 - 149 mg/dL   HDL 30 (L) >39 mg/dL   VLDL Cholesterol Cal 95 (H) 5 - 40 mg/dL   LDL Chol Calc (NIH) 112 (H) 0 - 99 mg/dL   Chol/HDL Ratio 7.9 (H) 0.0 - 4.4 ratio  Bayer DCA Hb A1c Waived  Result Value Ref Range   HB A1C (BAYER DCA - WAIVED) 8.2 (H) <7.0 %       Pertinent labs & imaging results that were available during my care of the patient were reviewed by me and considered in my medical decision making.  Assessment & Plan:  Charlye was seen today for medical management of chronic issues, diabetes, hyperlipidemia and hypertension.  Diagnoses and all orders for this visit:  Type 2 diabetes mellitus without complication, without long-term current use of insulin (HCC) A1C 8.2 today.  Continue current medications and Trulicity once weekly. Diet and exercise encouraged. Labs pending.  -     CBC with Differential/Platelet -     CMP14+EGFR -     Bayer DCA Hb A1c Waived -     empagliflozin (JARDIANCE) 25 MG TABS tablet; Take 25 mg by mouth daily before breakfast. -     glimepiride (AMARYL) 4 MG tablet; Take 1 tablet (4 mg total) by mouth 2 (two) times daily. -     Dulaglutide (TRULICITY) 8.10 FB/5.1WC SOPN; Inject 0.75 mg into the skin once a week.  Hypertension associated with type 2 diabetes mellitus (HCC) BP well controlled. Changes were not made in regimen today. Goal BP is 130/80. Pt aware to report any persistent high or low readings. DASH diet and exercise encouraged. Exercise at least 150 minutes per week and increase as tolerated. Goal BMI > 25. Stress management encouraged. Avoid nicotine and tobacco product use. Avoid excessive alcohol and NSAID's. Avoid more than 2000 mg of sodium daily. Medications as prescribed. Follow up as  scheduled.  -     CBC with Differential/Platelet -  CMP14+EGFR -     Thyroid Panel With TSH -     VITAMIN D 25 Hydroxy (Vit-D Deficiency, Fractures)  Hyperlipidemia associated with type 2 diabetes mellitus ([place]) Diet encouraged - increase intake of fresh fruits and vegetables, increase intake of lean proteins. Bake, broil, or grill foods. Avoid fried, greasy, and fatty foods. Avoid fast foods. Increase intake of fiber-rich whole grains. Exercise encouraged - at least 150 minutes per week and advance as tolerated.  Goal BMI < 25. Continue medications as prescribed. Follow up in 3-6 months as discussed.  -     CBC with Differential/Platelet -     Lipid panel  Vitamin D deficiency Labs pending. Continue repletion therapy. If indicated, will change repletion dosage. Eat foods rich in Vit D including milk, orange juice, yogurt with vitamin D added, salmon or mackerel, canned tuna fish, cereals with vitamin D added, and cod liver oil. Get out in the sun but make sure to wear at least SPF 30 sunscreen.  -     CBC with Differential/Platelet -     VITAMIN D 25 Hydroxy (Vit-D Deficiency, Fractures)  Post-menopausal Doing well with below, will continue.  -     Estradiol (VAGIFEM) 10 MCG TABS vaginal tablet; Place 1 tablet (10 mcg total) vaginally 2 (two) times a week.  Acute right-sided thoracic back pain Unable to tolerate muscle relaxer. Will dose with prednisone today. Pt aware to monitor blood sugar more frequently while taking prednisone.  -     predniSONE (DELTASONE) 20 MG tablet; 2 po at sametime daily for 5 days  Encounter for screening mammogram for malignant neoplasm of breast -     MM 3D SCREEN BREAST BILATERAL; Future    Total time spent with patient 40 mintues.  Greater than 50% of encounter spent in coordination of care/counseling.  Continue all other maintenance medications.  Follow up plan: Return in about 3 months (around 06/02/2019), or if symptoms worsen or fail to  improve, for DM.  Continue healthy lifestyle choices, including diet (rich in fruits, vegetables, and lean proteins, and low in salt and simple carbohydrates) and exercise (at least 30 minutes of moderate physical activity daily).  Educational handout given for DM  The above assessment and management plan was discussed with the patient. The patient verbalized understanding of and has agreed to the management plan. Patient is aware to call the clinic if they develop any new symptoms or if symptoms persist or worsen. Patient is aware when to return to the clinic for a follow-up visit. Patient educated on when it is appropriate to go to the emergency department.   Monia Pouch, FNP-C [facility] [phone]

## 2019-03-05 NOTE — Patient Instructions (Signed)

## 2019-03-06 LAB — CMP14+EGFR
ALT: 36 IU/L — ABNORMAL HIGH (ref 0–32)
AST: 25 IU/L (ref 0–40)
Albumin/Globulin Ratio: 2 (ref 1.2–2.2)
Albumin: 4.5 g/dL (ref 3.7–4.7)
Alkaline Phosphatase: 88 IU/L (ref 39–117)
BUN/Creatinine Ratio: 28 (ref 12–28)
BUN: 25 mg/dL (ref 8–27)
Bilirubin Total: 0.3 mg/dL (ref 0.0–1.2)
CO2: 22 mmol/L (ref 20–29)
Calcium: 10 mg/dL (ref 8.7–10.3)
Chloride: 99 mmol/L (ref 96–106)
Creatinine, Ser: 0.9 mg/dL (ref 0.57–1.00)
GFR calc Af Amer: 74 mL/min/{1.73_m2} (ref >59)
GFR calc non Af Amer: 65 mL/min/{1.73_m2} (ref >59)
Globulin, Total: 2.3 g/dL (ref 1.5–4.5)
Glucose: 210 mg/dL — ABNORMAL HIGH (ref 65–99)
Potassium: 4.7 mmol/L (ref 3.5–5.2)
Sodium: 139 mmol/L (ref 134–144)
Total Protein: 6.8 g/dL (ref 6.0–8.5)

## 2019-03-06 LAB — CBC WITH DIFFERENTIAL/PLATELET
Basophils Absolute: 0.1 10*3/uL (ref 0.0–0.2)
Basos: 1 %
EOS (ABSOLUTE): 0.2 10*3/uL (ref 0.0–0.4)
Eos: 2 %
Hematocrit: 45.4 % (ref 34.0–46.6)
Hemoglobin: 15.5 g/dL (ref 11.1–15.9)
Immature Grans (Abs): 0 10*3/uL (ref 0.0–0.1)
Immature Granulocytes: 0 %
Lymphocytes Absolute: 2.7 10*3/uL (ref 0.7–3.1)
Lymphs: 26 %
MCH: 30.2 pg (ref 26.6–33.0)
MCHC: 34.1 g/dL (ref 31.5–35.7)
MCV: 88 fL (ref 79–97)
Monocytes Absolute: 0.9 10*3/uL (ref 0.1–0.9)
Monocytes: 8 %
Neutrophils Absolute: 6.6 10*3/uL (ref 1.4–7.0)
Neutrophils: 63 %
Platelets: 167 10*3/uL (ref 150–450)
RBC: 5.14 x10E6/uL (ref 3.77–5.28)
RDW: 14.3 % (ref 11.7–15.4)
WBC: 10.5 10*3/uL (ref 3.4–10.8)

## 2019-03-06 LAB — THYROID PANEL WITH TSH
Free Thyroxine Index: 1.7 (ref 1.2–4.9)
T3 Uptake Ratio: 22 % — ABNORMAL LOW (ref 24–39)
T4, Total: 7.5 ug/dL (ref 4.5–12.0)
TSH: 2.96 u[IU]/mL (ref 0.450–4.500)

## 2019-03-06 LAB — LIPID PANEL
Chol/HDL Ratio: 10 ratio — ABNORMAL HIGH (ref 0.0–4.4)
Cholesterol, Total: 270 mg/dL — ABNORMAL HIGH (ref 100–199)
HDL: 27 mg/dL — ABNORMAL LOW (ref >39)
Triglycerides: 875 mg/dL (ref 0–149)

## 2019-03-06 LAB — VITAMIN D 25 HYDROXY (VIT D DEFICIENCY, FRACTURES): Vit D, 25-Hydroxy: 22.9 ng/mL — ABNORMAL LOW (ref 30.0–100.0)

## 2019-03-08 ENCOUNTER — Telehealth: Payer: Self-pay | Admitting: Family Medicine

## 2019-03-08 DIAGNOSIS — E119 Type 2 diabetes mellitus without complications: Secondary | ICD-10-CM

## 2019-03-08 MED ORDER — TRULICITY 0.75 MG/0.5ML ~~LOC~~ SOAJ: 0.7500 mg | SUBCUTANEOUS | 1 refills | Status: DC

## 2019-03-08 NOTE — Telephone Encounter (Signed)
fax from [place] Please clarify quantitiy of Trulicity Rx Resent quantity to mail order pharmacy LMOVM resent Rx to pharmacy

## 2019-03-16 ENCOUNTER — Telehealth: Payer: Self-pay | Admitting: Family Medicine

## 2019-03-16 NOTE — Chronic Care Management (AMB) (Signed)
  Chronic Care Management   Note  03/16/2019 Name: Rita Payne MRN: [id number] DOB: 02/17/1945  Rita Payne is a 74 y.o. year old female who is a primary care patient of Rakes, Connye Burkitt, FNP. I reached out to [name] by phone today in response to a referral sent by Rita Payne health plan.     Rita Payne was given information about Chronic Care Management services today including:  1. CCM service includes personalized support from designated clinical staff supervised by her physician, including individualized plan of care and coordination with other care providers 2. 24/7 contact phone numbers for assistance for urgent and routine care needs. 3. Service will only be billed when office clinical staff spend 20 minutes or more in a month to coordinate care. 4. Only one practitioner may furnish and bill the service in a calendar month. 5. The patient may stop CCM services at any time (effective at the end of the month) by phone call to the office staff. 6. The patient will be responsible for cost sharing (co-pay) of up to 20% of the service fee (after annual deductible is met).  Patient agreed to services and verbal consent obtained.   Follow up plan: Telephone appointment with care management team member scheduled for:05/31/2019  Noreene Larsson, [place], [place],  [address] Direct Dial: [phone] [email or web address]: [email or web address]

## 2019-03-19 DIAGNOSIS — Z23 Encounter for immunization: Secondary | ICD-10-CM | POA: Diagnosis not present

## 2019-04-01 ENCOUNTER — Ambulatory Visit (INDEPENDENT_AMBULATORY_CARE_PROVIDER_SITE_OTHER): Payer: Medicare Other | Admitting: Family Medicine

## 2019-04-01 ENCOUNTER — Encounter: Payer: Self-pay | Admitting: Family Medicine

## 2019-04-01 DIAGNOSIS — G8929 Other chronic pain: Secondary | ICD-10-CM | POA: Diagnosis not present

## 2019-04-01 DIAGNOSIS — M546 Pain in thoracic spine: Secondary | ICD-10-CM

## 2019-04-01 MED ORDER — BACLOFEN 10 MG PO TABS: 10.0000 mg | ORAL_TABLET | Freq: Three times a day (TID) | ORAL | 0 refills | Status: DC

## 2019-04-01 NOTE — Progress Notes (Signed)
Virtual Visit via telephone Note Due to COVID-19 pandemic this visit was conducted virtually. This visit type was conducted due to national recommendations for restrictions regarding the COVID-19 Pandemic (e.g. social distancing, sheltering in place) in an effort to limit this patient's exposure and mitigate transmission in our community. All issues noted in this document were discussed and addressed.  A physical exam was not performed with this format.   I connected with Rita Payne on 04/01/2019 at 1005 by telephone and verified that I am speaking with the correct person using two identifiers. Rita Payne is currently located at work and coworker is currently with them during visit. The provider, Monia Pouch, FNP is located in their office at time of visit.  I discussed the limitations, risks, security and privacy concerns of performing an evaluation and management service by telephone and the availability of in person appointments. I also discussed with the patient that there may be a patient responsible charge related to this service. The patient expressed understanding and agreed to proceed.  Subjective:  Patient ID: Rita Payne, female    DOB: 1945/11/24, 74 y.o.   MRN: [id number]  Chief Complaint:  Shoulder Pain   HPI: Rita Payne is a 74 y.o. female presenting on 04/01/2019 for Shoulder Pain   Pt reports ongoing right shoulder / thoracic back pain. She has been using the lidoderm patches and Voltaren gel without complete relief of symptoms. States she has pain, stiffness, burning, and aching. States this is worse with movement and activity. No new injuries or symptoms. Reports the area does seem to spasm frequently. Has taken Baclofen in the past with relief.     Relevant past medical, surgical, family, and social history reviewed and updated as indicated.  Allergies and medications reviewed and updated.   Past Medical History:  Diagnosis Date  . Allergy   . Arthritis   .  Basal cell carcinoma   . Cataract   . Diarrhea   . First degree heart block   . GERD (gastroesophageal reflux disease)   . History of kidney stones   . Hyperlipidemia   . Hypertension   . Leg fracture   . Melanoma ([place])   . Mild intermittent asthma   . SUI (stress urinary incontinence, female)   . Type 2 diabetes mellitus ([place])     Past Surgical History:  Procedure Laterality Date  . ABDOMINAL HYSTERECTOMY  1987  . BELPHAROPTOSIS REPAIR Bilateral 2009   9   . CHOLECYSTECTOMY  1989  . HERNIA REPAIR    . kidney tumor removal   1986  . KNEE ARTHROSCOPY Bilateral   . LUMBAR LAMINECTOMY/DECOMPRESSION MICRODISCECTOMY N/A 12/08/2013   Procedure: LUMBAR DECOMPRESSION L4-L5,  L3-L4 ;  Surgeon: Johnn Hai, MD;  Location: [facility];  Service: Orthopedics;  Laterality: N/A;  . PUBOVAGINAL SLING N/A 10/03/2014   Procedure: Gaynelle Arabian;  Surgeon: Carolan Clines, MD;  Location: [facility];  Service: Urology;  Laterality: N/A;  . REPLACEMENT TOTAL HIP W/  RESURFACING IMPLANTS Right   . TONSILLECTOMY    . TOTAL HIP ARTHROPLASTY Right 08/25/2012   Procedure: RIGHT TOTAL HIP ARTHROPLASTY ANTERIOR APPROACH;  Surgeon: [name], MD;  Location: [facility];  Service: Orthopedics;  Laterality: Right;    Social History   Socioeconomic History  . Marital status: Widowed    Spouse name: Not on file  . Number of children: 2  . Years of education: Not on file  . Highest education level: Associate degree: academic  program  Occupational History  . Occupation: Paralegal   Tobacco Use  . Smoking status: Current Every Day Smoker    Packs/day: 0.25    Years: 33.00    Pack years: 8.25    Types: Cigarettes  . Smokeless tobacco: Never Used  Substance and Sexual Activity  . Alcohol use: No  . Drug use: No  . Sexual activity: Not Currently  Other Topics Concern  . Not on file  Social History Narrative  . Not on file   Social Determinants of Health   Financial Resource  Strain: Low Risk   . Difficulty of Paying Living Expenses: Not hard at all  Food Insecurity: No Food Insecurity  . Worried About Charity fundraiser in the Last Year: Never true  . Ran Out of Food in the Last Year: Never true  Transportation Needs: No Transportation Needs  . Lack of Transportation (Medical): No  . Lack of Transportation (Non-Medical): No  Physical Activity: Inactive  . Days of Exercise per Week: 0 days  . Minutes of Exercise per Session: 0 min  Stress: No Stress Concern Present  . Feeling of Stress : Only a little  Social Connections: Moderately Isolated  . Frequency of Communication with Friends and Family: More than three times a week  . Frequency of Social Gatherings with Friends and Family: More than three times a week  . Attends Religious Services: Never  . Active Member of Clubs or Organizations: No  . Attends Archivist Meetings: Never  . Marital Status: Widowed  Intimate Partner Violence: Not At Risk  . Fear of Current or Ex-Partner: No  . Emotionally Abused: No  . Physically Abused: No  . Sexually Abused: No    Outpatient Encounter Medications as of 04/01/2019  Medication Sig  . albuterol (PROAIR HFA) 108 (90 Base) MCG/ACT inhaler Inhale 2 puffs into the lungs every 6 (six) hours as needed for wheezing or shortness of breath. Pt needs for chronic asthma  . aspirin 81 MG tablet Take 1 tablet (81 mg total) by mouth daily. Resume 4 days post-op  . atorvastatin (LIPITOR) 40 MG tablet Take 1 tablet (40 mg total) by mouth daily.  . baclofen (LIORESAL) 10 MG tablet Take 1 tablet (10 mg total) by mouth 3 (three) times daily.  . celecoxib (CELEBREX) 200 MG capsule TAKE 1 CAPSULE EVERY 12 HOURS  . cetirizine (ZYRTEC) 10 MG tablet TAKE 1 TABLET DAILY  . cholecalciferol (VITAMIN D) 1000 UNITS tablet Take 1,000 Units by mouth daily.  . cloNIDine (CATAPRES) 0.1 MG tablet Take 1 tablet (0.1 mg total) by mouth 2 (two) times daily.  . diclofenac Sodium  (VOLTAREN) 1 % GEL Apply 2 g topically 4 (four) times daily.  . Dulaglutide (TRULICITY) A999333 0000000 SOPN Inject 0.75 mg into the skin once a week.  . empagliflozin (JARDIANCE) 25 MG TABS tablet Take 25 mg by mouth daily before breakfast.  . Estradiol (VAGIFEM) 10 MCG TABS vaginal tablet Place 1 tablet (10 mcg total) vaginally 2 (two) times a week.  . fluconazole (DIFLUCAN) 150 MG tablet 1 po q week x 4 weeks  . Fluticasone-Salmeterol (ADVAIR DISKUS) 250-50 MCG/DOSE AEPB USE 1 INHALATION EVERY 12 HOURS  . glimepiride (AMARYL) 4 MG tablet Take 1 tablet (4 mg total) by mouth 2 (two) times daily.  Marland Kitchen glucose blood (FREESTYLE LITE) test strip Test qd. DX: E11.9  . MICARDIS HCT 80-25 MG tablet TAKE 1 TABLET EVERY MORNING  . pantoprazole (PROTONIX) 40 MG tablet Take  1 tablet (40 mg total) by mouth daily.  . predniSONE (DELTASONE) 20 MG tablet 2 po at sametime daily for 5 days  . [DISCONTINUED] baclofen (LIORESAL) 10 MG tablet Take 1 tablet (10 mg total) by mouth 3 (three) times daily.   Facility-Administered Encounter Medications as of 04/01/2019  Medication  . magnesium citrate solution 1 Bottle  . sodium phosphate (FLEET) 7-19 GM/118ML enema 1 enema    Allergies  Allergen Reactions  . Ivp Dye [Iodinated Diagnostic Agents] Anaphylaxis  . Shellfish Allergy Anaphylaxis  . Vasotec [Enalapril] Shortness Of Breath  . Adhesive [Tape] Other (See Comments)    blisters  . Amlodipine     swelling  . Iodine Other (See Comments)    Iodine that is applied to skin causes blisters   . Pyridium [Phenazopyridine Hcl]     blisters    Review of Systems  Constitutional: Negative for activity change, appetite change, chills, diaphoresis, fatigue, fever and unexpected weight change.  HENT: Negative.   Eyes: Negative.   Respiratory: Negative for cough, chest tightness and shortness of breath.   Cardiovascular: Negative for chest pain, palpitations and leg swelling.  Gastrointestinal: Negative for  abdominal pain, blood in stool, constipation, diarrhea, nausea and vomiting.  Endocrine: Negative.   Genitourinary: Negative for decreased urine volume, difficulty urinating, dysuria, frequency and urgency.  Musculoskeletal: Positive for arthralgias and myalgias. Negative for back pain, gait problem, joint swelling, neck pain and neck stiffness.  Skin: Negative.  Negative for rash.  Allergic/Immunologic: Negative.   Neurological: Negative for dizziness, tremors, seizures, syncope, facial asymmetry, speech difficulty, weakness, light-headedness, numbness and headaches.  Hematological: Negative.   Psychiatric/Behavioral: Negative for confusion, hallucinations, sleep disturbance and suicidal ideas.  All other systems reviewed and are negative.        Observations/Objective: No vital signs or physical exam, this was a telephone or virtual health encounter.  Pt alert and oriented, answers all questions appropriately, and able to speak in full sentences.    Assessment and Plan: Rita Payne was seen today for shoulder pain.  Diagnoses and all orders for this visit:  Chronic right-sided thoracic back pain Ongoing symptoms. Will treat with short course of muscle relaxer and refer to PT. Pt aware to report any new, worsening, or persistent symptoms. Follow up after 4-6 weeks of PT.  -     baclofen (LIORESAL) 10 MG tablet; Take 1 tablet (10 mg total) by mouth 3 (three) times daily. -     Ambulatory referral to Physical Therapy     Follow Up Instructions: Return if symptoms worsen or fail to improve.    I discussed the assessment and treatment plan with the patient. The patient was provided an opportunity to ask questions and all were answered. The patient agreed with the plan and demonstrated an understanding of the instructions.   The patient was advised to call back or seek an in-person evaluation if the symptoms worsen or if the condition fails to improve as anticipated.  The above  assessment and management plan was discussed with the patient. The patient verbalized understanding of and has agreed to the management plan. Patient is aware to call the clinic if they develop any new symptoms or if symptoms persist or worsen. Patient is aware when to return to the clinic for a follow-up visit. Patient educated on when it is appropriate to go to the emergency department.    I provided 15 minutes of non-face-to-face time during this encounter. The call started at 1005. The call ended  at 1020. The other time was used for coordination of care.    Monia Pouch, FNP-C [facility] [address] [place], [place] [address] [phone] 04/01/2019

## 2019-04-02 LAB — HM DIABETES EYE EXAM

## 2019-04-03 ENCOUNTER — Other Ambulatory Visit: Payer: Self-pay | Admitting: Family Medicine

## 2019-04-03 DIAGNOSIS — I10 Essential (primary) hypertension: Secondary | ICD-10-CM

## 2019-04-06 ENCOUNTER — Other Ambulatory Visit: Payer: Self-pay | Admitting: Nurse Practitioner

## 2019-04-06 DIAGNOSIS — I10 Essential (primary) hypertension: Secondary | ICD-10-CM

## 2019-04-06 DIAGNOSIS — K219 Gastro-esophageal reflux disease without esophagitis: Secondary | ICD-10-CM

## 2019-04-06 NOTE — Telephone Encounter (Signed)
From pharmacy: YOUR PATIENT HAS REQUESTED A REFILL OF THIS MEDICATION, PREVIOUSLY AUTHORIZED BY ANOTHER PRESCRIBER.

## 2019-04-07 DIAGNOSIS — M546 Pain in thoracic spine: Secondary | ICD-10-CM | POA: Diagnosis not present

## 2019-04-07 DIAGNOSIS — M542 Cervicalgia: Secondary | ICD-10-CM | POA: Diagnosis not present

## 2019-04-13 DIAGNOSIS — M546 Pain in thoracic spine: Secondary | ICD-10-CM | POA: Diagnosis not present

## 2019-04-13 DIAGNOSIS — Z1231 Encounter for screening mammogram for malignant neoplasm of breast: Secondary | ICD-10-CM | POA: Diagnosis not present

## 2019-04-13 DIAGNOSIS — M542 Cervicalgia: Secondary | ICD-10-CM | POA: Diagnosis not present

## 2019-04-15 DIAGNOSIS — M542 Cervicalgia: Secondary | ICD-10-CM | POA: Diagnosis not present

## 2019-04-15 DIAGNOSIS — M546 Pain in thoracic spine: Secondary | ICD-10-CM | POA: Diagnosis not present

## 2019-04-16 ENCOUNTER — Encounter: Payer: Self-pay | Admitting: *Deleted

## 2019-04-19 DIAGNOSIS — M546 Pain in thoracic spine: Secondary | ICD-10-CM | POA: Diagnosis not present

## 2019-04-19 DIAGNOSIS — M542 Cervicalgia: Secondary | ICD-10-CM | POA: Diagnosis not present

## 2019-04-21 DIAGNOSIS — M542 Cervicalgia: Secondary | ICD-10-CM | POA: Diagnosis not present

## 2019-04-21 DIAGNOSIS — M546 Pain in thoracic spine: Secondary | ICD-10-CM | POA: Diagnosis not present

## 2019-05-04 DIAGNOSIS — M542 Cervicalgia: Secondary | ICD-10-CM | POA: Diagnosis not present

## 2019-05-04 DIAGNOSIS — M546 Pain in thoracic spine: Secondary | ICD-10-CM | POA: Diagnosis not present

## 2019-05-11 DIAGNOSIS — G8929 Other chronic pain: Secondary | ICD-10-CM | POA: Diagnosis not present

## 2019-05-11 DIAGNOSIS — M542 Cervicalgia: Secondary | ICD-10-CM | POA: Diagnosis not present

## 2019-05-11 DIAGNOSIS — M546 Pain in thoracic spine: Secondary | ICD-10-CM | POA: Diagnosis not present

## 2019-05-19 DIAGNOSIS — M546 Pain in thoracic spine: Secondary | ICD-10-CM | POA: Diagnosis not present

## 2019-05-19 DIAGNOSIS — M542 Cervicalgia: Secondary | ICD-10-CM | POA: Diagnosis not present

## 2019-05-19 DIAGNOSIS — G8929 Other chronic pain: Secondary | ICD-10-CM | POA: Diagnosis not present

## 2019-05-31 ENCOUNTER — Ambulatory Visit (INDEPENDENT_AMBULATORY_CARE_PROVIDER_SITE_OTHER): Payer: Medicare Other | Admitting: *Deleted

## 2019-05-31 DIAGNOSIS — E119 Type 2 diabetes mellitus without complications: Secondary | ICD-10-CM | POA: Diagnosis not present

## 2019-05-31 DIAGNOSIS — I152 Hypertension secondary to endocrine disorders: Secondary | ICD-10-CM

## 2019-05-31 DIAGNOSIS — I10 Essential (primary) hypertension: Secondary | ICD-10-CM | POA: Diagnosis not present

## 2019-05-31 DIAGNOSIS — M1710 Unilateral primary osteoarthritis, unspecified knee: Secondary | ICD-10-CM

## 2019-05-31 DIAGNOSIS — E1159 Type 2 diabetes mellitus with other circulatory complications: Secondary | ICD-10-CM

## 2019-05-31 NOTE — Chronic Care Management (AMB) (Signed)
Chronic Care Management   Initial Visit Note  05/31/2019 Name: Rita Payne MRN: [id number] DOB: 1946/01/04  Referred by: Claretta Fraise, MD Reason for referral : Chronic Care Management (Initial Visit)   Rita Payne is a 74 y.o. year old female who is a primary care patient of Stacks, Cletus Gash, MD. The CCM team was consulted for assistance with chronic disease management and care coordination needs related to HTN, DM, Asthma, GERD, HLD, OA, osteoporosis.  Review of patient status, including review of consultants reports, relevant laboratory and other test results, and collaboration with appropriate care team members and the patient's provider was performed as part of comprehensive patient evaluation and provision of chronic care management services.    Subjective: I spoke with Rita Payne by telephone today regarding management of her chronic medical conditions. 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 This keeps her busy throughout the week. She lives alone but talks with her family daily. She is also helping one of sisters and brother manage their medical conditions and IADLs. This has created some stress for her.   SDOH (Social Determinants of Health) assessments performed: Yes See Care Plan activities for detailed interventions related to SDOH  SDOH Interventions     Most Recent Value  SDOH Interventions  Physical Activity Interventions  Patient Refused [knee pain due to OA. Patient stays busy and doesn't have much free time]  Stress Interventions  Other (Comment) [recommended patient talk with Theadore Nan, LCSW. She declined at this time but will consider.]      Objective: Outpatient Encounter Medications as of 05/31/2019  Medication Sig  . MICARDIS HCT 80-25 MG tablet TAKE 1 TABLET EVERY MORNING  . albuterol (PROAIR HFA) 108 (90 Base) MCG/ACT inhaler Inhale 2 puffs into the lungs every 6 (six) hours as needed for wheezing or shortness of breath. Pt needs for chronic asthma   . aspirin 81 MG tablet Take 1 tablet (81 mg total) by mouth daily. Resume 4 days post-op  . atorvastatin (LIPITOR) 40 MG tablet Take 1 tablet (40 mg total) by mouth daily.  . celecoxib (CELEBREX) 200 MG capsule TAKE 1 CAPSULE EVERY 12 HOURS  . cetirizine (ZYRTEC) 10 MG tablet TAKE 1 TABLET DAILY  . cholecalciferol (VITAMIN D) 1000 UNITS tablet Take 1,000 Units by mouth daily.  . cloNIDine (CATAPRES) 0.1 MG tablet TAKE 1 TABLET TWICE A DAY  . diclofenac Sodium (VOLTAREN) 1 % GEL Apply 2 g topically 4 (four) times daily.  . Dulaglutide (TRULICITY) A999333 0000000 SOPN Inject 0.75 mg into the skin once a week.  . empagliflozin (JARDIANCE) 25 MG TABS tablet Take 25 mg by mouth daily before breakfast.  . Estradiol (VAGIFEM) 10 MCG TABS vaginal tablet Place 1 tablet (10 mcg total) vaginally 2 (two) times a week.  . Fluticasone-Salmeterol (ADVAIR DISKUS) 250-50 MCG/DOSE AEPB USE 1 INHALATION EVERY 12 HOURS  . glimepiride (AMARYL) 4 MG tablet Take 1 tablet (4 mg total) by mouth 2 (two) times daily.  Marland Kitchen glucose blood (FREESTYLE LITE) test strip Test qd. DX: E11.9  . pantoprazole (PROTONIX) 40 MG tablet TAKE 1 TABLET DAILY  . [DISCONTINUED] baclofen (LIORESAL) 10 MG tablet Take 1 tablet (10 mg total) by mouth 3 (three) times daily.  . [DISCONTINUED] fluconazole (DIFLUCAN) 150 MG tablet 1 po q week x 4 weeks  . [DISCONTINUED] glimepiride (AMARYL) 2 MG tablet TAKE 1 TABLET TWICE A DAY  . [DISCONTINUED] predniSONE (DELTASONE) 20 MG tablet 2 po at sametime daily for 5 days  Facility-Administered Encounter Medications as of 05/31/2019  Medication  . magnesium citrate solution 1 Bottle  . sodium phosphate (FLEET) 7-19 GM/118ML enema 1 enema     Lab Results  Component Value Date   HGBA1C 8.2 (H) 03/05/2019   HGBA1C 8.2 (H) 11/30/2018   HGBA1C 7.8 (H) 08/26/2018   Lab Results  Component Value Date   MICROALBUR 20 08/03/2013   LDLCALC Comment (A) 03/05/2019   CREATININE 0.90 03/05/2019   BP Readings  from Last 3 Encounters:  03/05/19 100/63  02/23/19 (!) 145/79  11/30/18 (!) 98/55   Lab Results  Component Value Date   CHOL 270 (H) 03/05/2019   HDL 27 (L) 03/05/2019   LDLCALC Comment (A) 03/05/2019   TRIG 875 ([place]) 03/05/2019   CHOLHDL 10.0 (H) 03/05/2019    RN Care Plan   . Chronic Disease Management Needs       CARE PLAN ENTRY (see longtitudinal plan of care for additional care plan information)  Current Barriers:  . Chronic Disease Management support, education, and care coordination needs related to HTN, DM, Asthma, GERD, HLD, OA, osteoporosis  Clinical Goal(s) related to HTN, DM, Asthma, GERD, HLD, OA, osteoporosis:  Over the next 60 days, patient will:  . Work with the care management team to address educational, disease management, and care coordination needs  . Begin or continue self health monitoring activities as directed today Measure and record cbg (blood glucose) daily times daily and Measure and record blood pressure 3 times per week . Call provider office for new or worsened signs and symptoms Blood glucose findings outside established parameters and Blood pressure findings outside established parameters . Call care management team with questions or concerns . Verbalize basic understanding of patient centered plan of care established today  Interventions related to HTN, DM, Asthma, GERD, HLD, OA, osteoporosis:  . Evaluation of current treatment plans and patient's adherence to plan as established by provider . Assessed patient understanding of disease states . Assessed patient's education and care coordination needs . Provided disease specific education to patient  . Collaborated with appropriate clinical care team members regarding patient needs . Provided with CCM contact information and encouraged to reach out as needed . Recommended she talk with LCSW regarding stress and psychosocial concerns  Patient Self Care Activities related to HTN, DM, Asthma, GERD,  HLD, OA, osteoporosis:  . Patient is able to perform ADLs and IADLs independently  Initial goal documentation         Plan:   The care management team will reach out to the patient again over the next 45 days.   Chong Sicilian, BSN, RN-BC Embedded Chronic Care Manager [facility] / [place] Management Direct Dial: [phone]

## 2019-05-31 NOTE — Patient Instructions (Signed)
Visit Information  Goals Addressed            This Visit's Progress   . Chronic Disease Management Needs       CARE PLAN ENTRY (see longtitudinal plan of care for additional care plan information)  Current Barriers:  . Chronic Disease Management support, education, and care coordination needs related to HTN, DM, Asthma, GERD, HLD, OA, osteoporosis  Clinical Goal(s) related to HTN, DM, Asthma, GERD, HLD, OA, osteoporosis:  Over the next 60 days, patient will:  . Work with the care management team to address educational, disease management, and care coordination needs  . Begin or continue self health monitoring activities as directed today Measure and record cbg (blood glucose) daily times daily and Measure and record blood pressure 3 times per week . Call provider office for new or worsened signs and symptoms Blood glucose findings outside established parameters and Blood pressure findings outside established parameters . Call care management team with questions or concerns . Verbalize basic understanding of patient centered plan of care established today  Interventions related to HTN, DM, Asthma, GERD, HLD, OA, osteoporosis:  . Evaluation of current treatment plans and patient's adherence to plan as established by provider . Assessed patient understanding of disease states . Assessed patient's education and care coordination needs . Provided disease specific education to patient  . Collaborated with appropriate clinical care team members regarding patient needs . Provided with CCM contact information and encouraged to reach out as needed . Recommended she talk with LCSW regarding stress and psychosocial concerns  Patient Self Care Activities related to HTN, DM, Asthma, GERD, HLD, OA, osteoporosis:  . Patient is able to perform ADLs and IADLs independently  Initial goal documentation        Ms. Fishman was given information about Chronic Care Management services today including:   1. CCM service includes personalized support from designated clinical staff supervised by her physician, including individualized plan of care and coordination with other care providers 2. 24/7 contact phone numbers for assistance for urgent and routine care needs. 3. Service will only be billed when office clinical staff spend 20 minutes or more in a month to coordinate care. 4. Only one practitioner may furnish and bill the service in a calendar month. 5. The patient may stop CCM services at any time (effective at the end of the month) by phone call to the office staff. 6. The patient will be responsible for cost sharing (co-pay) of up to 20% of the service fee (after annual deductible is met).  Patient agreed to services and verbal consent obtained.   The patient verbalized understanding of instructions provided today and declined a print copy of patient instruction materials.   The care management team will reach out to the patient again over the next 45 days.   Chong Sicilian, BSN, RN-BC Embedded Chronic Care Manager [facility] / [place] Management Direct Dial: [phone]

## 2019-06-03 ENCOUNTER — Other Ambulatory Visit: Payer: Self-pay

## 2019-06-03 ENCOUNTER — Encounter: Payer: Self-pay | Admitting: Family Medicine

## 2019-06-03 ENCOUNTER — Ambulatory Visit (INDEPENDENT_AMBULATORY_CARE_PROVIDER_SITE_OTHER): Payer: Medicare Other | Admitting: Family Medicine

## 2019-06-03 ENCOUNTER — Ambulatory Visit: Payer: Medicare Other | Admitting: Family Medicine

## 2019-06-03 VITALS — BP 132/75 | HR 95 | Temp 97.6°F | Resp 20 | Ht 64.0 in | Wt 181.0 lb

## 2019-06-03 DIAGNOSIS — N3090 Cystitis, unspecified without hematuria: Secondary | ICD-10-CM | POA: Diagnosis not present

## 2019-06-03 DIAGNOSIS — E1141 Type 2 diabetes mellitus with diabetic mononeuropathy: Secondary | ICD-10-CM | POA: Diagnosis not present

## 2019-06-03 DIAGNOSIS — I10 Essential (primary) hypertension: Secondary | ICD-10-CM

## 2019-06-03 DIAGNOSIS — K219 Gastro-esophageal reflux disease without esophagitis: Secondary | ICD-10-CM | POA: Diagnosis not present

## 2019-06-03 DIAGNOSIS — E1159 Type 2 diabetes mellitus with other circulatory complications: Secondary | ICD-10-CM

## 2019-06-03 DIAGNOSIS — E785 Hyperlipidemia, unspecified: Secondary | ICD-10-CM

## 2019-06-03 DIAGNOSIS — N390 Urinary tract infection, site not specified: Secondary | ICD-10-CM | POA: Diagnosis not present

## 2019-06-03 DIAGNOSIS — I152 Hypertension secondary to endocrine disorders: Secondary | ICD-10-CM

## 2019-06-03 DIAGNOSIS — E1169 Type 2 diabetes mellitus with other specified complication: Secondary | ICD-10-CM | POA: Diagnosis not present

## 2019-06-03 DIAGNOSIS — E119 Type 2 diabetes mellitus without complications: Secondary | ICD-10-CM | POA: Diagnosis not present

## 2019-06-03 LAB — URINALYSIS, COMPLETE
Bilirubin, UA: NEGATIVE
Ketones, UA: NEGATIVE
Nitrite, UA: POSITIVE — AB
Protein,UA: NEGATIVE
Specific Gravity, UA: 1.015 (ref 1.005–1.030)
Urobilinogen, Ur: 0.2 mg/dL (ref 0.2–1.0)
pH, UA: 5.5 (ref 5.0–7.5)

## 2019-06-03 LAB — MICROSCOPIC EXAMINATION
Epithelial Cells (non renal): >10 /hpf — AB (ref 0–10)
Renal Epithel, UA: NONE SEEN /hpf
WBC, UA: >30 /hpf — AB (ref 0–5)

## 2019-06-03 LAB — BAYER DCA HB A1C WAIVED: HB A1C (BAYER DCA - WAIVED): 7 % — ABNORMAL HIGH (ref <7.0)

## 2019-06-03 MED ORDER — AMOXICILLIN 500 MG PO CAPS: 500.0000 mg | ORAL_CAPSULE | Freq: Three times a day (TID) | ORAL | 0 refills | Status: DC

## 2019-06-03 MED ORDER — OMEPRAZOLE 40 MG PO CPDR: 40.0000 mg | DELAYED_RELEASE_CAPSULE | Freq: Every day | ORAL | 3 refills | Status: DC

## 2019-06-03 NOTE — Progress Notes (Signed)
Subjective:  Patient ID: Rita Payne,  female    DOB: 10-Jun-1945  Age: 74 y.o.    CC: Medical Management of Chronic Issues   HPI [name] presents for  follow-up of hypertension. Patient has no history of headache chest pain or shortness of breath or recent cough. Patient also denies symptoms of TIA such as numbness weakness lateralizing. Patient denies side effects from medication. States taking it regularly.  Patient also  in for follow-up of elevated cholesterol. Doing well without complaints on current medication. Denies side effects  including myalgia and arthralgia and nausea. Also in today for liver function testing. Currently no chest pain, shortness of breath or other cardiovascular related symptoms noted.  Follow-up of diabetes. Patient does check blood sugar at home. Readings run low 100s Patient denies symptoms such as excessive hunger or urinary frequency, excessive hunger, nausea No significant hypoglycemic spells noted.Right 3-5 toes numb at night. Relief with rubbing with lotion Medications reviewed. Pt reports taking them regularly. Pt. denies complication/adverse reaction today.   burning with urination and frequency for several days. Denies fever .  No flank pain. No nausea, vomiting. Patient in for follow-up of GERD. Currently asymptomatic taking  PPI daily.  Pantoprazole was ineffective so she has been taking over-the-counter omeprazole which is working well.  There is no chest pain or heartburn. No hematemesis and no melena. No dysphagia or choking. Onset is remote. Progression is stable. Complicating factors, none.   History Rita Payne has a past medical history of Allergy, Arthritis, Basal cell carcinoma, Cataract, Diarrhea, First degree heart block, GERD (gastroesophageal reflux disease), History of kidney stones, Hyperlipidemia, Hypertension, Leg fracture, Melanoma ([place]), Mild intermittent asthma, SUI (stress urinary incontinence, female), and Type 2 diabetes  mellitus ([place]).   She has a past surgical history that includes Tonsillectomy; Abdominal hysterectomy (1987); Cholecystectomy (1989); Knee arthroscopy (Bilateral); Hernia repair; kidney tumor removal  (1986); Blepharoptosis repair (Bilateral, 2009); Total hip arthroplasty (Right, 08/25/2012); Lumbar laminectomy/decompression microdiscectomy (N/A, 12/08/2013); Pubovaginal sling (N/A, 10/03/2014); and Replacement total hip w/  resurfacing implants (Right).   Her family history includes Arrhythmia in her brother; Arthritis/Rheumatoid in her brother and sister; Colon polyps in her sister and sister; Diabetes in her mother, sister, and sister; Heart attack in her brother and mother; Heart disease in her brother, father, mother, and sister; Heart failure in her brother and sister; Stroke in her father, mother, and sister; Vision loss in her brother.She reports that she has been smoking cigarettes. She has a 8.25 pack-year smoking history. She has never used smokeless tobacco. She reports that she does not drink alcohol or use drugs.  Current Outpatient Medications on File Prior to Visit  Medication Sig Dispense Refill  . albuterol (PROAIR HFA) 108 (90 Base) MCG/ACT inhaler Inhale 2 puffs into the lungs every 6 (six) hours as needed for wheezing or shortness of breath. Pt needs for chronic asthma 25.5 g 4  . aspirin 81 MG tablet Take 1 tablet (81 mg total) by mouth daily. Resume 4 days post-op 30 tablet   . atorvastatin (LIPITOR) 40 MG tablet Take 1 tablet (40 mg total) by mouth daily. 90 tablet 3  . celecoxib (CELEBREX) 200 MG capsule TAKE 1 CAPSULE EVERY 12 HOURS 180 capsule 3  . cetirizine (ZYRTEC) 10 MG tablet TAKE 1 TABLET DAILY 90 tablet 3  . cholecalciferol (VITAMIN D) 1000 UNITS tablet Take 1,000 Units by mouth daily.    . cloNIDine (CATAPRES) 0.1 MG tablet TAKE 1 TABLET TWICE A DAY 180 tablet  1  . diclofenac Sodium (VOLTAREN) 1 % GEL Apply 2 g topically 4 (four) times daily. 100 g 2  . Dulaglutide  (TRULICITY) 9.23 RA/0.7MA SOPN Inject 0.75 mg into the skin once a week. 6 mL 1  . empagliflozin (JARDIANCE) 25 MG TABS tablet Take 25 mg by mouth daily before breakfast. 90 tablet 1  . Estradiol (VAGIFEM) 10 MCG TABS vaginal tablet Place 1 tablet (10 mcg total) vaginally 2 (two) times a week. 24 tablet 1  . Fluticasone-Salmeterol (ADVAIR DISKUS) 250-50 MCG/DOSE AEPB USE 1 INHALATION EVERY 12 HOURS 180 each 3  . glimepiride (AMARYL) 4 MG tablet Take 1 tablet (4 mg total) by mouth 2 (two) times daily. 180 tablet 1  . glucose blood (FREESTYLE LITE) test strip Test qd. DX: E11.9 100 each 3  . MICARDIS HCT 80-25 MG tablet TAKE 1 TABLET EVERY MORNING 90 tablet 3   Current Facility-Administered Medications on File Prior to Visit  Medication Dose Route Frequency Provider Last Rate Last Admin  . magnesium citrate solution 1 Bottle  1 Bottle Oral Once Carolan Clines, MD      . sodium phosphate (FLEET) 7-19 GM/118ML enema 1 enema  1 enema Rectal Once Carolan Clines, MD        ROS Review of Systems  Constitutional: Negative.  Negative for chills, diaphoresis and fever.  HENT: Negative for congestion.   Eyes: Negative for visual disturbance.  Respiratory: Negative for cough and shortness of breath.   Cardiovascular: Negative for chest pain and palpitations.  Gastrointestinal: Negative for abdominal pain, constipation, diarrhea, nausea and vomiting.  Genitourinary: Positive for dysuria, frequency and urgency. Negative for decreased urine volume, difficulty urinating, flank pain, hematuria, menstrual problem and pelvic pain.  Musculoskeletal: Negative for arthralgias, joint swelling and myalgias.  Skin: Negative for rash.  Neurological: Negative for dizziness, numbness and headaches.  Psychiatric/Behavioral: Negative for sleep disturbance.    Objective:  BP 132/75   Pulse 95   Temp 97.6 F (36.4 C) (Temporal)   Resp 20   Ht '5\' 4"'$  (1.626 m)   Wt 181 lb (82.1 kg)   LMP 04/14/1982    SpO2 99%   BMI 31.07 kg/m   BP Readings from Last 3 Encounters:  06/03/19 132/75  03/05/19 100/63  02/23/19 (!) 145/79    Wt Readings from Last 3 Encounters:  06/03/19 181 lb (82.1 kg)  03/05/19 182 lb (82.6 kg)  02/23/19 184 lb 12.8 oz (83.8 kg)     Physical Exam  Diabetic Foot Exam - Simple   No data filed        Assessment & Plan:   Mone was seen today for medical management of chronic issues.  Diagnoses and all orders for this visit:  Type 2 diabetes mellitus with diabetic mononeuropathy, without long-term current use of insulin (HCC) -     Microalbumin / creatinine urine ratio -     Bayer DCA Hb A1c Waived -     CBC with Differential/Platelet -     CMP14+EGFR -     Lipid panel  Hypertension associated with type 2 diabetes mellitus (HCC) -     CBC with Differential/Platelet -     CMP14+EGFR -     Lipid panel  Hyperlipidemia associated with type 2 diabetes mellitus (HCC) -     CBC with Differential/Platelet -     CMP14+EGFR -     Lipid panel  Frequent UTI -     Urinalysis, Complete  Cystitis -  amoxicillin (AMOXIL) 500 MG capsule; Take 1 capsule (500 mg total) by mouth 3 (three) times daily.  Gastroesophageal reflux disease without esophagitis -     omeprazole (PRILOSEC) 40 MG capsule; Take 1 capsule (40 mg total) by mouth daily.  Other orders -     Microscopic Examination   I have discontinued Ariela Banwart "Sandy"'s pantoprazole and omeprazole. I am also having her start on amoxicillin and omeprazole. Additionally, I am having her maintain her cholecalciferol, aspirin, albuterol, atorvastatin, Fluticasone-Salmeterol, cetirizine, FREESTYLE LITE, diclofenac Sodium, celecoxib, Estradiol, empagliflozin, glimepiride, Trulicity, cloNIDine, and Micardis HCT.  Meds ordered this encounter  Medications  . amoxicillin (AMOXIL) 500 MG capsule    Sig: Take 1 capsule (500 mg total) by mouth 3 (three) times daily.    Dispense:  21 capsule    Refill:  0    . omeprazole (PRILOSEC) 40 MG capsule    Sig: Take 1 capsule (40 mg total) by mouth daily.    Dispense:  90 capsule    Refill:  3     Follow-up: Return in about 3 months (around 09/03/2019).  Claretta Fraise, M.D.

## 2019-06-03 NOTE — Patient Instructions (Signed)
Diabetes Mellitus and Foot Care Foot care is an important part of your health, especially when you have diabetes. Diabetes may cause you to have problems because of poor blood flow (circulation) to your feet and legs, which can cause your skin to:  Become thinner and drier.  Break more easily.  Heal more slowly.  Peel and crack. You may also have nerve damage (neuropathy) in your legs and feet, causing decreased feeling in them. This means that you may not notice minor injuries to your feet that could lead to more serious problems. Noticing and addressing any potential problems early is the best way to prevent future foot problems. How to care for your feet Foot hygiene  Wash your feet daily with warm water and mild soap. Do not use hot water. Then, pat your feet and the areas between your toes until they are completely dry. Do not soak your feet as this can dry your skin.  Trim your toenails straight across. Do not dig under them or around the cuticle. File the edges of your nails with an emery board or nail file.  Apply a moisturizing lotion or petroleum jelly to the skin on your feet and to dry, brittle toenails. Use lotion that does not contain alcohol and is unscented. Do not apply lotion between your toes. Shoes and socks  Wear clean socks or stockings every day. Make sure they are not too tight. Do not wear knee-high stockings since they may decrease blood flow to your legs.  Wear shoes that fit properly and have enough cushioning. Always look in your shoes before you put them on to be sure there are no objects inside.  To break in new shoes, wear them for just a few hours a day. This prevents injuries on your feet. Wounds, scrapes, corns, and calluses  Check your feet daily for blisters, cuts, bruises, sores, and redness. If you cannot see the bottom of your feet, use a mirror or ask someone for help.  Do not cut corns or calluses or try to remove them with medicine.  If you  find a minor scrape, cut, or break in the skin on your feet, keep it and the skin around it clean and dry. You may clean these areas with mild soap and water. Do not clean the area with peroxide, alcohol, or iodine.  If you have a wound, scrape, corn, or callus on your foot, look at it several times a day to make sure it is healing and not infected. Check for: ? Redness, swelling, or pain. ? Fluid or blood. ? Warmth. ? Pus or a bad smell. General instructions  Do not cross your legs. This may decrease blood flow to your feet.  Do not use heating pads or hot water bottles on your feet. They may burn your skin. If you have lost feeling in your feet or legs, you may not know this is happening until it is too late.  Protect your feet from hot and cold by wearing shoes, such as at the beach or on hot pavement.  Schedule a complete foot exam at least once a year (annually) or more often if you have foot problems. If you have foot problems, report any cuts, sores, or bruises to your health care provider immediately. Contact a health care provider if:  You have a medical condition that increases your risk of infection and you have any cuts, sores, or bruises on your feet.  You have an injury that is not   healing.  You have redness on your legs or feet.  You feel burning or tingling in your legs or feet.  You have pain or cramps in your legs and feet.  Your legs or feet are numb.  Your feet always feel cold.  You have pain around a toenail. Get help right away if:  You have a wound, scrape, corn, or callus on your foot and: ? You have pain, swelling, or redness that gets worse. ? You have fluid or blood coming from the wound, scrape, corn, or callus. ? Your wound, scrape, corn, or callus feels warm to the touch. ? You have pus or a bad smell coming from the wound, scrape, corn, or callus. ? You have a fever. ? You have a red line going up your leg. Summary  Check your feet every day  for cuts, sores, red spots, swelling, and blisters.  Moisturize feet and legs daily.  Wear shoes that fit properly and have enough cushioning.  If you have foot problems, report any cuts, sores, or bruises to your health care provider immediately.  Schedule a complete foot exam at least once a year (annually) or more often if you have foot problems. This information is not intended to replace advice given to you by your health care provider. Make sure you discuss any questions you have with your health care provider. Document Revised: 09/16/2018 Document Reviewed: 01/26/2016 [employer] Patient Education  2020 [employer]  

## 2019-06-04 LAB — CBC WITH DIFFERENTIAL/PLATELET
Basophils Absolute: 0.1 10*3/uL (ref 0.0–0.2)
Basos: 1 %
EOS (ABSOLUTE): 0.2 10*3/uL (ref 0.0–0.4)
Eos: 2 %
Hematocrit: 46.9 % — ABNORMAL HIGH (ref 34.0–46.6)
Hemoglobin: 15.6 g/dL (ref 11.1–15.9)
Immature Grans (Abs): 0 10*3/uL (ref 0.0–0.1)
Immature Granulocytes: 0 %
Lymphocytes Absolute: 2.5 10*3/uL (ref 0.7–3.1)
Lymphs: 22 %
MCH: 30.8 pg (ref 26.6–33.0)
MCHC: 33.3 g/dL (ref 31.5–35.7)
MCV: 93 fL (ref 79–97)
Monocytes Absolute: 1 10*3/uL — ABNORMAL HIGH (ref 0.1–0.9)
Monocytes: 9 %
Neutrophils Absolute: 7.8 10*3/uL — ABNORMAL HIGH (ref 1.4–7.0)
Neutrophils: 66 %
Platelets: 166 10*3/uL (ref 150–450)
RBC: 5.06 x10E6/uL (ref 3.77–5.28)
RDW: 14.6 % (ref 11.7–15.4)
WBC: 11.6 10*3/uL — ABNORMAL HIGH (ref 3.4–10.8)

## 2019-06-04 LAB — MICROALBUMIN / CREATININE URINE RATIO
Creatinine, Urine: 108.6 mg/dL
Microalb/Creat Ratio: 25 mg/g creat (ref 0–29)
Microalbumin, Urine: 27.6 ug/mL

## 2019-06-04 LAB — CMP14+EGFR
ALT: 37 IU/L — ABNORMAL HIGH (ref 0–32)
AST: 29 IU/L (ref 0–40)
Albumin/Globulin Ratio: 1.5 (ref 1.2–2.2)
Albumin: 4.3 g/dL (ref 3.7–4.7)
Alkaline Phosphatase: 80 IU/L (ref 48–121)
BUN/Creatinine Ratio: 24 (ref 12–28)
BUN: 21 mg/dL (ref 8–27)
Bilirubin Total: 0.4 mg/dL (ref 0.0–1.2)
CO2: 23 mmol/L (ref 20–29)
Calcium: 9.7 mg/dL (ref 8.7–10.3)
Chloride: 100 mmol/L (ref 96–106)
Creatinine, Ser: 0.89 mg/dL (ref 0.57–1.00)
GFR calc Af Amer: 75 mL/min/{1.73_m2} (ref >59)
GFR calc non Af Amer: 65 mL/min/{1.73_m2} (ref >59)
Globulin, Total: 2.8 g/dL (ref 1.5–4.5)
Glucose: 141 mg/dL — ABNORMAL HIGH (ref 65–99)
Potassium: 4 mmol/L (ref 3.5–5.2)
Sodium: 139 mmol/L (ref 134–144)
Total Protein: 7.1 g/dL (ref 6.0–8.5)

## 2019-06-04 LAB — LIPID PANEL
Chol/HDL Ratio: 7.1 ratio — ABNORMAL HIGH (ref 0.0–4.4)
Cholesterol, Total: 221 mg/dL — ABNORMAL HIGH (ref 100–199)
HDL: 31 mg/dL — ABNORMAL LOW (ref >39)
LDL Chol Calc (NIH): 123 mg/dL — ABNORMAL HIGH (ref 0–99)
Triglycerides: 378 mg/dL — ABNORMAL HIGH (ref 0–149)
VLDL Cholesterol Cal: 67 mg/dL — ABNORMAL HIGH (ref 5–40)

## 2019-06-07 ENCOUNTER — Encounter: Payer: Self-pay | Admitting: Family Medicine

## 2019-06-21 ENCOUNTER — Ambulatory Visit (INDEPENDENT_AMBULATORY_CARE_PROVIDER_SITE_OTHER): Payer: Medicare Other | Admitting: *Deleted

## 2019-06-21 DIAGNOSIS — Z0000 Encounter for general adult medical examination without abnormal findings: Secondary | ICD-10-CM

## 2019-06-21 NOTE — Progress Notes (Signed)
MEDICARE ANNUAL WELLNESS VISIT  06/21/2019  Telephone Visit Disclaimer This Medicare AWV was conducted by telephone due to national recommendations for restrictions regarding the COVID-19 Pandemic (e.g. social distancing).  I verified, using two identifiers, that I am speaking with Rita Payne or their authorized healthcare agent. I discussed the limitations, risks, security, and privacy concerns of performing an evaluation and management service by telephone and the potential availability of an in-person appointment in the future. The patient expressed understanding and agreed to proceed.   Subjective:  Rita Payne is a 74 y.o. female patient of [facility], Cletus Gash, MD who had a Medicare Annual Wellness Visit today via telephone. 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 She is widowed and has 2 children. She reports that she is socially active and does interact with friends/family regularly. She is not physically active and enjoys reading.   Patient Care Team: Claretta Fraise, MD as PCP - General (Family Medicine) Paralee Cancel, MD as Consulting Physician (Orthopedic Surgery) Susa Day, MD as Consulting Physician (Orthopedic Surgery) Sandford Craze, MD as Referring Physician (Dermatology) Carolan Clines, MD (Inactive) as Consulting Physician (Urology) Ilean China, RN as Registered Nurse  Advanced Directives 06/21/2019 06/18/2018 06/11/2017 06/05/2016 10/03/2014 09/29/2014 12/08/2013  Does Patient Have a Medical Advance Directive? Yes Yes Yes Yes Yes Yes Yes  Type of Paramedic of [place];Living will [place];Living will [place];Living will Healthcare Power of [place];Living will - Living will;Advance instruction for mental health treatment  Does patient want to make changes to medical advance directive? No - Patient declined No - Patient declined - No -  Patient declined No - Patient declined - No - Patient declined  Copy of [place] in Chart? No - copy requested No - copy requested No - copy requested No - copy requested No - copy requested No - copy requested No - copy requested  Pre-existing out of facility DNR order (yellow form or pink MOST form) - - - - - - [facility] Utilization Over the Past 12 Months: # of hospitalizations or ER visits: 0 # of surgeries: 0  Review of Systems    Patient reports that her overall health is better compared to last year.  History obtained from the patient and patient chart.   Patient Reported Readings (BP, Pulse, CBG, Weight, etc) none  Pain Assessment Pain : No/denies pain     Current Medications & Allergies (verified) Allergies as of 06/21/2019      Reactions   Ivp Dye [iodinated Diagnostic Agents] Anaphylaxis   Shellfish Allergy Anaphylaxis   Vasotec [enalapril] Shortness Of Breath   Adhesive [tape] Other (See Comments)   blisters   Amlodipine    swelling   Iodine Other (See Comments)   Iodine that is applied to skin causes blisters    Pyridium [phenazopyridine Hcl]    blisters      Medication List       Accurate as of June 21, 2019  3:22 PM. If you have any questions, ask your nurse or doctor.        STOP taking these medications   amoxicillin 500 MG capsule Commonly known as: AMOXIL     TAKE these medications   albuterol 108 (90 Base) MCG/ACT inhaler Commonly known as: ProAir HFA Inhale 2 puffs into the lungs every 6 (six) hours as needed for wheezing or shortness  of breath. Pt needs for chronic asthma   aspirin 81 MG tablet Take 1 tablet (81 mg total) by mouth daily. Resume 4 days post-op   atorvastatin 40 MG tablet Commonly known as: LIPITOR Take 40 mg by mouth daily. What changed: Another medication with the same name was removed. Continue taking this medication, and follow the directions you see here.   celecoxib 200 MG  capsule Commonly known as: CELEBREX TAKE 1 CAPSULE EVERY 12 HOURS   cetirizine 10 MG tablet Commonly known as: ZYRTEC TAKE 1 TABLET DAILY   cholecalciferol 1000 units tablet Commonly known as: VITAMIN D Take 1,000 Units by mouth daily.   cloNIDine 0.1 MG tablet Commonly known as: CATAPRES TAKE 1 TABLET TWICE A DAY   diclofenac Sodium 1 % Gel Commonly known as: VOLTAREN Apply 2 g topically 4 (four) times daily.   empagliflozin 25 MG Tabs tablet Commonly known as: JARDIANCE Take 25 mg by mouth daily before breakfast.   Estradiol 10 MCG Tabs vaginal tablet Commonly known as: Vagifem Place 1 tablet (10 mcg total) vaginally 2 (two) times a week.   Fluticasone-Salmeterol 250-50 MCG/DOSE Aepb Commonly known as: Advair Diskus USE 1 INHALATION EVERY 12 HOURS   FREESTYLE LITE test strip Generic drug: glucose blood Test qd. DX: E11.9   glimepiride 4 MG tablet Commonly known as: AMARYL Take 1 tablet (4 mg total) by mouth 2 (two) times daily.   Micardis HCT 80-25 MG tablet Generic drug: telmisartan-hydrochlorothiazide TAKE 1 TABLET EVERY MORNING   omeprazole 40 MG capsule Commonly known as: PRILOSEC Take 1 capsule (40 mg total) by mouth daily.   Trulicity 3.78 HY/8.5OY Sopn Generic drug: Dulaglutide Inject 0.75 mg into the skin once a week.       History (reviewed): Past Medical History:  Diagnosis Date  . Allergy   . Arthritis   . Basal cell carcinoma   . Cataract   . Diarrhea   . First degree heart block   . GERD (gastroesophageal reflux disease)   . History of kidney stones   . Hyperlipidemia   . Hypertension   . Leg fracture   . Melanoma ([place])   . Mild intermittent asthma   . SUI (stress urinary incontinence, female)   . Type 2 diabetes mellitus ([place])    Past Surgical History:  Procedure Laterality Date  . ABDOMINAL HYSTERECTOMY  1987  . BELPHAROPTOSIS REPAIR Bilateral 2009   9   . CHOLECYSTECTOMY  1989  . HERNIA REPAIR    . kidney tumor removal    1986  . KNEE ARTHROSCOPY Bilateral   . LUMBAR LAMINECTOMY/DECOMPRESSION MICRODISCECTOMY N/A 12/08/2013   Procedure: LUMBAR DECOMPRESSION L4-L5,  L3-L4 ;  Surgeon: Johnn Hai, MD;  Location: [facility];  Service: Orthopedics;  Laterality: N/A;  . PUBOVAGINAL SLING N/A 10/03/2014   Procedure: Gaynelle Arabian;  Surgeon: Carolan Clines, MD;  Location: [facility];  Service: Urology;  Laterality: N/A;  . REPLACEMENT TOTAL HIP W/  RESURFACING IMPLANTS Right   . TONSILLECTOMY    . TOTAL HIP ARTHROPLASTY Right 08/25/2012   Procedure: RIGHT TOTAL HIP ARTHROPLASTY ANTERIOR APPROACH;  Surgeon: [name], MD;  Location: [facility];  Service: Orthopedics;  Laterality: Right;   Family History  Problem Relation Age of Onset  . Diabetes Mother   . Heart disease Mother   . Stroke Mother   . Heart attack Mother   . Heart disease Father   . Stroke Father   . Vision loss Brother   .  Arthritis/Rheumatoid Brother   . Diabetes Sister   . Heart disease Sister   . Heart failure Sister   . Diabetes Sister   . Stroke Sister   . Colon polyps Sister   . Heart disease Brother   . Heart failure Brother   . Heart attack Brother   . Arrhythmia Brother   . Arthritis/Rheumatoid Sister   . Colon polyps Sister   . Colon cancer Neg Hx   . Esophageal cancer Neg Hx   . Stomach cancer Neg Hx   . Rectal cancer Neg Hx    Social History   Socioeconomic History  . Marital status: Widowed    Spouse name: Not on file  . Number of children: 2  . Years of education: some college  . Highest education level: Some college, no degree  Occupational History  . Occupation: Paralegal   Tobacco Use  . Smoking status: Current Every Day Smoker    Packs/day: 0.25    Years: 33.00    Pack years: 8.25    Types: Cigarettes  . Smokeless tobacco: Never Used  Vaping Use  . Vaping Use: Never used  Substance and Sexual Activity  . Alcohol use: No  . Drug use: No  . Sexual activity: Not Currently  Other  Topics Concern  . Not on file  Social History Narrative  . Not on file   Social Determinants of Health   Financial Resource Strain: Low Risk   . Difficulty of Paying Living Expenses: Not hard at all  Food Insecurity: No Food Insecurity  . Worried About Charity fundraiser in the Last Year: Never true  . Ran Out of Food in the Last Year: Never true  Transportation Needs: No Transportation Needs  . Lack of Transportation (Medical): No  . Lack of Transportation (Non-Medical): No  Physical Activity:   . Days of Exercise per Week:   . Minutes of Exercise per Session:   Stress: Stress Concern Present  . Feeling of Stress : To some extent  Social Connections: Socially Isolated  . Frequency of Communication with Friends and Family: More than three times a week  . Frequency of Social Gatherings with Friends and Family: More than three times a week  . Attends Religious Services: Never  . Active Member of Clubs or Organizations: No  . Attends Archivist Meetings: Never  . Marital Status: Widowed    Activities of Daily Living In your present state of health, do you have any difficulty performing the following activities: 06/21/2019 05/31/2019  Hearing? N N  Vision? N N  Difficulty concentrating or making decisions? N N  Walking or climbing stairs? N N  Dressing or bathing? N N  Doing errands, shopping? N N  Preparing Food and eating ? N N  Using the Toilet? N N  In the past six months, have you accidently leaked urine? N N  Comment - bladder lift a few years ago when she had a tumor removed  Do you have problems with loss of bowel control? N N  Managing your Medications? N N  Managing your Finances? N N  Housekeeping or managing your Housekeeping? N N  Some recent data might be hidden    Patient Education/ Literacy How often do you need to have someone help you when you read instructions, pamphlets, or other written materials from your doctor or pharmacy?: 1 - Never What  is the last grade level you completed in school?: some college  Exercise Current  Exercise Habits: The patient does not participate in regular exercise at present, Exercise limited by: None identified  Diet Patient reports consuming 3 meals a day and 1 snack(s) a day Patient reports that her primary diet is: Diabetic Patient reports that she does have regular access to food.   Depression Screen PHQ 2/9 Scores 06/21/2019 06/03/2019 05/31/2019 03/05/2019 02/23/2019 11/30/2018 08/26/2018  PHQ - 2 Score 0 0 0 0 0 0 0  PHQ- 9 Score - - - - - - -     Fall Risk Fall Risk  06/21/2019 06/03/2019 05/31/2019 03/05/2019 02/23/2019  Falls in the past year? 0 0 0 0 0  Number falls in past yr: - - 0 - -  Injury with Fall? - - 0 - -  Risk for fall due to : - - - - -  Risk for fall due to: Comment - - - - -  Follow up - Falls evaluation completed - - -     Objective:  [place] seemed alert and oriented and she participated appropriately during our telephone visit.  Blood Pressure Weight BMI  BP Readings from Last 3 Encounters:  06/03/19 132/75  03/05/19 100/63  02/23/19 (!) 145/79   Wt Readings from Last 3 Encounters:  06/03/19 181 lb (82.1 kg)  03/05/19 182 lb (82.6 kg)  02/23/19 184 lb 12.8 oz (83.8 kg)   BMI Readings from Last 1 Encounters:  06/03/19 31.07 kg/m    *Unable to obtain current vital signs, weight, and BMI due to telephone visit type  Hearing/Vision  . Natonya did not seem to have difficulty with hearing/understanding during the telephone conversation . Reports that she has had a formal eye exam by an eye care professional within the past year . Reports that she has not had a formal hearing evaluation within the past year *Unable to fully assess hearing and vision during telephone visit type  Cognitive Function: 6CIT Screen 06/21/2019 06/18/2018  What Year? 0 points 0 points  What month? 0 points 0 points  What time? 0 points 0 points  Count back from 20 0 points 0 points   Months in reverse 0 points 0 points  Repeat phrase 2 points 2 points  Total Score 2 2   (Normal:0-7, Significant for Dysfunction: >8)  Normal Cognitive Function Screening: Yes   Immunization & Health Maintenance Record Immunization History  Administered Date(s) Administered  . Fluad Quad(high Dose 65+) 11/30/2018  . Influenza, High Dose Seasonal PF 11/08/2016, 10/10/2017  . Influenza,inj,Quad PF,6+ Mos 10/21/2012, 11/11/2013, 12/23/2014, 10/05/2015  . Influenza,inj,quad, With Preservative 10/07/2016  . Moderna SARS-COVID-2 Vaccination 02/18/2019, 03/19/2019  . Pneumococcal Conjugate-13 06/01/2014  . Pneumococcal Polysaccharide-23 07/14/2012  . Pneumococcal-Unspecified 01/08/2015  . Tdap 07/15/2007    Health Maintenance  Topic Date Due  . TETANUS/TDAP  08/26/2019 (Originally 07/14/2017)  . INFLUENZA VACCINE  08/08/2019  . FOOT EXAM  08/26/2019  . HEMOGLOBIN A1C  09/03/2019  . OPHTHALMOLOGY EXAM  04/01/2020  . Fecal DNA (Cologuard)  06/17/2020  . MAMMOGRAM  04/12/2021  . DEXA SCAN  Completed  . COVID-19 Vaccine  Completed  . Hepatitis C Screening  Completed  . PNA vac Low Risk Adult  Completed       Assessment  This is a routine wellness examination for [employer].  Health Maintenance: Due or Overdue There are no preventive care reminders to display for this patient.  Aniza Shor does not need a referral for Community Assistance: Care Management:   no Social Work:  no Prescription Assistance:  no Nutrition/Diabetes Education:  no   Plan:  Personalized Goals Goals Addressed            This Visit's Progress   . Patient Stated       06/21/2019 AWV Goal: Keep All Scheduled Appointments  Over the next year, patient will attend all scheduled appointments with their PCP and any specialists that they see.       Personalized Health Maintenance & Screening Recommendations    Lung Cancer Screening Recommended: yes (Low Dose CT Chest recommended if Age 38-80  years, 30 pack-year currently smoking OR have quit w/in past 15 years) Hepatitis C Screening recommended: no HIV Screening recommended: no  Advanced Directives: Written information was not prepared per patient's request.  Referrals & Orders No orders of the defined types were placed in this encounter.   Follow-up Plan . Follow-up with Claretta Fraise, MD as planned  I have personally reviewed and noted the following in the patient's chart:   . Medical and social history . Use of alcohol, tobacco or illicit drugs  . Current medications and supplements . Functional ability and status . Nutritional status . Physical activity . Advanced directives . List of other physicians . Hospitalizations, surgeries, and ER visits in previous 12 months . Vitals . Screenings to include cognitive, depression, and falls . Referrals and appointments  In addition, I have reviewed and discussed with Rita Payne certain preventive protocols, quality metrics, and best practice recommendations. A written personalized care plan for preventive services as well as general preventive health recommendations is available and can be mailed to the patient at her request.      Baldomero Lamy, LPN  7/34/2876

## 2019-07-02 ENCOUNTER — Encounter: Payer: Self-pay | Admitting: Gastroenterology

## 2019-07-19 ENCOUNTER — Ambulatory Visit: Payer: Medicare Other | Admitting: *Deleted

## 2019-07-19 DIAGNOSIS — E1141 Type 2 diabetes mellitus with diabetic mononeuropathy: Secondary | ICD-10-CM

## 2019-07-19 DIAGNOSIS — E1169 Type 2 diabetes mellitus with other specified complication: Secondary | ICD-10-CM

## 2019-07-19 NOTE — Chronic Care Management (AMB) (Signed)
  Chronic Care Management   Follow-up Outreach Note  07/19/2019 Name: Rita Payne MRN: [id number] DOB: Nov 21, 1945  Referred by: Claretta Fraise, MD Reason for referral : Chronic Care Management (RN follow up)   An unsuccessful telephone follow-up was attempted today. The patient was referred to the case management team for assistance with care management and care coordination. Patient was seen by Dr Livia Snellen on 06/03/19 and had lab work done. Per EMR, her cholesterol was elevated and after discussion with clinical staff she planned to resume daily Lipitor. She had been doing QOD due to leg pain.   Follow Up Plan: A HIPPA compliant phone message was left for the patient providing contact information and requesting a return call.  The care management team will reach out to the patient again over the next 30 days.   Chong Sicilian, BSN, RN-BC Embedded Chronic Care Manager [facility] Direct Dial: [phone]

## 2019-07-31 ENCOUNTER — Other Ambulatory Visit: Payer: Self-pay | Admitting: Nurse Practitioner

## 2019-08-16 ENCOUNTER — Telehealth: Payer: Self-pay | Admitting: Family Medicine

## 2019-08-16 DIAGNOSIS — E119 Type 2 diabetes mellitus without complications: Secondary | ICD-10-CM

## 2019-08-16 MED ORDER — TRULICITY 0.75 MG/0.5ML ~~LOC~~ SOAJ: 0.7500 mg | SUBCUTANEOUS | 0 refills | Status: DC

## 2019-08-16 NOTE — Telephone Encounter (Signed)
Pt aware refill sent to mail order pharmacy and sample set aside for her

## 2019-08-16 NOTE — Telephone Encounter (Signed)
  Prescription Request  08/16/2019  What is the name of the medication or equipment? Trulicity RX  Have you contacted your pharmacy to request a refill? (if applicable) Yes  Which pharmacy would you like this sent to? [facility]   Patient notified that their request is being sent to the clinical staff for review and that they should receive a response within 2 business days.   St[facility]' pt.  Please send RX into Pharmacy[facility]  Please call her bc she will be out by Sunday.  She wants a 10-day sample until she can get meds.   They keep sending to [facility] & she sees [facility] now.

## 2019-08-20 ENCOUNTER — Other Ambulatory Visit: Payer: Self-pay

## 2019-08-20 ENCOUNTER — Ambulatory Visit: Payer: Medicare Other | Admitting: *Deleted

## 2019-08-20 VITALS — Ht 64.0 in | Wt 180.0 lb

## 2019-08-20 DIAGNOSIS — Z86010 Personal history of colonic polyps: Secondary | ICD-10-CM

## 2019-08-20 MED ORDER — PLENVU 140 G PO SOLR: 1.0000 | Freq: Once | ORAL | 0 refills | Status: AC

## 2019-08-20 NOTE — Progress Notes (Signed)

## 2019-08-24 ENCOUNTER — Telehealth: Payer: Self-pay | Admitting: *Deleted

## 2019-08-24 ENCOUNTER — Telehealth: Payer: Medicare Other | Admitting: *Deleted

## 2019-08-24 NOTE — Telephone Encounter (Signed)
  Chronic Care Management   Follow-up Outreach Note  08/24/2019 Name: Rita Payne MRN: [id number] DOB: Nov 15, 1945  Referred by: Claretta Fraise, MD Reason for referral : Chronic Care Management (RN follow-up)   A 2nd unsuccessful telephone follow-up was attempted today. The patient was referred to the case management team for assistance with care management and care coordination.   Follow Up Plan: A HIPAA compliant phone message was left for the patient providing contact information and requesting a return call.  The care management team will reach out to the patient again over the next 10 days.   Chong Sicilian, BSN, RN-BC Embedded Chronic Care Manager [facility] [place] [facility] Direct Dial: [phone]

## 2019-08-27 ENCOUNTER — Encounter: Payer: Self-pay | Admitting: Gastroenterology

## 2019-08-27 ENCOUNTER — Ambulatory Visit (AMBULATORY_SURGERY_CENTER): Payer: Medicare Other | Admitting: Gastroenterology

## 2019-08-27 ENCOUNTER — Other Ambulatory Visit: Payer: Self-pay

## 2019-08-27 VITALS — BP 96/63 | HR 87 | Temp 97.3°F | Resp 16 | Ht 64.0 in | Wt 180.0 lb

## 2019-08-27 DIAGNOSIS — D122 Benign neoplasm of ascending colon: Secondary | ICD-10-CM

## 2019-08-27 DIAGNOSIS — D128 Benign neoplasm of rectum: Secondary | ICD-10-CM | POA: Diagnosis not present

## 2019-08-27 DIAGNOSIS — Z86010 Personal history of colonic polyps: Secondary | ICD-10-CM | POA: Diagnosis not present

## 2019-08-27 DIAGNOSIS — K635 Polyp of colon: Secondary | ICD-10-CM | POA: Diagnosis not present

## 2019-08-27 DIAGNOSIS — D123 Benign neoplasm of transverse colon: Secondary | ICD-10-CM

## 2019-08-27 MED ORDER — SODIUM CHLORIDE 0.9 % IV SOLN
500.0000 mL | Freq: Once | INTRAVENOUS | Status: DC
Start: 2019-08-27 — End: 2019-08-27

## 2019-08-27 NOTE — Progress Notes (Signed)
pt tolerated well. VSS. awake and to recovery. Report given to RN. Oral airway removed with pt cooperation. No trauma.

## 2019-08-27 NOTE — Progress Notes (Signed)
Called to room to assist during endoscopic procedure.  Patient ID and intended procedure confirmed with present staff. Received instructions for my participation in the procedure from the performing physician.  

## 2019-08-27 NOTE — Progress Notes (Signed)
Pt's states no medical or surgical changes since previsit or office visit.  VS CW  

## 2019-08-27 NOTE — Op Note (Signed)
[facility] Patient Name: Rita Payne Procedure Date: 08/27/2019 10:18 AM MRN: [id number] Endoscopist: [place]. Loletha Carrow , MD Age: 74 Referring MD:  Date of Birth: 09-Apr-1945 Gender: Female Account #: [id number] Procedure:                Colonoscopy Indications:              Surveillance: Piecemeal removal of large sessile                            adenoma last colonoscopy (< 3 yrs) - 08/2017 > 10 TA                            08/2018 multiple TAs, including one > 3mm in right                            colon requiring piecemeal resection) Medicines:                Monitored Anesthesia Care Procedure:                Pre-Anesthesia Assessment:                           - Prior to the procedure, a History and Physical                            was performed, and patient medications and                            allergies were reviewed. The patient's tolerance of                            previous anesthesia was also reviewed. The risks                            and benefits of the procedure and the sedation                            options and risks were discussed with the patient.                            All questions were answered, and informed consent                            was obtained. Prior Anticoagulants: The patient has                            taken no previous anticoagulant or antiplatelet                            agents. ASA Grade Assessment: III - A patient with                            severe systemic disease. After reviewing the risks  and benefits, the patient was deemed in                            satisfactory condition to undergo the procedure.                           After obtaining informed consent, the colonoscope                            was passed under direct vision. Throughout the                            procedure, the patient's blood pressure, pulse, and                            oxygen saturations  were monitored continuously. The                            Colonoscope was introduced through the anus and                            advanced to the the cecum, identified by                            appendiceal orifice and ileocecal valve. The                            colonoscopy was somewhat difficult due to a                            redundant colon and patient's restless leg.                            Successful completion of the procedure was aided by                            using manual pressure. The patient tolerated the                            procedure well. The quality of the bowel                            preparation was excellent. The ileocecal valve,                            appendiceal orifice, and rectum were photographed.                            The bowel preparation used was Plenvu. Scope In: 10:36:28 AM Scope Out: 10:59:02 AM Scope Withdrawal Time: 0 hours 16 minutes 55 seconds  Total Procedure Duration: 0 hours 22 minutes 34 seconds  Findings:                 The digital rectal exam findings include decreased  sphincter tone.                           A tattoo was seen in the distal ascending colon. A                            post-polypectomy scar was found at the tattoo site.                           A 6 mm polyp was found in the distal ascending                            colon (at prior polypectomy site). The polyp was                            semi-sessile. The polyp was removed with a cold                            snare. Resection and retrieval were complete.                           A 8 mm polyp was found in the proximal transverse                            colon. The polyp was sessile. The polyp was removed                            with a cold snare. Resection and retrieval were                            complete.                           A diminutive polyp was found in the distal rectum.                             The polyp was flat. The polyp was removed with a                            cold snare. Resection was complete, but the polyp                            tissue was not retrieved.                           Retroflexion in the rectum was not performed due to                            narrow anatomy and difficulty retaining air.                           The exam was otherwise without abnormality. Complications:            No immediate  complications. Estimated Blood Loss:     Estimated blood loss was minimal. Impression:               - Decreased sphincter tone found on digital rectal                            exam.                           - A tattoo was seen in the distal ascending colon.                            A post-polypectomy scar was found at the tattoo                            site.                           - One 6 mm polyp in the distal ascending colon,                            removed with a cold snare. Resected and retrieved.                           - One 8 mm polyp in the proximal transverse colon,                            removed with a cold snare. Resected and retrieved.                           - One diminutive polyp in the distal rectum,                            removed with a cold snare. Complete resection.                            Polyp tissue not retrieved.                           - The examination was otherwise normal. Recommendation:           - Patient has a contact number available for                            emergencies. The signs and symptoms of potential                            delayed complications were discussed with the                            patient. Return to normal activities tomorrow.                            Written discharge instructions were provided to the  patient.                           - Resume previous diet.                           - Continue present medications.                            - Await pathology results.                           - Repeat colonoscopy is recommended for                            surveillance. The colonoscopy date will be                            determined after pathology results from today's                            exam become available for review. Talin Rozeboom L. Loletha Carrow, MD 08/27/2019 11:07:04 AM This report has been signed electronically.

## 2019-08-27 NOTE — Patient Instructions (Signed)
Handout on polyps given to you today  Await pathology results   YOU HAD AN ENDOSCOPIC PROCEDURE TODAY AT THE [facility]:   Refer to the procedure report that was given to you for any specific questions about what was found during the examination.  If the procedure report does not answer your questions, please call your gastroenterologist to clarify.  If you requested that your care partner not be given the details of your procedure findings, then the procedure report has been included in a sealed envelope for you to review at your convenience later.  YOU SHOULD EXPECT: Some feelings of bloating in the abdomen. Passage of more gas than usual.  Walking can help get rid of the air that was put into your GI tract during the procedure and reduce the bloating. If you had a lower endoscopy (such as a colonoscopy or flexible sigmoidoscopy) you may notice spotting of blood in your stool or on the toilet paper. If you underwent a bowel prep for your procedure, you may not have a normal bowel movement for a few days.  Please Note:  You might notice some irritation and congestion in your nose or some drainage.  This is from the oxygen used during your procedure.  There is no need for concern and it should clear up in a day or so.  SYMPTOMS TO REPORT IMMEDIATELY:   Following lower endoscopy (colonoscopy or flexible sigmoidoscopy):  Excessive amounts of blood in the stool  Significant tenderness or worsening of abdominal pains  Swelling of the abdomen that is new, acute  Fever of 100F or higher  For urgent or emergent issues, a gastroenterologist can be reached at any hour by calling [phone]. Do not use MyChart messaging for urgent concerns.    DIET:  We do recommend a small meal at first, but then you may proceed to your regular diet.  Drink plenty of fluids but you should avoid alcoholic beverages for 24 hours.  ACTIVITY:  You should plan to take it easy for the rest of today and  you should NOT DRIVE or use heavy machinery until tomorrow (because of the sedation medicines used during the test).    FOLLOW UP: Our staff will call the number listed on your records 48-72 hours following your procedure to check on you and address any questions or concerns that you may have regarding the information given to you following your procedure. If we do not reach you, we will leave a message.  We will attempt to reach you two times.  During this call, we will ask if you have developed any symptoms of COVID 19. If you develop any symptoms (ie: fever, flu-like symptoms, shortness of breath, cough etc.) before then, please call [phone].  If you test positive for Covid 19 in the 2 weeks post procedure, please call and report this information to us.    If any biopsies were taken you will be contacted by phone or by letter within the next 1-3 weeks.  Please call us at [phone] if you have not heard about the biopsies in 3 weeks.    SIGNATURES/CONFIDENTIALITY: You and/or your care partner have signed paperwork which will be entered into your electronic medical record.  These signatures attest to the fact that that the information above on your After Visit Summary has been reviewed and is understood.  Full responsibility of the confidentiality of this discharge information lies with you and/or your care-partner. 

## 2019-08-31 ENCOUNTER — Telehealth: Payer: Self-pay

## 2019-08-31 ENCOUNTER — Telehealth: Payer: Self-pay | Admitting: *Deleted

## 2019-08-31 NOTE — Telephone Encounter (Signed)
Second attempt follow up call to pt, no answer, no vm.

## 2019-08-31 NOTE — Telephone Encounter (Signed)
First attempt, left VM.  

## 2019-09-01 ENCOUNTER — Encounter: Payer: Self-pay | Admitting: Gastroenterology

## 2019-09-07 ENCOUNTER — Ambulatory Visit (INDEPENDENT_AMBULATORY_CARE_PROVIDER_SITE_OTHER): Payer: Medicare Other | Admitting: Family Medicine

## 2019-09-07 ENCOUNTER — Encounter: Payer: Self-pay | Admitting: Family Medicine

## 2019-09-07 ENCOUNTER — Other Ambulatory Visit: Payer: Self-pay

## 2019-09-07 VITALS — BP 114/66 | HR 98 | Temp 97.4°F | Resp 20 | Ht 64.0 in | Wt 182.1 lb

## 2019-09-07 DIAGNOSIS — M1990 Unspecified osteoarthritis, unspecified site: Secondary | ICD-10-CM | POA: Diagnosis not present

## 2019-09-07 DIAGNOSIS — E1169 Type 2 diabetes mellitus with other specified complication: Secondary | ICD-10-CM

## 2019-09-07 DIAGNOSIS — E119 Type 2 diabetes mellitus without complications: Secondary | ICD-10-CM | POA: Diagnosis not present

## 2019-09-07 DIAGNOSIS — I10 Essential (primary) hypertension: Secondary | ICD-10-CM | POA: Diagnosis not present

## 2019-09-07 DIAGNOSIS — J4520 Mild intermittent asthma, uncomplicated: Secondary | ICD-10-CM

## 2019-09-07 DIAGNOSIS — E1159 Type 2 diabetes mellitus with other circulatory complications: Secondary | ICD-10-CM

## 2019-09-07 DIAGNOSIS — K219 Gastro-esophageal reflux disease without esophagitis: Secondary | ICD-10-CM | POA: Diagnosis not present

## 2019-09-07 DIAGNOSIS — E785 Hyperlipidemia, unspecified: Secondary | ICD-10-CM

## 2019-09-07 DIAGNOSIS — M810 Age-related osteoporosis without current pathological fracture: Secondary | ICD-10-CM

## 2019-09-07 LAB — CBC WITH DIFFERENTIAL/PLATELET
Basophils Absolute: 0 10*3/uL (ref 0.0–0.2)
Basos: 0 %
EOS (ABSOLUTE): 0.2 10*3/uL (ref 0.0–0.4)
Eos: 2 %
Hematocrit: 48.5 % — ABNORMAL HIGH (ref 34.0–46.6)
Hemoglobin: 16.3 g/dL — ABNORMAL HIGH (ref 11.1–15.9)
Immature Grans (Abs): 0 10*3/uL (ref 0.0–0.1)
Immature Granulocytes: 0 %
Lymphocytes Absolute: 2.3 10*3/uL (ref 0.7–3.1)
Lymphs: 22 %
MCH: 30.5 pg (ref 26.6–33.0)
MCHC: 33.6 g/dL (ref 31.5–35.7)
MCV: 91 fL (ref 79–97)
Monocytes Absolute: 0.9 10*3/uL (ref 0.1–0.9)
Monocytes: 9 %
Neutrophils Absolute: 6.8 10*3/uL (ref 1.4–7.0)
Neutrophils: 67 %
Platelets: 167 10*3/uL (ref 150–450)
RBC: 5.35 x10E6/uL — ABNORMAL HIGH (ref 3.77–5.28)
RDW: 13.8 % (ref 11.7–15.4)
WBC: 10.2 10*3/uL (ref 3.4–10.8)

## 2019-09-07 LAB — CMP14+EGFR
ALT: 39 IU/L — ABNORMAL HIGH (ref 0–32)
AST: 34 IU/L (ref 0–40)
Albumin/Globulin Ratio: 1.6 (ref 1.2–2.2)
Albumin: 4.4 g/dL (ref 3.7–4.7)
Alkaline Phosphatase: 91 IU/L (ref 48–121)
BUN/Creatinine Ratio: 23 (ref 12–28)
BUN: 21 mg/dL (ref 8–27)
Bilirubin Total: 0.4 mg/dL (ref 0.0–1.2)
CO2: 25 mmol/L (ref 20–29)
Calcium: 10.1 mg/dL (ref 8.7–10.3)
Chloride: 99 mmol/L (ref 96–106)
Creatinine, Ser: 0.92 mg/dL (ref 0.57–1.00)
GFR calc Af Amer: 72 mL/min/{1.73_m2} (ref >59)
GFR calc non Af Amer: 62 mL/min/{1.73_m2} (ref >59)
Globulin, Total: 2.8 g/dL (ref 1.5–4.5)
Glucose: 146 mg/dL — ABNORMAL HIGH (ref 65–99)
Potassium: 4.8 mmol/L (ref 3.5–5.2)
Sodium: 140 mmol/L (ref 134–144)
Total Protein: 7.2 g/dL (ref 6.0–8.5)

## 2019-09-07 LAB — LIPID PANEL
Chol/HDL Ratio: 7.9 ratio — ABNORMAL HIGH (ref 0.0–4.4)
Cholesterol, Total: 228 mg/dL — ABNORMAL HIGH (ref 100–199)
HDL: 29 mg/dL — ABNORMAL LOW (ref >39)
LDL Chol Calc (NIH): 112 mg/dL — ABNORMAL HIGH (ref 0–99)
Triglycerides: 500 mg/dL — ABNORMAL HIGH (ref 0–149)
VLDL Cholesterol Cal: 87 mg/dL — ABNORMAL HIGH (ref 5–40)

## 2019-09-07 LAB — BAYER DCA HB A1C WAIVED: HB A1C (BAYER DCA - WAIVED): 6.5 % (ref <7.0)

## 2019-09-07 MED ORDER — TELMISARTAN-HCTZ 80-25 MG PO TABS: 1.0000 | ORAL_TABLET | Freq: Every morning | ORAL | 3 refills | Status: DC

## 2019-09-07 MED ORDER — CELECOXIB 200 MG PO CAPS
ORAL_CAPSULE | ORAL | 3 refills | Status: DC
Start: 2019-09-07 — End: 2020-09-21

## 2019-09-07 MED ORDER — OMEPRAZOLE 40 MG PO CPDR: 40.0000 mg | DELAYED_RELEASE_CAPSULE | Freq: Every day | ORAL | 3 refills | Status: DC

## 2019-09-07 MED ORDER — CLONIDINE HCL 0.1 MG PO TABS: 0.1000 mg | ORAL_TABLET | Freq: Two times a day (BID) | ORAL | 1 refills | Status: DC

## 2019-09-07 MED ORDER — EMPAGLIFLOZIN 25 MG PO TABS: 25.0000 mg | ORAL_TABLET | Freq: Every day | ORAL | 1 refills | Status: DC

## 2019-09-07 MED ORDER — ATORVASTATIN CALCIUM 40 MG PO TABS
40.0000 mg | ORAL_TABLET | Freq: Every day | ORAL | 3 refills | Status: DC
Start: 2019-09-07 — End: 2019-09-08

## 2019-09-07 MED ORDER — GLIMEPIRIDE 4 MG PO TABS: 4.0000 mg | ORAL_TABLET | Freq: Two times a day (BID) | ORAL | 1 refills | Status: DC

## 2019-09-07 MED ORDER — TRULICITY 0.75 MG/0.5ML ~~LOC~~ SOAJ: 0.7500 mg | SUBCUTANEOUS | 3 refills | Status: DC

## 2019-09-07 NOTE — Progress Notes (Signed)
Subjective:  Patient ID: Rita Payne,  female    DOB: 19-Jul-1945  Age: 74 y.o.    CC: Medical Management of Chronic Issues   HPI Rita Payne presents for  follow-up of hypertension. Patient has no history of headache chest pain or shortness of breath or recent cough. Patient also denies symptoms of TIA such as numbness weakness lateralizing. Patient denies side effects from medication. States taking it regularly.  Patient also  in for follow-up of elevated cholesterol. Doing well without complaints on current medication. Denies side effects  including myalgia and arthralgia and nausea. Also in today for liver function testing. Currently no chest pain, shortness of breath or other cardiovascular related symptoms noted.  Follow-up of diabetes. Patient does check blood sugar at home. Readings run between 120 and 150 Patient denies symptoms such as excessive hunger or urinary frequency, excessive hunger, nausea No significant hypoglycemic spells noted. Medications reviewed. Pt reports taking them regularly. Pt. denies complication/adverse reaction today.   Patient in for follow-up of GERD. Currently asymptomatic taking  PPI daily. There is no chest pain or heartburn. No hematemesis and no melena. No dysphagia or choking. Onset is remote. Progression is stable. Complicating factors, none.    History Rita Payne has a past medical history of Allergy, Arthritis, Basal cell carcinoma, Cataract, Diarrhea, First degree heart block, GERD (gastroesophageal reflux disease), History of kidney stones, Hyperlipidemia, Hypertension, Leg fracture, Melanoma ([place]), Mild intermittent asthma, SUI (stress urinary incontinence, female), and Type 2 diabetes mellitus ([place]).   She has a past surgical history that includes Tonsillectomy; Abdominal hysterectomy (1987); Cholecystectomy (1989); Knee arthroscopy (Bilateral); Hernia repair; kidney tumor removal  (1986); Blepharoptosis repair (Bilateral, 2009); Total hip  arthroplasty (Right, 08/25/2012); Lumbar laminectomy/decompression microdiscectomy (N/A, 12/08/2013); Pubovaginal sling (N/A, 10/03/2014); and Replacement total hip w/  resurfacing implants (Right).   Her family history includes Arrhythmia in her brother; Arthritis/Rheumatoid in her brother and sister; Colon polyps in her sister and sister; Diabetes in her mother, sister, and sister; Heart attack in her brother and mother; Heart disease in her brother, father, mother, and sister; Heart failure in her brother and sister; Stroke in her father, mother, and sister; Vision loss in her brother.She reports that she has been smoking cigarettes. She has a 8.25 pack-year smoking history. She has never used smokeless tobacco. She reports that she does not drink alcohol and does not use drugs.  Current Outpatient Medications on File Prior to Visit  Medication Sig Dispense Refill   albuterol (PROAIR HFA) 108 (90 Base) MCG/ACT inhaler Inhale 2 puffs into the lungs every 6 (six) hours as needed for wheezing or shortness of breath. Pt needs for chronic asthma 25.5 g 4   aspirin 81 MG tablet Take 1 tablet (81 mg total) by mouth daily. Resume 4 days post-op 30 tablet    cetirizine (ZYRTEC) 10 MG tablet TAKE 1 TABLET DAILY 90 tablet 3   cholecalciferol (VITAMIN D) 1000 UNITS tablet Take 1,000 Units by mouth daily.     diclofenac Sodium (VOLTAREN) 1 % GEL Apply 2 g topically 4 (four) times daily. 100 g 2   Estradiol (VAGIFEM) 10 MCG TABS vaginal tablet Place 1 tablet (10 mcg total) vaginally 2 (two) times a week. 24 tablet 1   Fluticasone-Salmeterol (ADVAIR DISKUS) 250-50 MCG/DOSE AEPB USE 1 INHALATION EVERY 12 HOURS 180 each 3   glucose blood (FREESTYLE LITE) test strip Test qd. DX: E11.9 100 each 3   Current Facility-Administered Medications on File Prior to Visit  Medication Dose  Route Frequency Provider Last Rate Last Admin   magnesium citrate solution 1 Bottle  1 Bottle Oral Once Carolan Clines, MD         sodium phosphate (FLEET) 7-19 GM/118ML enema 1 enema  1 enema Rectal Once Carolan Clines, MD        ROS Review of Systems  Constitutional: Negative.   HENT: Negative for congestion.   Eyes: Negative for visual disturbance.  Respiratory: Negative for shortness of breath.   Cardiovascular: Negative for chest pain.  Gastrointestinal: Negative for abdominal pain, constipation, diarrhea, nausea and vomiting.  Genitourinary: Negative for difficulty urinating.  Musculoskeletal: Positive for arthralgias. Negative for myalgias.  Neurological: Negative for headaches.  Psychiatric/Behavioral: Negative for sleep disturbance.    Objective:  BP 114/66    Pulse 98    Temp (!) 97.4 F (36.3 C) (Temporal)    Resp 20    Ht 5' 4" (1.626 m)    Wt 182 lb 2 oz (82.6 kg)    LMP 04/14/1982    SpO2 95%    BMI 31.26 kg/m   BP Readings from Last 3 Encounters:  09/07/19 114/66  08/27/19 96/63  06/03/19 132/75    Wt Readings from Last 3 Encounters:  09/07/19 182 lb 2 oz (82.6 kg)  08/27/19 180 lb (81.6 kg)  08/20/19 180 lb (81.6 kg)     Physical Exam Constitutional:      General: She is not in acute distress.    Appearance: She is well-developed.  HENT:     Head: Normocephalic and atraumatic.     Right Ear: External ear normal.     Left Ear: External ear normal.     Nose: Nose normal.  Eyes:     Conjunctiva/sclera: Conjunctivae normal.     Pupils: Pupils are equal, round, and reactive to light.  Neck:     Thyroid: No thyromegaly.  Cardiovascular:     Rate and Rhythm: Normal rate and regular rhythm.     Heart sounds: Normal heart sounds. No murmur heard.   Pulmonary:     Effort: Pulmonary effort is normal. No respiratory distress.     Breath sounds: Normal breath sounds. No wheezing or rales.  Abdominal:     General: Bowel sounds are normal. There is no distension.     Palpations: Abdomen is soft.     Tenderness: There is no abdominal tenderness.  Musculoskeletal:     Cervical  back: Normal range of motion and neck supple.  Lymphadenopathy:     Cervical: No cervical adenopathy.  Skin:    General: Skin is warm and dry.  Neurological:     Mental Status: She is alert and oriented to person, place, and time.     Deep Tendon Reflexes: Reflexes are normal and symmetric.  Psychiatric:        Behavior: Behavior normal.        Thought Content: Thought content normal.        Judgment: Judgment normal.     Diabetic Foot Exam - Simple   No data filed        Assessment & Plan:   Rita Payne was seen today for medical management of chronic issues.  Diagnoses and all orders for this visit:  Type 2 diabetes mellitus without complication, without long-term current use of insulin ([place]) -     Bayer DCA Hb A1c Waived -     CBC with Differential/Platelet -     CMP14+EGFR -     Lipid panel -  Dulaglutide (TRULICITY) 2.09 OB/0.9GG SOPN; Inject 0.5 mLs (0.75 mg total) into the skin once a week. -     empagliflozin (JARDIANCE) 25 MG TABS tablet; Take 1 tablet (25 mg total) by mouth daily before breakfast. -     glimepiride (AMARYL) 4 MG tablet; Take 1 tablet (4 mg total) by mouth 2 (two) times daily.  Hyperlipidemia associated with type 2 diabetes mellitus ([place]) -     CBC with Differential/Platelet -     CMP14+EGFR -     Lipid panel  Hypertension associated with type 2 diabetes mellitus ([facility]) -     CBC with Differential/Platelet -     CMP14+EGFR -     Lipid panel  Mild intermittent chronic asthma without complication  Arthritis  Senile osteoporosis -     DG Bone Density; Future  Essential hypertension -     cloNIDine (CATAPRES) 0.1 MG tablet; Take 1 tablet (0.1 mg total) by mouth 2 (two) times daily. -     telmisartan-hydrochlorothiazide (MICARDIS HCT) 80-25 MG tablet; Take 1 tablet by mouth every morning.  Gastroesophageal reflux disease without esophagitis -     omeprazole (PRILOSEC) 40 MG capsule; Take 1 capsule (40 mg total) by mouth daily.  Other  orders -     atorvastatin (LIPITOR) 40 MG tablet; Take 1 tablet (40 mg total) by mouth daily. -     celecoxib (CELEBREX) 200 MG capsule; TAKE 1 CAPSULE EVERY 12 HOURS   I have changed Rita Look Warga "Sandy"'s Micardis HCT to telmisartan-hydrochlorothiazide. I have also changed her atorvastatin, cloNIDine, and empagliflozin. I am also having her maintain her cholecalciferol, aspirin, albuterol, Fluticasone-Salmeterol, cetirizine, FREESTYLE LITE, diclofenac Sodium, Estradiol, celecoxib, Trulicity, glimepiride, and omeprazole.  Meds ordered this encounter  Medications   atorvastatin (LIPITOR) 40 MG tablet    Sig: Take 1 tablet (40 mg total) by mouth daily.    Dispense:  90 tablet    Refill:  3   celecoxib (CELEBREX) 200 MG capsule    Sig: TAKE 1 CAPSULE EVERY 12 HOURS    Dispense:  180 capsule    Refill:  3   cloNIDine (CATAPRES) 0.1 MG tablet    Sig: Take 1 tablet (0.1 mg total) by mouth 2 (two) times daily.    Dispense:  180 tablet    Refill:  1   Dulaglutide (TRULICITY) 8.36 OQ/9.4TM SOPN    Sig: Inject 0.5 mLs (0.75 mg total) into the skin once a week.    Dispense:  6 mL    Refill:  3    Lot [id number]  Exp 01/17/2021   empagliflozin (JARDIANCE) 25 MG TABS tablet    Sig: Take 1 tablet (25 mg total) by mouth daily before breakfast.    Dispense:  90 tablet    Refill:  1   glimepiride (AMARYL) 4 MG tablet    Sig: Take 1 tablet (4 mg total) by mouth 2 (two) times daily.    Dispense:  180 tablet    Refill:  1   telmisartan-hydrochlorothiazide (MICARDIS HCT) 80-25 MG tablet    Sig: Take 1 tablet by mouth every morning.    Dispense:  90 tablet    Refill:  3    YOUR PATIENT HAS REQUESTED A REFILL OF THIS MEDICATION, PREVIOUSLY AUTHORIZED BY ANOTHER PRESCRIBER.   omeprazole (PRILOSEC) 40 MG capsule    Sig: Take 1 capsule (40 mg total) by mouth daily.    Dispense:  90 capsule    Refill:  3  Follow-up: Return in about 3 months (around 12/07/2019).  Claretta Fraise, M.D.

## 2019-09-08 ENCOUNTER — Other Ambulatory Visit: Payer: Self-pay | Admitting: Family Medicine

## 2019-09-08 MED ORDER — ATORVASTATIN CALCIUM 80 MG PO TABS
80.0000 mg | ORAL_TABLET | Freq: Every day | ORAL | 3 refills | Status: DC
Start: 2019-09-08 — End: 2020-03-08

## 2019-09-08 MED ORDER — ATORVASTATIN CALCIUM 80 MG PO TABS
80.0000 mg | ORAL_TABLET | Freq: Every day | ORAL | 3 refills | Status: DC
Start: 2019-09-08 — End: 2019-09-08

## 2019-09-08 NOTE — Addendum Note (Signed)
Addended by: Brynda Peon F on: 09/08/2019 02:19 PM   Modules accepted: Orders

## 2019-09-14 ENCOUNTER — Other Ambulatory Visit: Payer: Self-pay | Admitting: Nurse Practitioner

## 2019-10-11 ENCOUNTER — Telehealth: Payer: Self-pay

## 2019-10-11 NOTE — Telephone Encounter (Signed)
Thank you for the update! I'm glad she's doing well.

## 2019-10-11 NOTE — Chronic Care Management (AMB) (Signed)
  Care Management   Note  10/11/2019 Name: Jenisa Monty MRN: [id number] DOB: 24-Oct-1945  Alanah Sakuma is a 74 y.o. year old female who is a primary care patient of Stacks, Cletus Gash, MD and is actively engaged with the care management team. I reached out to [name] by phone today to assist with re-scheduling a follow up visit with the RN Case Manager  Follow up plan: Patient declines further follow up and engagement by the care management team. Appropriate care team members and provider have been notified via electronic communication.   Noreene Larsson, [address], [place], [place] [address] Direct Dial: [phone] [email or web address]: [email or web address]

## 2019-10-11 NOTE — Telephone Encounter (Signed)
Spoke with Pt and she states that she is doing well and not having any problems at this time does not want to schedule a f/u

## 2019-10-20 ENCOUNTER — Other Ambulatory Visit: Payer: Self-pay

## 2019-10-20 ENCOUNTER — Ambulatory Visit (INDEPENDENT_AMBULATORY_CARE_PROVIDER_SITE_OTHER): Payer: Medicare Other

## 2019-10-20 ENCOUNTER — Encounter: Payer: Self-pay | Admitting: Family Medicine

## 2019-10-20 ENCOUNTER — Ambulatory Visit (INDEPENDENT_AMBULATORY_CARE_PROVIDER_SITE_OTHER): Payer: Medicare Other | Admitting: Family Medicine

## 2019-10-20 VITALS — BP 122/74 | HR 88 | Temp 98.2°F | Ht 64.0 in | Wt 184.0 lb

## 2019-10-20 DIAGNOSIS — M79672 Pain in left foot: Secondary | ICD-10-CM

## 2019-10-20 DIAGNOSIS — S99922A Unspecified injury of left foot, initial encounter: Secondary | ICD-10-CM | POA: Diagnosis not present

## 2019-10-20 DIAGNOSIS — M7732 Calcaneal spur, left foot: Secondary | ICD-10-CM | POA: Diagnosis not present

## 2019-10-20 NOTE — Progress Notes (Signed)
Chief Complaint  Patient presents with  . Foot Pain  . Injury to left foot    HPI  Patient presents today for continued pain in the left dorsal forefoot ever since a heavy box fell on the foot 6 weeks ago.  He had been swelling but that has started to diminish over the last 2 weeks.  Pain is moderate.  Does affect her ambulation.  PMH: Smoking status noted ROS: Per HPI  Objective: BP 122/74   Pulse 88   Temp 98.2 F (36.8 C) (Temporal)   Ht 5\' 4"  (1.626 m)   Wt 184 lb (83.5 kg)   LMP 04/14/1982   BMI 31.58 kg/m  Gen: NAD, alert, cooperative with exam HEENT: NCAT, EOMI, PERR  Ext: No edema, warm.  There is tenderness at the dorsal midfoot at the base of the second and third metatarsal. Neuro: Alert and oriented, No gross deficits Left foot x-ray: No fracture or acute abnormality noted. Assessment and plan:  1. Left foot pain   2. Injury of left foot, initial encounter     Patient has pain extending back to the anterior ankle therefore she was placed in an ASO brace as well as a postop shoe.  She should wear those for 2 to 4 weeks.  Orders Placed This Encounter  Procedures  . DG Foot Complete Left    Standing Status:   Future    Number of Occurrences:   1    Standing Expiration Date:   10/19/2020    Order Specific Question:   Reason for Exam (SYMPTOM  OR DIAGNOSIS REQUIRED)    Answer:   Left foot pain, Injury to left foot    Order Specific Question:   Preferred imaging location?    Answer:   Internal    Follow up as needed.  Claretta Fraise, MD

## 2019-10-24 ENCOUNTER — Encounter: Payer: Self-pay | Admitting: Family Medicine

## 2019-10-28 ENCOUNTER — Other Ambulatory Visit: Payer: Self-pay | Admitting: Family Medicine

## 2019-10-28 DIAGNOSIS — E119 Type 2 diabetes mellitus without complications: Secondary | ICD-10-CM

## 2019-11-16 DIAGNOSIS — Z23 Encounter for immunization: Secondary | ICD-10-CM | POA: Diagnosis not present

## 2019-11-22 ENCOUNTER — Other Ambulatory Visit: Payer: Self-pay

## 2019-11-22 ENCOUNTER — Encounter: Payer: Self-pay | Admitting: Family Medicine

## 2019-11-22 ENCOUNTER — Ambulatory Visit (INDEPENDENT_AMBULATORY_CARE_PROVIDER_SITE_OTHER): Payer: Medicare Other

## 2019-11-22 ENCOUNTER — Ambulatory Visit (INDEPENDENT_AMBULATORY_CARE_PROVIDER_SITE_OTHER): Payer: Medicare Other | Admitting: Family Medicine

## 2019-11-22 ENCOUNTER — Other Ambulatory Visit: Payer: Self-pay | Admitting: Family Medicine

## 2019-11-22 VITALS — BP 99/65 | HR 99 | Temp 97.7°F | Ht 64.0 in | Wt 185.2 lb

## 2019-11-22 DIAGNOSIS — M810 Age-related osteoporosis without current pathological fracture: Secondary | ICD-10-CM

## 2019-11-22 DIAGNOSIS — M67472 Ganglion, left ankle and foot: Secondary | ICD-10-CM | POA: Diagnosis not present

## 2019-11-22 DIAGNOSIS — S9032XD Contusion of left foot, subsequent encounter: Secondary | ICD-10-CM | POA: Diagnosis not present

## 2019-11-22 DIAGNOSIS — M85852 Other specified disorders of bone density and structure, left thigh: Secondary | ICD-10-CM | POA: Diagnosis not present

## 2019-11-22 NOTE — Progress Notes (Signed)
Chief Complaint  Patient presents with  . Follow-up    Left foot    HPI  Patient presents today for recheck of the left foot.  She has been carefully following instructions with regard to using the brace daily.  She keeps it on all the time with the exception of when she is showering.  She showers she puts on some lotion puts the ASO and the postop shoe back on.  She says the pain has gone.  She is ready to get out of the brace.  However, there is some abnormality left that she is curious about.  PMH: Smoking status noted ROS: Per HPI  Objective: BP 99/65   Pulse 99   Temp 97.7 F (36.5 C) (Temporal)   Ht 5\' 4"  (1.626 m)   Wt 185 lb 3.2 oz (84 kg)   LMP 04/14/1982   BMI 31.79 kg/m  Gen: NAD, alert, cooperative with exam HEENT: NCAT, Ext: No edema, warm.  There is a small dorsal ganglion at the midfoot.  It is nontender.  It is rather spongy.  There is no erythema. Neuro: Alert and oriented, No gross deficits  Assessment and plan:  1. Contusion of left foot, subsequent encounter   2. Ganglion cyst of left foot     Okay to resume normal footwear.  Range of motion exercises reviewed with the patient.  Follow-up as per usual for diabetes care.  As needed for the foot.    Follow up two weeks for DM care, and as needed.  Claretta Fraise, MD

## 2019-12-07 ENCOUNTER — Telehealth: Payer: Self-pay | Admitting: *Deleted

## 2019-12-07 ENCOUNTER — Encounter: Payer: Self-pay | Admitting: Family Medicine

## 2019-12-07 ENCOUNTER — Other Ambulatory Visit: Payer: Medicare Other

## 2019-12-07 ENCOUNTER — Ambulatory Visit (INDEPENDENT_AMBULATORY_CARE_PROVIDER_SITE_OTHER): Payer: Medicare Other | Admitting: Family Medicine

## 2019-12-07 ENCOUNTER — Other Ambulatory Visit: Payer: Self-pay

## 2019-12-07 VITALS — BP 118/67 | HR 83 | Temp 97.6°F | Ht 64.0 in | Wt 182.0 lb

## 2019-12-07 DIAGNOSIS — E785 Hyperlipidemia, unspecified: Secondary | ICD-10-CM | POA: Diagnosis not present

## 2019-12-07 DIAGNOSIS — E559 Vitamin D deficiency, unspecified: Secondary | ICD-10-CM | POA: Diagnosis not present

## 2019-12-07 DIAGNOSIS — I152 Hypertension secondary to endocrine disorders: Secondary | ICD-10-CM

## 2019-12-07 DIAGNOSIS — E1169 Type 2 diabetes mellitus with other specified complication: Secondary | ICD-10-CM

## 2019-12-07 DIAGNOSIS — Z780 Asymptomatic menopausal state: Secondary | ICD-10-CM | POA: Diagnosis not present

## 2019-12-07 DIAGNOSIS — I10 Essential (primary) hypertension: Secondary | ICD-10-CM

## 2019-12-07 DIAGNOSIS — E119 Type 2 diabetes mellitus without complications: Secondary | ICD-10-CM

## 2019-12-07 DIAGNOSIS — Z23 Encounter for immunization: Secondary | ICD-10-CM | POA: Diagnosis not present

## 2019-12-07 DIAGNOSIS — E1159 Type 2 diabetes mellitus with other circulatory complications: Secondary | ICD-10-CM

## 2019-12-07 DIAGNOSIS — J4520 Mild intermittent asthma, uncomplicated: Secondary | ICD-10-CM

## 2019-12-07 LAB — BAYER DCA HB A1C WAIVED: HB A1C (BAYER DCA - WAIVED): 6.7 % (ref <7.0)

## 2019-12-07 LAB — LIPID PANEL

## 2019-12-07 NOTE — Telephone Encounter (Signed)
Called and informed patient of A1C 6.7 and to stop evening dose of Glimepiride per Dr. Livia Snellen.  Patient verbalized understanding.

## 2019-12-07 NOTE — Progress Notes (Signed)
Subjective:  Patient ID: Rita Payne, female    DOB: 03/06/1945  Age: 74 y.o. MRN: [id number]  CC: Follow-up (3 month)   HPI Rita Payne presents forFollow-up of diabetes. Patient checks blood sugar at home.   130-150 fasting and postprandial Patient denies symptoms such as polyuria, polydipsia, excessive hunger, nausea No significant hypoglycemic spells noted. Medications reviewed. Pt reports taking them regularly without complication/adverse reaction being reported today.   She is concerned about arthralgias at hands, knees, hips. She describes that as soreness, but gets improvement with the celebrex.   History Rita Payne has a past medical history of Allergy, Arthritis, Basal cell carcinoma, Cataract, Diarrhea, First degree heart block, GERD (gastroesophageal reflux disease), History of kidney stones, Hyperlipidemia, Hypertension, Leg fracture, Melanoma ([place]), Mild intermittent asthma, SUI (stress urinary incontinence, female), and Type 2 diabetes mellitus ([place]).   She has a past surgical history that includes Tonsillectomy; Abdominal hysterectomy (1987); Cholecystectomy (1989); Knee arthroscopy (Bilateral); Hernia repair; kidney tumor removal  (1986); Blepharoptosis repair (Bilateral, 2009); Total hip arthroplasty (Right, 08/25/2012); Lumbar laminectomy/decompression microdiscectomy (N/A, 12/08/2013); Pubovaginal sling (N/A, 10/03/2014); and Replacement total hip w/  resurfacing implants (Right).   Her family history includes Arrhythmia in her brother; Arthritis/Rheumatoid in her brother and sister; Colon polyps in her sister and sister; Diabetes in her mother, sister, and sister; Heart attack in her brother and mother; Heart disease in her brother, father, mother, and sister; Heart failure in her brother and sister; Stroke in her father, mother, and sister; Vision loss in her brother.She reports that she has been smoking cigarettes. She has a 8.25 pack-year smoking history. She has never used  smokeless tobacco. She reports that she does not drink alcohol and does not use drugs.  Current Outpatient Medications on File Prior to Visit  Medication Sig Dispense Refill  . aspirin 81 MG tablet Take 1 tablet (81 mg total) by mouth daily. Resume 4 days post-op 30 tablet   . atorvastatin (LIPITOR) 80 MG tablet Take 1 tablet (80 mg total) by mouth daily. 90 tablet 3  . celecoxib (CELEBREX) 200 MG capsule TAKE 1 CAPSULE EVERY 12 HOURS 180 capsule 3  . cetirizine (ZYRTEC) 10 MG tablet TAKE 1 TABLET DAILY 90 tablet 3  . cholecalciferol (VITAMIN D) 1000 UNITS tablet Take 1,000 Units by mouth daily.    . diclofenac Sodium (VOLTAREN) 1 % GEL Apply 2 g topically 4 (four) times daily. 100 g 2  . empagliflozin (JARDIANCE) 25 MG TABS tablet Take 1 tablet (25 mg total) by mouth daily before breakfast. 90 tablet 1  . glimepiride (AMARYL) 4 MG tablet Take 1 tablet (4 mg total) by mouth 2 (two) times daily. 180 tablet 1  . glucose blood (FREESTYLE LITE) test strip Test qd. DX: E11.9 100 each 3  . omeprazole (PRILOSEC) 40 MG capsule Take 1 capsule (40 mg total) by mouth daily. 90 capsule 3  . telmisartan-hydrochlorothiazide (MICARDIS HCT) 80-25 MG tablet Take 1 tablet by mouth every morning. 90 tablet 3   Current Facility-Administered Medications on File Prior to Visit  Medication Dose Route Frequency Provider Last Rate Last Admin  . magnesium citrate solution 1 Bottle  1 Bottle Oral Once Carolan Clines, MD      . sodium phosphate (FLEET) 7-19 GM/118ML enema 1 enema  1 enema Rectal Once Carolan Clines, MD        ROS Review of Systems  Constitutional: Negative.   HENT: Negative.   Eyes: Negative for visual disturbance.  Respiratory: Negative for shortness of  breath.   Cardiovascular: Negative for chest pain.  Gastrointestinal: Negative for abdominal pain.  Musculoskeletal: Negative for arthralgias.    Objective:  BP 118/67   Pulse 83   Temp 97.6 F (36.4 C) (Temporal)   Ht $R'5\' 4"'mC$   (1.626 m)   Wt 182 lb (82.6 kg)   LMP 04/14/1982   BMI 31.24 kg/m   BP Readings from Last 3 Encounters:  12/07/19 118/67  11/22/19 99/65  10/20/19 122/74    Wt Readings from Last 3 Encounters:  12/07/19 182 lb (82.6 kg)  11/22/19 185 lb 3.2 oz (84 kg)  10/20/19 184 lb (83.5 kg)     Physical Exam Constitutional:      General: She is not in acute distress.    Appearance: She is well-developed.  HENT:     Head: Normocephalic and atraumatic.  Eyes:     Conjunctiva/sclera: Conjunctivae normal.     Pupils: Pupils are equal, round, and reactive to light.  Neck:     Thyroid: No thyromegaly.  Cardiovascular:     Rate and Rhythm: Normal rate and regular rhythm.     Heart sounds: Normal heart sounds. No murmur heard.   Pulmonary:     Effort: Pulmonary effort is normal. No respiratory distress.     Breath sounds: Normal breath sounds. No wheezing or rales.  Abdominal:     General: Bowel sounds are normal. There is no distension.     Palpations: Abdomen is soft.     Tenderness: There is no abdominal tenderness.  Musculoskeletal:        General: Normal range of motion.     Cervical back: Normal range of motion and neck supple.  Lymphadenopathy:     Cervical: No cervical adenopathy.  Skin:    General: Skin is warm and dry.  Neurological:     Mental Status: She is alert and oriented to person, place, and time.  Psychiatric:        Behavior: Behavior normal.        Thought Content: Thought content normal.        Judgment: Judgment normal.       Assessment & Plan:   Rita Payne was seen today for follow-up.  Diagnoses and all orders for this visit:  Type 2 diabetes mellitus without complication, without long-term current use of insulin (HCC) -     CBC with Differential/Platelet -     CMP14+EGFR -     Bayer DCA Hb A1c Waived -     Dulaglutide (TRULICITY) 6.26 RS/8.5IO SOPN; INJECT 0.5 ML (0.75 MG TOTAL) INTO THE SKIN ONCE A WEEK  Hyperlipidemia associated with type 2  diabetes mellitus (HCC) -     CBC with Differential/Platelet -     CMP14+EGFR -     Lipid panel  Hypertension associated with type 2 diabetes mellitus (HCC) -     CBC with Differential/Platelet -     CMP14+EGFR  Vitamin D deficiency -     CBC with Differential/Platelet -     CMP14+EGFR -     VITAMIN D 25 Hydroxy (Vit-D Deficiency, Fractures)  Asthma, chronic, mild intermittent, uncomplicated -     Fluticasone-Salmeterol (ADVAIR DISKUS) 250-50 MCG/DOSE AEPB; USE 1 INHALATION EVERY 12 HOURS  Post-menopausal -     Estradiol (VAGIFEM) 10 MCG TABS vaginal tablet; Place 1 tablet (10 mcg total) vaginally 2 (two) times a week.  Essential hypertension -     cloNIDine (CATAPRES) 0.1 MG tablet; Take 1 tablet (0.1 mg total) by  mouth 2 (two) times daily.  Mild intermittent chronic asthma without complication -     albuterol (PROAIR HFA) 108 (90 Base) MCG/ACT inhaler; Inhale 2 puffs into the lungs every 6 (six) hours as needed for wheezing or shortness of breath. Pt needs for chronic asthma  Need for immunization against influenza -     Flu Vaccine QUAD High Dose(Fluad)      I have changed Rita Payne "[place]"'s Trulicity. I am also having her maintain her cholecalciferol, aspirin, FREESTYLE LITE, diclofenac Sodium, celecoxib, empagliflozin, glimepiride, telmisartan-hydrochlorothiazide, omeprazole, atorvastatin, cetirizine, Fluticasone-Salmeterol, Estradiol, cloNIDine, and albuterol.  Meds ordered this encounter  Medications  . Dulaglutide (TRULICITY) 5.40 GQ/6.7YP SOPN    Sig: INJECT 0.5 ML (0.75 MG TOTAL) INTO THE SKIN ONCE A WEEK    Dispense:  6 mL    Refill:  0  . Fluticasone-Salmeterol (ADVAIR DISKUS) 250-50 MCG/DOSE AEPB    Sig: USE 1 INHALATION EVERY 12 HOURS    Dispense:  180 each    Refill:  3  . Estradiol (VAGIFEM) 10 MCG TABS vaginal tablet    Sig: Place 1 tablet (10 mcg total) vaginally 2 (two) times a week.    Dispense:  24 tablet    Refill:  1  . cloNIDine (CATAPRES)  0.1 MG tablet    Sig: Take 1 tablet (0.1 mg total) by mouth 2 (two) times daily.    Dispense:  180 tablet    Refill:  1  . albuterol (PROAIR HFA) 108 (90 Base) MCG/ACT inhaler    Sig: Inhale 2 puffs into the lungs every 6 (six) hours as needed for wheezing or shortness of breath. Pt needs for chronic asthma    Dispense:  25.5 g    Refill:  4     Follow-up: Return in about 3 months (around 03/06/2020).  Claretta Fraise, M.D.

## 2019-12-08 DIAGNOSIS — L570 Actinic keratosis: Secondary | ICD-10-CM | POA: Diagnosis not present

## 2019-12-08 DIAGNOSIS — Z85828 Personal history of other malignant neoplasm of skin: Secondary | ICD-10-CM | POA: Diagnosis not present

## 2019-12-08 DIAGNOSIS — L821 Other seborrheic keratosis: Secondary | ICD-10-CM | POA: Diagnosis not present

## 2019-12-08 LAB — CMP14+EGFR
ALT: 31 IU/L (ref 0–32)
AST: 29 IU/L (ref 0–40)
Albumin/Globulin Ratio: 1.6 (ref 1.2–2.2)
Albumin: 4.6 g/dL (ref 3.7–4.7)
Alkaline Phosphatase: 87 IU/L (ref 44–121)
BUN/Creatinine Ratio: 20 (ref 12–28)
BUN: 17 mg/dL (ref 8–27)
Bilirubin Total: 0.4 mg/dL (ref 0.0–1.2)
CO2: 23 mmol/L (ref 20–29)
Calcium: 10 mg/dL (ref 8.7–10.3)
Chloride: 100 mmol/L (ref 96–106)
Creatinine, Ser: 0.85 mg/dL (ref 0.57–1.00)
GFR calc Af Amer: 79 mL/min/{1.73_m2} (ref >59)
GFR calc non Af Amer: 69 mL/min/{1.73_m2} (ref >59)
Globulin, Total: 2.8 g/dL (ref 1.5–4.5)
Glucose: 128 mg/dL — ABNORMAL HIGH (ref 65–99)
Potassium: 4 mmol/L (ref 3.5–5.2)
Sodium: 141 mmol/L (ref 134–144)
Total Protein: 7.4 g/dL (ref 6.0–8.5)

## 2019-12-08 LAB — CBC WITH DIFFERENTIAL/PLATELET
Basophils Absolute: 0 10*3/uL (ref 0.0–0.2)
Basos: 0 %
EOS (ABSOLUTE): 0.2 10*3/uL (ref 0.0–0.4)
Eos: 1 %
Hematocrit: 47 % — ABNORMAL HIGH (ref 34.0–46.6)
Hemoglobin: 15.9 g/dL (ref 11.1–15.9)
Immature Grans (Abs): 0 10*3/uL (ref 0.0–0.1)
Immature Granulocytes: 0 %
Lymphocytes Absolute: 2.4 10*3/uL (ref 0.7–3.1)
Lymphs: 23 %
MCH: 30.6 pg (ref 26.6–33.0)
MCHC: 33.8 g/dL (ref 31.5–35.7)
MCV: 91 fL (ref 79–97)
Monocytes Absolute: 0.9 10*3/uL (ref 0.1–0.9)
Monocytes: 9 %
Neutrophils Absolute: 7.1 10*3/uL — ABNORMAL HIGH (ref 1.4–7.0)
Neutrophils: 67 %
Platelets: 178 10*3/uL (ref 150–450)
RBC: 5.19 x10E6/uL (ref 3.77–5.28)
RDW: 14 % (ref 11.7–15.4)
WBC: 10.7 10*3/uL (ref 3.4–10.8)

## 2019-12-08 LAB — LIPID PANEL
Chol/HDL Ratio: 4.7 ratio — ABNORMAL HIGH (ref 0.0–4.4)
Cholesterol, Total: 165 mg/dL (ref 100–199)
HDL: 35 mg/dL — ABNORMAL LOW (ref >39)
LDL Chol Calc (NIH): 81 mg/dL (ref 0–99)
Triglycerides: 296 mg/dL — ABNORMAL HIGH (ref 0–149)
VLDL Cholesterol Cal: 49 mg/dL — ABNORMAL HIGH (ref 5–40)

## 2019-12-08 LAB — VITAMIN D 25 HYDROXY (VIT D DEFICIENCY, FRACTURES): Vit D, 25-Hydroxy: 33 ng/mL (ref 30.0–100.0)

## 2019-12-09 ENCOUNTER — Encounter: Payer: Self-pay | Admitting: Family Medicine

## 2019-12-09 MED ORDER — ALBUTEROL SULFATE HFA 108 (90 BASE) MCG/ACT IN AERS: 2.0000 | INHALATION_SPRAY | Freq: Four times a day (QID) | RESPIRATORY_TRACT | 4 refills | Status: DC | PRN

## 2019-12-09 MED ORDER — CLONIDINE HCL 0.1 MG PO TABS: 0.1000 mg | ORAL_TABLET | Freq: Two times a day (BID) | ORAL | 1 refills | Status: DC

## 2019-12-09 MED ORDER — ESTRADIOL 10 MCG VA TABS: 10.0000 ug | ORAL_TABLET | VAGINAL | 1 refills | Status: DC

## 2019-12-09 MED ORDER — FLUTICASONE-SALMETEROL 250-50 MCG/DOSE IN AEPB: INHALATION_SPRAY | RESPIRATORY_TRACT | 3 refills | Status: DC

## 2019-12-09 MED ORDER — TRULICITY 0.75 MG/0.5ML ~~LOC~~ SOAJ: SUBCUTANEOUS | 0 refills | Status: DC

## 2019-12-17 ENCOUNTER — Other Ambulatory Visit: Payer: Self-pay | Admitting: *Deleted

## 2019-12-17 DIAGNOSIS — E119 Type 2 diabetes mellitus without complications: Secondary | ICD-10-CM

## 2019-12-17 MED ORDER — FREESTYLE LITE TEST VI STRP: ORAL_STRIP | 3 refills | Status: DC

## 2020-03-01 ENCOUNTER — Other Ambulatory Visit: Payer: Self-pay | Admitting: Family Medicine

## 2020-03-01 DIAGNOSIS — E119 Type 2 diabetes mellitus without complications: Secondary | ICD-10-CM

## 2020-03-06 ENCOUNTER — Other Ambulatory Visit: Payer: Self-pay | Admitting: Family Medicine

## 2020-03-06 DIAGNOSIS — E119 Type 2 diabetes mellitus without complications: Secondary | ICD-10-CM

## 2020-03-08 ENCOUNTER — Ambulatory Visit (INDEPENDENT_AMBULATORY_CARE_PROVIDER_SITE_OTHER): Payer: Medicare Other | Admitting: Family Medicine

## 2020-03-08 ENCOUNTER — Encounter: Payer: Self-pay | Admitting: Family Medicine

## 2020-03-08 ENCOUNTER — Other Ambulatory Visit: Payer: Self-pay

## 2020-03-08 VITALS — BP 104/65 | HR 81 | Temp 97.3°F | Ht 64.0 in | Wt 181.0 lb

## 2020-03-08 DIAGNOSIS — Z780 Asymptomatic menopausal state: Secondary | ICD-10-CM

## 2020-03-08 DIAGNOSIS — K219 Gastro-esophageal reflux disease without esophagitis: Secondary | ICD-10-CM | POA: Diagnosis not present

## 2020-03-08 DIAGNOSIS — E785 Hyperlipidemia, unspecified: Secondary | ICD-10-CM

## 2020-03-08 DIAGNOSIS — E1169 Type 2 diabetes mellitus with other specified complication: Secondary | ICD-10-CM

## 2020-03-08 DIAGNOSIS — R202 Paresthesia of skin: Secondary | ICD-10-CM

## 2020-03-08 DIAGNOSIS — I10 Essential (primary) hypertension: Secondary | ICD-10-CM | POA: Diagnosis not present

## 2020-03-08 DIAGNOSIS — E1159 Type 2 diabetes mellitus with other circulatory complications: Secondary | ICD-10-CM | POA: Diagnosis not present

## 2020-03-08 DIAGNOSIS — E119 Type 2 diabetes mellitus without complications: Secondary | ICD-10-CM | POA: Diagnosis not present

## 2020-03-08 DIAGNOSIS — E559 Vitamin D deficiency, unspecified: Secondary | ICD-10-CM | POA: Diagnosis not present

## 2020-03-08 DIAGNOSIS — I152 Hypertension secondary to endocrine disorders: Secondary | ICD-10-CM | POA: Diagnosis not present

## 2020-03-08 DIAGNOSIS — J4520 Mild intermittent asthma, uncomplicated: Secondary | ICD-10-CM | POA: Diagnosis not present

## 2020-03-08 LAB — BAYER DCA HB A1C WAIVED: HB A1C (BAYER DCA - WAIVED): 6.4 % (ref <7.0)

## 2020-03-08 MED ORDER — ESTRADIOL 10 MCG VA TABS: 10.0000 ug | ORAL_TABLET | VAGINAL | 1 refills | Status: DC

## 2020-03-08 MED ORDER — ATORVASTATIN CALCIUM 80 MG PO TABS: 80.0000 mg | ORAL_TABLET | Freq: Every day | ORAL | 3 refills | Status: DC

## 2020-03-08 MED ORDER — CLONIDINE HCL 0.1 MG PO TABS: 0.1000 mg | ORAL_TABLET | Freq: Two times a day (BID) | ORAL | 1 refills | Status: DC

## 2020-03-08 MED ORDER — GLIMEPIRIDE 4 MG PO TABS: 4.0000 mg | ORAL_TABLET | Freq: Two times a day (BID) | ORAL | 2 refills | Status: DC

## 2020-03-08 MED ORDER — EMPAGLIFLOZIN 25 MG PO TABS: 25.0000 mg | ORAL_TABLET | Freq: Every day | ORAL | 1 refills | Status: DC

## 2020-03-08 MED ORDER — TELMISARTAN-HCTZ 80-25 MG PO TABS: 1.0000 | ORAL_TABLET | Freq: Every morning | ORAL | 3 refills | Status: DC

## 2020-03-08 MED ORDER — TRULICITY 0.75 MG/0.5ML ~~LOC~~ SOAJ
SUBCUTANEOUS | 0 refills | Status: DC
Start: 2020-03-08 — End: 2020-07-05

## 2020-03-08 MED ORDER — OMEPRAZOLE 40 MG PO CPDR: 40.0000 mg | DELAYED_RELEASE_CAPSULE | Freq: Every day | ORAL | 3 refills | Status: DC

## 2020-03-08 MED ORDER — ALBUTEROL SULFATE HFA 108 (90 BASE) MCG/ACT IN AERS
2.0000 | INHALATION_SPRAY | Freq: Four times a day (QID) | RESPIRATORY_TRACT | 4 refills | Status: DC | PRN
Start: 2020-03-08 — End: 2021-01-30

## 2020-03-08 MED ORDER — FLUTICASONE-SALMETEROL 250-50 MCG/DOSE IN AEPB
INHALATION_SPRAY | RESPIRATORY_TRACT | 3 refills | Status: DC
Start: 2020-03-08 — End: 2023-11-18

## 2020-03-08 NOTE — Progress Notes (Signed)
Subjective:  Patient ID: Rita Payne, female    DOB: [birth year]  Age: 75 y.o. MRN: [id number]  CC: Diabetes   HPI Rita Payne presents forFollow-up of diabetes. Patient checks blood sugar at home.   120-130 fasting and not checking postprandial Patient denies symptoms such as polyuria, polydipsia, excessive hunger, nausea No significant hypoglycemic spells noted. Medications reviewed. Pt reports taking them regularly without complication/adverse reaction being reported today.  Checking feet daily. Feet tingle, feel cold. Applying lotion to feet after shower every day.   Has daytime drowsiness, sleepiness.    History Rita Payne has a past medical history of Allergy, Arthritis, Basal cell carcinoma, Cataract, Diarrhea, First degree heart block, GERD (gastroesophageal reflux disease), History of kidney stones, Hyperlipidemia, Hypertension, Leg fracture, Melanoma ([place]), Mild intermittent asthma, SUI (stress urinary incontinence, female), and Type 2 diabetes mellitus ([place]).   She has a past surgical history that includes Tonsillectomy; Abdominal hysterectomy (1987); Cholecystectomy (1989); Knee arthroscopy (Bilateral); Hernia repair; kidney tumor removal  (1986); Blepharoptosis repair (Bilateral, 2009); Total hip arthroplasty (Right, 08/25/2012); Lumbar laminectomy/decompression microdiscectomy (N/A, 12/08/2013); Pubovaginal sling (N/A, 10/03/2014); and Replacement total hip w/  resurfacing implants (Right).   Her family history includes Arrhythmia in her brother; Arthritis/Rheumatoid in her brother and sister; Colon polyps in her sister and sister; Diabetes in her mother, sister, and sister; Heart attack in her brother and mother; Heart disease in her brother, father, mother, and sister; Heart failure in her brother and sister; Stroke in her father, mother, and sister; Vision loss in her brother.She reports that she has been smoking cigarettes. She has a 8.25 pack-year smoking history. She has never  used smokeless tobacco. She reports that she does not drink alcohol and does not use drugs.  Current Outpatient Medications on File Prior to Visit  Medication Sig Dispense Refill  . aspirin 81 MG tablet Take 1 tablet (81 mg total) by mouth daily. Resume 4 days post-op 30 tablet   . celecoxib (CELEBREX) 200 MG capsule TAKE 1 CAPSULE EVERY 12 HOURS 180 capsule 3  . cetirizine (ZYRTEC) 10 MG tablet TAKE 1 TABLET DAILY 90 tablet 3  . cholecalciferol (VITAMIN D) 1000 UNITS tablet Take 1,000 Units by mouth daily.    . diclofenac Sodium (VOLTAREN) 1 % GEL Apply 2 g topically 4 (four) times daily. 100 g 2  . glucose blood (FREESTYLE LITE) test strip Test qd. DX: E11.9 100 each 3   Current Facility-Administered Medications on File Prior to Visit  Medication Dose Route Frequency Provider Last Rate Last Admin  . magnesium citrate solution 1 Bottle  1 Bottle Oral Once Carolan Clines, MD      . sodium phosphate (FLEET) 7-19 GM/118ML enema 1 enema  1 enema Rectal Once Carolan Clines, MD        ROS Review of Systems  Constitutional: Negative.   HENT: Negative.   Eyes: Negative for visual disturbance.  Respiratory: Negative for shortness of breath.   Cardiovascular: Negative for chest pain.  Gastrointestinal: Negative for abdominal pain.  Musculoskeletal: Negative for arthralgias.    Objective:  BP 104/65   Pulse 81   Temp (!) 97.3 F (36.3 C) (Temporal)   Ht $R'5\' 4"'nP$  (1.626 m)   Wt 181 lb (82.1 kg)   LMP 04/14/1982   BMI 31.07 kg/m   BP Readings from Last 3 Encounters:  03/08/20 104/65  12/07/19 118/67  11/22/19 99/65    Wt Readings from Last 3 Encounters:  03/08/20 181 lb (82.1 kg)  12/07/19 182 lb (82.6  kg)  11/22/19 185 lb 3.2 oz (84 kg)     Physical Exam Constitutional:      General: She is not in acute distress.    Appearance: She is well-developed.  HENT:     Head: Normocephalic and atraumatic.  Eyes:     Conjunctiva/sclera: Conjunctivae normal.     Pupils:  Pupils are equal, round, and reactive to light.  Neck:     Thyroid: No thyromegaly.  Cardiovascular:     Rate and Rhythm: Normal rate and regular rhythm.     Heart sounds: Normal heart sounds. No murmur heard.   Pulmonary:     Effort: Pulmonary effort is normal. No respiratory distress.     Breath sounds: Normal breath sounds. No wheezing or rales.  Abdominal:     General: Bowel sounds are normal. There is no distension.     Palpations: Abdomen is soft.     Tenderness: There is no abdominal tenderness.  Musculoskeletal:        General: Normal range of motion.     Cervical back: Normal range of motion and neck supple.  Lymphadenopathy:     Cervical: No cervical adenopathy.  Skin:    General: Skin is warm and dry.  Neurological:     Mental Status: She is alert and oriented to person, place, and time.  Psychiatric:        Behavior: Behavior normal.        Thought Content: Thought content normal.        Judgment: Judgment normal.       Assessment & Plan:   Rita Payne was seen today for diabetes.  Diagnoses and all orders for this visit:  Type 2 diabetes mellitus without complication, without long-term current use of insulin (HCC) -     Bayer DCA Hb A1c Waived -     Cancel: CBC with Differential/Platelet -     Cancel: CMP14+EGFR -     Cancel: Lipid panel -     empagliflozin (JARDIANCE) 25 MG TABS tablet; Take 1 tablet (25 mg total) by mouth daily before breakfast. -     glimepiride (AMARYL) 4 MG tablet; Take 1 tablet (4 mg total) by mouth 2 (two) times daily. -     Dulaglutide (TRULICITY) 5.05 LZ/7.6BH SOPN; INJECT 0.5 ML (0.75 MG) UNDER THE SKIN ONCE A WEEK -     CBC with Differential/Platelet -     CMP14+EGFR  Hyperlipidemia associated with type 2 diabetes mellitus (HCC) -     Cancel: CBC with Differential/Platelet -     Cancel: CMP14+EGFR -     Cancel: Lipid panel -     CBC with Differential/Platelet -     CMP14+EGFR -     Lipid panel  Hypertension associated with  type 2 diabetes mellitus (HCC) -     Cancel: CBC with Differential/Platelet -     Cancel: CMP14+EGFR -     Cancel: Lipid panel -     CBC with Differential/Platelet -     CMP14+EGFR  Asthma, chronic, mild intermittent, uncomplicated -     Fluticasone-Salmeterol (ADVAIR DISKUS) 250-50 MCG/DOSE AEPB; USE 1 INHALATION EVERY 12 HOURS -     CBC with Differential/Platelet -     CMP14+EGFR  Essential hypertension -     telmisartan-hydrochlorothiazide (MICARDIS HCT) 80-25 MG tablet; Take 1 tablet by mouth every morning. -     cloNIDine (CATAPRES) 0.1 MG tablet; Take 1 tablet (0.1 mg total) by mouth 2 (two) times daily. -  CBC with Differential/Platelet -     CMP14+EGFR  Gastroesophageal reflux disease without esophagitis -     omeprazole (PRILOSEC) 40 MG capsule; Take 1 capsule (40 mg total) by mouth daily. -     CBC with Differential/Platelet -     CMP14+EGFR  Mild intermittent chronic asthma without complication -     albuterol (PROAIR HFA) 108 (90 Base) MCG/ACT inhaler; Inhale 2 puffs into the lungs every 6 (six) hours as needed for wheezing or shortness of breath. Pt needs for chronic asthma -     CBC with Differential/Platelet -     CMP14+EGFR  Post-menopausal -     Estradiol (VAGIFEM) 10 MCG TABS vaginal tablet; Place 1 tablet (10 mcg total) vaginally 2 (two) times a week. -     CBC with Differential/Platelet -     CMP14+EGFR  Vitamin D deficiency -     VITAMIN D 25 Hydroxy (Vit-D Deficiency, Fractures)  Paresthesias -     Vitamin B12  Other orders -     atorvastatin (LIPITOR) 80 MG tablet; Take 1 tablet (80 mg total) by mouth daily.      I have changed Rita Look Wesely "Rita Payne"'s Jardiance to empagliflozin. I have also changed her glimepiride. I am also having her maintain her cholecalciferol, aspirin, diclofenac Sodium, celecoxib, cetirizine, FREESTYLE LITE, Fluticasone-Salmeterol, telmisartan-hydrochlorothiazide, cloNIDine, omeprazole, albuterol, Estradiol, Trulicity, and  atorvastatin.  Meds ordered this encounter  Medications  . Fluticasone-Salmeterol (ADVAIR DISKUS) 250-50 MCG/DOSE AEPB    Sig: USE 1 INHALATION EVERY 12 HOURS    Dispense:  180 each    Refill:  3  . telmisartan-hydrochlorothiazide (MICARDIS HCT) 80-25 MG tablet    Sig: Take 1 tablet by mouth every morning.    Dispense:  90 tablet    Refill:  3    YOUR PATIENT HAS REQUESTED A REFILL OF THIS MEDICATION, PREVIOUSLY AUTHORIZED BY ANOTHER PRESCRIBER.  . cloNIDine (CATAPRES) 0.1 MG tablet    Sig: Take 1 tablet (0.1 mg total) by mouth 2 (two) times daily.    Dispense:  180 tablet    Refill:  1  . omeprazole (PRILOSEC) 40 MG capsule    Sig: Take 1 capsule (40 mg total) by mouth daily.    Dispense:  90 capsule    Refill:  3  . albuterol (PROAIR HFA) 108 (90 Base) MCG/ACT inhaler    Sig: Inhale 2 puffs into the lungs every 6 (six) hours as needed for wheezing or shortness of breath. Pt needs for chronic asthma    Dispense:  25.5 g    Refill:  4  . Estradiol (VAGIFEM) 10 MCG TABS vaginal tablet    Sig: Place 1 tablet (10 mcg total) vaginally 2 (two) times a week.    Dispense:  24 tablet    Refill:  1  . empagliflozin (JARDIANCE) 25 MG TABS tablet    Sig: Take 1 tablet (25 mg total) by mouth daily before breakfast.    Dispense:  90 tablet    Refill:  1  . glimepiride (AMARYL) 4 MG tablet    Sig: Take 1 tablet (4 mg total) by mouth 2 (two) times daily.    Dispense:  180 tablet    Refill:  2  . Dulaglutide (TRULICITY) 4.70 JG/2.8ZM SOPN    Sig: INJECT 0.5 ML (0.75 MG) UNDER THE SKIN ONCE A WEEK    Dispense:  6 mL    Refill:  0  . atorvastatin (LIPITOR) 80 MG tablet    Sig: Take  1 tablet (80 mg total) by mouth daily.    Dispense:  90 tablet    Refill:  3    This replaces the prescription sent yesterday if the patient has not already picked it up.     Follow-up: Return in about 3 months (around 06/08/2020).  Claretta Fraise, M.D.

## 2020-03-09 LAB — CBC WITH DIFFERENTIAL/PLATELET
Basophils Absolute: 0 10*3/uL (ref 0.0–0.2)
Basos: 0 %
EOS (ABSOLUTE): 0.2 10*3/uL (ref 0.0–0.4)
Eos: 2 %
Hematocrit: 43.9 % (ref 34.0–46.6)
Hemoglobin: 15.3 g/dL (ref 11.1–15.9)
Immature Grans (Abs): 0.1 10*3/uL (ref 0.0–0.1)
Immature Granulocytes: 1 %
Lymphocytes Absolute: 2.3 10*3/uL (ref 0.7–3.1)
Lymphs: 22 %
MCH: 31.6 pg (ref 26.6–33.0)
MCHC: 34.9 g/dL (ref 31.5–35.7)
MCV: 91 fL (ref 79–97)
Monocytes Absolute: 0.8 10*3/uL (ref 0.1–0.9)
Monocytes: 8 %
Neutrophils Absolute: 7.2 10*3/uL — ABNORMAL HIGH (ref 1.4–7.0)
Neutrophils: 67 %
Platelets: 180 10*3/uL (ref 150–450)
RBC: 4.84 x10E6/uL (ref 3.77–5.28)
RDW: 13.7 % (ref 11.7–15.4)
WBC: 10.7 10*3/uL (ref 3.4–10.8)

## 2020-03-09 LAB — CMP14+EGFR
ALT: 25 IU/L (ref 0–32)
AST: 22 IU/L (ref 0–40)
Albumin/Globulin Ratio: 1.7 (ref 1.2–2.2)
Albumin: 4.5 g/dL (ref 3.7–4.7)
Alkaline Phosphatase: 87 IU/L (ref 44–121)
BUN/Creatinine Ratio: 26 (ref 12–28)
BUN: 24 mg/dL (ref 8–27)
Bilirubin Total: 0.5 mg/dL (ref 0.0–1.2)
CO2: 22 mmol/L (ref 20–29)
Calcium: 9.5 mg/dL (ref 8.7–10.3)
Chloride: 102 mmol/L (ref 96–106)
Creatinine, Ser: 0.93 mg/dL (ref 0.57–1.00)
Globulin, Total: 2.6 g/dL (ref 1.5–4.5)
Glucose: 134 mg/dL — ABNORMAL HIGH (ref 65–99)
Potassium: 4.2 mmol/L (ref 3.5–5.2)
Sodium: 141 mmol/L (ref 134–144)
Total Protein: 7.1 g/dL (ref 6.0–8.5)
eGFR: 65 mL/min/{1.73_m2} (ref >59)

## 2020-03-09 LAB — VITAMIN D 25 HYDROXY (VIT D DEFICIENCY, FRACTURES): Vit D, 25-Hydroxy: 39.7 ng/mL (ref 30.0–100.0)

## 2020-03-09 LAB — LIPID PANEL
Chol/HDL Ratio: 4.1 ratio (ref 0.0–4.4)
Cholesterol, Total: 130 mg/dL (ref 100–199)
HDL: 32 mg/dL — ABNORMAL LOW (ref >39)
LDL Chol Calc (NIH): 57 mg/dL (ref 0–99)
Triglycerides: 261 mg/dL — ABNORMAL HIGH (ref 0–149)
VLDL Cholesterol Cal: 41 mg/dL — ABNORMAL HIGH (ref 5–40)

## 2020-03-09 LAB — VITAMIN B12: Vitamin B-12: 351 pg/mL (ref 232–1245)

## 2020-03-10 ENCOUNTER — Other Ambulatory Visit: Payer: Self-pay | Admitting: Family Medicine

## 2020-03-10 MED ORDER — FENOFIBRATE 48 MG PO TABS: 48.0000 mg | ORAL_TABLET | Freq: Every day | ORAL | 1 refills | Status: DC

## 2020-04-04 DIAGNOSIS — E119 Type 2 diabetes mellitus without complications: Secondary | ICD-10-CM | POA: Diagnosis not present

## 2020-04-04 LAB — HM DIABETES EYE EXAM

## 2020-04-25 ENCOUNTER — Encounter: Payer: Self-pay | Admitting: *Deleted

## 2020-04-25 DIAGNOSIS — Z23 Encounter for immunization: Secondary | ICD-10-CM | POA: Diagnosis not present

## 2020-06-08 ENCOUNTER — Ambulatory Visit: Payer: Medicare Other | Admitting: Family Medicine

## 2020-06-19 ENCOUNTER — Ambulatory Visit (INDEPENDENT_AMBULATORY_CARE_PROVIDER_SITE_OTHER): Payer: Medicare Other | Admitting: Family Medicine

## 2020-06-19 ENCOUNTER — Other Ambulatory Visit: Payer: Self-pay

## 2020-06-19 ENCOUNTER — Encounter: Payer: Self-pay | Admitting: Family Medicine

## 2020-06-19 VITALS — BP 113/63 | HR 82 | Temp 97.8°F | Ht 64.0 in | Wt 179.0 lb

## 2020-06-19 DIAGNOSIS — E785 Hyperlipidemia, unspecified: Secondary | ICD-10-CM | POA: Diagnosis not present

## 2020-06-19 DIAGNOSIS — H04123 Dry eye syndrome of bilateral lacrimal glands: Secondary | ICD-10-CM | POA: Diagnosis not present

## 2020-06-19 DIAGNOSIS — R202 Paresthesia of skin: Secondary | ICD-10-CM

## 2020-06-19 DIAGNOSIS — E119 Type 2 diabetes mellitus without complications: Secondary | ICD-10-CM

## 2020-06-19 DIAGNOSIS — E1169 Type 2 diabetes mellitus with other specified complication: Secondary | ICD-10-CM

## 2020-06-19 DIAGNOSIS — R42 Dizziness and giddiness: Secondary | ICD-10-CM

## 2020-06-19 LAB — CMP14+EGFR
ALT: 21 IU/L (ref 0–32)
AST: 16 IU/L (ref 0–40)
Albumin/Globulin Ratio: 1.7 (ref 1.2–2.2)
Albumin: 4.2 g/dL (ref 3.7–4.7)
Alkaline Phosphatase: 79 IU/L (ref 44–121)
BUN/Creatinine Ratio: 21 (ref 12–28)
BUN: 18 mg/dL (ref 8–27)
Bilirubin Total: 0.3 mg/dL (ref 0.0–1.2)
CO2: 23 mmol/L (ref 20–29)
Calcium: 9.5 mg/dL (ref 8.7–10.3)
Chloride: 103 mmol/L (ref 96–106)
Creatinine, Ser: 0.86 mg/dL (ref 0.57–1.00)
Globulin, Total: 2.5 g/dL (ref 1.5–4.5)
Glucose: 123 mg/dL — ABNORMAL HIGH (ref 65–99)
Potassium: 4.4 mmol/L (ref 3.5–5.2)
Sodium: 142 mmol/L (ref 134–144)
Total Protein: 6.7 g/dL (ref 6.0–8.5)
eGFR: 71 mL/min/{1.73_m2} (ref >59)

## 2020-06-19 LAB — CBC WITH DIFFERENTIAL/PLATELET
Basophils Absolute: 0.1 10*3/uL (ref 0.0–0.2)
Basos: 1 %
EOS (ABSOLUTE): 0.3 10*3/uL (ref 0.0–0.4)
Eos: 3 %
Hematocrit: 42.9 % (ref 34.0–46.6)
Hemoglobin: 14.7 g/dL (ref 11.1–15.9)
Immature Grans (Abs): 0 10*3/uL (ref 0.0–0.1)
Immature Granulocytes: 0 %
Lymphocytes Absolute: 2.5 10*3/uL (ref 0.7–3.1)
Lymphs: 24 %
MCH: 31.7 pg (ref 26.6–33.0)
MCHC: 34.3 g/dL (ref 31.5–35.7)
MCV: 93 fL (ref 79–97)
Monocytes Absolute: 0.9 10*3/uL (ref 0.1–0.9)
Monocytes: 9 %
Neutrophils Absolute: 6.8 10*3/uL (ref 1.4–7.0)
Neutrophils: 63 %
Platelets: 160 10*3/uL (ref 150–450)
RBC: 4.64 x10E6/uL (ref 3.77–5.28)
RDW: 13.9 % (ref 11.7–15.4)
WBC: 10.6 10*3/uL (ref 3.4–10.8)

## 2020-06-19 LAB — LIPID PANEL
Chol/HDL Ratio: 4.2 ratio (ref 0.0–4.4)
Cholesterol, Total: 134 mg/dL (ref 100–199)
HDL: 32 mg/dL — ABNORMAL LOW (ref >39)
LDL Chol Calc (NIH): 58 mg/dL (ref 0–99)
Triglycerides: 276 mg/dL — ABNORMAL HIGH (ref 0–149)
VLDL Cholesterol Cal: 44 mg/dL — ABNORMAL HIGH (ref 5–40)

## 2020-06-19 LAB — BAYER DCA HB A1C WAIVED: HB A1C (BAYER DCA - WAIVED): 6.5 % (ref <7.0)

## 2020-06-19 MED ORDER — GABAPENTIN 100 MG PO CAPS: 100.0000 mg | ORAL_CAPSULE | Freq: Three times a day (TID) | ORAL | 3 refills | Status: DC

## 2020-06-19 MED ORDER — XIIDRA 5 % OP SOLN
1.0000 [drp] | Freq: Two times a day (BID) | OPHTHALMIC | 3 refills | Status: DC
Start: 2020-06-19 — End: 2020-09-21

## 2020-06-19 NOTE — Progress Notes (Signed)
Subjective:  Patient ID: Rita Payne, female    DOB: 1945-04-26  Age: 75 y.o. MRN: [id number]  CC: Medical Management of Chronic Issues   HPI  Getting waves of dizziness  walking aroun in a store. Also if sitting at desk. Sudden sensation that room is spinning. Onset 6-7 weeks ago. Random from daily to 2-3 weeks between.  Toes at night feel really cold. Feel numb, aslep.  Rita Payne presents forFollow-up of diabetes. Patient checks blood sugar at home.  90-115 fasting and 120-140 postprandial Patient denies symptoms such as polyuria, polydipsia, excessive hunger, nausea No significant hypoglycemic spells noted. Medications reviewed. Pt reports taking them regularly without complication/adverse reaction being reported today.  Checking feet daily. Last eye appt was January  History Rita Payne has a past medical history of Allergy, Arthritis, Basal cell carcinoma, Cataract, Diarrhea, First degree heart block, GERD (gastroesophageal reflux disease), History of kidney stones, Hyperlipidemia, Hypertension, Leg fracture, Melanoma (HCC), Mild intermittent asthma, SUI (stress urinary incontinence, female), and Type 2 diabetes mellitus (HCC).   She has a past surgical history that includes Tonsillectomy; Abdominal hysterectomy (1987); Cholecystectomy (1989); Knee arthroscopy (Bilateral); Hernia repair; kidney tumor removal  (1986); Blepharoptosis repair (Bilateral, 2009); Total hip arthroplasty (Right, 08/25/2012); Lumbar laminectomy/decompression microdiscectomy (N/A, 12/08/2013); Pubovaginal sling (N/A, 10/03/2014); and Replacement total hip w/  resurfacing implants (Right).   Her family history includes Arrhythmia in her brother; Arthritis/Rheumatoid in her brother and sister; Colon polyps in her sister and sister; Diabetes in her mother, sister, and sister; Heart attack in her brother and mother; Heart disease in her brother, father, mother, and sister; Heart failure in her brother and sister; Stroke  in her father, mother, and sister; Vision loss in her brother.She reports that she has been smoking cigarettes. She has a 8.25 pack-year smoking history. She has never used smokeless tobacco. She reports that she does not drink alcohol and does not use drugs.  Current Outpatient Medications on File Prior to Visit  Medication Sig Dispense Refill   albuterol (PROAIR HFA) 108 (90 Base) MCG/ACT inhaler Inhale 2 puffs into the lungs every 6 (six) hours as needed for wheezing or shortness of breath. Pt needs for chronic asthma 25.5 g 4   aspirin 81 MG tablet Take 1 tablet (81 mg total) by mouth daily. Resume 4 days post-op 30 tablet    atorvastatin (LIPITOR) 80 MG tablet Take 1 tablet (80 mg total) by mouth daily. 90 tablet 3   celecoxib (CELEBREX) 200 MG capsule TAKE 1 CAPSULE EVERY 12 HOURS 180 capsule 3   cetirizine (ZYRTEC) 10 MG tablet TAKE 1 TABLET DAILY 90 tablet 3   cholecalciferol (VITAMIN D) 1000 UNITS tablet Take 1,000 Units by mouth daily.     cloNIDine (CATAPRES) 0.1 MG tablet Take 1 tablet (0.1 mg total) by mouth 2 (two) times daily. 180 tablet 1   diclofenac Sodium (VOLTAREN) 1 % GEL Apply 2 g topically 4 (four) times daily. 100 g 2   Dulaglutide (TRULICITY) 0.75 MG/0.5ML SOPN INJECT 0.5 ML (0.75 MG) UNDER THE SKIN ONCE A WEEK 6 mL 0   empagliflozin (JARDIANCE) 25 MG TABS tablet Take 1 tablet (25 mg total) by mouth daily before breakfast. 90 tablet 1   Estradiol (VAGIFEM) 10 MCG TABS vaginal tablet Place 1 tablet (10 mcg total) vaginally 2 (two) times a week. 24 tablet 1   fenofibrate (TRICOR) 48 MG tablet Take 1 tablet (48 mg total) by mouth daily. 90 tablet 1   Fluticasone-Salmeterol (ADVAIR DISKUS) 250-50 MCG/DOSE AEPB  USE 1 INHALATION EVERY 12 HOURS 180 each 3   glimepiride (AMARYL) 4 MG tablet Take 1 tablet (4 mg total) by mouth 2 (two) times daily. 180 tablet 2   glucose blood (FREESTYLE LITE) test strip Test qd. DX: E11.9 100 each 3   omeprazole (PRILOSEC) 40 MG capsule Take 1  capsule (40 mg total) by mouth daily. 90 capsule 3   telmisartan-hydrochlorothiazide (MICARDIS HCT) 80-25 MG tablet Take 1 tablet by mouth every morning. 90 tablet 3   Current Facility-Administered Medications on File Prior to Visit  Medication Dose Route Frequency Provider Last Rate Last Admin   magnesium citrate solution 1 Bottle  1 Bottle Oral Once Carolan Clines, MD       sodium phosphate (FLEET) 7-19 GM/118ML enema 1 enema  1 enema Rectal Once Carolan Clines, MD        ROS Review of Systems  Eyes:        Eyes dry. Ophthalmologist dx dry eyes.   Objective:  BP 113/63   Pulse 82   Temp 97.8 F (36.6 C)   Ht $R'5\' 4"'Ci$  (1.626 m)   Wt 179 lb (81.2 kg)   LMP 04/14/1982   SpO2 98%   BMI 30.73 kg/m   BP Readings from Last 3 Encounters:  06/19/20 113/63  03/08/20 104/65  12/07/19 118/67    Wt Readings from Last 3 Encounters:  06/19/20 179 lb (81.2 kg)  03/08/20 181 lb (82.1 kg)  12/07/19 182 lb (82.6 kg)     Physical Exam    Assessment & Plan:   Rita Payne was seen today for medical management of chronic issues.  Diagnoses and all orders for this visit:  Type 2 diabetes mellitus without complication, without long-term current use of insulin (HCC) -     Bayer DCA Hb A1c Waived -     CBC with Differential/Platelet -     CMP14+EGFR  Hyperlipidemia associated with type 2 diabetes mellitus (HCC) -     Lipid panel  Vertigo -     Ambulatory referral to Physical Therapy  Paresthesias  Dry eye syndrome of both eyes -     Lifitegrast (XIIDRA) 5 % SOLN; Apply 1 drop to eye 2 (two) times daily.  Other orders -     gabapentin (NEURONTIN) 100 MG capsule; Take 1 capsule (100 mg total) by mouth 3 (three) times daily.     I am having Rita Payne "[place]" start on gabapentin and Xiidra. I am also having her maintain her cholecalciferol, aspirin, diclofenac Sodium, celecoxib, cetirizine, FREESTYLE LITE, Fluticasone-Salmeterol, telmisartan-hydrochlorothiazide,  cloNIDine, omeprazole, albuterol, Estradiol, empagliflozin, glimepiride, Trulicity, atorvastatin, and fenofibrate.  Meds ordered this encounter  Medications   gabapentin (NEURONTIN) 100 MG capsule    Sig: Take 1 capsule (100 mg total) by mouth 3 (three) times daily.    Dispense:  90 capsule    Refill:  3   Lifitegrast (XIIDRA) 5 % SOLN    Sig: Apply 1 drop to eye 2 (two) times daily.    Dispense:  180 each    Refill:  3     Follow-up: No follow-ups on file.  Rita Payne, M.D.

## 2020-06-20 NOTE — Progress Notes (Signed)
Hello Aline,  Your lab result is normal and/or stable.Some minor variations that are not significant are commonly marked abnormal, but do not represent any medical problem for you.  Best regards, Claretta Fraise, M.D.

## 2020-06-21 ENCOUNTER — Ambulatory Visit (INDEPENDENT_AMBULATORY_CARE_PROVIDER_SITE_OTHER): Payer: Medicare Other

## 2020-06-21 VITALS — Ht 64.0 in | Wt 179.0 lb

## 2020-06-21 DIAGNOSIS — Z0000 Encounter for general adult medical examination without abnormal findings: Secondary | ICD-10-CM | POA: Diagnosis not present

## 2020-06-21 NOTE — Progress Notes (Signed)
Subjective:   Rita Payne is a 75 y.o. female who presents for Medicare Annual (Subsequent) preventive examination.  Virtual Visit via Telephone Note  I connected with  Rita Payne on 06/21/20 at  2:00 PM EDT by telephone and verified that I am speaking with the correct person using two identifiers.  Location: Patient: Home Provider: [facility] Persons participating in the virtual visit: patient/Nurse Health Advisor   I discussed the limitations, risks, security and privacy concerns of performing an evaluation and management service by telephone and the availability of in person appointments. The patient expressed understanding and agreed to proceed.  Interactive audio and video telecommunications were attempted between this nurse and patient, however failed, due to patient having technical difficulties OR patient did not have access to video capability.  We continued and completed visit with audio only.  Some vital signs may be absent or patient reported.   Tysean Vand[place]liet E Clarion Mooneyhan, LPN   Review of Systems     Cardiac Risk Factors include: advanced age (>42men, >25 women);diabetes mellitus;dyslipidemia;hypertension;obesity (BMI >30kg/m2);sedentary lifestyle     Objective:    Today's Vitals   06/21/20 1415  Weight: 179 lb (81.2 kg)  Height: 5\' 4"  (1.626 m)   Body mass index is 30.73 kg/m.  Advanced Directives 06/21/2020 06/21/2019 06/18/2018 06/11/2017 06/05/2016 10/03/2014 09/29/2014  Does Patient Have a Medical Advance Directive? No Yes Yes Yes Yes Yes Yes  Type of Advance Directive - [place];Living will [place];Living will [place];Living will Healthcare Power of [place];Living will -  Does patient want to make changes to medical advance directive? - No - Patient declined No - Patient declined - No - Patient declined No - Patient declined -  Copy of [place] in Chart? - No - copy  requested No - copy requested No - copy requested No - copy requested No - copy requested No - copy requested  Would patient like information on creating a medical advance directive? No - Patient declined - - - - - -  Pre-existing out of facility DNR order (yellow form or pink MOST form) - - - - - - -    Current Medications (verified) Outpatient Encounter Medications as of 06/21/2020  Medication Sig   albuterol (PROAIR HFA) 108 (90 Base) MCG/ACT inhaler Inhale 2 puffs into the lungs every 6 (six) hours as needed for wheezing or shortness of breath. Pt needs for chronic asthma   aspirin 81 MG tablet Take 1 tablet (81 mg total) by mouth daily. Resume 4 days post-op   atorvastatin (LIPITOR) 80 MG tablet Take 1 tablet (80 mg total) by mouth daily.   celecoxib (CELEBREX) 200 MG capsule TAKE 1 CAPSULE EVERY 12 HOURS   cetirizine (ZYRTEC) 10 MG tablet TAKE 1 TABLET DAILY   cholecalciferol (VITAMIN D) 1000 UNITS tablet Take 1,000 Units by mouth daily.   cloNIDine (CATAPRES) 0.1 MG tablet Take 1 tablet (0.1 mg total) by mouth 2 (two) times daily.   diclofenac Sodium (VOLTAREN) 1 % GEL Apply 2 g topically 4 (four) times daily.   Dulaglutide (TRULICITY) 1.32 GM/0.1UU SOPN INJECT 0.5 ML (0.75 MG) UNDER THE SKIN ONCE A WEEK   empagliflozin (JARDIANCE) 25 MG TABS tablet Take 1 tablet (25 mg total) by mouth daily before breakfast.   Estradiol (VAGIFEM) 10 MCG TABS vaginal tablet Place 1 tablet (10 mcg total) vaginally 2 (two) times a week.   fenofibrate (TRICOR) 48 MG tablet Take 1  tablet (48 mg total) by mouth daily.   Fluticasone-Salmeterol (ADVAIR DISKUS) 250-50 MCG/DOSE AEPB USE 1 INHALATION EVERY 12 HOURS   gabapentin (NEURONTIN) 100 MG capsule Take 1 capsule (100 mg total) by mouth 3 (three) times daily.   glimepiride (AMARYL) 4 MG tablet Take 1 tablet (4 mg total) by mouth 2 (two) times daily.   glucose blood (FREESTYLE LITE) test strip Test qd. DX: E11.9   Lifitegrast (XIIDRA) 5 % SOLN Apply 1 drop  to eye 2 (two) times daily.   omeprazole (PRILOSEC) 40 MG capsule Take 1 capsule (40 mg total) by mouth daily.   telmisartan-hydrochlorothiazide (MICARDIS HCT) 80-25 MG tablet Take 1 tablet by mouth every morning.   Facility-Administered Encounter Medications as of 06/21/2020  Medication   magnesium citrate solution 1 Bottle   sodium phosphate (FLEET) 7-19 GM/118ML enema 1 enema    Allergies (verified) Ivp dye [iodinated diagnostic agents], Shellfish allergy, Vasotec [enalapril], Adhesive [tape], Amlodipine, Iodine, and Pyridium [phenazopyridine hcl]   History: Past Medical History:  Diagnosis Date   Allergy    Arthritis    Basal cell carcinoma    Cataract    Diarrhea    First degree heart block    GERD (gastroesophageal reflux disease)    History of kidney stones    Hyperlipidemia    Hypertension    Leg fracture    Melanoma ([place])    Mild intermittent asthma    SUI (stress urinary incontinence, female)    Type 2 diabetes mellitus ([place])    Past Surgical History:  Procedure Laterality Date   ABDOMINAL HYSTERECTOMY  1987   BELPHAROPTOSIS REPAIR Bilateral 2009   [place]     kidney tumor removal   1986   KNEE ARTHROSCOPY Bilateral    LUMBAR LAMINECTOMY/DECOMPRESSION MICRODISCECTOMY N/A 12/08/2013   Procedure: LUMBAR DECOMPRESSION L4-L5,  L3-L4 ;  Surgeon: Johnn Hai, MD;  Location: [facility];  Service: Orthopedics;  Laterality: N/A;   PUBOVAGINAL SLING N/A 10/03/2014   Procedure: Gaynelle Arabian;  Surgeon: Carolan Clines, MD;  Location: [facility];  Service: Urology;  Laterality: N/A;   REPLACEMENT TOTAL HIP W/  RESURFACING IMPLANTS Right    TONSILLECTOMY     TOTAL HIP ARTHROPLASTY Right 08/25/2012   Procedure: RIGHT TOTAL HIP ARTHROPLASTY ANTERIOR APPROACH;  Surgeon: [name], MD;  Location: [facility];  Service: Orthopedics;  Laterality: Right;   Family History  Problem Relation Age of Onset   Diabetes Mother     Heart disease Mother    Stroke Mother    Heart attack Mother    Heart disease Father    Stroke Father    Vision loss Brother    Arthritis/Rheumatoid Brother    Diabetes Sister    Heart disease Sister    Heart failure Sister    Diabetes Sister    Stroke Sister    Colon polyps Sister    Heart disease Brother    Heart failure Brother    Heart attack Brother    Arrhythmia Brother    Arthritis/Rheumatoid Sister    Colon polyps Sister    Colon cancer Neg Hx    Esophageal cancer Neg Hx    Stomach cancer Neg Hx    Rectal cancer Neg Hx    Social History   Socioeconomic History   Marital status: Widowed    Spouse name: Not on file   Number of children: 2   Years of education:  some college   Highest education level: Some college, no degree  Occupational History   Occupation: Paralegal   Tobacco Use   Smoking status: Every Day    Packs/day: 0.25    Years: 33.00    Pack years: 8.25    Types: Cigarettes   Smokeless tobacco: Never  Vaping Use   Vaping Use: Never used  Substance and Sexual Activity   Alcohol use: No   Drug use: No   Sexual activity: Not Currently  Other Topics Concern   Not on file  Social History Narrative   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 Son in [place], daughter in [place]. 2 sisters live nearby   Social Determinants of Health   Financial Resource Strain: Low Risk    Difficulty of Paying Living Expenses: Not hard at all  Food Insecurity: No Food Insecurity   Worried About Charity fundraiser in the Last Year: Never true   Arboriculturist in the Last Year: Never true  Transportation Needs: No Transportation Needs   Lack of Transportation (Medical): No   Lack of Transportation (Non-Medical): No  Physical Activity: Insufficiently Active   Days of Exercise per Week: 7 days   Minutes of Exercise per Session: 10 min  Stress: No Stress Concern Present   Feeling of Stress : Only a little  Social Connections: Socially  Isolated   Frequency of Communication with Friends and Family: More than three times a week   Frequency of Social Gatherings with Friends and Family: More than three times a week   Attends Religious Services: Never   Marine scientist or Organizations: No   Attends Archivist Meetings: Never   Marital Status: Widowed    Tobacco Counseling Ready to quit: Yes Counseling given: Yes   Clinical Intake:  Pre-visit preparation completed: Yes  Pain : No/denies pain     BMI - recorded: 30.73 Nutritional Status: BMI > 30  Obese Nutritional Risks: None Diabetes: Yes CBG done?: No Did pt. bring in CBG monitor from home?: No  How often do you need to have someone help you when you read instructions, pamphlets, or other written materials from your doctor or pharmacy?: 1 - Never  Nutrition Risk Assessment:  Has the patient had any N/V/D within the last 2 months?  No  Does the patient have any non-healing wounds?  No  Has the patient had any unintentional weight loss or weight gain?  No   Diabetes:  Is the patient diabetic?  Yes  If diabetic, was a CBG obtained today?  No  Did the patient bring in their glucometer from home?  No  How often do you monitor your CBG's? Once daily fasting - 104 this am fasting per patient.   Financial Strains and Diabetes Management:  Are you having any financial strains with the device, your supplies or your medication? No .  Does the patient want to be seen by Chronic Care Management for management of their diabetes?  No  Would the patient like to be referred to a Nutritionist or for Diabetic Management?  No   Diabetic Exams:  Diabetic Eye Exam: Completed 04/04/20.   Diabetic Foot Exam: Completed 08/26/2018. Pt has been advised about the importance in completing this exam. Pt is scheduled for diabetic foot exam on 09/21/2020.    Interpreter Needed?: No  Information entered by :: Heloise Gordan, LPN   Activities of Daily Living In  your present state of  health, do you have any difficulty performing the following activities: 06/21/2020  Hearing? N  Vision? N  Difficulty concentrating or making decisions? N  Walking or climbing stairs? N  Dressing or bathing? N  Doing errands, shopping? N  Preparing Food and eating ? N  Using the Toilet? N  In the past six months, have you accidently leaked urine? N  Do you have problems with loss of bowel control? N  Managing your Medications? N  Managing your Finances? N  Housekeeping or managing your Housekeeping? N  Some recent data might be hidden    Patient Care Team: Claretta Fraise, MD as PCP - General (Family Medicine) Paralee Cancel, MD as Consulting Physician (Orthopedic Surgery) Susa Day, MD as Consulting Physician (Orthopedic Surgery) Sandford Craze, MD as Referring Physician (Dermatology) Carolan Clines, MD (Inactive) as Consulting Physician (Urology)  Indicate any recent Medical Services you may have received from other than [facility] providers in the past year (date may be approximate).     Assessment:   This is a routine wellness examination for Rita Payne.  Hearing/Vision screen Hearing Screening - Comments:: Denies hearing difficulties  Vision Screening - Comments:: Wears glasses - up to date with annual eye exam with Dr Jorja Loa at [facility] in [place] issues and exercise activities discussed: Current Exercise Habits: Home exercise routine, Type of exercise: walking, Time (Minutes): 20, Frequency (Times/Week): 7, Weekly Exercise (Minutes/Week): 140, Intensity: Mild, Exercise limited by: orthopedic condition(s)   Goals Addressed   None    Depression Screen PHQ 2/9 Scores 06/21/2020 06/19/2020 03/08/2020 03/08/2020 12/07/2019 11/22/2019 10/20/2019  PHQ - 2 Score 0 0 0 0 0 0 0  PHQ- 9 Score - - - - - - -    Fall Risk Fall Risk  06/21/2020 06/19/2020 03/08/2020 12/07/2019 11/22/2019  Falls in the past year? 0 0 0 0 0  Number falls in past yr: 0 -  - 0 0  Injury with Fall? 0 - - 0 0  Risk for fall due to : Impaired vision;Orthopedic patient - - No Fall Risks No Fall Risks  Risk for fall due to: Comment - - - - -  Follow up Falls prevention discussed - - Falls evaluation completed Falls evaluation completed    [place]:  Any stairs in or around the home? Yes  If so, are there any without handrails? No  Home free of loose throw rugs in walkways, pet beds, electrical cords, etc? Yes  Adequate lighting in your home to reduce risk of falls? Yes   ASSISTIVE DEVICES UTILIZED TO PREVENT FALLS:  Life alert? No  Use of a cane, walker or w/c? No  Grab bars in the bathroom? Yes  Shower chair or bench in shower? Yes  Elevated toilet seat or a handicapped toilet? No   TIMED UP AND GO:  Was the test performed? No . Telephonic visit  Cognitive Function: MMSE - Mini Mental State Exam 06/11/2017 06/05/2016  Orientation to time 5 5  Orientation to Place 5 5  Registration 3 3  Attention/ Calculation 5 5  Recall 3 3  Language- name 2 objects 2 2  Language- repeat 1 1  Language- follow 3 step command 3 3  Language- read & follow direction 1 1  Write a sentence 1 1  Copy design 1 1  Total score 30 30     6CIT Screen 06/21/2020 06/21/2019 06/18/2018  What Year? 0 points 0 points 0 points  What month?  0 points 0 points 0 points  What time? 0 points 0 points 0 points  Count back from 20 0 points 0 points 0 points  Months in reverse 0 points 0 points 0 points  Repeat phrase 0 points 2 points 2 points  Total Score 0 2 2    Immunizations Immunization History  Administered Date(s) Administered   Fluad Quad(high Dose 65+) 11/30/2018, 12/07/2019   Influenza, High Dose Seasonal PF 11/08/2016, 10/10/2017   Influenza,inj,Quad PF,6+ Mos 10/21/2012, 11/11/2013, 12/23/2014, 10/05/2015   Influenza,inj,quad, With Preservative 10/07/2016   Moderna SARS-COV2 Booster Vaccination 04/25/2020   Moderna Sars-Covid-2  Vaccination 02/18/2019, 03/19/2019, 11/16/2019   Pneumococcal Conjugate-13 06/01/2014   Pneumococcal Polysaccharide-23 07/14/2012   Pneumococcal-Unspecified 01/08/2015   Tdap 07/15/2007    TDAP status: Due, Education has been provided regarding the importance of this vaccine. Advised may receive this vaccine at local pharmacy or Health Dept. Aware to provide a copy of the vaccination record if obtained from local pharmacy or Health Dept. Verbalized acceptance and understanding.  Flu Vaccine status: Up to date  Pneumococcal vaccine status: Up to date  Covid-19 vaccine status: Completed vaccines  Qualifies for Shingles Vaccine? Yes   Zostavax completed No   Shingrix Completed?: No.    Education has been provided regarding the importance of this vaccine. Patient has been advised to call insurance company to determine out of pocket expense if they have not yet received this vaccine. Advised may also receive vaccine at local pharmacy or Health Dept. Verbalized acceptance and understanding.  Screening Tests Health Maintenance  Topic Date Due   FOOT EXAM  08/26/2019   MAMMOGRAM  04/12/2020   TETANUS/TDAP  09/06/2020 (Originally 07/14/2017)   Zoster Vaccines- Shingrix (1 of 2) 09/19/2020 (Originally 04/29/1997)   INFLUENZA VACCINE  08/07/2020   HEMOGLOBIN A1C  12/19/2020   OPHTHALMOLOGY EXAM  04/04/2021   COLONOSCOPY (Pts 45-23yrs Insurance coverage will need to be confirmed)  08/26/2021   DEXA SCAN  11/21/2021   COVID-19 Vaccine  Completed   Hepatitis C Screening  Completed   PNA vac Low Risk Adult  Completed   HPV VACCINES  Aged Out    Health Maintenance  Health Maintenance Due  Topic Date Due   FOOT EXAM  08/26/2019   MAMMOGRAM  04/12/2020    Colorectal cancer screening: Type of screening: Colonoscopy. Completed 08/27/2019. Repeat every 2 years  Mammogram status: Completed 04/13/2019. Repeat every year has appt August 2022  Bone Density status: Completed 11/22/2019. Results  reflect: Bone density results: OSTEOPENIA. Repeat every 2 years.  Lung Cancer Screening: (Low Dose CT Chest recommended if Age 13-80 years, 30 pack-year currently smoking OR have quit w/in 15years.) does not qualify.   Additional Screening:  Hepatitis C Screening: does qualify; Completed 03/23/2015  Vision Screening: Recommended annual ophthalmology exams for early detection of glaucoma and other disorders of the eye. Is the patient up to date with their annual eye exam?  Yes  Who is the provider or what is the name of the office in which the patient attends annual eye exams? [facility] If pt is not established with a provider, would they like to be referred to a provider to establish care? No .   Dental Screening: Recommended annual dental exams for proper oral hygiene  Community Resource Referral / Chronic Care Management: CRR required this visit?  No   CCM required this visit?  No      Plan:     I have personally reviewed and noted the following in  the patient's chart:   Medical and social history Use of alcohol, tobacco or illicit drugs  Current medications and supplements including opioid prescriptions.  Functional ability and status Nutritional status Physical activity Advanced directives List of other physicians Hospitalizations, surgeries, and ER visits in previous 12 months Vitals Screenings to include cognitive, depression, and falls Referrals and appointments  In addition, I have reviewed and discussed with patient certain preventive protocols, quality metrics, and best practice recommendations. A written personalized care plan for preventive services as well as general preventive health recommendations were provided to patient.     Sandrea Hammond, LPN   07/28/8286   Nurse Notes: None

## 2020-06-21 NOTE — Patient Instructions (Signed)
Rita Payne , Thank you for taking time to come for your Medicare Wellness Visit. I appreciate your ongoing commitment to your health goals. Please review the following plan we discussed and let me know if I can assist you in the future.   Screening recommendations/referrals: Colonoscopy: Done 08/27/2019 - Repeat every 2 years Mammogram: Done 04/13/2019 - Keep appointment for repeat in August 2022 Bone Density: Done 11/22/2019 - Repeat every 2 years Recommended yearly ophthalmology/optometry visit for glaucoma screening and checkup Recommended yearly dental visit for hygiene and checkup  Vaccinations: Influenza vaccine: Done 12/07/2019 - Repeat annually Pneumococcal vaccine: Done 07/14/2012 & 06/01/2014 Tdap vaccine: Done 07/15/2007 - Repeat in 10 years *Due Shingles vaccine: Due Shingrix discussed. Please contact your pharmacy for coverage information.    Covid-19:Done 02/18/19, 03/19/19, 11/16/19, & 04/25/20  Advanced directives: Please bring a copy of your health care power of attorney and living will to the office to be added to your chart at your convenience.  Conditions/risks identified: Aim for 30 minutes of exercise or brisk walking each day, drink 6-8 glasses of water and eat lots of fruits and vegetables.  Next appointment: Follow up in one year for your annual wellness visit    Preventive Care 65 Years and Older, Female Preventive care refers to lifestyle choices and visits with your health care provider that can promote health and wellness. What does preventive care include? A yearly physical exam. This is also called an annual well check. Dental exams once or twice a year. Routine eye exams. Ask your health care provider how often you should have your eyes checked. Personal lifestyle choices, including: Daily care of your teeth and gums. Regular physical activity. Eating a healthy diet. Avoiding tobacco and drug use. Limiting alcohol use. Practicing safe sex. Taking low-dose  aspirin every day. Taking vitamin and mineral supplements as recommended by your health care provider. What happens during an annual well check? The services and screenings done by your health care provider during your annual well check will depend on your age, overall health, lifestyle risk factors, and family history of disease. Counseling  Your health care provider may ask you questions about your: Alcohol use. Tobacco use. Drug use. Emotional well-being. Home and relationship well-being. Sexual activity. Eating habits. History of falls. Memory and ability to understand (cognition). Work and work Statistician. Reproductive health. Screening  You may have the following tests or measurements: Height, weight, and BMI. Blood pressure. Lipid and cholesterol levels. These may be checked every 5 years, or more frequently if you are over 81 years old. Skin check. Lung cancer screening. You may have this screening every year starting at age 46 if you have a 30-pack-year history of smoking and currently smoke or have quit within the past 15 years. Fecal occult blood test (FOBT) of the stool. You may have this test every year starting at age 67. Flexible sigmoidoscopy or colonoscopy. You may have a sigmoidoscopy every 5 years or a colonoscopy every 10 years starting at age 71. Hepatitis C blood test. Hepatitis B blood test. Sexually transmitted disease (STD) testing. Diabetes screening. This is done by checking your blood sugar (glucose) after you have not eaten for a while (fasting). You may have this done every 1-3 years. Bone density scan. This is done to screen for osteoporosis. You may have this done starting at age 56. Mammogram. This may be done every 1-2 years. Talk to your health care provider about how often you should have regular mammograms. Talk with your  health care provider about your test results, treatment options, and if necessary, the need for more tests. Vaccines  Your  health care provider may recommend certain vaccines, such as: Influenza vaccine. This is recommended every year. Tetanus, diphtheria, and acellular pertussis (Tdap, Td) vaccine. You may need a Td booster every 10 years. Zoster vaccine. You may need this after age 54. Pneumococcal 13-valent conjugate (PCV13) vaccine. One dose is recommended after age 73. Pneumococcal polysaccharide (PPSV23) vaccine. One dose is recommended after age 58. Talk to your health care provider about which screenings and vaccines you need and how often you need them. This information is not intended to replace advice given to you by your health care provider. Make sure you discuss any questions you have with your health care provider. Document Released: 01/20/2015 Document Revised: 09/13/2015 Document Reviewed: 10/25/2014 Elsevier Interactive Patient Education  2017 [employer] Prevention in the Home Falls can cause injuries. They can happen to people of all ages. There are many things you can do to make your home safe and to help prevent falls. What can I do on the outside of my home? Regularly fix the edges of walkways and driveways and fix any cracks. Remove anything that might make you trip as you walk through a door, such as a raised step or threshold. Trim any bushes or trees on the path to your home. Use bright outdoor lighting. Clear any walking paths of anything that might make someone trip, such as rocks or tools. Regularly check to see if handrails are loose or broken. Make sure that both sides of any steps have handrails. Any raised decks and porches should have guardrails on the edges. Have any leaves, snow, or ice cleared regularly. Use sand or salt on walking paths during winter. Clean up any spills in your garage right away. This includes oil or grease spills. What can I do in the bathroom? Use night lights. Install grab bars by the toilet and in the tub and shower. Do not use towel bars as  grab bars. Use non-skid mats or decals in the tub or shower. If you need to sit down in the shower, use a plastic, non-slip stool. Keep the floor dry. Clean up any water that spills on the floor as soon as it happens. Remove soap buildup in the tub or shower regularly. Attach bath mats securely with double-sided non-slip rug tape. Do not have throw rugs and other things on the floor that can make you trip. What can I do in the bedroom? Use night lights. Make sure that you have a light by your bed that is easy to reach. Do not use any sheets or blankets that are too big for your bed. They should not hang down onto the floor. Have a firm chair that has side arms. You can use this for support while you get dressed. Do not have throw rugs and other things on the floor that can make you trip. What can I do in the kitchen? Clean up any spills right away. Avoid walking on wet floors. Keep items that you use a lot in easy-to-reach places. If you need to reach something above you, use a strong step stool that has a grab bar. Keep electrical cords out of the way. Do not use floor polish or wax that makes floors slippery. If you must use wax, use non-skid floor wax. Do not have throw rugs and other things on the floor that can make you trip. What  can I do with my stairs? Do not leave any items on the stairs. Make sure that there are handrails on both sides of the stairs and use them. Fix handrails that are broken or loose. Make sure that handrails are as long as the stairways. Check any carpeting to make sure that it is firmly attached to the stairs. Fix any carpet that is loose or worn. Avoid having throw rugs at the top or bottom of the stairs. If you do have throw rugs, attach them to the floor with carpet tape. Make sure that you have a light switch at the top of the stairs and the bottom of the stairs. If you do not have them, ask someone to add them for you. What else can I do to help prevent  falls? Wear shoes that: Do not have high heels. Have rubber bottoms. Are comfortable and fit you well. Are closed at the toe. Do not wear sandals. If you use a stepladder: Make sure that it is fully opened. Do not climb a closed stepladder. Make sure that both sides of the stepladder are locked into place. Ask someone to hold it for you, if possible. Clearly mark and make sure that you can see: Any grab bars or handrails. First and last steps. Where the edge of each step is. Use tools that help you move around (mobility aids) if they are needed. These include: Canes. Walkers. Scooters. Crutches. Turn on the lights when you go into a dark area. Replace any light bulbs as soon as they burn out. Set up your furniture so you have a clear path. Avoid moving your furniture around. If any of your floors are uneven, fix them. If there are any pets around you, be aware of where they are. Review your medicines with your doctor. Some medicines can make you feel dizzy. This can increase your chance of falling. Ask your doctor what other things that you can do to help prevent falls. This information is not intended to replace advice given to you by your health care provider. Make sure you discuss any questions you have with your health care provider. Document Released: 10/20/2008 Document Revised: 06/01/2015 Document Reviewed: 01/28/2014 Elsevier Interactive Patient Education  2017 [employer].

## 2020-06-22 ENCOUNTER — Other Ambulatory Visit: Payer: Self-pay | Admitting: *Deleted

## 2020-06-22 ENCOUNTER — Ambulatory Visit: Payer: Medicare Other

## 2020-06-22 MED ORDER — FENOFIBRATE 48 MG PO TABS: 48.0000 mg | ORAL_TABLET | Freq: Every day | ORAL | 1 refills | Status: DC

## 2020-06-26 ENCOUNTER — Telehealth: Payer: Self-pay | Admitting: *Deleted

## 2020-06-26 NOTE — Telephone Encounter (Signed)
PA in process   Key: [id number] - PA Case ID: [id number] - Rx #: [id number] Need help? Call us at [phone] Status Sent to [facility]% solution

## 2020-06-27 ENCOUNTER — Other Ambulatory Visit: Payer: Self-pay | Admitting: Family Medicine

## 2020-06-27 DIAGNOSIS — Z1231 Encounter for screening mammogram for malignant neoplasm of breast: Secondary | ICD-10-CM

## 2020-06-27 NOTE — Telephone Encounter (Signed)
Patient aware.

## 2020-06-27 NOTE — Telephone Encounter (Signed)
Deniedtoday [id number];Review Type:Prior Auth;Appeal Information: Attention:ATTN: [place] [id number]. [place],BD,[id number] SXQKS:[phone] VDI:[phone]; Important - Please read the below note on eAppeals: Please reference the denial letter for information on the rights for an appeal, rationale for the denial, and how to submit an appeal including if any information is needed to support the appeal. Note about urgent situations - Generally, an urgent situation is one which, in the opinion of the provider, the health of the patient may be in serious jeopardy or may experience pain that cannot be adequately controlled while waiting for a decision on the appeal.;

## 2020-06-27 NOTE — Telephone Encounter (Signed)
No appeal, pt will need to pay cash or use OTC eye moisture drops

## 2020-07-04 ENCOUNTER — Telehealth: Payer: Self-pay | Admitting: *Deleted

## 2020-07-04 NOTE — Telephone Encounter (Signed)
Pt aware at last PA note - will get OTC or pay OOP

## 2020-07-05 ENCOUNTER — Other Ambulatory Visit: Payer: Self-pay | Admitting: Family Medicine

## 2020-07-05 DIAGNOSIS — E119 Type 2 diabetes mellitus without complications: Secondary | ICD-10-CM

## 2020-08-14 ENCOUNTER — Ambulatory Visit
Admission: RE | Admit: 2020-08-14 | Discharge: 2020-08-14 | Disposition: A | Discharge status: Home / Self Care | Payer: Medicare Other | Source: Ambulatory Visit | Attending: Family Medicine | Admitting: Family Medicine

## 2020-08-14 ENCOUNTER — Other Ambulatory Visit: Payer: Self-pay

## 2020-08-14 DIAGNOSIS — Z1231 Encounter for screening mammogram for malignant neoplasm of breast: Secondary | ICD-10-CM

## 2020-09-21 ENCOUNTER — Ambulatory Visit (INDEPENDENT_AMBULATORY_CARE_PROVIDER_SITE_OTHER): Payer: Medicare Other | Admitting: Family Medicine

## 2020-09-21 ENCOUNTER — Encounter: Payer: Self-pay | Admitting: Family Medicine

## 2020-09-21 ENCOUNTER — Other Ambulatory Visit: Payer: Self-pay

## 2020-09-21 VITALS — BP 102/63 | HR 91 | Temp 97.8°F | Ht 64.0 in | Wt 176.0 lb

## 2020-09-21 DIAGNOSIS — I10 Essential (primary) hypertension: Secondary | ICD-10-CM | POA: Diagnosis not present

## 2020-09-21 DIAGNOSIS — E785 Hyperlipidemia, unspecified: Secondary | ICD-10-CM

## 2020-09-21 DIAGNOSIS — E119 Type 2 diabetes mellitus without complications: Secondary | ICD-10-CM | POA: Diagnosis not present

## 2020-09-21 DIAGNOSIS — E1169 Type 2 diabetes mellitus with other specified complication: Secondary | ICD-10-CM

## 2020-09-21 DIAGNOSIS — Z780 Asymptomatic menopausal state: Secondary | ICD-10-CM

## 2020-09-21 LAB — BAYER DCA HB A1C WAIVED: HB A1C (BAYER DCA - WAIVED): 5.9 % — ABNORMAL HIGH (ref 4.8–5.6)

## 2020-09-21 MED ORDER — CETIRIZINE HCL 10 MG PO TABS: 10.0000 mg | ORAL_TABLET | Freq: Every day | ORAL | 3 refills | Status: DC

## 2020-09-21 MED ORDER — ESTRADIOL 10 MCG VA TABS: 10.0000 ug | ORAL_TABLET | VAGINAL | 3 refills | Status: DC

## 2020-09-21 MED ORDER — GLIMEPIRIDE 4 MG PO TABS: 4.0000 mg | ORAL_TABLET | Freq: Every day | ORAL | 3 refills | Status: DC

## 2020-09-21 MED ORDER — FENOFIBRATE 48 MG PO TABS: 48.0000 mg | ORAL_TABLET | Freq: Every day | ORAL | 3 refills | Status: DC

## 2020-09-21 MED ORDER — CLONIDINE HCL 0.1 MG PO TABS: 0.1000 mg | ORAL_TABLET | Freq: Two times a day (BID) | ORAL | 3 refills | Status: DC

## 2020-09-21 MED ORDER — CELECOXIB 200 MG PO CAPS: ORAL_CAPSULE | ORAL | 3 refills | Status: DC

## 2020-09-21 MED ORDER — EMPAGLIFLOZIN 25 MG PO TABS: 25.0000 mg | ORAL_TABLET | Freq: Every day | ORAL | 3 refills | Status: DC

## 2020-09-21 NOTE — Progress Notes (Signed)
Subjective:  Patient ID: Rita Payne, female    DOB: 03/27/1945  Age: 75 y.o. MRN: [id number]  CC: Medical Management of Chronic Issues   HPI [name] presents forFollow-up of diabetes. Patient checks blood sugar at home.   100-130 fasting and rarely checking  postprandial Patient denies symptoms such as polyuria, polydipsia, excessive hunger, nausea No significant hypoglycemic spells noted. Medications reviewed. Pt reports taking them regularly without complication/adverse reaction being reported today.    GAbapentin caused grogginess. Had to DC after 2 weeks.   History Tacori has a past medical history of Allergy, Arthritis, Basal cell carcinoma, Cataract, Diarrhea, First degree heart block, GERD (gastroesophageal reflux disease), History of kidney stones, Hyperlipidemia, Hypertension, Leg fracture, Melanoma (HCC), Mild intermittent asthma, SUI (stress urinary incontinence, female), and Type 2 diabetes mellitus (HCC).   She has a past surgical history that includes Tonsillectomy; Abdominal hysterectomy (1987); Cholecystectomy (1989); Knee arthroscopy (Bilateral); Hernia repair; kidney tumor removal  (1986); Blepharoptosis repair (Bilateral, 2009); Total hip arthroplasty (Right, 08/25/2012); Lumbar laminectomy/decompression microdiscectomy (N/A, 12/08/2013); Pubovaginal sling (N/A, 10/03/2014); and Replacement total hip w/  resurfacing implants (Right).   Her family history includes Arrhythmia in her brother; Arthritis/Rheumatoid in her brother and sister; Colon polyps in her sister and sister; Diabetes in her mother, sister, and sister; Heart attack in her brother and mother; Heart disease in her brother, father, mother, and sister; Heart failure in her brother and sister; Stroke in her father, mother, and sister; Vision loss in her brother.She reports that she has been smoking cigarettes. She has a 8.25 pack-year smoking history. She has never used smokeless tobacco. She reports that she  does not drink alcohol and does not use drugs.  Current Outpatient Medications on File Prior to Visit  Medication Sig Dispense Refill   albuterol (PROAIR HFA) 108 (90 Base) MCG/ACT inhaler Inhale 2 puffs into the lungs every 6 (six) hours as needed for wheezing or shortness of breath. Pt needs for chronic asthma 25.5 g 4   aspirin 81 MG tablet Take 1 tablet (81 mg total) by mouth daily. Resume 4 days post-op 30 tablet    atorvastatin (LIPITOR) 80 MG tablet Take 1 tablet (80 mg total) by mouth daily. 90 tablet 3   cholecalciferol (VITAMIN D) 1000 UNITS tablet Take 1,000 Units by mouth daily.     diclofenac Sodium (VOLTAREN) 1 % GEL Apply 2 g topically 4 (four) times daily. 100 g 2   Dulaglutide (TRULICITY) 0.75 MG/0.5ML SOPN Inject 0.75 mg into the skin once a week. INJECT 0.5 ML (0.75 MG) UNDER THE SKIN ONCE A WEEK 6 mL 0   Fluticasone-Salmeterol (ADVAIR DISKUS) 250-50 MCG/DOSE AEPB USE 1 INHALATION EVERY 12 HOURS 180 each 3   glucose blood (FREESTYLE LITE) test strip Test qd. DX: E11.9 100 each 3   omeprazole (PRILOSEC) 40 MG capsule Take 1 capsule (40 mg total) by mouth daily. 90 capsule 3   telmisartan-hydrochlorothiazide (MICARDIS HCT) 80-25 MG tablet Take 1 tablet by mouth every morning. 90 tablet 3   Current Facility-Administered Medications on File Prior to Visit  Medication Dose Route Frequency Provider Last Rate Last Admin   magnesium citrate solution 1 Bottle  1 Bottle Oral Once Jethro Bolus, MD       sodium phosphate (FLEET) 7-19 GM/118ML enema 1 enema  1 enema Rectal Once Jethro Bolus, MD        ROS Review of Systems  Constitutional: Negative.   HENT: Negative.    Eyes:  Negative for visual disturbance.  Respiratory:  Negative for shortness of breath.   Cardiovascular:  Negative for chest pain.  Gastrointestinal:  Negative for abdominal pain.  Musculoskeletal:  Negative for arthralgias.   Objective:  BP 102/63   Pulse 91   Temp 97.8 F (36.6 C)   Ht $R'5\' 4"'xM$   (1.626 m)   Wt 176 lb (79.8 kg)   LMP 04/14/1982   SpO2 98%   BMI 30.21 kg/m   BP Readings from Last 3 Encounters:  09/21/20 102/63  06/19/20 113/63  03/08/20 104/65    Wt Readings from Last 3 Encounters:  09/21/20 176 lb (79.8 kg)  06/21/20 179 lb (81.2 kg)  06/19/20 179 lb (81.2 kg)     Physical Exam Constitutional:      General: She is not in acute distress.    Appearance: She is well-developed.  Cardiovascular:     Rate and Rhythm: Normal rate and regular rhythm.  Pulmonary:     Breath sounds: Normal breath sounds.  Musculoskeletal:        General: Normal range of motion.  Skin:    General: Skin is warm and dry.  Neurological:     Mental Status: She is alert and oriented to person, place, and time.      Assessment & Plan:   Kareema was seen today for medical management of chronic issues.  Diagnoses and all orders for this visit:  Type 2 diabetes mellitus without complication, without long-term current use of insulin (HCC) -     Bayer DCA Hb A1c Waived -     CBC with Differential/Platelet -     empagliflozin (JARDIANCE) 25 MG TABS tablet; Take 1 tablet (25 mg total) by mouth daily before breakfast. -     glimepiride (AMARYL) 4 MG tablet; Take 1 tablet (4 mg total) by mouth daily with breakfast.  Hyperlipidemia associated with type 2 diabetes mellitus (HCC) -     Lipid panel  Essential hypertension -     CMP14+EGFR -     cloNIDine (CATAPRES) 0.1 MG tablet; Take 1 tablet (0.1 mg total) by mouth 2 (two) times daily.  Post-menopausal -     Estradiol (VAGIFEM) 10 MCG TABS vaginal tablet; Place 1 tablet (10 mcg total) vaginally 2 (two) times a week.  Other orders -     celecoxib (CELEBREX) 200 MG capsule; TAKE 1 CAPSULE EVERY 12 HOURS -     cetirizine (ZYRTEC) 10 MG tablet; Take 1 tablet (10 mg total) by mouth daily. -     fenofibrate (TRICOR) 48 MG tablet; Take 1 tablet (48 mg total) by mouth daily.     I have discontinued [name]'s  gabapentin and Xiidra. I have also changed her cetirizine and glimepiride. Additionally, I am having her maintain her cholecalciferol, aspirin, diclofenac Sodium, FREESTYLE LITE, Fluticasone-Salmeterol, telmisartan-hydrochlorothiazide, omeprazole, albuterol, atorvastatin, Trulicity, celecoxib, cloNIDine, empagliflozin, fenofibrate, and Estradiol.  Meds ordered this encounter  Medications   celecoxib (CELEBREX) 200 MG capsule    Sig: TAKE 1 CAPSULE EVERY 12 HOURS    Dispense:  180 capsule    Refill:  3   cetirizine (ZYRTEC) 10 MG tablet    Sig: Take 1 tablet (10 mg total) by mouth daily.    Dispense:  90 tablet    Refill:  3   cloNIDine (CATAPRES) 0.1 MG tablet    Sig: Take 1 tablet (0.1 mg total) by mouth 2 (two) times daily.    Dispense:  180 tablet    Refill:  3   empagliflozin (  JARDIANCE) 25 MG TABS tablet    Sig: Take 1 tablet (25 mg total) by mouth daily before breakfast.    Dispense:  90 tablet    Refill:  3   glimepiride (AMARYL) 4 MG tablet    Sig: Take 1 tablet (4 mg total) by mouth daily with breakfast.    Dispense:  90 tablet    Refill:  3   fenofibrate (TRICOR) 48 MG tablet    Sig: Take 1 tablet (48 mg total) by mouth daily.    Dispense:  90 tablet    Refill:  3   Estradiol (VAGIFEM) 10 MCG TABS vaginal tablet    Sig: Place 1 tablet (10 mcg total) vaginally 2 (two) times a week.    Dispense:  24 tablet    Refill:  3      Follow-up: Return in about 3 months (around 12/21/2020) for diabetes.  Claretta Fraise, M.D.

## 2020-09-22 LAB — CMP14+EGFR
ALT: 21 IU/L (ref 0–32)
AST: 20 IU/L (ref 0–40)
Albumin/Globulin Ratio: 1.8 (ref 1.2–2.2)
Albumin: 4.3 g/dL (ref 3.7–4.7)
Alkaline Phosphatase: 70 IU/L (ref 44–121)
BUN/Creatinine Ratio: 27 (ref 12–28)
BUN: 22 mg/dL (ref 8–27)
Bilirubin Total: 0.4 mg/dL (ref 0.0–1.2)
CO2: 26 mmol/L (ref 20–29)
Calcium: 10 mg/dL (ref 8.7–10.3)
Chloride: 100 mmol/L (ref 96–106)
Creatinine, Ser: 0.82 mg/dL (ref 0.57–1.00)
Globulin, Total: 2.4 g/dL (ref 1.5–4.5)
Glucose: 122 mg/dL — ABNORMAL HIGH (ref 65–99)
Potassium: 4.6 mmol/L (ref 3.5–5.2)
Sodium: 143 mmol/L (ref 134–144)
Total Protein: 6.7 g/dL (ref 6.0–8.5)
eGFR: 75 mL/min/{1.73_m2} (ref >59)

## 2020-09-22 LAB — CBC WITH DIFFERENTIAL/PLATELET
Basophils Absolute: 0 10*3/uL (ref 0.0–0.2)
Basos: 0 %
EOS (ABSOLUTE): 0.2 10*3/uL (ref 0.0–0.4)
Eos: 2 %
Hematocrit: 45.1 % (ref 34.0–46.6)
Hemoglobin: 15.3 g/dL (ref 11.1–15.9)
Immature Grans (Abs): 0 10*3/uL (ref 0.0–0.1)
Immature Granulocytes: 0 %
Lymphocytes Absolute: 2.3 10*3/uL (ref 0.7–3.1)
Lymphs: 24 %
MCH: 31.4 pg (ref 26.6–33.0)
MCHC: 33.9 g/dL (ref 31.5–35.7)
MCV: 92 fL (ref 79–97)
Monocytes Absolute: 0.9 10*3/uL (ref 0.1–0.9)
Monocytes: 9 %
Neutrophils Absolute: 6.2 10*3/uL (ref 1.4–7.0)
Neutrophils: 65 %
Platelets: 166 10*3/uL (ref 150–450)
RBC: 4.88 x10E6/uL (ref 3.77–5.28)
RDW: 13.9 % (ref 11.7–15.4)
WBC: 9.6 10*3/uL (ref 3.4–10.8)

## 2020-09-22 LAB — LIPID PANEL
Chol/HDL Ratio: 5 ratio — ABNORMAL HIGH (ref 0.0–4.4)
Cholesterol, Total: 165 mg/dL (ref 100–199)
HDL: 33 mg/dL — ABNORMAL LOW (ref >39)
LDL Chol Calc (NIH): 81 mg/dL (ref 0–99)
Triglycerides: 314 mg/dL — ABNORMAL HIGH (ref 0–149)
VLDL Cholesterol Cal: 51 mg/dL — ABNORMAL HIGH (ref 5–40)

## 2020-09-23 NOTE — Progress Notes (Signed)
Hello Rita Payne,  Your lab result is normal and/or stable.Some minor variations that are not significant are commonly marked abnormal, but do not represent any medical problem for you.  Best regards, Claretta Fraise, M.D.

## 2020-09-26 DIAGNOSIS — Z20828 Contact with and (suspected) exposure to other viral communicable diseases: Secondary | ICD-10-CM | POA: Diagnosis not present

## 2020-09-26 DIAGNOSIS — Z23 Encounter for immunization: Secondary | ICD-10-CM | POA: Diagnosis not present

## 2020-09-27 ENCOUNTER — Other Ambulatory Visit: Payer: Self-pay | Admitting: Family Medicine

## 2020-09-27 DIAGNOSIS — E119 Type 2 diabetes mellitus without complications: Secondary | ICD-10-CM

## 2020-10-05 DIAGNOSIS — M1712 Unilateral primary osteoarthritis, left knee: Secondary | ICD-10-CM | POA: Diagnosis not present

## 2020-10-05 DIAGNOSIS — M170 Bilateral primary osteoarthritis of knee: Secondary | ICD-10-CM | POA: Diagnosis not present

## 2020-10-05 DIAGNOSIS — M1711 Unilateral primary osteoarthritis, right knee: Secondary | ICD-10-CM | POA: Diagnosis not present

## 2020-10-05 DIAGNOSIS — M25562 Pain in left knee: Secondary | ICD-10-CM | POA: Diagnosis not present

## 2020-11-15 DIAGNOSIS — M1712 Unilateral primary osteoarthritis, left knee: Secondary | ICD-10-CM | POA: Diagnosis not present

## 2020-11-22 DIAGNOSIS — M1712 Unilateral primary osteoarthritis, left knee: Secondary | ICD-10-CM | POA: Diagnosis not present

## 2020-11-29 DIAGNOSIS — M1712 Unilateral primary osteoarthritis, left knee: Secondary | ICD-10-CM | POA: Diagnosis not present

## 2020-12-11 DIAGNOSIS — L570 Actinic keratosis: Secondary | ICD-10-CM | POA: Diagnosis not present

## 2020-12-11 DIAGNOSIS — L920 Granuloma annulare: Secondary | ICD-10-CM | POA: Diagnosis not present

## 2020-12-11 DIAGNOSIS — Z85828 Personal history of other malignant neoplasm of skin: Secondary | ICD-10-CM | POA: Diagnosis not present

## 2020-12-19 ENCOUNTER — Ambulatory Visit (INDEPENDENT_AMBULATORY_CARE_PROVIDER_SITE_OTHER): Payer: Medicare Other | Admitting: Family Medicine

## 2020-12-19 ENCOUNTER — Encounter: Payer: Self-pay | Admitting: Family Medicine

## 2020-12-19 VITALS — BP 95/79 | HR 81 | Temp 98.0°F | Ht 64.0 in | Wt 181.0 lb

## 2020-12-19 DIAGNOSIS — Z23 Encounter for immunization: Secondary | ICD-10-CM

## 2020-12-19 DIAGNOSIS — E119 Type 2 diabetes mellitus without complications: Secondary | ICD-10-CM

## 2020-12-19 DIAGNOSIS — E1169 Type 2 diabetes mellitus with other specified complication: Secondary | ICD-10-CM

## 2020-12-19 DIAGNOSIS — I10 Essential (primary) hypertension: Secondary | ICD-10-CM | POA: Diagnosis not present

## 2020-12-19 DIAGNOSIS — E785 Hyperlipidemia, unspecified: Secondary | ICD-10-CM

## 2020-12-19 LAB — BAYER DCA HB A1C WAIVED: HB A1C (BAYER DCA - WAIVED): 6 % — ABNORMAL HIGH (ref 4.8–5.6)

## 2020-12-19 MED ORDER — TRULICITY 0.75 MG/0.5ML ~~LOC~~ SOAJ: SUBCUTANEOUS | 0 refills | Status: DC

## 2020-12-19 MED ORDER — DOCUSATE SODIUM 100 MG PO CAPS: 100.0000 mg | ORAL_CAPSULE | Freq: Two times a day (BID) | ORAL | 3 refills | Status: DC

## 2020-12-19 NOTE — Progress Notes (Signed)
Subjective:  Patient ID: Rita Payne,  female    DOB: October 20, 1945  Age: 75 y.o.    CC: Medical Management of Chronic Issues   HPI Rita Payne presents for  follow-up of hypertension. Patient has no history of headache chest pain or shortness of breath or recent cough. Patient also denies symptoms of TIA such as numbness weakness lateralizing. Patient denies side effects from medication. States taking it regularly.  Patient also  in for follow-up of elevated cholesterol. Doing well without complaints on current medication. Denies side effects  including myalgia and arthralgia and nausea. Also in today for liver function testing. Currently no chest pain, shortness of breath or other cardiovascular related symptoms noted. No longer taking fenofibrate. It quit being delivered. No problem with the med itself.   Follow-up of diabetes. Patient does check blood sugar at home. Readings run between 100 and 130. Concerned about weight gain. 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 Trying to sell his house.  Patient denies symptoms such as excessive hunger or urinary frequency, excessive hunger, nausea No significant hypoglycemic spells noted. Medications reviewed. Pt reports taking them regularly. Pt. denies complication/adverse reaction today.    History Rita Payne has a past medical history of Allergy, Arthritis, Basal cell carcinoma, Cataract, Diarrhea, First degree heart block, GERD (gastroesophageal reflux disease), History of kidney stones, Hyperlipidemia, Hypertension, Leg fracture, Melanoma (HCC), Mild intermittent asthma, SUI (stress urinary incontinence, female), and Type 2 diabetes mellitus (HCC).   She has a past surgical history that includes Tonsillectomy; Abdominal hysterectomy (1987); Cholecystectomy (1989); Knee arthroscopy (Bilateral); Hernia repair; kidney tumor removal  (1986); Blepharoptosis repair (Bilateral, 2009); Total hip arthroplasty (Right, 08/25/2012); Lumbar  laminectomy/decompression microdiscectomy (N/A, 12/08/2013); Pubovaginal sling (N/A, 10/03/2014); and Replacement total hip w/  resurfacing implants (Right).   Her family history includes Arrhythmia in her brother; Arthritis/Rheumatoid in her brother and sister; Colon polyps in her sister and sister; Diabetes in her mother, sister, and sister; Heart attack in her brother and mother; Heart disease in her brother, father, mother, and sister; Heart failure in her brother and sister; Stroke in her father, mother, and sister; Vision loss in her brother.She reports that she has been smoking cigarettes. She has a 8.25 pack-year smoking history. She has never used smokeless tobacco. She reports that she does not drink alcohol and does not use drugs.  Current Outpatient Medications on File Prior to Visit  Medication Sig Dispense Refill   albuterol (PROAIR HFA) 108 (90 Base) MCG/ACT inhaler Inhale 2 puffs into the lungs every 6 (six) hours as needed for wheezing or shortness of breath. Pt needs for chronic asthma 25.5 g 4   aspirin 81 MG tablet Take 1 tablet (81 mg total) by mouth daily. Resume 4 days post-op 30 tablet    atorvastatin (LIPITOR) 80 MG tablet Take 1 tablet (80 mg total) by mouth daily. 90 tablet 3   celecoxib (CELEBREX) 200 MG capsule TAKE 1 CAPSULE EVERY 12 HOURS 180 capsule 3   cetirizine (ZYRTEC) 10 MG tablet Take 1 tablet (10 mg total) by mouth daily. 90 tablet 3   cholecalciferol (VITAMIN D) 1000 UNITS tablet Take 1,000 Units by mouth daily.     cloNIDine (CATAPRES) 0.1 MG tablet Take 1 tablet (0.1 mg total) by mouth 2 (two) times daily. 180 tablet 3   diclofenac Sodium (VOLTAREN) 1 % GEL Apply 2 g topically 4 (four) times daily. 100 g 2   empagliflozin (JARDIANCE) 25 MG TABS tablet Take 1 tablet (25 mg total)  by mouth daily before breakfast. 90 tablet 3   Estradiol (VAGIFEM) 10 MCG TABS vaginal tablet Place 1 tablet (10 mcg total) vaginally 2 (two) times a week. 24 tablet 3   fenofibrate  (TRICOR) 48 MG tablet Take 1 tablet (48 mg total) by mouth daily. 90 tablet 3   Fluticasone-Salmeterol (ADVAIR DISKUS) 250-50 MCG/DOSE AEPB USE 1 INHALATION EVERY 12 HOURS 180 each 3   glimepiride (AMARYL) 4 MG tablet Take 1 tablet (4 mg total) by mouth daily with breakfast. 90 tablet 3   glucose blood (FREESTYLE LITE) test strip Test qd. DX: E11.9 100 each 3   omeprazole (PRILOSEC) 40 MG capsule Take 1 capsule (40 mg total) by mouth daily. 90 capsule 3   telmisartan-hydrochlorothiazide (MICARDIS HCT) 80-25 MG tablet Take 1 tablet by mouth every morning. 90 tablet 3   Current Facility-Administered Medications on File Prior to Visit  Medication Dose Route Frequency Provider Last Rate Last Admin   magnesium citrate solution 1 Bottle  1 Bottle Oral Once Carolan Clines, MD       sodium phosphate (FLEET) 7-19 GM/118ML enema 1 enema  1 enema Rectal Once Carolan Clines, MD        ROS Review of Systems  Constitutional:  Negative for appetite change, chills, diaphoresis, fatigue and fever.  HENT:  Negative for congestion, ear pain, hearing loss, postnasal drip, rhinorrhea, sore throat and trouble swallowing.   Respiratory:  Negative for cough, chest tightness and shortness of breath.   Cardiovascular:  Negative for chest pain and palpitations.  Gastrointestinal:  Negative for abdominal pain.  Musculoskeletal:  Positive for arthralgias (left thumb, index finger pain. Knees hurt, but shots helped.).  Skin:  Negative for rash.  Neurological:  Positive for tremors.   Objective:  BP 95/79   Pulse 81   Temp 98 F (36.7 C)   Ht $R'5\' 4"'cw$  (1.626 m)   Wt 181 lb (82.1 kg)   LMP 04/14/1982   SpO2 96%   BMI 31.07 kg/m   BP Readings from Last 3 Encounters:  12/19/20 95/79  09/21/20 102/63  06/19/20 113/63    Wt Readings from Last 3 Encounters:  12/19/20 181 lb (82.1 kg)  09/21/20 176 lb (79.8 kg)  06/21/20 179 lb (81.2 kg)     Physical Exam Constitutional:      General: She is not  in acute distress.    Appearance: She is well-developed.  HENT:     Head: Normocephalic and atraumatic.  Eyes:     Conjunctiva/sclera: Conjunctivae normal.     Pupils: Pupils are equal, round, and reactive to light.  Neck:     Thyroid: No thyromegaly.  Cardiovascular:     Rate and Rhythm: Normal rate and regular rhythm.     Heart sounds: Normal heart sounds. No murmur heard. Pulmonary:     Effort: Pulmonary effort is normal. No respiratory distress.     Breath sounds: Normal breath sounds. No wheezing or rales.  Abdominal:     General: Bowel sounds are normal. There is no distension.     Palpations: Abdomen is soft.     Tenderness: There is no abdominal tenderness.  Musculoskeletal:        General: Normal range of motion.     Cervical back: Normal range of motion and neck supple.  Lymphadenopathy:     Cervical: No cervical adenopathy.  Skin:    General: Skin is warm and dry.  Neurological:     Mental Status: She is alert and oriented  to person, place, and time.  Psychiatric:        Behavior: Behavior normal.        Thought Content: Thought content normal.        Judgment: Judgment normal.    Diabetic Foot Exam - Simple   No data filed       Assessment & Plan:   Rita Payne was seen today for medical management of chronic issues.  Diagnoses and all orders for this visit:  Type 2 diabetes mellitus without complication, without long-term current use of insulin (HCC) -     Bayer DCA Hb A1c Waived -     CBC with Differential/Platelet -     Dulaglutide (TRULICITY) 9.29 WK/4.6KM SOPN; INJECT 0.75 MG (0.5 ML) UNDER THE SKIN ONCE A WEEK  Hyperlipidemia associated with type 2 diabetes mellitus (HCC) -     Lipid panel  Essential hypertension -     CBC with Differential/Platelet -     CMP14+EGFR  Other orders -     docusate sodium (COLACE) 100 MG capsule; Take 1 capsule (100 mg total) by mouth 2 (two) times daily. To soften stool  I have changed Rita Look Dominski "Sandy"'s  Trulicity. I am also having her start on docusate sodium. Additionally, I am having her maintain her cholecalciferol, aspirin, diclofenac Sodium, FREESTYLE LITE, Fluticasone-Salmeterol, telmisartan-hydrochlorothiazide, omeprazole, albuterol, atorvastatin, celecoxib, cetirizine, cloNIDine, empagliflozin, glimepiride, fenofibrate, and Estradiol.  Meds ordered this encounter  Medications   Dulaglutide (TRULICITY) 6.38 TR/7.1HA SOPN    Sig: INJECT 0.75 MG (0.5 ML) UNDER THE SKIN ONCE A WEEK    Dispense:  6 mL    Refill:  0   docusate sodium (COLACE) 100 MG capsule    Sig: Take 1 capsule (100 mg total) by mouth 2 (two) times daily. To soften stool    Dispense:  180 capsule    Refill:  3    Renew fenofibrate if lipid panel suggestive of need. Continue the Lipitor.   Follow-up: Return in about 3 months (around 03/19/2021).  Claretta Fraise, M.D.

## 2020-12-20 LAB — CBC WITH DIFFERENTIAL/PLATELET
Basophils Absolute: 0.1 10*3/uL (ref 0.0–0.2)
Basos: 1 %
EOS (ABSOLUTE): 0.2 10*3/uL (ref 0.0–0.4)
Eos: 2 %
Hematocrit: 44.7 % (ref 34.0–46.6)
Hemoglobin: 15.3 g/dL (ref 11.1–15.9)
Immature Grans (Abs): 0 10*3/uL (ref 0.0–0.1)
Immature Granulocytes: 0 %
Lymphocytes Absolute: 2.2 10*3/uL (ref 0.7–3.1)
Lymphs: 22 %
MCH: 31.5 pg (ref 26.6–33.0)
MCHC: 34.2 g/dL (ref 31.5–35.7)
MCV: 92 fL (ref 79–97)
Monocytes Absolute: 0.8 10*3/uL (ref 0.1–0.9)
Monocytes: 8 %
Neutrophils Absolute: 6.5 10*3/uL (ref 1.4–7.0)
Neutrophils: 67 %
Platelets: 180 10*3/uL (ref 150–450)
RBC: 4.86 x10E6/uL (ref 3.77–5.28)
RDW: 14 % (ref 11.7–15.4)
WBC: 9.8 10*3/uL (ref 3.4–10.8)

## 2020-12-20 LAB — CMP14+EGFR
ALT: 21 IU/L (ref 0–32)
AST: 22 IU/L (ref 0–40)
Albumin/Globulin Ratio: 1.7 (ref 1.2–2.2)
Albumin: 4.3 g/dL (ref 3.7–4.7)
Alkaline Phosphatase: 66 IU/L (ref 44–121)
BUN/Creatinine Ratio: 25 (ref 12–28)
BUN: 22 mg/dL (ref 8–27)
Bilirubin Total: 0.4 mg/dL (ref 0.0–1.2)
CO2: 25 mmol/L (ref 20–29)
Calcium: 9.8 mg/dL (ref 8.7–10.3)
Chloride: 99 mmol/L (ref 96–106)
Creatinine, Ser: 0.87 mg/dL (ref 0.57–1.00)
Globulin, Total: 2.5 g/dL (ref 1.5–4.5)
Glucose: 111 mg/dL — ABNORMAL HIGH (ref 70–99)
Potassium: 4.5 mmol/L (ref 3.5–5.2)
Sodium: 139 mmol/L (ref 134–144)
Total Protein: 6.8 g/dL (ref 6.0–8.5)
eGFR: 70 mL/min/{1.73_m2} (ref >59)

## 2020-12-20 LAB — LIPID PANEL
Chol/HDL Ratio: 5.3 ratio — ABNORMAL HIGH (ref 0.0–4.4)
Cholesterol, Total: 170 mg/dL (ref 100–199)
HDL: 32 mg/dL — ABNORMAL LOW (ref >39)
LDL Chol Calc (NIH): 92 mg/dL (ref 0–99)
Triglycerides: 274 mg/dL — ABNORMAL HIGH (ref 0–149)
VLDL Cholesterol Cal: 46 mg/dL — ABNORMAL HIGH (ref 5–40)

## 2021-01-29 ENCOUNTER — Other Ambulatory Visit: Payer: Self-pay | Admitting: Family Medicine

## 2021-01-29 DIAGNOSIS — J4520 Mild intermittent asthma, uncomplicated: Secondary | ICD-10-CM

## 2021-01-29 DIAGNOSIS — E119 Type 2 diabetes mellitus without complications: Secondary | ICD-10-CM

## 2021-02-06 ENCOUNTER — Ambulatory Visit (INDEPENDENT_AMBULATORY_CARE_PROVIDER_SITE_OTHER): Payer: Medicare Other | Admitting: Family

## 2021-02-06 ENCOUNTER — Encounter: Payer: Self-pay | Admitting: Family

## 2021-02-06 VITALS — BP 107/64 | HR 114 | Temp 98.2°F | Ht 64.0 in | Wt 175.0 lb

## 2021-02-06 DIAGNOSIS — E1141 Type 2 diabetes mellitus with diabetic mononeuropathy: Secondary | ICD-10-CM

## 2021-02-06 DIAGNOSIS — R197 Diarrhea, unspecified: Secondary | ICD-10-CM

## 2021-02-06 NOTE — Patient Instructions (Signed)
Diarrhea, Adult ?Diarrhea is frequent loose and watery bowel movements. Diarrhea can make you feel weak and cause you to become dehydrated. Dehydration can make you tired and thirsty, cause you to have a dry mouth, and decrease how often you urinate. ?Diarrhea typically lasts 2-3 days. However, it can last longer if it is a sign of something more serious. It is important to treat your diarrhea as told by your health care provider. ?Follow these instructions at home: ?Eating and drinking ?  ?Follow these recommendations as told by your health care provider: ?Take an oral rehydration solution (ORS). This is an over-the-counter medicine that helps return your body to its normal balance of nutrients and water. It is found at pharmacies and retail stores. ?Drink plenty of fluids, such as water, ice chips, diluted fruit juice, and low-calorie sports drinks. You can drink milk also, if desired. ?Avoid drinking fluids that contain a lot of sugar or caffeine, such as energy drinks, sports drinks, and soda. ?Eat bland, easy-to-digest foods in small amounts as you are able. These foods include bananas, applesauce, rice, lean meats, toast, and crackers. ?Avoid alcohol. ?Avoid spicy or fatty foods. ? ?Medicines ?Take over-the-counter and prescription medicines only as told by your health care provider. ?If you were prescribed an antibiotic medicine, take it as told by your health care provider. Do not stop using the antibiotic even if you start to feel better. ?General instructions ? ?Wash your hands often using soap and water. If soap and water are not available, use a hand sanitizer. Others in the household should wash their hands as well. Hands should be washed: ?After using the toilet or changing a diaper. ?Before preparing, cooking, or serving food. ?While caring for a sick person or while visiting someone in a hospital. ?Drink enough fluid to keep your urine pale yellow. ?Rest at home while you recover. ?Watch your  condition for any changes. ?Take a warm bath to relieve any burning or pain from frequent diarrhea episodes. ?Keep all follow-up visits as told by your health care provider. This is important. ?Contact a health care provider if: ?You have a fever. ?Your diarrhea gets worse. ?You have new symptoms. ?You cannot keep fluids down. ?You feel light-headed or dizzy. ?You have a headache. ?You have muscle cramps. ?Get help right away if: ?You have chest pain. ?You feel extremely weak or you faint. ?You have bloody or black stools or stools that look like tar. ?You have severe pain, cramping, or bloating in your abdomen. ?You have trouble breathing or you are breathing very quickly. ?Your heart is beating very quickly. ?Your skin feels cold and clammy. ?You feel confused. ?You have signs of dehydration, such as: ?Dark urine, very little urine, or no urine. ?Cracked lips. ?Dry mouth. ?Sunken eyes. ?Sleepiness. ?Weakness. ?Summary ?Diarrhea is frequent loose and sometimes watery bowel movements. Diarrhea can make you feel weak and cause you to become dehydrated. ?Drink enough fluids to keep your urine pale yellow. ?Make sure that you wash your hands after using the toilet. If soap and water are not available, use hand sanitizer. ?Contact a health care provider if your diarrhea gets worse or you have new symptoms. ?Get help right away if you have signs of dehydration. ?This information is not intended to replace advice given to you by your health care provider. Make sure you discuss any questions you have with your health care provider. ?Document Revised: 07/05/2020 Document Reviewed: 07/05/2020 ?Elsevier Patient Education ? 2022 [employer] ? ?

## 2021-02-06 NOTE — Progress Notes (Signed)
Subjective:    Patient ID: Rita Payne, female    DOB: 1945/06/27, 76 y.o.   MRN: [id number]  Chief Complaint  Patient presents with   Diarrhea    Since Sunday with burps patient has hx of cdiff    Pt presents to the office today for diarrhea that started Saturday with 10+ episodes a day. She reports every time she eats or drinks she has diarrhea. She has a hx of C Diff and states this feels similar.   She is a diabetic and states her blood glucose is been 80-90's.  Diarrhea  This is a new problem. The current episode started in the past 7 days. The problem occurs more than 10 times per day. The problem has been waxing and waning. The stool consistency is described as Mucous and watery. The patient states that diarrhea awakens her from sleep. Associated symptoms include abdominal pain, bloating, headaches and increased flatus. Pertinent negatives include no chills, coughing, fever or vomiting. There are no known risk factors. She has tried change of diet and anti-motility drug for the symptoms. The treatment provided mild relief.     Review of Systems  Constitutional:  Negative for chills and fever.  Respiratory:  Negative for cough.   Gastrointestinal:  Positive for abdominal pain, bloating, diarrhea and flatus. Negative for vomiting.  Neurological:  Positive for headaches.  All other systems reviewed and are negative.     Objective:   Physical Exam Vitals reviewed.  Constitutional:      General: She is not in acute distress.    Appearance: She is well-developed.  HENT:     Head: Normocephalic and atraumatic.  Eyes:     Pupils: Pupils are equal, round, and reactive to light.  Neck:     Thyroid: No thyromegaly.  Cardiovascular:     Rate and Rhythm: Normal rate and regular rhythm.     Heart sounds: Normal heart sounds. No murmur heard. Pulmonary:     Effort: Pulmonary effort is normal. No respiratory distress.     Breath sounds: Normal breath sounds. No wheezing.   Abdominal:     General: Bowel sounds are normal. There is no distension.     Palpations: Abdomen is soft.     Tenderness: There is no abdominal tenderness.  Musculoskeletal:        General: No tenderness. Normal range of motion.     Cervical back: Normal range of motion and neck supple.  Skin:    General: Skin is warm and dry.  Neurological:     Mental Status: She is alert and oriented to person, place, and time.     Cranial Nerves: No cranial nerve deficit.     Deep Tendon Reflexes: Reflexes are normal and symmetric.  Psychiatric:        Behavior: Behavior normal.        Thought Content: Thought content normal.        Judgment: Judgment normal.     Blood pressure 107/64, pulse (!) 114, temperature 98.2 F (36.8 C), temperature source Temporal, height $RemoveBeforeDE'5\' 4"'[id number]$  (1.626 m), weight 175 lb (79.4 kg), last menstrual period 04/14/1982.      Assessment & Plan:  Rita Payne comes in today with chief complaint of Diarrhea (Since Sunday with burps patient has hx of cdiff )   Diagnosis and orders addressed:  1. Diarrhea, unspecified type Force fluids BRAT diet PT will bring stool panel back today Follow up in 3 days If symptoms worsen needs to be  seen - Cdiff NAA+O+P+Stool Culture - CBC with Differential/Platelet - BMP8+EGFR  2. Type 2 diabetes mellitus with diabetic mononeuropathy, without long-term current use of insulin ([place]) Continue to monitor glucose    Labs pending Health Maintenance reviewed Diet and exercise encouraged  Follow up plan: 3 days with me or PCP   Evelina Dun, FNP

## 2021-02-07 ENCOUNTER — Other Ambulatory Visit: Payer: Medicare Other

## 2021-02-07 DIAGNOSIS — R197 Diarrhea, unspecified: Secondary | ICD-10-CM | POA: Diagnosis not present

## 2021-02-07 LAB — BMP8+EGFR
BUN/Creatinine Ratio: 22 (ref 12–28)
BUN: 23 mg/dL (ref 8–27)
CO2: 22 mmol/L (ref 20–29)
Calcium: 9.7 mg/dL (ref 8.7–10.3)
Chloride: 98 mmol/L (ref 96–106)
Creatinine, Ser: 1.05 mg/dL — ABNORMAL HIGH (ref 0.57–1.00)
Glucose: 142 mg/dL — ABNORMAL HIGH (ref 70–99)
Potassium: 3.7 mmol/L (ref 3.5–5.2)
Sodium: 137 mmol/L (ref 134–144)
eGFR: 56 mL/min/{1.73_m2} — ABNORMAL LOW (ref >59)

## 2021-02-07 LAB — CBC WITH DIFFERENTIAL/PLATELET
Basophils Absolute: 0 10*3/uL (ref 0.0–0.2)
Basos: 0 %
EOS (ABSOLUTE): 0.1 10*3/uL (ref 0.0–0.4)
Eos: 1 %
Hematocrit: 47 % — ABNORMAL HIGH (ref 34.0–46.6)
Hemoglobin: 16 g/dL — ABNORMAL HIGH (ref 11.1–15.9)
Immature Grans (Abs): 0 10*3/uL (ref 0.0–0.1)
Immature Granulocytes: 0 %
Lymphocytes Absolute: 2 10*3/uL (ref 0.7–3.1)
Lymphs: 16 %
MCH: 30.7 pg (ref 26.6–33.0)
MCHC: 34 g/dL (ref 31.5–35.7)
MCV: 90 fL (ref 79–97)
Monocytes Absolute: 1.1 10*3/uL — ABNORMAL HIGH (ref 0.1–0.9)
Monocytes: 9 %
Neutrophils Absolute: 8.9 10*3/uL — ABNORMAL HIGH (ref 1.4–7.0)
Neutrophils: 74 %
Platelets: 199 10*3/uL (ref 150–450)
RBC: 5.21 x10E6/uL (ref 3.77–5.28)
RDW: 13.8 % (ref 11.7–15.4)
WBC: 12.1 10*3/uL — ABNORMAL HIGH (ref 3.4–10.8)

## 2021-02-08 ENCOUNTER — Telehealth: Payer: Self-pay | Admitting: Family Medicine

## 2021-02-08 NOTE — Telephone Encounter (Signed)
Patient aware, 3 day follow up appt scheduled per Christys note

## 2021-02-08 NOTE — Telephone Encounter (Signed)
Labs show minor dehydration. The stool specimen is not complete.

## 2021-02-08 NOTE — Telephone Encounter (Signed)
Please review labs and advise.

## 2021-02-09 ENCOUNTER — Emergency Department (HOSPITAL_COMMUNITY)
Admission: EM | Admit: 2021-02-09 | Discharge: 2021-02-09 | Disposition: A | Discharge status: Home / Self Care | Payer: Medicare Other | Attending: Emergency Medicine | Admitting: Emergency Medicine

## 2021-02-09 ENCOUNTER — Other Ambulatory Visit: Payer: Self-pay

## 2021-02-09 ENCOUNTER — Encounter (HOSPITAL_COMMUNITY): Payer: Self-pay | Admitting: *Deleted

## 2021-02-09 ENCOUNTER — Encounter: Payer: Self-pay | Admitting: Family Medicine

## 2021-02-09 ENCOUNTER — Ambulatory Visit (INDEPENDENT_AMBULATORY_CARE_PROVIDER_SITE_OTHER): Payer: Medicare Other | Admitting: Family Medicine

## 2021-02-09 VITALS — BP 93/56 | HR 78 | Temp 98.0°F | Ht 64.0 in | Wt 173.0 lb

## 2021-02-09 DIAGNOSIS — R531 Weakness: Secondary | ICD-10-CM

## 2021-02-09 DIAGNOSIS — R197 Diarrhea, unspecified: Secondary | ICD-10-CM | POA: Diagnosis not present

## 2021-02-09 DIAGNOSIS — I9589 Other hypotension: Secondary | ICD-10-CM

## 2021-02-09 DIAGNOSIS — E861 Hypovolemia: Secondary | ICD-10-CM

## 2021-02-09 DIAGNOSIS — R9431 Abnormal electrocardiogram [ECG] [EKG]: Secondary | ICD-10-CM | POA: Diagnosis not present

## 2021-02-09 DIAGNOSIS — Z20822 Contact with and (suspected) exposure to covid-19: Secondary | ICD-10-CM | POA: Insufficient documentation

## 2021-02-09 DIAGNOSIS — N3000 Acute cystitis without hematuria: Secondary | ICD-10-CM

## 2021-02-09 DIAGNOSIS — Z7982 Long term (current) use of aspirin: Secondary | ICD-10-CM | POA: Diagnosis not present

## 2021-02-09 LAB — COMPREHENSIVE METABOLIC PANEL
ALT: 22 U/L (ref 0–44)
AST: 23 U/L (ref 15–41)
Albumin: 4.2 g/dL (ref 3.5–5.0)
Alkaline Phosphatase: 64 U/L (ref 38–126)
Anion gap: 12 (ref 5–15)
BUN: 39 mg/dL — ABNORMAL HIGH (ref 8–23)
CO2: 23 mmol/L (ref 22–32)
Calcium: 9.3 mg/dL (ref 8.9–10.3)
Chloride: 103 mmol/L (ref 98–111)
Creatinine, Ser: 1.22 mg/dL — ABNORMAL HIGH (ref 0.44–1.00)
GFR, Estimated: 47 mL/min — ABNORMAL LOW (ref >60)
Glucose, Bld: 113 mg/dL — ABNORMAL HIGH (ref 70–99)
Potassium: 3.5 mmol/L (ref 3.5–5.1)
Sodium: 138 mmol/L (ref 135–145)
Total Bilirubin: 0.7 mg/dL (ref 0.3–1.2)
Total Protein: 7.7 g/dL (ref 6.5–8.1)

## 2021-02-09 LAB — CBC WITH DIFFERENTIAL/PLATELET
Abs Immature Granulocytes: 0.04 10*3/uL (ref 0.00–0.07)
Basophils Absolute: 0 10*3/uL (ref 0.0–0.1)
Basophils Relative: 0 %
Eosinophils Absolute: 0.1 10*3/uL (ref 0.0–0.5)
Eosinophils Relative: 1 %
HCT: 43.1 % (ref 36.0–46.0)
Hemoglobin: 15 g/dL (ref 12.0–15.0)
Immature Granulocytes: 0 %
Lymphocytes Relative: 22 %
Lymphs Abs: 2.1 10*3/uL (ref 0.7–4.0)
MCH: 31.3 pg (ref 26.0–34.0)
MCHC: 34.8 g/dL (ref 30.0–36.0)
MCV: 90 fL (ref 80.0–100.0)
Monocytes Absolute: 0.8 10*3/uL (ref 0.1–1.0)
Monocytes Relative: 8 %
Neutro Abs: 6.5 10*3/uL (ref 1.7–7.7)
Neutrophils Relative %: 69 %
Platelets: 214 10*3/uL (ref 150–400)
RBC: 4.79 MIL/uL (ref 3.87–5.11)
RDW: 14.6 % (ref 11.5–15.5)
WBC: 9.6 10*3/uL (ref 4.0–10.5)
nRBC: 0 % (ref 0.0–0.2)

## 2021-02-09 LAB — URINALYSIS, ROUTINE W REFLEX MICROSCOPIC
Bilirubin Urine: NEGATIVE
Glucose, UA: >=500 mg/dL — AB
Ketones, ur: NEGATIVE mg/dL
Nitrite: POSITIVE — AB
Protein, ur: NEGATIVE mg/dL
Specific Gravity, Urine: 1.01 (ref 1.005–1.030)
pH: 5.5 (ref 5.0–8.0)

## 2021-02-09 LAB — TROPONIN I (HIGH SENSITIVITY): Troponin I (High Sensitivity): 4 ng/L (ref <18)

## 2021-02-09 LAB — RESP PANEL BY RT-PCR (FLU A&B, COVID) ARPGX2
Influenza A by PCR: NEGATIVE
Influenza B by PCR: NEGATIVE
SARS Coronavirus 2 by RT PCR: NEGATIVE

## 2021-02-09 LAB — LIPASE, BLOOD: Lipase: 51 U/L (ref 11–51)

## 2021-02-09 LAB — URINALYSIS, MICROSCOPIC (REFLEX)

## 2021-02-09 LAB — PHOSPHORUS: Phosphorus: 3.9 mg/dL (ref 2.5–4.6)

## 2021-02-09 LAB — MAGNESIUM: Magnesium: 1.8 mg/dL (ref 1.7–2.4)

## 2021-02-09 MED ORDER — CEPHALEXIN 500 MG PO CAPS: 500.0000 mg | ORAL_CAPSULE | Freq: Four times a day (QID) | ORAL | 0 refills | Status: DC

## 2021-02-09 MED ORDER — AZITHROMYCIN 250 MG PO TABS
500.0000 mg | ORAL_TABLET | Freq: Once | ORAL | Status: AC
  Administered 2021-02-09: 500 mg via ORAL
  Filled 2021-02-09: qty 2

## 2021-02-09 MED ORDER — AZITHROMYCIN 250 MG PO TABS: 250.0000 mg | ORAL_TABLET | Freq: Every day | ORAL | 0 refills | Status: DC

## 2021-02-09 MED ORDER — SODIUM CHLORIDE 0.9 % IV BOLUS
1000.0000 mL | Freq: Once | INTRAVENOUS | Status: AC
  Administered 2021-02-09: 1000 mL via INTRAVENOUS

## 2021-02-09 MED ORDER — SODIUM CHLORIDE 0.9 % IV SOLN: 1.0000 g | Freq: Once | INTRAVENOUS | Status: DC

## 2021-02-09 NOTE — ED Notes (Signed)
Patient ambulated with no difficulty. O2 levels remained above 95%

## 2021-02-09 NOTE — ED Notes (Signed)
Small amount of urine sent to lab. Possibly may need more of a sample when anailable.

## 2021-02-09 NOTE — ED Provider Notes (Signed)
[facility] EMERGENCY DEPARTMENT Provider Note   CSN: [id number] Arrival date & time: 02/09/21  1409     History  Chief Complaint  Patient presents with   Hypotension    Rita Payne is a 76 y.o. female.  The history is provided by a relative and the patient.   Patient presents with diarrhea x1 week.  She was seen in her primary care office for this 02/06/2021 and she had stool cultures obtained that have not resulted anything remarkable.  States that she is having greater than 10 episodes of diarrhea daily, stool is involving mucus but no blood.  Describes it as watery consistency.  Sometimes it wakes her up from her sleep, also having abdominal bloating and some tenderness in the mornings when she has their first episode of diarrhea.  She has tried antimotility and diet but none of these have improved her symptoms.  History of C. difficile in 2016 but has not had any antibiotics recently.  She was seen in her primary care office earlier today for weakness, at that time she was found to be hypertensive.  PCP advised going to the ED for additional work-up.  She also had a slight bump in creatinine about a week ago.    Home Medications Prior to Admission medications   Medication Sig Start Date End Date Taking? Authorizing Provider  albuterol (VENTOLIN HFA) 108 (90 Base) MCG/ACT inhaler USE 2 INHALATIONS EVERY 6 HOURS AS NEEDED FOR WHEEZING OR SHORTNESS OF BREATH, NEED FOR CHRONIC ASTHMA. Patient taking differently: Inhale 2 puffs into the lungs every 6 (six) hours as needed for shortness of breath. 01/30/21  Yes Claretta Fraise, MD  aspirin 81 MG tablet Take 1 tablet (81 mg total) by mouth daily. Resume 4 days post-op 12/09/13  Yes Lacie Draft M, PA-C  atorvastatin (LIPITOR) 80 MG tablet Take 1 tablet (80 mg total) by mouth daily. 03/08/20  Yes Claretta Fraise, MD  azithromycin (ZITHROMAX) 250 MG tablet Take 1 tablet (250 mg total) by mouth daily. Take first 2 tablets together, then 1 every  day until finished. 02/09/21  Yes Sherrill Raring, PA-C  celecoxib (CELEBREX) 200 MG capsule TAKE 1 CAPSULE EVERY 12 HOURS 09/21/20  Yes Stacks, Cletus Gash, MD  cephALEXin (KEFLEX) 500 MG capsule Take 1 capsule (500 mg total) by mouth 4 (four) times daily. 02/09/21  Yes Sherrill Raring, PA-C  cetirizine (ZYRTEC) 10 MG tablet Take 1 tablet (10 mg total) by mouth daily. 09/21/20  Yes Stacks, Cletus Gash, MD  cholecalciferol (VITAMIN D) 1000 UNITS tablet Take 1,000 Units by mouth daily.   Yes [provider]  cloNIDine (CATAPRES) 0.1 MG tablet Take 1 tablet (0.1 mg total) by mouth 2 (two) times daily. 09/21/20  Yes Stacks, Cletus Gash, MD  diclofenac Sodium (VOLTAREN) 1 % GEL Apply 2 g topically 4 (four) times daily. 02/23/19  Yes Hawks, Christy A, FNP  docusate sodium (COLACE) 100 MG capsule Take 1 capsule (100 mg total) by mouth 2 (two) times daily. To soften stool 12/19/20  Yes Stacks, Cletus Gash, MD  Dulaglutide (TRULICITY) 2.35 TI/1.4ER SOPN INJECT 0.75 MG (0.5 ML) UNDER THE SKIN ONCE A WEEK 12/19/20  Yes Claretta Fraise, MD  empagliflozin (JARDIANCE) 25 MG TABS tablet Take 1 tablet (25 mg total) by mouth daily before breakfast. 09/21/20  Yes Stacks, Cletus Gash, MD  Estradiol (VAGIFEM) 10 MCG TABS vaginal tablet Place 1 tablet (10 mcg total) vaginally 2 (two) times a week. 09/21/20  Yes Stacks, Cletus Gash, MD  fenofibrate (TRICOR) 48 MG tablet Take 1  tablet (48 mg total) by mouth daily. 09/21/20  Yes Stacks, Cletus Gash, MD  Fluticasone-Salmeterol (ADVAIR DISKUS) 250-50 MCG/DOSE AEPB USE 1 INHALATION EVERY 12 HOURS 03/08/20  Yes Claretta Fraise, MD  glimepiride (AMARYL) 4 MG tablet Take 1 tablet (4 mg total) by mouth daily with breakfast. 09/21/20  Yes Claretta Fraise, MD  omeprazole (PRILOSEC) 40 MG capsule Take 1 capsule (40 mg total) by mouth daily. 03/08/20  Yes Stacks, Cletus Gash, MD  telmisartan-hydrochlorothiazide (MICARDIS HCT) 80-25 MG tablet Take 1 tablet by mouth every morning. 03/08/20  Yes Claretta Fraise, MD  glucose blood (FREESTYLE LITE)  test strip USE TO TEST DAILY 01/30/21   Claretta Fraise, MD      Allergies    Ivp dye [iodinated contrast media], Shellfish allergy, Vasotec [enalapril], Adhesive [tape], Amlodipine, Gabapentin, Iodine, and Pyridium [phenazopyridine hcl]    Review of Systems   Review of Systems  Gastrointestinal:  Positive for diarrhea.   Physical Exam Updated Vital Signs BP (!) 121/54    Pulse 80    Temp 98.3 F (36.8 C) (Oral)    Resp 12    Ht 5\' 4"  (1.626 m)    Wt 78.5 kg    LMP 04/14/1982    SpO2 97%    BMI 29.70 kg/m  Physical Exam Vitals and nursing note reviewed. Exam conducted with a chaperone present.  Constitutional:      Appearance: Normal appearance.  HENT:     Head: Normocephalic and atraumatic.     Mouth/Throat:     Mouth: Mucous membranes are dry.  Eyes:     General: No scleral icterus.       Right eye: No discharge.        Left eye: No discharge.     Extraocular Movements: Extraocular movements intact.     Pupils: Pupils are equal, round, and reactive to light.  Cardiovascular:     Rate and Rhythm: Normal rate and regular rhythm.     Pulses: Normal pulses.     Heart sounds: Normal heart sounds. No murmur heard.   No friction rub. No gallop.  Pulmonary:     Effort: Pulmonary effort is normal. No respiratory distress.     Breath sounds: Normal breath sounds.  Abdominal:     General: Abdomen is flat. Bowel sounds are normal. There is no distension.     Palpations: Abdomen is soft.     Tenderness: There is no abdominal tenderness.  Skin:    General: Skin is warm and dry.     Coloration: Skin is not jaundiced.  Neurological:     Mental Status: She is alert. Mental status is at baseline.     Coordination: Coordination normal.    ED Results / Procedures / Treatments   Labs (all labs ordered are listed, but only abnormal results are displayed) Labs Reviewed  COMPREHENSIVE METABOLIC PANEL - Abnormal; Notable for the following components:      Result Value   Glucose, Bld 113  (*)    BUN 39 (*)    Creatinine, Ser 1.22 (*)    GFR, Estimated 47 (*)    All other components within normal limits  URINALYSIS, ROUTINE W REFLEX MICROSCOPIC - Abnormal; Notable for the following components:   Glucose, UA >=500 (*)    Hgb urine dipstick TRACE (*)    Nitrite POSITIVE (*)    Leukocytes,Ua LARGE (*)    All other components within normal limits  URINALYSIS, MICROSCOPIC (REFLEX) - Abnormal; Notable for the following components:  Bacteria, UA MANY (*)    All other components within normal limits  RESP PANEL BY RT-PCR (FLU A&B, COVID) ARPGX2  GASTROINTESTINAL PANEL BY PCR, STOOL (REPLACES STOOL CULTURE)  C DIFFICILE QUICK SCREEN W PCR REFLEX    URINE CULTURE  CBC WITH DIFFERENTIAL/PLATELET  LIPASE, BLOOD  MAGNESIUM  PHOSPHORUS  TROPONIN I (HIGH SENSITIVITY)    EKG EKG Interpretation  Date/Time:  Friday February 09 2021 14:33:59 EST Ventricular Rate:  83 PR Interval:  243 QRS Duration: 95 QT Interval:  376 QTC Calculation: 442 R Axis:   73 Text Interpretation: Sinus rhythm Prolonged PR interval Borderline low voltage, extremity leads Borderline ST elevation, inferior leads Baseline wander in lead(s) V3 Confirmed by Fredia Sorrow [phone]) on 02/09/2021 2:59:01 PM  Radiology No results found.  Procedures Procedures    Medications Ordered in ED Medications  sodium chloride 0.9 % bolus 1,000 mL (0 mLs Intravenous Stopped 02/09/21 1641)  sodium chloride 0.9 % bolus 1,000 mL (0 mLs Intravenous Stopped 02/09/21 1813)  azithromycin (ZITHROMAX) tablet 500 mg (500 mg Oral Given 02/09/21 1759)    ED Course/ Medical Decision Making/ A&P Clinical Course as of 02/09/21 1832  Fri Feb 09, 2021  1721 Ambulated without difficulty at 95% [HS]    Clinical Course User Index [HS] Sherrill Raring, PA-C                           Medical Decision Making Amount and/or Complexity of Data Reviewed Labs: ordered.  Risk Prescription drug management.   This patient presents to  the ED for concern of hypotension, this involves an extensive number of treatment options, and is a complaint that carries with it a high risk of complications and morbidity.  The differential diagnosis includes volume depletion, vasovagal, other   Co morbidities that complicate the patient evaluation: N/A   Additional history obtained: -Additional history obtained from the patient sister who is at bedside.  I also reviewed the patient's PCP note from earlier today.  Also reviewed the labs from 02/06/2021.  Patient creatinine has increased since then.  Stool cultures are still pending. -External records from outside source obtained and reviewed including: Chart review including previous notes, labs, imaging, consultation notes   Lab Tests: -I ordered, reviewed, and interpreted labs.  The pertinent results include:   Patient urine is notable for nitrite positive leukocyte positive consistent with UTI.  Is likely secondary due to the constant diarrhea, will give antibiotics.  Lipase within normal limits, phosphorus magnesium within normal limits, no gross electrolyte derangement, leukocytosis or anemia.  Creatinine is slightly elevated at 1.22, BUN also elevated at 39.  This consistent with mild AKI secondary to dehydration. Trop low.   EKG -Normal sinus rhythm, slight ST elevation in the inferior leads.   Medicines ordered and prescription drug management: -I ordered medication including 2L IVF, azithromycin  for dehydration  -Reevaluation of the patient after these medicines showed that the patient improved -I have reviewed the patients home medicines and have made adjustments as needed   ED Course: 76 year old female presenting due to dehydration and persistent diarrhea.  She was hypotensive in her PCP office prompting her visit to the ED, patient denies feeling lightheaded or dizzy.  Not having chest pain or shortness of breath.  EKG had some ST elevations noted on the monitor when I was  in the room, some inferior ST elevation but no troponin elevation, chest pain, shortness of breath so I do not  have a high suspicion this is ACS related.  Patient was able to ambulate comfortably after 2 L of fluid, remained with a pulse ox reading of greater than 95%.  Has not had any diarrhea in the ED to collect.  Work-up is notable for UTI, there is a slight AKI as well likely secondary to the diarrhea.  I did discuss admission with the patient for rehydration, patient states she feels comfortable going home and does not desire to be admitted.  Given she is able to ambulate, tolerate oral intake, has good follow-up and good home support I think this is reasonable decision.  We will discharge the patient with antibiotics for the UTI, the throat for the mucoid diarrhea greater than 1 week and have her follow-up with her PCP on Monday.  Strict return precautions discussed and agreed on.    Cardiac Monitoring: The patient was maintained on a cardiac monitor.  I personally viewed and interpreted the cardiac monitored which showed an underlying rhythm of: NSR   Reevaluation: After the interventions noted above, I reevaluated the patient and found that they have :resolved    Discussed HPI, physical exam and plan of care for this patient with attending Nanda Quinton. The attending physician evaluated this patient as part of a shared visit and agrees with plan of care.           Final Clinical Impression(s) / ED Diagnoses Final diagnoses:  Diarrhea, unspecified type  Acute cystitis without hematuria    Rx / DC Orders ED Discharge Orders          Ordered    azithromycin (ZITHROMAX) 250 MG tablet  Daily        02/09/21 1734    cephALEXin (KEFLEX) 500 MG capsule  4 times daily        02/09/21 1747              Sherrill Raring, PA-C 02/09/21 1833    Long, Wonda Olds, MD 02/20/21 [phone]

## 2021-02-09 NOTE — Progress Notes (Signed)
Subjective:  Patient ID: Rita Payne, female    DOB: 04-28-1945, 76 y.o.   MRN: [id number]  Patient Care Team: Claretta Fraise, MD as PCP - General (Family Medicine) Paralee Cancel, MD as Consulting Physician (Orthopedic Surgery) Susa Day, MD as Consulting Physician (Orthopedic Surgery) Sandford Craze, MD as Referring Physician (Dermatology) Carolan Clines, MD (Inactive) as Consulting Physician (Urology)   Chief Complaint:  Diarrhea   HPI: Rita Payne is a 76 y.o. female presenting on 02/09/2021 for Diarrhea   Pt presents today for ongoing diarrhea since Saturday. She was seen in office 02/06/2021 and labs were obtained and revealed WBC 12.1, creatinine 1.05, Hgb 16, Hct 47. Stool specimen was collected, negative for E. Coli and C. Diff, others still pending. She reports continued diarrhea, at least 15-20 stools per day. She reports increasing weakness, fatigue, and headache. States she has a decreased appetite with decreased fluid intake. No fever, chills, abdominal pain, melena, or hematochezia.   Diarrhea  This is a recurrent problem. The current episode started in the past 7 days. The problem occurs more than 10 times per day. The problem has been unchanged. The stool consistency is described as Mucous and watery. The patient states that diarrhea awakens her from sleep. Associated symptoms include bloating and headaches. Pertinent negatives include no abdominal pain, arthralgias, chills, coughing, fever, increased  flatus, myalgias, sweats, URI, vomiting or weight loss. She has tried change of diet and anti-motility drug for the symptoms. The treatment provided no relief. C. Diff   Relevant past medical, surgical, family, and social history reviewed and updated as indicated.  Allergies and medications reviewed and updated. Data reviewed: Chart in Epic.   Past Medical History:  Diagnosis Date   Allergy    Arthritis    Basal cell carcinoma    Cataract    Diarrhea     First degree heart block    GERD (gastroesophageal reflux disease)    History of kidney stones    Hyperlipidemia    Hypertension    Leg fracture    Melanoma (HCC)    Mild intermittent asthma    SUI (stress urinary incontinence, female)    Type 2 diabetes mellitus ([place])     Past Surgical History:  Procedure Laterality Date   ABDOMINAL HYSTERECTOMY  1987   BELPHAROPTOSIS REPAIR Bilateral 2009   9    CHOLECYSTECTOMY  1989   HERNIA REPAIR     kidney tumor removal   1986   KNEE ARTHROSCOPY Bilateral    LUMBAR LAMINECTOMY/DECOMPRESSION MICRODISCECTOMY N/A 12/08/2013   Procedure: LUMBAR DECOMPRESSION L4-L5,  L3-L4 ;  Surgeon: Johnn Hai, MD;  Location: [facility];  Service: Orthopedics;  Laterality: N/A;   PUBOVAGINAL SLING N/A 10/03/2014   Procedure: Gaynelle Arabian;  Surgeon: Carolan Clines, MD;  Location: [facility];  Service: Urology;  Laterality: N/A;   REPLACEMENT TOTAL HIP W/  RESURFACING IMPLANTS Right    TONSILLECTOMY     TOTAL HIP ARTHROPLASTY Right 08/25/2012   Procedure: RIGHT TOTAL HIP ARTHROPLASTY ANTERIOR APPROACH;  Surgeon: [name], MD;  Location: [facility];  Service: Orthopedics;  Laterality: Right;    Social History   Socioeconomic History   Marital status: Widowed    Spouse name: Not on file   Number of children: 2   Years of education: some college   Highest education level: Some college, no degree  Occupational History   Occupation: Paralegal   Tobacco Use   Smoking status: Every Day  Packs/day: 0.25    Years: 33.00    Pack years: 8.25    Types: Cigarettes   Smokeless tobacco: Never  Vaping Use   Vaping Use: Never used  Substance and Sexual Activity   Alcohol use: No   Drug use: No   Sexual activity: Not Currently  Other Topics Concern   Not on file  Social History Narrative   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 Son in [place], daughter in [place]. 2 sisters live nearby   Social  Determinants of Health   Financial Resource Strain: Low Risk    Difficulty of Paying Living Expenses: Not hard at all  Food Insecurity: No Food Insecurity   Worried About Charity fundraiser in the Last Year: Never true   Arboriculturist in the Last Year: Never true  Transportation Needs: No Transportation Needs   Lack of Transportation (Medical): No   Lack of Transportation (Non-Medical): No  Physical Activity: Insufficiently Active   Days of Exercise per Week: 7 days   Minutes of Exercise per Session: 10 min  Stress: No Stress Concern Present   Feeling of Stress : Only a little  Social Connections: Socially Isolated   Frequency of Communication with Friends and Family: More than three times a week   Frequency of Social Gatherings with Friends and Family: More than three times a week   Attends Religious Services: Never   Marine scientist or Organizations: No   Attends Archivist Meetings: Never   Marital Status: Widowed  Intimate Partner Violence: Not At Risk   Fear of Current or Ex-Partner: No   Emotionally Abused: No   Physically Abused: No   Sexually Abused: No    Outpatient Encounter Medications as of 02/09/2021  Medication Sig   albuterol (VENTOLIN HFA) 108 (90 Base) MCG/ACT inhaler USE 2 INHALATIONS EVERY 6 HOURS AS NEEDED FOR WHEEZING OR SHORTNESS OF BREATH, NEED FOR CHRONIC ASTHMA.   aspirin 81 MG tablet Take 1 tablet (81 mg total) by mouth daily. Resume 4 days post-op   atorvastatin (LIPITOR) 80 MG tablet Take 1 tablet (80 mg total) by mouth daily.   celecoxib (CELEBREX) 200 MG capsule TAKE 1 CAPSULE EVERY 12 HOURS   cetirizine (ZYRTEC) 10 MG tablet Take 1 tablet (10 mg total) by mouth daily.   cholecalciferol (VITAMIN D) 1000 UNITS tablet Take 1,000 Units by mouth daily.   cloNIDine (CATAPRES) 0.1 MG tablet Take 1 tablet (0.1 mg total) by mouth 2 (two) times daily.   diclofenac Sodium (VOLTAREN) 1 % GEL Apply 2 g topically 4 (four) times daily.    docusate sodium (COLACE) 100 MG capsule Take 1 capsule (100 mg total) by mouth 2 (two) times daily. To soften stool   Dulaglutide (TRULICITY) 1.44 RX/5.4MG SOPN INJECT 0.75 MG (0.5 ML) UNDER THE SKIN ONCE A WEEK   empagliflozin (JARDIANCE) 25 MG TABS tablet Take 1 tablet (25 mg total) by mouth daily before breakfast.   Estradiol (VAGIFEM) 10 MCG TABS vaginal tablet Place 1 tablet (10 mcg total) vaginally 2 (two) times a week.   fenofibrate (TRICOR) 48 MG tablet Take 1 tablet (48 mg total) by mouth daily.   Fluticasone-Salmeterol (ADVAIR DISKUS) 250-50 MCG/DOSE AEPB USE 1 INHALATION EVERY 12 HOURS   glimepiride (AMARYL) 4 MG tablet Take 1 tablet (4 mg total) by mouth daily with breakfast.   glucose blood (FREESTYLE LITE) test strip USE TO TEST DAILY   omeprazole (PRILOSEC) 40 MG capsule  Take 1 capsule (40 mg total) by mouth daily.   telmisartan-hydrochlorothiazide (MICARDIS HCT) 80-25 MG tablet Take 1 tablet by mouth every morning.   Facility-Administered Encounter Medications as of 02/09/2021  Medication   magnesium citrate solution 1 Bottle   sodium phosphate (FLEET) 7-19 GM/118ML enema 1 enema    Allergies  Allergen Reactions   Ivp Dye [Iodinated Contrast Media] Anaphylaxis   Shellfish Allergy Anaphylaxis   Vasotec [Enalapril] Shortness Of Breath   Adhesive [Tape] Other (See Comments)    blisters   Amlodipine     swelling   Gabapentin Other (See Comments)    drowsiness   Iodine Other (See Comments)    Iodine that is applied to skin causes blisters    Pyridium [Phenazopyridine Hcl]     blisters    Review of Systems  Constitutional:  Positive for activity change, appetite change and fatigue. Negative for chills, diaphoresis, fever, unexpected weight change and weight loss.  Respiratory:  Negative for cough and shortness of breath.   Cardiovascular:  Negative for chest pain, palpitations and leg swelling.  Gastrointestinal:  Positive for abdominal distention, bloating and diarrhea.  Negative for abdominal pain, anal bleeding, blood in stool, constipation, flatus, nausea, rectal pain and vomiting.  Genitourinary:  Positive for decreased urine volume. Negative for difficulty urinating.  Musculoskeletal:  Negative for arthralgias and myalgias.  Neurological:  Positive for weakness (generalized) and headaches. Negative for dizziness, tremors, seizures, syncope, facial asymmetry, speech difficulty, light-headedness and numbness.  Psychiatric/Behavioral:  Negative for confusion.   All other systems reviewed and are negative.      Objective:  BP (!) 93/56    Pulse 78    Temp 98 F (36.7 C) (Temporal)    Ht _0  (1.626 m)    Wt 173 lb (78.5 kg)    LMP 04/14/1982    BMI 29.70 kg/m    Wt Readings from Last 3 Encounters:  02/09/21 173 lb (78.5 kg)  02/06/21 175 lb (79.4 kg)  12/19/20 181 lb (82.1 kg)    Physical Exam  Results for orders placed or performed in visit on 02/06/21  Cdiff NAA+O+P+Stool Culture   Specimen: Stool   ST  Result Value Ref Range   Salmonella/Shigella Screen WILL FOLLOW    Campylobacter Culture WILL FOLLOW    E coli, Shiga toxin Assay Negative Negative   OVA + PARASITE EXAM WILL FOLLOW    Toxigenic C. Difficile by PCR Negative Negative  CBC with Differential/Platelet  Result Value Ref Range   WBC 12.1 (H) 3.4 - 10.8 x10E3/uL   RBC 5.21 3.77 - 5.28 x10E6/uL   Hemoglobin 16.0 (H) 11.1 - 15.9 g/dL   Hematocrit 47.0 (H) 34.0 - 46.6 %   MCV 90 79 - 97 fL   MCH 30.7 26.6 - 33.0 pg   MCHC 34.0 31.5 - 35.7 g/dL   RDW 13.8 11.7 - 15.4 %   Platelets 199 150 - 450 x10E3/uL   Neutrophils 74 Not Estab. %   Lymphs 16 Not Estab. %   Monocytes 9 Not Estab. %   Eos 1 Not Estab. %   Basos 0 Not Estab. %   Neutrophils Absolute 8.9 (H) 1.4 - 7.0 x10E3/uL   Lymphocytes Absolute 2.0 0.7 - 3.1 x10E3/uL   Monocytes Absolute 1.1 (H) 0.1 - 0.9 x10E3/uL   EOS (ABSOLUTE) 0.1 0.0 - 0.4 x10E3/uL   Basophils Absolute 0.0 0.0 - 0.2 x10E3/uL   Immature  Granulocytes 0 Not Estab. %   Immature Grans (Abs)  0.0 0.0 - 0.1 x10E3/uL  BMP8+EGFR  Result Value Ref Range   Glucose 142 (H) 70 - 99 mg/dL   BUN 23 8 - 27 mg/dL   Creatinine, Ser 1.05 (H) 0.57 - 1.00 mg/dL   eGFR 56 (L) >59 mL/min/1.73   BUN/Creatinine Ratio 22 12 - 28   Sodium 137 134 - 144 mmol/L   Potassium 3.7 3.5 - 5.2 mmol/L   Chloride 98 96 - 106 mmol/L   CO2 22 20 - 29 mmol/L   Calcium 9.7 8.7 - 10.3 mg/dL       Pertinent labs & imaging results that were available during my care of the patient were reviewed by me and considered in my medical decision making.  Assessment & Plan:  Rita Payne was seen today for diarrhea.  Diagnoses and all orders for this visit:  Diarrhea in adult patient Hypotension due to hypovolemia General weakness 15-20 diarrhea stools per day for over 5 days. Hypotensive and weak in office. Previous labs at onset of illness revealed slightly declined renal function. Due to ongoing and worsening symptoms, pt to go to ED for evaluation and treatment. Stool panel revealed negative C-Diff and E. Coli, others still pending. WBC was elevated at 12.1.     Continue all other maintenance medications.  Follow up plan: To ED for evaluation and treatment. Needs IV hydration due to renal function decline and hypotension due to ongoing diarrhea.    The above assessment and management plan was discussed with the patient. The patient verbalized understanding of and has agreed to the management plan. Patient is aware to call the clinic if they develop any new symptoms or if symptoms persist or worsen. Patient is aware when to return to the clinic for a follow-up visit. Patient educated on when it is appropriate to go to the emergency department.   Monia Pouch, FNP-C [facility] [phone]

## 2021-02-09 NOTE — Discharge Instructions (Addendum)
You are given 500 mg of azithromycin while in the ED today.  Please take 1 azithromycin for the next week.  Follow-up with your primary on Monday as discussed.  Drink plenty of fluids, please follow the brat diet.  Information attached above.  Return to the ED if things change or worsen over the weekend.  Additionally, you do have a urinary tract infection.  Take Keflex 4 times daily for the next 5 days.

## 2021-02-09 NOTE — ED Triage Notes (Signed)
Pt sent here from PCP's office due to pt's BP was 84/40's.  Pt with diarrhea since Sunday, denies any N/V. + abd pain.

## 2021-02-12 LAB — URINE CULTURE: Culture: >=100000 — AB

## 2021-02-12 LAB — CDIFF NAA+O+P+STOOL CULTURE
E coli, Shiga toxin Assay: NEGATIVE
Toxigenic C. Difficile by PCR: NEGATIVE

## 2021-02-13 ENCOUNTER — Ambulatory Visit: Payer: Medicare Other | Admitting: Family Medicine

## 2021-02-13 ENCOUNTER — Telehealth: Payer: Self-pay

## 2021-02-13 NOTE — Telephone Encounter (Signed)
Post ED Visit - Positive Culture Follow-up  Culture report reviewed by antimicrobial stewardship pharmacist: [place] Team [x]  Bertis Ruddy, Pharm.D. []  Heide Guile, Pharm.D., BCPS AQ-ID []  Parks Neptune, Pharm.D., BCPS []  Alycia Rossetti, Pharm.D., BCPS []  [place], Pharm.D., BCPS, AAHIVP []  Legrand Como, Pharm.D., BCPS, AAHIVP []  Salome Arnt, PharmD, BCPS []  Johnnette Gourd, PharmD, BCPS []  Hughes Better, PharmD, BCPS []  Leeroy Cha, PharmD []  Laqueta Linden, PharmD, BCPS []  Albertina Parr, PharmD  [place] Team []  Leodis Sias, PharmD []  Lindell Spar, PharmD []  Royetta Asal, PharmD []  Graylin Shiver, Rph []  Rema Fendt) Glennon Mac, PharmD []  Arlyn Dunning, PharmD []  Netta Cedars, PharmD []  Dia Sitter, PharmD []  Leone Haven, PharmD []  Gretta Arab, PharmD []  Theodis Shove, PharmD []  Peggyann Juba, PharmD []  Reuel Boom, PharmD   Positive urine culture Treated with Azithromycin and Cephalexin, organism sensitive to the same and no further patient follow-up is required at this time.  Glennon Hamilton 02/13/2021, 10:48 AM

## 2021-02-16 ENCOUNTER — Ambulatory Visit (INDEPENDENT_AMBULATORY_CARE_PROVIDER_SITE_OTHER): Payer: Medicare Other | Admitting: Family Medicine

## 2021-02-16 ENCOUNTER — Encounter: Payer: Self-pay | Admitting: Family Medicine

## 2021-02-16 VITALS — BP 96/57 | HR 77 | Temp 97.5°F | Ht 64.0 in | Wt 174.8 lb

## 2021-02-16 DIAGNOSIS — E162 Hypoglycemia, unspecified: Secondary | ICD-10-CM

## 2021-02-16 DIAGNOSIS — R197 Diarrhea, unspecified: Secondary | ICD-10-CM | POA: Diagnosis not present

## 2021-02-16 NOTE — Progress Notes (Signed)
Subjective:  Patient ID: Rita Payne, female    DOB: 1945-01-09  Age: 76 y.o. MRN: [id number]  CC: No chief complaint on file.   HPI Rita Payne presents for hospital follow up. Had diarrhea for 6 days until she got the fluids at the E.D. Still weak. HAs to sit down and rest frequently. Was not admitted. Went home from E.D. after being given 2 bags of fluid, antibiotics and magnesium. Has had no BM for 6 days.. Glucose and been 60-80 due to profuse diarrhea. Everything she tried to eat went straight through her. Back on regular diet last two days without any diarrhea.  Depression screen Rita Payne  Decreased Interest 0 0 0  Down, Depressed, Hopeless 0 0 0  PHQ - 2 Score 0 0 0  Some recent data might be hidden    History Rita Payne has a past medical history of Allergy, Arthritis, Basal cell carcinoma, Cataract, Diarrhea, First degree heart block, GERD (gastroesophageal reflux disease), History of kidney stones, Hyperlipidemia, Hypertension, Leg fracture, Melanoma ([place]), Mild intermittent asthma, SUI (stress urinary incontinence, female), and Type 2 diabetes mellitus ([place]).   She has a past surgical history that includes Tonsillectomy; Abdominal hysterectomy (1987); Cholecystectomy (1989); Knee arthroscopy (Bilateral); Hernia repair; kidney tumor removal  (1986); Blepharoptosis repair (Bilateral, 2009); Total hip arthroplasty (Right, 08/25/2012); Lumbar laminectomy/decompression microdiscectomy (N/A, 12/08/2013); Pubovaginal sling (N/A, 10/03/2014); and Replacement total hip w/  resurfacing implants (Right).   Her family history includes Arrhythmia in her brother; Arthritis/Rheumatoid in her brother and sister; Colon polyps in her sister and sister; Diabetes in her mother, sister, and sister; Heart attack in her brother and mother; Heart disease in her brother, father, mother, and sister; Heart failure in her brother and sister; Stroke in her father, mother, and sister;  Vision loss in her brother.She reports that she has been smoking cigarettes. She has a 8.25 pack-year smoking history. She has never used smokeless tobacco. She reports that she does not drink alcohol and does not use drugs.    ROS Review of Systems  Constitutional: Negative.   HENT: Negative.    Eyes:  Negative for visual disturbance.  Respiratory:  Negative for shortness of breath.   Cardiovascular:  Negative for chest pain.  Gastrointestinal:  Negative for abdominal pain.  Musculoskeletal:  Negative for arthralgias.   Objective:  BP (!) 96/57    Pulse 77    Temp (!) 97.5 F (36.4 C)    Ht 5\' 4"  (1.626 m)    Wt 174 lb 12.8 oz (79.3 kg)    LMP 04/14/1982    SpO2 97%    BMI 30.00 kg/m   BP Readings from Last 3 Encounters:  02/16/21 (!) 96/57  02/09/21 (!) 121/54  02/09/21 (!) 93/56    Wt Readings from Last 3 Encounters:  02/16/21 174 lb 12.8 oz (79.3 kg)  02/09/21 173 lb (78.5 kg)  02/09/21 173 lb (78.5 kg)     Physical Exam Constitutional:      General: She is not in acute distress.    Appearance: She is well-developed.  HENT:     Head: Normocephalic and atraumatic.  Eyes:     Conjunctiva/sclera: Conjunctivae normal.     Pupils: Pupils are equal, round, and reactive to light.  Neck:     Thyroid: No thyromegaly.  Cardiovascular:     Rate and Rhythm: Normal rate and regular rhythm.     Heart sounds: Normal heart sounds. No murmur heard. Pulmonary:  Effort: Pulmonary effort is normal. No respiratory distress.     Breath sounds: Normal breath sounds. No wheezing or rales.  Abdominal:     General: Bowel sounds are normal. There is no distension.     Palpations: Abdomen is soft.     Tenderness: There is no abdominal tenderness.  Musculoskeletal:        General: Normal range of motion.     Cervical back: Normal range of motion and neck supple.  Lymphadenopathy:     Cervical: No cervical adenopathy.  Skin:    General: Skin is warm and dry.  Neurological:      Mental Status: She is alert and oriented to person, place, and time.  Psychiatric:        Behavior: Behavior normal.        Thought Content: Thought content normal.        Judgment: Judgment normal.      Assessment & Plan:   Diagnoses and all orders for this visit:  Diarrhea, unspecified type  Hypoglycemia       I am having Rita Payne "Rita Payne" maintain her cholecalciferol, aspirin, diclofenac Sodium, Fluticasone-Salmeterol, telmisartan-hydrochlorothiazide, omeprazole, atorvastatin, celecoxib, cetirizine, cloNIDine, empagliflozin, glimepiride, fenofibrate, Estradiol, Trulicity, docusate sodium, FREESTYLE LITE, albuterol, azithromycin, and cephALEXin.  Allergies as of 02/16/2021       Reactions   Ivp Dye [iodinated Contrast Media] Anaphylaxis   Shellfish Allergy Anaphylaxis   Vasotec [enalapril] Shortness Of Breath   Adhesive [tape] Other (See Comments)   blisters   Amlodipine    swelling   Gabapentin Other (See Comments)   drowsiness   Iodine Other (See Comments)   Iodine that is applied to skin causes blisters    Pyridium [phenazopyridine Hcl]    blisters        Medication List        Accurate as of February 16, 2021 10:13 AM. If you have any questions, ask your nurse or doctor.          albuterol 108 (90 Base) MCG/ACT inhaler Commonly known as: VENTOLIN HFA USE 2 INHALATIONS EVERY 6 HOURS AS NEEDED FOR WHEEZING OR SHORTNESS OF BREATH, NEED FOR CHRONIC ASTHMA. What changed: See the new instructions.   aspirin 81 MG tablet Take 1 tablet (81 mg total) by mouth daily. Resume 4 days post-op   atorvastatin 80 MG tablet Commonly known as: LIPITOR Take 1 tablet (80 mg total) by mouth daily.   azithromycin 250 MG tablet Commonly known as: ZITHROMAX Take 1 tablet (250 mg total) by mouth daily. Take first 2 tablets together, then 1 every day until finished.   celecoxib 200 MG capsule Commonly known as: CELEBREX TAKE 1 CAPSULE EVERY 12 HOURS   cephALEXin  500 MG capsule Commonly known as: KEFLEX Take 1 capsule (500 mg total) by mouth 4 (four) times daily.   cetirizine 10 MG tablet Commonly known as: ZYRTEC Take 1 tablet (10 mg total) by mouth daily.   cholecalciferol 1000 units tablet Commonly known as: VITAMIN D Take 1,000 Units by mouth daily.   cloNIDine 0.1 MG tablet Commonly known as: CATAPRES Take 1 tablet (0.1 mg total) by mouth 2 (two) times daily.   diclofenac Sodium 1 % Gel Commonly known as: VOLTAREN Apply 2 g topically 4 (four) times daily.   docusate sodium 100 MG capsule Commonly known as: Colace Take 1 capsule (100 mg total) by mouth 2 (two) times daily. To soften stool   empagliflozin 25 MG Tabs tablet Commonly known as: Jardiance Take  1 tablet (25 mg total) by mouth daily before breakfast.   Estradiol 10 MCG Tabs vaginal tablet Commonly known as: Vagifem Place 1 tablet (10 mcg total) vaginally 2 (two) times a week.   fenofibrate 48 MG tablet Commonly known as: TRICOR Take 1 tablet (48 mg total) by mouth daily.   Fluticasone-Salmeterol 250-50 MCG/DOSE Aepb Commonly known as: Advair Diskus USE 1 INHALATION EVERY 12 HOURS   FREESTYLE LITE test strip Generic drug: glucose blood USE TO TEST DAILY   glimepiride 4 MG tablet Commonly known as: AMARYL Take 1 tablet (4 mg total) by mouth daily with breakfast.   omeprazole 40 MG capsule Commonly known as: PRILOSEC Take 1 capsule (40 mg total) by mouth daily.   telmisartan-hydrochlorothiazide 80-25 MG tablet Commonly known as: Micardis HCT Take 1 tablet by mouth every morning.   Trulicity 0.98 JX/9.1YN Sopn Generic drug: Dulaglutide INJECT 0.75 MG (0.5 ML) UNDER THE SKIN ONCE A WEEK       Hold the glimepiride for the next 3 days.   Follow-up: No follow-ups on file.  Claretta Fraise, M.D.

## 2021-03-09 DIAGNOSIS — Z20822 Contact with and (suspected) exposure to covid-19: Secondary | ICD-10-CM | POA: Diagnosis not present

## 2021-03-13 ENCOUNTER — Other Ambulatory Visit: Payer: Self-pay | Admitting: Family Medicine

## 2021-03-13 DIAGNOSIS — E119 Type 2 diabetes mellitus without complications: Secondary | ICD-10-CM

## 2021-03-13 DIAGNOSIS — I10 Essential (primary) hypertension: Secondary | ICD-10-CM

## 2021-03-21 ENCOUNTER — Encounter: Payer: Self-pay | Admitting: Family Medicine

## 2021-03-21 ENCOUNTER — Ambulatory Visit (INDEPENDENT_AMBULATORY_CARE_PROVIDER_SITE_OTHER): Payer: Medicare Other | Admitting: Family Medicine

## 2021-03-21 VITALS — BP 123/72 | HR 81 | Temp 97.7°F | Ht 64.0 in

## 2021-03-21 DIAGNOSIS — N3090 Cystitis, unspecified without hematuria: Secondary | ICD-10-CM

## 2021-03-21 DIAGNOSIS — I10 Essential (primary) hypertension: Secondary | ICD-10-CM

## 2021-03-21 DIAGNOSIS — E785 Hyperlipidemia, unspecified: Secondary | ICD-10-CM | POA: Diagnosis not present

## 2021-03-21 DIAGNOSIS — E559 Vitamin D deficiency, unspecified: Secondary | ICD-10-CM | POA: Diagnosis not present

## 2021-03-21 DIAGNOSIS — E1141 Type 2 diabetes mellitus with diabetic mononeuropathy: Secondary | ICD-10-CM

## 2021-03-21 DIAGNOSIS — E1169 Type 2 diabetes mellitus with other specified complication: Secondary | ICD-10-CM | POA: Diagnosis not present

## 2021-03-21 LAB — URINALYSIS, COMPLETE
Bilirubin, UA: NEGATIVE
Ketones, UA: NEGATIVE
Nitrite, UA: NEGATIVE
Protein,UA: NEGATIVE
RBC, UA: NEGATIVE
Specific Gravity, UA: 1.015 (ref 1.005–1.030)
Urobilinogen, Ur: 0.2 mg/dL (ref 0.2–1.0)
pH, UA: 5 (ref 5.0–7.5)

## 2021-03-21 LAB — BAYER DCA HB A1C WAIVED: HB A1C (BAYER DCA - WAIVED): 6 % — ABNORMAL HIGH (ref 4.8–5.6)

## 2021-03-21 LAB — MICROSCOPIC EXAMINATION: RBC, Urine: NONE SEEN /hpf (ref 0–2)

## 2021-03-21 MED ORDER — SULFAMETHOXAZOLE-TRIMETHOPRIM 800-160 MG PO TABS: 1.0000 | ORAL_TABLET | Freq: Two times a day (BID) | ORAL | 0 refills | Status: DC

## 2021-03-21 NOTE — Progress Notes (Signed)
? ?Subjective:  ?Patient ID: Rita Payne,  ?female    DOB: 1945/03/22  Age: 76 y.o.  ? ? ?CC: Medical Management of Chronic Issues ? ? ?HPI ?Rita Payne presents for  follow-up of hypertension. Patient has no history of headache chest pain or shortness of breath or recent cough. Patient also denies symptoms of TIA such as numbness weakness lateralizing. Patient denies side effects from medication. States taking it regularly. ? ?Patient also  in for follow-up of elevated cholesterol. Doing well without complaints on current medication. Denies side effects  including myalgia and arthralgia and nausea. Also in today for liver function testing. Currently no chest pain, shortness of breath or other cardiovascular related symptoms noted. ? ?Follow-up of diabetes. Patient does check blood sugar at home. Readings run between 115-140 fasting and not checking prandial.  ?Patient denies symptoms such as excessive hunger or urinary frequency, excessive hunger, nausea ?No significant hypoglycemic spells noted. ?Medications reviewed. Pt reports taking them regularly. Pt. denies complication/adverse reaction today.  ? ? ?History ?Rita Payne has a past medical history of Allergy, Arthritis, Basal cell carcinoma, Cataract, Diarrhea, First degree heart block, GERD (gastroesophageal reflux disease), History of kidney stones, Hyperlipidemia, Hypertension, Leg fracture, Melanoma ([place]), Mild intermittent asthma, SUI (stress urinary incontinence, female), and Type 2 diabetes mellitus ([place]).  ? ?She has a past surgical history that includes Tonsillectomy; Abdominal hysterectomy (1987); Cholecystectomy (1989); Knee arthroscopy (Bilateral); Hernia repair; kidney tumor removal  (1986); Blepharoptosis repair (Bilateral, 2009); Total hip arthroplasty (Right, 08/25/2012); Lumbar laminectomy/decompression microdiscectomy (N/A, 12/08/2013); Pubovaginal sling (N/A, 10/03/2014); and Replacement total hip w/  resurfacing implants (Right).  ? ?Her family  history includes Arrhythmia in her brother; Arthritis/Rheumatoid in her brother and sister; Colon polyps in her sister and sister; Diabetes in her mother, sister, and sister; Heart attack in her brother and mother; Heart disease in her brother, father, mother, and sister; Heart failure in her brother and sister; Stroke in her father, mother, and sister; Vision loss in her brother.She reports that she has been smoking cigarettes. She has a 8.25 pack-year smoking history. She has never used smokeless tobacco. She reports that she does not drink alcohol and does not use drugs. ? ?Current Outpatient Medications on File Prior to Visit  ?Medication Sig Dispense Refill  ? albuterol (VENTOLIN HFA) 108 (90 Base) MCG/ACT inhaler USE 2 INHALATIONS EVERY 6 HOURS AS NEEDED FOR WHEEZING OR SHORTNESS OF BREATH, NEED FOR CHRONIC ASTHMA. (Patient taking differently: Inhale 2 puffs into the lungs every 6 (six) hours as needed for shortness of breath.) 25.5 g 3  ? aspirin 81 MG tablet Take 1 tablet (81 mg total) by mouth daily. Resume 4 days post-op 30 tablet   ? atorvastatin (LIPITOR) 80 MG tablet Take 1 tablet (80 mg total) by mouth daily. 90 tablet 3  ? celecoxib (CELEBREX) 200 MG capsule TAKE 1 CAPSULE EVERY 12 HOURS 180 capsule 3  ? cetirizine (ZYRTEC) 10 MG tablet Take 1 tablet (10 mg total) by mouth daily. 90 tablet 3  ? cholecalciferol (VITAMIN D) 1000 UNITS tablet Take 1,000 Units by mouth daily.    ? cloNIDine (CATAPRES) 0.1 MG tablet Take 1 tablet (0.1 mg total) by mouth 2 (two) times daily. 180 tablet 3  ? diclofenac Sodium (VOLTAREN) 1 % GEL Apply 2 g topically 4 (four) times daily. 100 g 2  ? docusate sodium (COLACE) 100 MG capsule Take 1 capsule (100 mg total) by mouth 2 (two) times daily. To soften stool 180 capsule 3  ? Dulaglutide (  TRULICITY) 8.52 DP/8.2UM SOPN INJECT 0.75 MG (0.5 ML) UNDER THE SKIN ONCE WEEKLY 6 mL 0  ? empagliflozin (JARDIANCE) 25 MG TABS tablet Take 1 tablet (25 mg total) by mouth daily before  breakfast. 90 tablet 3  ? Estradiol (VAGIFEM) 10 MCG TABS vaginal tablet Place 1 tablet (10 mcg total) vaginally 2 (two) times a week. 24 tablet 3  ? fenofibrate (TRICOR) 48 MG tablet Take 1 tablet (48 mg total) by mouth daily. 90 tablet 3  ? Fluticasone-Salmeterol (ADVAIR DISKUS) 250-50 MCG/DOSE AEPB USE 1 INHALATION EVERY 12 HOURS 180 each 3  ? glimepiride (AMARYL) 4 MG tablet Take 1 tablet (4 mg total) by mouth daily with breakfast. 90 tablet 3  ? glucose blood (FREESTYLE LITE) test strip USE TO TEST DAILY 100 strip 11  ? MICARDIS HCT 80-25 MG tablet TAKE 1 TABLET EVERY MORNING 90 tablet 0  ? omeprazole (PRILOSEC) 40 MG capsule Take 1 capsule (40 mg total) by mouth daily. 90 capsule 3  ? ?Current Facility-Administered Medications on File Prior to Visit  ?Medication Dose Route Frequency Provider Last Rate Last Admin  ? magnesium citrate solution 1 Bottle  1 Bottle Oral Once Carolan Clines, MD      ? sodium phosphate (FLEET) 7-19 GM/118ML enema 1 enema  1 enema Rectal Once Carolan Clines, MD      ? ? ?ROS ?Review of Systems  ?Constitutional: Negative.   ?HENT: Negative.    ?Eyes:  Negative for visual disturbance.  ?Respiratory:  Negative for shortness of breath.   ?Cardiovascular:  Negative for chest pain.  ?Gastrointestinal:  Negative for abdominal pain.  ?Genitourinary:  Positive for frequency (onset yesterday afternoon).  ?Musculoskeletal:  Positive for back pain (left lumbar aching. Onset yesterday). Negative for arthralgias.  ? ?Objective:  ?BP 123/72   Pulse 81   Temp 97.7 ?F (36.5 ?C)   Ht $R'5\' 4"'qW$  (1.626 m)   LMP 04/14/1982   SpO2 93%   BMI 30.00 kg/m?  ? ?BP Readings from Last 3 Encounters:  ?03/21/21 123/72  ?02/16/21 (!) 96/57  ?02/09/21 (!) 121/54  ? ? ?Wt Readings from Last 3 Encounters:  ?02/16/21 174 lb 12.8 oz (79.3 kg)  ?02/09/21 173 lb (78.5 kg)  ?02/09/21 173 lb (78.5 kg)  ? ? ? ?Physical Exam ?Constitutional:   ?   General: She is not in acute distress. ?   Appearance: She is  well-developed.  ?HENT:  ?   Head: Normocephalic and atraumatic.  ?Eyes:  ?   Conjunctiva/sclera: Conjunctivae normal.  ?   Pupils: Pupils are equal, round, and reactive to light.  ?Cardiovascular:  ?   Rate and Rhythm: Normal rate and regular rhythm.  ?   Heart sounds: Normal heart sounds. No murmur heard. ?Pulmonary:  ?   Effort: Pulmonary effort is normal. No respiratory distress.  ?   Breath sounds: Normal breath sounds. No wheezing or rales.  ?Abdominal:  ?   General: Bowel sounds are normal. There is no distension.  ?   Palpations: Abdomen is soft.  ?   Tenderness: There is no abdominal tenderness.  ?Musculoskeletal:     ?   General: Tenderness (left flank) present.  ?   Cervical back: Normal range of motion and neck supple.  ?   Lumbar back: Spasms and tenderness present. No deformity. Decreased range of motion.  ?Skin: ?   General: Skin is warm and dry.  ?Neurological:  ?   Mental Status: She is alert and oriented to person, place,  and time.  ?   Deep Tendon Reflexes: Reflexes are normal and symmetric.  ?Psychiatric:     ?   Behavior: Behavior normal.     ?   Thought Content: Thought content normal.  ? ? ?Diabetic Foot Exam - Simple   ?No data filed ?  ? ? ? ? ?Assessment & Plan:  ? ?Rita Payne was seen today for medical management of chronic issues. ? ?Diagnoses and all orders for this visit: ? ?Type 2 diabetes mellitus with diabetic mononeuropathy, without long-term current use of insulin ([place]) ?-     Bayer DCA Hb A1c Waived ? ?Essential hypertension ?-     CBC with Differential/Platelet ?-     CMP14+EGFR ? ?Hyperlipidemia associated with type 2 diabetes mellitus ([place]) ?-     Lipid panel ? ?Vitamin D deficiency ?-     VITAMIN D 25 Hydroxy (Vit-D Deficiency, Fractures) ? ?Cystitis ?-     Urinalysis, Complete ?-     Urine Culture ? ?Other orders ?-     sulfamethoxazole-trimethoprim (BACTRIM DS) 800-160 MG tablet; Take 1 tablet by mouth 2 (two) times daily. ? ? ?I have discontinued Hortence Armbrust "Sandy"'s  azithromycin and cephALEXin. I am also having her start on sulfamethoxazole-trimethoprim. Additionally, I am having her maintain her cholecalciferol, aspirin, diclofenac Sodium, Fluticasone-Salmeterol, omeprazole, atorvastatin,

## 2021-03-22 LAB — CBC WITH DIFFERENTIAL/PLATELET
Basophils Absolute: 0.1 10*3/uL (ref 0.0–0.2)
Basos: 1 %
EOS (ABSOLUTE): 0.2 10*3/uL (ref 0.0–0.4)
Eos: 2 %
Hematocrit: 44.7 % (ref 34.0–46.6)
Hemoglobin: 15.6 g/dL (ref 11.1–15.9)
Immature Grans (Abs): 0 10*3/uL (ref 0.0–0.1)
Immature Granulocytes: 0 %
Lymphocytes Absolute: 2.6 10*3/uL (ref 0.7–3.1)
Lymphs: 25 %
MCH: 31.9 pg (ref 26.6–33.0)
MCHC: 34.9 g/dL (ref 31.5–35.7)
MCV: 91 fL (ref 79–97)
Monocytes Absolute: 0.9 10*3/uL (ref 0.1–0.9)
Monocytes: 9 %
Neutrophils Absolute: 6.5 10*3/uL (ref 1.4–7.0)
Neutrophils: 63 %
Platelets: 149 10*3/uL — ABNORMAL LOW (ref 150–450)
RBC: 4.89 x10E6/uL (ref 3.77–5.28)
RDW: 14.5 % (ref 11.7–15.4)
WBC: 10.3 10*3/uL (ref 3.4–10.8)

## 2021-03-22 LAB — CMP14+EGFR
ALT: 21 IU/L (ref 0–32)
AST: 19 IU/L (ref 0–40)
Albumin/Globulin Ratio: 1.7 (ref 1.2–2.2)
Albumin: 4.3 g/dL (ref 3.7–4.7)
Alkaline Phosphatase: 75 IU/L (ref 44–121)
BUN/Creatinine Ratio: 25 (ref 12–28)
BUN: 19 mg/dL (ref 8–27)
Bilirubin Total: 0.5 mg/dL (ref 0.0–1.2)
CO2: 24 mmol/L (ref 20–29)
Calcium: 10 mg/dL (ref 8.7–10.3)
Chloride: 100 mmol/L (ref 96–106)
Creatinine, Ser: 0.76 mg/dL (ref 0.57–1.00)
Globulin, Total: 2.5 g/dL (ref 1.5–4.5)
Glucose: 127 mg/dL — ABNORMAL HIGH (ref 70–99)
Potassium: 3.9 mmol/L (ref 3.5–5.2)
Sodium: 141 mmol/L (ref 134–144)
Total Protein: 6.8 g/dL (ref 6.0–8.5)
eGFR: 83 mL/min/{1.73_m2} (ref >59)

## 2021-03-22 LAB — LIPID PANEL
Chol/HDL Ratio: 4.3 ratio (ref 0.0–4.4)
Cholesterol, Total: 145 mg/dL (ref 100–199)
HDL: 34 mg/dL — ABNORMAL LOW (ref >39)
LDL Chol Calc (NIH): 55 mg/dL (ref 0–99)
Triglycerides: 364 mg/dL — ABNORMAL HIGH (ref 0–149)
VLDL Cholesterol Cal: 56 mg/dL — ABNORMAL HIGH (ref 5–40)

## 2021-03-22 LAB — URINE CULTURE

## 2021-03-22 LAB — VITAMIN D 25 HYDROXY (VIT D DEFICIENCY, FRACTURES): Vit D, 25-Hydroxy: 39.6 ng/mL (ref 30.0–100.0)

## 2021-03-24 NOTE — Progress Notes (Signed)
Hello Seniya, ? ?Your lab result is normal and/or stable.Some minor variations that are not significant are commonly marked abnormal, but do not represent any medical problem for you. ? ?Best regards, ?Claretta Fraise, M.D.

## 2021-03-27 NOTE — Progress Notes (Signed)
Patient calling back.   °

## 2021-04-05 DIAGNOSIS — E119 Type 2 diabetes mellitus without complications: Secondary | ICD-10-CM | POA: Diagnosis not present

## 2021-04-05 LAB — HM DIABETES EYE EXAM

## 2021-04-21 ENCOUNTER — Other Ambulatory Visit: Payer: Self-pay | Admitting: Family Medicine

## 2021-04-21 DIAGNOSIS — K219 Gastro-esophageal reflux disease without esophagitis: Secondary | ICD-10-CM

## 2021-05-02 DIAGNOSIS — M7061 Trochanteric bursitis, right hip: Secondary | ICD-10-CM | POA: Diagnosis not present

## 2021-05-02 DIAGNOSIS — Z96641 Presence of right artificial hip joint: Secondary | ICD-10-CM | POA: Diagnosis not present

## 2021-05-02 DIAGNOSIS — M25551 Pain in right hip: Secondary | ICD-10-CM | POA: Diagnosis not present

## 2021-05-05 DIAGNOSIS — Z20822 Contact with and (suspected) exposure to covid-19: Secondary | ICD-10-CM | POA: Diagnosis not present

## 2021-05-15 DIAGNOSIS — Z20822 Contact with and (suspected) exposure to covid-19: Secondary | ICD-10-CM | POA: Diagnosis not present

## 2021-05-25 ENCOUNTER — Other Ambulatory Visit: Payer: Self-pay | Admitting: Family Medicine

## 2021-06-05 ENCOUNTER — Other Ambulatory Visit: Payer: Self-pay | Admitting: Family Medicine

## 2021-06-05 DIAGNOSIS — E119 Type 2 diabetes mellitus without complications: Secondary | ICD-10-CM

## 2021-06-11 ENCOUNTER — Other Ambulatory Visit: Payer: Self-pay | Admitting: Family Medicine

## 2021-06-11 DIAGNOSIS — I10 Essential (primary) hypertension: Secondary | ICD-10-CM

## 2021-06-18 ENCOUNTER — Encounter: Payer: Self-pay | Admitting: Gastroenterology

## 2021-06-21 ENCOUNTER — Ambulatory Visit (INDEPENDENT_AMBULATORY_CARE_PROVIDER_SITE_OTHER): Payer: Medicare Other | Admitting: Family Medicine

## 2021-06-21 ENCOUNTER — Encounter: Payer: Self-pay | Admitting: Family Medicine

## 2021-06-21 VITALS — BP 113/62 | HR 91 | Temp 97.9°F | Ht 64.0 in | Wt 178.0 lb

## 2021-06-21 DIAGNOSIS — E785 Hyperlipidemia, unspecified: Secondary | ICD-10-CM | POA: Diagnosis not present

## 2021-06-21 DIAGNOSIS — E1141 Type 2 diabetes mellitus with diabetic mononeuropathy: Secondary | ICD-10-CM

## 2021-06-21 DIAGNOSIS — R252 Cramp and spasm: Secondary | ICD-10-CM

## 2021-06-21 DIAGNOSIS — M546 Pain in thoracic spine: Secondary | ICD-10-CM | POA: Diagnosis not present

## 2021-06-21 DIAGNOSIS — I10 Essential (primary) hypertension: Secondary | ICD-10-CM | POA: Diagnosis not present

## 2021-06-21 DIAGNOSIS — E1169 Type 2 diabetes mellitus with other specified complication: Secondary | ICD-10-CM

## 2021-06-21 LAB — BAYER DCA HB A1C WAIVED: HB A1C (BAYER DCA - WAIVED): 6.2 % — ABNORMAL HIGH (ref 4.8–5.6)

## 2021-06-21 MED ORDER — DICLOFENAC SODIUM 1 % EX GEL: 2.0000 g | Freq: Four times a day (QID) | CUTANEOUS | 2 refills | Status: DC

## 2021-06-21 NOTE — Progress Notes (Signed)
Subjective:  Patient ID: Rita Payne,  female    DOB: 1945/10/07  Age: 76 y.o.    CC: Medical Management of Chronic Issues   HPI Kaelee Pfeffer presents for  follow-up of hypertension. Patient has no history of headache chest pain or shortness of breath or recent cough. Patient also denies symptoms of TIA such as numbness weakness lateralizing. Patient denies side effects from medication. States taking it regularly.  Patient also  in for follow-up of elevated cholesterol. Doing well without complaints on current medication. Denies side effects  including myalgia and arthralgia and nausea. Also in today for liver function testing. Currently no chest pain, shortness of breath or other cardiovascular related symptoms noted.  Follow-up of diabetes. Patient does check blood sugar at home. Readings run between 100 and 130 Patient denies symptoms such as excessive hunger or urinary frequency, excessive hunger, nausea No significant hypoglycemic spells noted. Medications reviewed. Pt reports taking them regularly. Pt. denies complication/adverse reaction today.    History Lailani has a past medical history of Allergy, Arthritis, Basal cell carcinoma, Cataract, Diarrhea, First degree heart block, GERD (gastroesophageal reflux disease), History of kidney stones, Hyperlipidemia, Hypertension, Leg fracture, Melanoma (HCC), Mild intermittent asthma, SUI (stress urinary incontinence, female), and Type 2 diabetes mellitus (HCC).   She has a past surgical history that includes Tonsillectomy; Abdominal hysterectomy (1987); Cholecystectomy (1989); Knee arthroscopy (Bilateral); Hernia repair; kidney tumor removal  (1986); Blepharoptosis repair (Bilateral, 2009); Total hip arthroplasty (Right, 08/25/2012); Lumbar laminectomy/decompression microdiscectomy (N/A, 12/08/2013); Pubovaginal sling (N/A, 10/03/2014); and Replacement total hip w/  resurfacing implants (Right).   Her family history includes Arrhythmia in her  brother; Arthritis/Rheumatoid in her brother and sister; Colon polyps in her sister and sister; Diabetes in her mother, sister, and sister; Heart attack in her brother and mother; Heart disease in her brother, father, mother, and sister; Heart failure in her brother and sister; Stroke in her father, mother, and sister; Vision loss in her brother.She reports that she has been smoking cigarettes. She has a 8.25 pack-year smoking history. She has never used smokeless tobacco. She reports that she does not drink alcohol and does not use drugs.  Current Outpatient Medications on File Prior to Visit  Medication Sig Dispense Refill   albuterol (VENTOLIN HFA) 108 (90 Base) MCG/ACT inhaler USE 2 INHALATIONS EVERY 6 HOURS AS NEEDED FOR WHEEZING OR SHORTNESS OF BREATH, NEED FOR CHRONIC ASTHMA. (Patient taking differently: Inhale 2 puffs into the lungs every 6 (six) hours as needed for shortness of breath.) 25.5 g 3   aspirin 81 MG tablet Take 1 tablet (81 mg total) by mouth daily. Resume 4 days post-op 30 tablet    atorvastatin (LIPITOR) 80 MG tablet TAKE 1 TABLET DAILY 90 tablet 0   celecoxib (CELEBREX) 200 MG capsule TAKE 1 CAPSULE EVERY 12 HOURS 180 capsule 3   cetirizine (ZYRTEC) 10 MG tablet Take 1 tablet (10 mg total) by mouth daily. 90 tablet 3   cholecalciferol (VITAMIN D) 1000 UNITS tablet Take 1,000 Units by mouth daily.     cloNIDine (CATAPRES) 0.1 MG tablet Take 1 tablet (0.1 mg total) by mouth 2 (two) times daily. 180 tablet 3   docusate sodium (COLACE) 100 MG capsule Take 1 capsule (100 mg total) by mouth 2 (two) times daily. To soften stool 180 capsule 3   empagliflozin (JARDIANCE) 25 MG TABS tablet Take 1 tablet (25 mg total) by mouth daily before breakfast. 90 tablet 3   Estradiol (VAGIFEM) 10 MCG TABS vaginal tablet  Place 1 tablet (10 mcg total) vaginally 2 (two) times a week. 24 tablet 3   fenofibrate (TRICOR) 48 MG tablet Take 1 tablet (48 mg total) by mouth daily. 90 tablet 3    Fluticasone-Salmeterol (ADVAIR DISKUS) 250-50 MCG/DOSE AEPB USE 1 INHALATION EVERY 12 HOURS 180 each 3   glimepiride (AMARYL) 4 MG tablet Take 1 tablet (4 mg total) by mouth daily with breakfast. 90 tablet 3   glucose blood (FREESTYLE LITE) test strip USE TO TEST DAILY 100 strip 11   MICARDIS HCT 80-25 MG tablet TAKE 1 TABLET EVERY MORNING 90 tablet 0   omeprazole (PRILOSEC) 40 MG capsule TAKE 1 CAPSULE DAILY 90 capsule 3   TRULICITY 0.53 ZJ/6.7HA SOPN INJECT 0.75 MG (0.5 ML) UNDER THE SKIN ONCE WEEKLY 6 mL 0   Current Facility-Administered Medications on File Prior to Visit  Medication Dose Route Frequency Provider Last Rate Last Admin   magnesium citrate solution 1 Bottle  1 Bottle Oral Once Carolan Clines, MD       sodium phosphate (FLEET) 7-19 GM/118ML enema 1 enema  1 enema Rectal Once Carolan Clines, MD        ROS Review of Systems  Constitutional: Negative.   HENT: Negative.    Eyes:  Negative for visual disturbance.  Respiratory:  Negative for shortness of breath.   Cardiovascular:  Negative for chest pain.  Gastrointestinal:  Negative for abdominal pain.  Musculoskeletal:  Negative for arthralgias.  Neurological:  Positive for numbness (toes go to sleep some times, not truly numb. Occurs late in the day after being on er feet all day.).    Objective:  BP 113/62   Pulse 91   Temp 97.9 F (36.6 C)   Ht $R'5\' 4"'gF$  (1.626 m)   Wt 178 lb (80.7 kg)   LMP 04/14/1982   SpO2 94%   BMI 30.55 kg/m   BP Readings from Last 3 Encounters:  06/21/21 113/62  03/21/21 123/72  02/16/21 (!) 96/57    Wt Readings from Last 3 Encounters:  06/21/21 178 lb (80.7 kg)  02/16/21 174 lb 12.8 oz (79.3 kg)  02/09/21 173 lb (78.5 kg)     Physical Exam Constitutional:      General: She is not in acute distress.    Appearance: She is well-developed.  HENT:     Head: Normocephalic and atraumatic.  Eyes:     Conjunctiva/sclera: Conjunctivae normal.     Pupils: Pupils are equal,  round, and reactive to light.  Neck:     Thyroid: No thyromegaly.  Cardiovascular:     Rate and Rhythm: Normal rate and regular rhythm.     Heart sounds: Normal heart sounds. No murmur heard. Pulmonary:     Effort: Pulmonary effort is normal. No respiratory distress.     Breath sounds: Normal breath sounds. No wheezing or rales.  Abdominal:     General: Bowel sounds are normal. There is no distension.     Palpations: Abdomen is soft.     Tenderness: There is no abdominal tenderness.  Musculoskeletal:        General: Normal range of motion.     Cervical back: Normal range of motion and neck supple.  Lymphadenopathy:     Cervical: No cervical adenopathy.  Skin:    General: Skin is warm and dry.  Neurological:     Mental Status: She is alert and oriented to person, place, and time.  Psychiatric:        Behavior: Behavior normal.  Thought Content: Thought content normal.        Judgment: Judgment normal.     Diabetic Foot Exam - Simple   Simple Foot Form Diabetic Foot exam was performed with the following findings: Yes 06/21/2021  8:59 AM  Visual Inspection No deformities, no ulcerations, no other skin breakdown bilaterally: Yes Sensation Testing Intact to touch and monofilament testing bilaterally: Yes Pulse Check Posterior Tibialis and Dorsalis pulse intact bilaterally: Yes Comments     Lab Results  Component Value Date   HGBA1C 6.0 (H) 03/21/2021   HGBA1C 6.0 (H) 12/19/2020   HGBA1C 5.9 (H) 09/21/2020    Assessment & Plan:   Lyndsie was seen today for medical management of chronic issues.  Diagnoses and all orders for this visit:  Type 2 diabetes mellitus with diabetic mononeuropathy, without long-term current use of insulin (HCC) -     Bayer DCA Hb A1c Waived  Essential hypertension -     CBC with Differential/Platelet -     CMP14+EGFR  Hyperlipidemia associated with type 2 diabetes mellitus (HCC) -     Lipid panel  Acute right-sided thoracic back  pain -     diclofenac Sodium (VOLTAREN) 1 % GEL; Apply 2 g topically 4 (four) times daily.  Foot cramps -     Magnesium -     Phosphorus   I have discontinued Teyona Mcmanus "Sandy"'s sulfamethoxazole-trimethoprim. I am also having her maintain her cholecalciferol, aspirin, Fluticasone-Salmeterol, celecoxib, cetirizine, cloNIDine, empagliflozin, glimepiride, fenofibrate, Estradiol, docusate sodium, FREESTYLE LITE, albuterol, omeprazole, atorvastatin, Trulicity, Micardis HCT, and diclofenac Sodium.  Meds ordered this encounter  Medications   diclofenac Sodium (VOLTAREN) 1 % GEL    Sig: Apply 2 g topically 4 (four) times daily.    Dispense:  100 g    Refill:  2     Follow-up: Return in about 3 months (around 09/21/2021).  Claretta Fraise, M.D.

## 2021-06-22 ENCOUNTER — Ambulatory Visit (INDEPENDENT_AMBULATORY_CARE_PROVIDER_SITE_OTHER): Payer: Medicare Other

## 2021-06-22 VITALS — Wt 178.0 lb

## 2021-06-22 DIAGNOSIS — Z0000 Encounter for general adult medical examination without abnormal findings: Secondary | ICD-10-CM

## 2021-06-22 LAB — CBC WITH DIFFERENTIAL/PLATELET
Basophils Absolute: 0.1 10*3/uL (ref 0.0–0.2)
Basos: 1 %
EOS (ABSOLUTE): 0.2 10*3/uL (ref 0.0–0.4)
Eos: 2 %
Hematocrit: 44.9 % (ref 34.0–46.6)
Hemoglobin: 15.4 g/dL (ref 11.1–15.9)
Immature Grans (Abs): 0.1 10*3/uL (ref 0.0–0.1)
Immature Granulocytes: 1 %
Lymphocytes Absolute: 2.3 10*3/uL (ref 0.7–3.1)
Lymphs: 22 %
MCH: 31.8 pg (ref 26.6–33.0)
MCHC: 34.3 g/dL (ref 31.5–35.7)
MCV: 93 fL (ref 79–97)
Monocytes Absolute: 1 10*3/uL — ABNORMAL HIGH (ref 0.1–0.9)
Monocytes: 10 %
Neutrophils Absolute: 6.8 10*3/uL (ref 1.4–7.0)
Neutrophils: 64 %
Platelets: 162 10*3/uL (ref 150–450)
RBC: 4.84 x10E6/uL (ref 3.77–5.28)
RDW: 14.4 % (ref 11.7–15.4)
WBC: 10.4 10*3/uL (ref 3.4–10.8)

## 2021-06-22 LAB — CMP14+EGFR
ALT: 23 IU/L (ref 0–32)
AST: 23 IU/L (ref 0–40)
Albumin/Globulin Ratio: 1.8 (ref 1.2–2.2)
Albumin: 4.3 g/dL (ref 3.7–4.7)
Alkaline Phosphatase: 69 IU/L (ref 44–121)
BUN/Creatinine Ratio: 28 (ref 12–28)
BUN: 21 mg/dL (ref 8–27)
Bilirubin Total: 0.5 mg/dL (ref 0.0–1.2)
CO2: 22 mmol/L (ref 20–29)
Calcium: 9.6 mg/dL (ref 8.7–10.3)
Chloride: 101 mmol/L (ref 96–106)
Creatinine, Ser: 0.76 mg/dL (ref 0.57–1.00)
Globulin, Total: 2.4 g/dL (ref 1.5–4.5)
Glucose: 109 mg/dL — ABNORMAL HIGH (ref 70–99)
Potassium: 4.1 mmol/L (ref 3.5–5.2)
Sodium: 140 mmol/L (ref 134–144)
Total Protein: 6.7 g/dL (ref 6.0–8.5)
eGFR: 82 mL/min/{1.73_m2} (ref >59)

## 2021-06-22 LAB — LIPID PANEL
Chol/HDL Ratio: 4.3 ratio (ref 0.0–4.4)
Cholesterol, Total: 134 mg/dL (ref 100–199)
HDL: 31 mg/dL — ABNORMAL LOW (ref >39)
LDL Chol Calc (NIH): 58 mg/dL (ref 0–99)
Triglycerides: 288 mg/dL — ABNORMAL HIGH (ref 0–149)
VLDL Cholesterol Cal: 45 mg/dL — ABNORMAL HIGH (ref 5–40)

## 2021-06-22 NOTE — Progress Notes (Signed)
Subjective:   Rita Payne is a 76 y.o. female who presents for Medicare Annual (Subsequent) preventive examination.  Virtual Visit via Telephone Note  I connected with  [name] on 06/22/21 at  2:45 PM EDT by telephone and verified that I am speaking with the correct person using two identifiers.  Location: Patient: Home Provider: [facility] Persons participating in the virtual visit: patient/Nurse Health Advisor   I discussed the limitations, risks, security and privacy concerns of performing an evaluation and management service by telephone and the availability of in person appointments. The patient expressed understanding and agreed to proceed.  Interactive audio and video telecommunications were attempted between this nurse and patient, however failed, due to patient having technical difficulties OR patient did not have access to video capability.  We continued and completed visit with audio only.  Some vital signs may be absent or patient reported.   [name], LPN   Review of Systems     Cardiac Risk Factors include: advanced age (>32mn, >>36women);diabetes mellitus;dyslipidemia;hypertension;obesity (BMI >30kg/m2);sedentary lifestyle;smoking/ tobacco exposure;Other (see comment), Risk factor comments: asthma     Objective:    Today's Vitals   06/22/21 1443 06/22/21 1444  Weight: 178 lb (80.7 kg)   PainSc:  5    Body mass index is 30.55 kg/m.     06/22/2021    2:50 PM 06/21/2020    2:35 PM 06/21/2019    2:54 PM 06/18/2018    3:32 PM 06/11/2017   10:39 AM 06/05/2016    9:03 AM 10/03/2014    9:07 AM  Advanced Directives  Does Patient Have a Medical Advance Directive? Yes No Yes Yes Yes Yes Yes  Type of AParamed[place]of [place] will  [place] will [place] will [place] will Healthcare Power of [place]Living will  Does patient want  to make changes to medical advance directive?   No - Patient declined No - Patient declined  No - Patient declined No - Patient declined  Copy of [place] Chart? No - copy requested  No - copy requested No - copy requested No - copy requested No - copy requested No - copy requested  Would patient like information on creating a medical advance directive?  No - Patient declined         Current Medications (verified) Outpatient Encounter Medications as of 06/22/2021  Medication Sig   albuterol (VENTOLIN HFA) 108 (90 Base) MCG/ACT inhaler USE 2 INHALATIONS EVERY 6 HOURS AS NEEDED FOR WHEEZING OR SHORTNESS OF BREATH, NEED FOR CHRONIC ASTHMA. (Patient taking differently: Inhale 2 puffs into the lungs every 6 (six) hours as needed for shortness of breath.)   aspirin 81 MG tablet Take 1 tablet (81 mg total) by mouth daily. Resume 4 days post-op   atorvastatin (LIPITOR) 80 MG tablet TAKE 1 TABLET DAILY   celecoxib (CELEBREX) 200 MG capsule TAKE 1 CAPSULE EVERY 12 HOURS   cetirizine (ZYRTEC) 10 MG tablet Take 1 tablet (10 mg total) by mouth daily.   cholecalciferol (VITAMIN D) 1000 UNITS tablet Take 1,000 Units by mouth daily.   cloNIDine (CATAPRES) 0.1 MG tablet Take 1 tablet (0.1 mg total) by mouth 2 (two) times daily.   diclofenac Sodium (VOLTAREN) 1 % GEL Apply 2 g topically 4 (four) times daily.   docusate sodium (COLACE) 100 MG capsule Take 1 capsule (100 mg total) by mouth 2 (two) times daily. To  soften stool   empagliflozin (JARDIANCE) 25 MG TABS tablet Take 1 tablet (25 mg total) by mouth daily before breakfast.   Estradiol (VAGIFEM) 10 MCG TABS vaginal tablet Place 1 tablet (10 mcg total) vaginally 2 (two) times a week.   fenofibrate (TRICOR) 48 MG tablet Take 1 tablet (48 mg total) by mouth daily.   Fluticasone-Salmeterol (ADVAIR DISKUS) 250-50 MCG/DOSE AEPB USE 1 INHALATION EVERY 12 HOURS   glimepiride (AMARYL) 4 MG tablet Take 1 tablet (4 mg total) by mouth daily with  breakfast.   glucose blood (FREESTYLE LITE) test strip USE TO TEST DAILY   MICARDIS HCT 80-25 MG tablet TAKE 1 TABLET EVERY MORNING   omeprazole (PRILOSEC) 40 MG capsule TAKE 1 CAPSULE DAILY   TRULICITY 3.22 GU/5.4YH SOPN INJECT 0.75 MG (0.5 ML) UNDER THE SKIN ONCE WEEKLY   Facility-Administered Encounter Medications as of 06/22/2021  Medication   magnesium citrate solution 1 Bottle   sodium phosphate (FLEET) 7-19 GM/118ML enema 1 enema    Allergies (verified) Ivp dye [iodinated contrast media], Shellfish allergy, Vasotec [enalapril], Adhesive [tape], Amlodipine, Gabapentin, Iodine, and Pyridium [phenazopyridine hcl]   History: Past Medical History:  Diagnosis Date   Allergy    Arthritis    Basal cell carcinoma    Cataract    Diarrhea    First degree heart block    GERD (gastroesophageal reflux disease)    History of kidney stones    Hyperlipidemia    Hypertension    Leg fracture    Melanoma (HCC)    Mild intermittent asthma    SUI (stress urinary incontinence, female)    Type 2 diabetes mellitus ([place])    Past Surgical History:  Procedure Laterality Date   ABDOMINAL HYSTERECTOMY  1987   BELPHAROPTOSIS REPAIR Bilateral 2009   [place]     kidney tumor removal   1986   KNEE ARTHROSCOPY Bilateral    LUMBAR LAMINECTOMY/DECOMPRESSION MICRODISCECTOMY N/A 12/08/2013   Procedure: LUMBAR DECOMPRESSION L4-L5,  L3-L4 ;  Surgeon: Johnn Hai, MD;  Location: [facility];  Service: Orthopedics;  Laterality: N/A;   PUBOVAGINAL SLING N/A 10/03/2014   Procedure: Gaynelle Arabian;  Surgeon: Carolan Clines, MD;  Location: [facility];  Service: Urology;  Laterality: N/A;   REPLACEMENT TOTAL HIP W/  RESURFACING IMPLANTS Right    TONSILLECTOMY     TOTAL HIP ARTHROPLASTY Right 08/25/2012   Procedure: RIGHT TOTAL HIP ARTHROPLASTY ANTERIOR APPROACH;  Surgeon: [name], MD;  Location: [facility];  Service: Orthopedics;  Laterality: Right;    Family History  Problem Relation Age of Onset   Diabetes Mother    Heart disease Mother    Stroke Mother    Heart attack Mother    Heart disease Father    Stroke Father    Diabetes Sister    Heart disease Sister    Heart failure Sister    Diabetes Sister    Stroke Sister    Colon polyps Sister    Arthritis/Rheumatoid Sister    Colon polyps Sister    Vision loss Brother    Arthritis/Rheumatoid Brother    Heart disease Brother    Heart failure Brother    Heart attack Brother    Arrhythmia Brother    Colon cancer Neg Hx    Esophageal cancer Neg Hx    Stomach cancer Neg Hx    Rectal cancer Neg Hx    Breast cancer Neg Hx  Social History   Socioeconomic History   Marital status: Widowed    Spouse name: Not on file   Number of children: 2   Years of education: some college   Highest education level: Some college, no degree  Occupational History   Occupation: Radio broadcast assistant     Comment: 3 days per wk/ [employer]; 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 Son in [place], daughter in [place]. 2 sisters live nearby   Social Determinants of Health   Financial Resource Strain: Low Risk  (06/22/2021)   Overall Financial Resource Strain (CARDIA)    Difficulty of Paying Living Expenses: Not hard at all  Food Insecurity: No Food Insecurity (06/22/2021)   Hunger Vital Sign    Worried About Running Out of Food in the Last Year: Never true    Ran Out of Food in the Last Year: Never true  Transportation Needs: No Transportation Needs (06/22/2021)    PRAPARE - Hydrologist (Medical): No    Lack of Transportation (Non-Medical): No  Physical Activity: Insufficiently Active (06/22/2021)   Exercise Vital Sign    Days of Exercise per Week: 7 days    Minutes of Exercise per Session: 10 min  Stress: No Stress Concern Present (06/22/2021)   [place]    Feeling of Stress : Only a little  Social Connections: Socially Isolated (06/22/2021)   Social Connection and Isolation Panel [NHANES]    Frequency of Communication with Friends and Family: More than three times a week    Frequency of Social Gatherings with Friends and Family: More than three times a week    Attends Religious Services: Never    Marine scientist or Organizations: No    Attends Archivist Meetings: Never    Marital Status: Widowed    Tobacco Counseling Ready to quit: Not Answered Counseling given: Not Answered   Clinical Intake:  Pre-visit preparation completed: Yes  Pain : 0-10 Pain Score: 5  Pain Type: Chronic pain Pain Location: Hand Pain Orientation: Right, Left Pain Descriptors / Indicators: Aching, Discomfort, Cramping Pain Onset: More than a month ago Pain Frequency: Intermittent     BMI - recorded: 30.55 Nutritional Status: BMI > 30  Obese Nutritional Risks: None Diabetes: Yes CBG done?: No Did pt. bring in CBG monitor from home?: No  How often do you need to have someone help you when you read instructions, pamphlets, or other written materials from your doctor or pharmacy?: 1 - Never  Diabetic? Nutrition Risk Assessment:  Has the patient had any N/V/D within the last 2 months?  No  Does the patient have any non-healing wounds?  No  Has the patient had any unintentional weight loss or weight gain?  No   Diabetes:  Is the patient diabetic?  Yes  If diabetic, was a CBG obtained today?  No  Did the patient bring in their  glucometer from  home?  No  How often do you monitor your CBG's? Once daily fasting - 101 this am per patient.   Financial Strains and Diabetes Management:  Are you having any financial strains with the device, your supplies or your medication? No .  Does the patient want to be seen by Chronic Care Management for management of their diabetes?  No  Would the patient like to be referred to a Nutritionist or for Diabetic Management?  No   Diabetic Exams:  Diabetic Eye Exam: Completed 04/05/2021.   Diabetic Foot Exam: Completed 06/21/2021. Pt has been advised about the importance in completing this exam.  Interpreter Needed?: No  Information entered by :: Akita Maxim, LPN   Activities of Daily Living    06/22/2021    2:50 PM  In your present state of health, do you have any difficulty performing the following activities:  Hearing? 0  Vision? 0  Difficulty concentrating or making decisions? 0  Walking or climbing stairs? 0  Dressing or bathing? 0  Doing errands, shopping? 0  Preparing Food and eating ? N  Using the Toilet? N  In the past six months, have you accidently leaked urine? N  Do you have problems with loss of bowel control? N  Managing your Medications? N  Managing your Finances? N  Housekeeping or managing your Housekeeping? N    Patient Care Team: Claretta Fraise, MD as PCP - General (Family Medicine) Sandford Craze, MD as Referring Physician (Dermatology) Paralee Cancel, MD as Consulting Physician (Orthopedic Surgery)  Indicate any recent Medical Services you may have received from other than [facility] providers in the past year (date may be approximate).     Assessment:   This is a routine wellness examination for Tyja.  Hearing/Vision screen Hearing Screening - Comments:: Denies hearing difficulties   Vision Screening - Comments:: Wears rx glasses - up to date with routine eye exams with [facility] in [place]   Dietary issues and exercise activities discussed: Current  Exercise Habits: The patient does not participate in regular exercise at present, Exercise limited by: None identified   Goals Addressed             This Visit's Progress    Exercise 150 minutes per week (moderate activity)   Not on track    Try to exercise for 30 minutes 5 times per week.     Patient Stated       06/22/2021 AWV Goal: Keep All Scheduled Appointments  Over the next year, patient will attend all scheduled appointments with their PCP and any specialists that they see.      Quit Smoking   Not on track    Patient plans to talk with Chevis Pretty regarding smoking cessation aids, set a quit date and work toward quitting smoking.        Depression Screen    06/22/2021    3:04 PM 06/21/2021    8:42 AM 03/21/2021    7:53 AM 02/16/2021    9:07 AM 02/06/2021    9:27 AM 12/19/2020    8:27 AM 09/21/2020    7:54 AM  PHQ 2/9 Scores  PHQ - 2 Score 0 0 0 0 0 0 0    Fall Risk    06/22/2021    2:47 PM 06/21/2021    8:42 AM 03/21/2021    7:53 AM 02/16/2021    9:07 AM 02/06/2021    9:27 AM  Fall Risk   Falls in the past  year? 0 0 0 0 0  Number falls in past yr: 0      Injury with Fall? 0      Risk for fall due to : No Fall Risks      Follow up Falls prevention discussed        [place]:  Any stairs in or around the home? Yes  If so, are there any without handrails? No  Home free of loose throw rugs in walkways, pet beds, electrical cords, etc? Yes  Adequate lighting in your home to reduce risk of falls? Yes   ASSISTIVE DEVICES UTILIZED TO PREVENT FALLS:  Life alert? No  Use of a cane, walker or w/c? No  Grab bars in the bathroom? Yes  Shower chair or bench in shower? Yes  Elevated toilet seat or a handicapped toilet? Yes   TIMED UP AND GO:  Was the test performed? No . Telephonic visit  Cognitive Function:    06/11/2017   10:41 AM 06/05/2016    9:19 AM  MMSE - Mini Mental State Exam  Orientation to time 5 5   Orientation to Place 5 5  Registration 3 3  Attention/ Calculation 5 5  Recall 3 3  Language- name 2 objects 2 2  Language- repeat 1 1  Language- follow 3 step command 3 3  Language- read & follow direction 1 1  Write a sentence 1 1  Copy design 1 1  Total score 30 30        06/22/2021    2:51 PM 06/21/2020    2:38 PM 06/21/2019    2:57 PM 06/18/2018    3:29 PM  6CIT Screen  What Year? 0 points 0 points 0 points 0 points  What month? 0 points 0 points 0 points 0 points  What time? 0 points 0 points 0 points 0 points  Count back from 20 0 points 0 points 0 points 0 points  Months in reverse 0 points 0 points 0 points 0 points  Repeat phrase 0 points 0 points 2 points 2 points  Total Score 0 points 0 points 2 points 2 points    Immunizations Immunization History  Administered Date(s) Administered   Fluad Quad(high Dose 65+) 11/30/2018, 12/07/2019, 12/19/2020   Influenza, High Dose Seasonal PF 11/08/2016, 10/10/2017   Influenza,inj,Quad PF,6+ Mos 10/21/2012, 11/11/2013, 12/23/2014, 10/05/2015   Influenza,inj,quad, With Preservative 10/07/2016   Moderna SARS-COV2 Booster Vaccination 04/25/2020   Moderna Sars-Covid-2 Vaccination 02/18/2019, 03/19/2019, 11/16/2019   Pneumococcal Conjugate-13 06/01/2014   Pneumococcal Polysaccharide-23 07/14/2012   Pneumococcal-Unspecified 01/08/2015   Tdap 07/15/2007    TDAP status: Due, Education has been provided regarding the importance of this vaccine. Advised may receive this vaccine at local pharmacy or Health Dept. Aware to provide a copy of the vaccination record if obtained from local pharmacy or Health Dept. Verbalized acceptance and understanding.  Flu Vaccine status: Up to date  Pneumococcal vaccine status: Up to date  Covid-19 vaccine status: Completed vaccines  Qualifies for Shingles Vaccine? Yes   Zostavax completed No   Shingrix Completed?: No.    Education has been provided regarding the importance of this vaccine.  Patient has been advised to call insurance company to determine out of pocket expense if they have not yet received this vaccine. Advised may also receive vaccine at local pharmacy or Health Dept. Verbalized acceptance and understanding.  Screening Tests Health Maintenance  Topic Date Due   Zoster Vaccines- Shingrix (1 of  2) Never done   COVID-19 Vaccine (4 - Moderna series) 07/07/2021 (Originally 06/20/2020)   TETANUS/TDAP  09/21/2021 (Originally 07/14/2017)   INFLUENZA VACCINE  08/07/2021   MAMMOGRAM  08/14/2021   COLONOSCOPY (Pts 45-69yr Insurance coverage will need to be confirmed)  08/26/2021   DEXA SCAN  11/21/2021   HEMOGLOBIN A1C  12/21/2021   OPHTHALMOLOGY EXAM  04/06/2022   FOOT EXAM  06/22/2022   Pneumonia Vaccine 76 Years old  Completed   Hepatitis C Screening  Completed   HPV VACCINES  Aged Out   Fecal DNA (Cologuard)  Discontinued    Health Maintenance  Health Maintenance Due  Topic Date Due   Zoster Vaccines- Shingrix (1 of 2) Never done    Colorectal cancer screening: Type of screening: Colonoscopy. Completed 08/27/2019. Repeat every 2 years Appointment 08/08/2021  Mammogram status: Completed 08/14/2020. Repeat every year  Bone Density status: Completed 11/22/2019. Results reflect: Bone density results: OSTEOPENIA. Repeat every 2 years.  Lung Cancer Screening: (Low Dose CT Chest recommended if Age 74-80years, 30 pack-year currently smoking OR have quit w/in 15years.) does not qualify.  Additional Screening:  Hepatitis C Screening: does qualify; Completed 03/23/2015  Vision Screening: Recommended annual ophthalmology exams for early detection of glaucoma and other disorders of the eye. Is the patient up to date with their annual eye exam?  Yes  Who is the provider or what is the name of the office in which the patient attends annual eye exams? [facility] If pt is not established with a provider, would they like to be referred to a provider to establish care? No .    Dental Screening: Recommended annual dental exams for proper oral hygiene  Community Resource Referral / Chronic Care Management: CRR required this visit?  No   CCM required this visit?  No      Plan:     I have personally reviewed and noted the following in the patient's chart:   Medical and social history Use of alcohol, tobacco or illicit drugs  Current medications and supplements including opioid prescriptions.  Functional ability and status Nutritional status Physical activity Advanced directives List of other physicians Hospitalizations, surgeries, and ER visits in previous 12 months Vitals Screenings to include cognitive, depression, and falls Referrals and appointments  In addition, I have reviewed and discussed with patient certain preventive protocols, quality metrics, and best practice recommendations. A written personalized care plan for preventive services as well as general preventive health recommendations were provided to patient.     ASandrea Hammond LPN   [id number]  Nurse Notes: None

## 2021-06-22 NOTE — Patient Instructions (Signed)
Rita Payne , Thank you for taking time to come for your Medicare Wellness Visit. I appreciate your ongoing commitment to your health goals. Please review the following plan we discussed and let me know if I can assist you in the future.   Screening recommendations/referrals: Colonoscopy: Done 08/27/2019 - repeat in 2 years - appointment 08/08/2021 Mammogram: Done 08/14/2020 - Repeat annually Bone Density: Done 11/22/2019 - Repeat every 2 years Recommended yearly ophthalmology/optometry visit for glaucoma screening and checkup Recommended yearly dental visit for hygiene and checkup  Vaccinations: Influenza vaccine: Done 12/19/2020 - Repeat annually  Pneumococcal vaccine: Done 07/14/2012 & 06/01/2014 Tdap vaccine: Done 07/15/2007 -Repeat in 10 years *due Shingles vaccine: Due - Shingrix is 2 doses 2-6 months apart and over 90% effective     Covid-19: Done 02/18/2019, 03/19/2019, 11/16/2019, & 04/25/2020  Advanced directives: Please bring a copy of your health care power of attorney and living will to the office to be added to your chart at your convenience.   Conditions/risks identified: Aim for 30 minutes of exercise or brisk walking, 6-8 glasses of water, and 5 servings of fruits and vegetables each day.   Next appointment: Follow up in one year for your annual wellness visit    Preventive Care 65 Years and Older, Female Preventive care refers to lifestyle choices and visits with your health care provider that can promote health and wellness. What does preventive care include? A yearly physical exam. This is also called an annual well check. Dental exams once or twice a year. Routine eye exams. Ask your health care provider how often you should have your eyes checked. Personal lifestyle choices, including: Daily care of your teeth and gums. Regular physical activity. Eating a healthy diet. Avoiding tobacco and drug use. Limiting alcohol use. Practicing safe sex. Taking low-dose aspirin every  day. Taking vitamin and mineral supplements as recommended by your health care provider. What happens during an annual well check? The services and screenings done by your health care provider during your annual well check will depend on your age, overall health, lifestyle risk factors, and family history of disease. Counseling  Your health care provider may ask you questions about your: Alcohol use. Tobacco use. Drug use. Emotional well-being. Home and relationship well-being. Sexual activity. Eating habits. History of falls. Memory and ability to understand (cognition). Work and work Statistician. Reproductive health. Screening  You may have the following tests or measurements: Height, weight, and BMI. Blood pressure. Lipid and cholesterol levels. These may be checked every 5 years, or more frequently if you are over 86 years old. Skin check. Lung cancer screening. You may have this screening every year starting at age 74 if you have a 30-pack-year history of smoking and currently smoke or have quit within the past 15 years. Fecal occult blood test (FOBT) of the stool. You may have this test every year starting at age 74. Flexible sigmoidoscopy or colonoscopy. You may have a sigmoidoscopy every 5 years or a colonoscopy every 10 years starting at age 74. Hepatitis C blood test. Hepatitis B blood test. Sexually transmitted disease (STD) testing. Diabetes screening. This is done by checking your blood sugar (glucose) after you have not eaten for a while (fasting). You may have this done every 1-3 years. Bone density scan. This is done to screen for osteoporosis. You may have this done starting at age 74. Mammogram. This may be done every 1-2 years. Talk to your health care provider about how often you should have regular  mammograms. Talk with your health care provider about your test results, treatment options, and if necessary, the need for more tests. Vaccines  Your health care  provider may recommend certain vaccines, such as: Influenza vaccine. This is recommended every year. Tetanus, diphtheria, and acellular pertussis (Tdap, Td) vaccine. You may need a Td booster every 10 years. Zoster vaccine. You may need this after age 74. Pneumococcal 13-valent conjugate (PCV13) vaccine. One dose is recommended after age 74. Pneumococcal polysaccharide (PPSV23) vaccine. One dose is recommended after age 75. Talk to your health care provider about which screenings and vaccines you need and how often you need them. This information is not intended to replace advice given to you by your health care provider. Make sure you discuss any questions you have with your health care provider. Document Released: 01/20/2015 Document Revised: 09/13/2015 Document Reviewed: 10/25/2014 Elsevier Interactive Patient Education  2017 [place] Prevention in the Home Falls can cause injuries. They can happen to people of all ages. There are many things you can do to make your home safe and to help prevent falls. What can I do on the outside of my home? Regularly fix the edges of walkways and driveways and fix any cracks. Remove anything that might make you trip as you walk through a door, such as a raised step or threshold. Trim any bushes or trees on the path to your home. Use bright outdoor lighting. Clear any walking paths of anything that might make someone trip, such as rocks or tools. Regularly check to see if handrails are loose or broken. Make sure that both sides of any steps have handrails. Any raised decks and porches should have guardrails on the edges. Have any leaves, snow, or ice cleared regularly. Use sand or salt on walking paths during winter. Clean up any spills in your garage right away. This includes oil or grease spills. What can I do in the bathroom? Use night lights. Install grab bars by the toilet and in the tub and shower. Do not use towel bars as grab  bars. Use non-skid mats or decals in the tub or shower. If you need to sit down in the shower, use a plastic, non-slip stool. Keep the floor dry. Clean up any water that spills on the floor as soon as it happens. Remove soap buildup in the tub or shower regularly. Attach bath mats securely with double-sided non-slip rug tape. Do not have throw rugs and other things on the floor that can make you trip. What can I do in the bedroom? Use night lights. Make sure that you have a light by your bed that is easy to reach. Do not use any sheets or blankets that are too big for your bed. They should not hang down onto the floor. Have a firm chair that has side arms. You can use this for support while you get dressed. Do not have throw rugs and other things on the floor that can make you trip. What can I do in the kitchen? Clean up any spills right away. Avoid walking on wet floors. Keep items that you use a lot in easy-to-reach places. If you need to reach something above you, use a strong step stool that has a grab bar. Keep electrical cords out of the way. Do not use floor polish or wax that makes floors slippery. If you must use wax, use non-skid floor wax. Do not have throw rugs and other things on the floor that can  make you trip. What can I do with my stairs? Do not leave any items on the stairs. Make sure that there are handrails on both sides of the stairs and use them. Fix handrails that are broken or loose. Make sure that handrails are as long as the stairways. Check any carpeting to make sure that it is firmly attached to the stairs. Fix any carpet that is loose or worn. Avoid having throw rugs at the top or bottom of the stairs. If you do have throw rugs, attach them to the floor with carpet tape. Make sure that you have a light switch at the top of the stairs and the bottom of the stairs. If you do not have them, ask someone to add them for you. What else can I do to help prevent  falls? Wear shoes that: Do not have high heels. Have rubber bottoms. Are comfortable and fit you well. Are closed at the toe. Do not wear sandals. If you use a stepladder: Make sure that it is fully opened. Do not climb a closed stepladder. Make sure that both sides of the stepladder are locked into place. Ask someone to hold it for you, if possible. Clearly mark and make sure that you can see: Any grab bars or handrails. First and last steps. Where the edge of each step is. Use tools that help you move around (mobility aids) if they are needed. These include: Canes. Walkers. Scooters. Crutches. Turn on the lights when you go into a dark area. Replace any light bulbs as soon as they burn out. Set up your furniture so you have a clear path. Avoid moving your furniture around. If any of your floors are uneven, fix them. If there are any pets around you, be aware of where they are. Review your medicines with your doctor. Some medicines can make you feel dizzy. This can increase your chance of falling. Ask your doctor what other things that you can do to help prevent falls. This information is not intended to replace advice given to you by your health care provider. Make sure you discuss any questions you have with your health care provider. Document Released: 10/20/2008 Document Revised: 06/01/2015 Document Reviewed: 01/28/2014 Elsevier Interactive Patient Education  2017 [employer].

## 2021-07-02 ENCOUNTER — Other Ambulatory Visit: Payer: Self-pay | Admitting: Family Medicine

## 2021-07-02 MED ORDER — FENOFIBRATE 145 MG PO TABS: 145.0000 mg | ORAL_TABLET | Freq: Every day | ORAL | 1 refills | Status: DC

## 2021-07-16 ENCOUNTER — Other Ambulatory Visit: Payer: Self-pay | Admitting: Family Medicine

## 2021-07-16 DIAGNOSIS — Z1231 Encounter for screening mammogram for malignant neoplasm of breast: Secondary | ICD-10-CM

## 2021-07-18 ENCOUNTER — Ambulatory Visit (AMBULATORY_SURGERY_CENTER): Payer: Self-pay | Admitting: *Deleted

## 2021-07-18 VITALS — Ht 64.0 in | Wt 175.0 lb

## 2021-07-18 DIAGNOSIS — Z86010 Personal history of colonic polyps: Secondary | ICD-10-CM

## 2021-07-18 MED ORDER — PLENVU 140 G PO SOLR: 1.0000 | ORAL | 0 refills | Status: DC

## 2021-07-18 NOTE — Progress Notes (Signed)
No egg or soy allergy known to patient  No issues known to pt with past sedation with any surgeries or procedures Patient denies ever being told they had issues or difficulty with intubation  No FH of Malignant Hyperthermia Pt is not on diet pills Pt is not on  home 02  Pt is not on blood thinners  Pt states  issues with constipation - she uses an occ OTC stool softener- it helps after a few days - has a BM 2 x a week - 2 day Plenvu - are hard stools  No A fib or A flutter Have any cardiac testing pending--pt denies  Pt instructed to use Singlecare.com or GoodRx for a price reduction on prep

## 2021-08-15 ENCOUNTER — Ambulatory Visit (AMBULATORY_SURGERY_CENTER): Payer: Medicare Other | Admitting: Gastroenterology

## 2021-08-15 ENCOUNTER — Encounter: Payer: Self-pay | Admitting: Gastroenterology

## 2021-08-15 VITALS — BP 98/55 | HR 89 | Temp 96.8°F | Resp 16 | Ht 64.0 in | Wt 175.0 lb

## 2021-08-15 DIAGNOSIS — K5730 Diverticulosis of large intestine without perforation or abscess without bleeding: Secondary | ICD-10-CM

## 2021-08-15 DIAGNOSIS — Z86010 Personal history of colonic polyps: Secondary | ICD-10-CM | POA: Diagnosis not present

## 2021-08-15 DIAGNOSIS — D123 Benign neoplasm of transverse colon: Secondary | ICD-10-CM | POA: Diagnosis not present

## 2021-08-15 DIAGNOSIS — D122 Benign neoplasm of ascending colon: Secondary | ICD-10-CM | POA: Diagnosis not present

## 2021-08-15 DIAGNOSIS — Z09 Encounter for follow-up examination after completed treatment for conditions other than malignant neoplasm: Secondary | ICD-10-CM

## 2021-08-15 MED ORDER — SODIUM CHLORIDE 0.9 % IV SOLN: 500.0000 mL | Freq: Once | INTRAVENOUS | Status: DC

## 2021-08-15 NOTE — Progress Notes (Signed)
PT taken to PACU. Monitors in place. VSS. Report given to RN. 

## 2021-08-15 NOTE — Progress Notes (Signed)
Called to room to assist during endoscopic procedure.  Patient ID and intended procedure confirmed with present staff. Received instructions for my participation in the procedure from the performing physician.  

## 2021-08-15 NOTE — Patient Instructions (Signed)
Handouts on polyps and diverticulosis given to you today   YOU HAD AN ENDOSCOPIC PROCEDURE TODAY AT THE [facility]:   Refer to the procedure report that was given to you for any specific questions about what was found during the examination.  If the procedure report does not answer your questions, please call your gastroenterologist to clarify.  If you requested that your care partner not be given the details of your procedure findings, then the procedure report has been included in a sealed envelope for you to review at your convenience later.  YOU SHOULD EXPECT: Some feelings of bloating in the abdomen. Passage of more gas than usual.  Walking can help get rid of the air that was put into your GI tract during the procedure and reduce the bloating. If you had a lower endoscopy (such as a colonoscopy or flexible sigmoidoscopy) you may notice spotting of blood in your stool or on the toilet paper. If you underwent a bowel prep for your procedure, you may not have a normal bowel movement for a few days.  Please Note:  You might notice some irritation and congestion in your nose or some drainage.  This is from the oxygen used during your procedure.  There is no need for concern and it should clear up in a day or so.  SYMPTOMS TO REPORT IMMEDIATELY:  Following lower endoscopy (colonoscopy or flexible sigmoidoscopy):  Excessive amounts of blood in the stool  Significant tenderness or worsening of abdominal pains  Swelling of the abdomen that is new, acute  Fever of 100F or higher  For urgent or emergent issues, a gastroenterologist can be reached at any hour by calling [phone]. Do not use MyChart messaging for urgent concerns.    DIET:  We do recommend a small meal at first, but then you may proceed to your regular diet.  Drink plenty of fluids but you should avoid alcoholic beverages for 24 hours.  ACTIVITY:  You should plan to take it easy for the rest of today and you should  NOT DRIVE or use heavy machinery until tomorrow (because of the sedation medicines used during the test).    FOLLOW UP: Our staff will call the number listed on your records the next business day following your procedure.  We will call around 7:15- 8:00 am to check on you and address any questions or concerns that you may have regarding the information given to you following your procedure. If we do not reach you, we will leave a message.  If you develop any symptoms (ie: fever, flu-like symptoms, shortness of breath, cough etc.) before then, please call [phone].  If you test positive for Covid 19 in the 2 weeks post procedure, please call and report this information to us.    If any biopsies were taken you will be contacted by phone or by letter within the next 1-3 weeks.  Please call us at [phone] if you have not heard about the biopsies in 3 weeks.    SIGNATURES/CONFIDENTIALITY: You and/or your care partner have signed paperwork which will be entered into your electronic medical record.  These signatures attest to the fact that that the information above on your After Visit Summary has been reviewed and is understood.  Full responsibility of the confidentiality of this discharge information lies with you and/or your care-partner.  

## 2021-08-15 NOTE — Op Note (Signed)
[facility] Patient Name: Declyn Offield Procedure Date: 08/15/2021 7:39 AM MRN: [id number] Endoscopist: Burchinal. Danis , MD Age: 76 Referring MD:  Date of Birth: 1945-02-19 Gender: Female Account #: [id number] Procedure:                Colonoscopy Indications:              Increased risk colon cancer surveillance: Personal                            history of adenoma (10 mm or greater in size)                           Many TA polyps 2019; TA and HP polyps (including                            EMR of large right colon TA) 2020; multiple TA and                            HP polyps 2021 (including one small residual at EMR                            site) Medicines:                Monitored Anesthesia Care Procedure:                Pre-Anesthesia Assessment:                           - Prior to the procedure, a History and Physical                            was performed, and patient medications and                            allergies were reviewed. The patient's tolerance of                            previous anesthesia was also reviewed. The risks                            and benefits of the procedure and the sedation                            options and risks were discussed with the patient.                            All questions were answered, and informed consent                            was obtained. Prior Anticoagulants: The patient has                            taken no previous anticoagulant or antiplatelet  agents. ASA Grade Assessment: III - A patient with                            severe systemic disease. After reviewing the risks                            and benefits, the patient was deemed in                            satisfactory condition to undergo the procedure.                           After obtaining informed consent, the colonoscope                            was passed under direct vision. Throughout the                             procedure, the patient's blood pressure, pulse, and                            oxygen saturations were monitored continuously. The                            CF [id number] #[id number] was introduced through the anus                            and advanced to the the cecum, identified by                            appendiceal orifice and ileocecal valve. The                            colonoscopy was performed without difficulty. The                            patient tolerated the procedure well. The quality                            of the bowel preparation was good. The ileocecal                            valve, appendiceal orifice, and rectum were                            photographed. The bowel preparation used was 2 day                            Suprep/Miralax. Scope In: 8:09:59 AM Scope Out: 8:30:22 AM Scope Withdrawal Time: 0 hours 14 minutes 27 seconds  Total Procedure Duration: 0 hours 20 minutes 23 seconds  Findings:                 The perianal and digital rectal examinations were  normal.                           Repeat examination of right colon under NBI                            performed.                           A few small-mouthed diverticula were found in the                            right colon.                           Two sessile polyps were found in the transverse                            colon and ascending colon. The polyps were 2 to 6                            mm in size. These polyps were removed with a cold                            snare. Resection and retrieval were complete.                           A tattoo was seen in the ascending colon. The                            tattoo site appeared normal.                           A post polypectomy scar was found in the proximal                            transverse colon. The scar tissue was healthy in                            appearance. There was no  evidence of the previous                            polyp.                           The exam was otherwise without abnormality on                            direct and retroflexion views. Complications:            No immediate complications. Estimated Blood Loss:     Estimated blood loss was minimal. Impression:               - Diverticulosis in the right colon.                           -  Two 2 to 5 mm polyps in the transverse colon and                            in the ascending colon, removed with a cold snare.                            Resected and retrieved.                           - A tattoo was seen in the ascending colon. The                            tattoo site appeared normal.                           - Post-polypectomy scar in the proximal transverse                            colon.                           - The examination was otherwise normal on direct                            and retroflexion views. Recommendation:           - Patient has a contact number available for                            emergencies. The signs and symptoms of potential                            delayed complications were discussed with the                            patient. Return to normal activities tomorrow.                            Written discharge instructions were provided to the                            patient.                           - Resume previous diet.                           - Continue present medications.                           - Await pathology results.                           - Repeat colonoscopy is recommended for                            surveillance.  The colonoscopy date will be                            determined after pathology results from today's                            exam become available for review. (3 years if                            either polyp adenomatous or SSP) Camber Ninh L. Loletha Carrow, MD 08/15/2021 8:37:06 AM This report has been  signed electronically.

## 2021-08-15 NOTE — Progress Notes (Signed)
Pt's states no medical or surgical changes since previsit or office visit. 

## 2021-08-15 NOTE — Progress Notes (Signed)
History and Physical:  This patient presents for endoscopic testing for: Encounter Diagnosis  Name Primary?   Personal history of colonic polyps Yes  76 year old woman here for surveillance colonoscopy. Multiple adenomatous colon polyps in 2019, 2020 and 2021.  Patient is otherwise without complaints or active issues today.   Past Medical History: Past Medical History:  Diagnosis Date   Allergy    Arthritis    Basal cell carcinoma    Cataract    Diarrhea    First degree heart block    GERD (gastroesophageal reflux disease)    History of kidney stones    Hyperlipidemia    Hypertension    Leg fracture    Melanoma (HCC)    Mild intermittent asthma    Neuromuscular disorder (HCC)    neuropathy feet - mild   SUI (stress urinary incontinence, female)    Type 2 diabetes mellitus ([place])      Past Surgical History: Past Surgical History:  Procedure Laterality Date   ABDOMINAL HYSTERECTOMY  1987   BELPHAROPTOSIS REPAIR Bilateral 2009   9    CHOLECYSTECTOMY  1989   COLONOSCOPY     HERNIA REPAIR     kidney tumor removal   1986   KNEE ARTHROSCOPY Bilateral    LUMBAR LAMINECTOMY/DECOMPRESSION MICRODISCECTOMY N/A 12/08/2013   Procedure: LUMBAR DECOMPRESSION L4-L5,  L3-L4 ;  Surgeon: Johnn Hai, MD;  Location: [facility];  Service: Orthopedics;  Laterality: N/A;   POLYPECTOMY     PUBOVAGINAL SLING N/A 10/03/2014   Procedure: Gaynelle Arabian;  Surgeon: Carolan Clines, MD;  Location: [facility];  Service: Urology;  Laterality: N/A;   REPLACEMENT TOTAL HIP W/  RESURFACING IMPLANTS Right    TONSILLECTOMY     TOTAL HIP ARTHROPLASTY Right 08/25/2012   Procedure: RIGHT TOTAL HIP ARTHROPLASTY ANTERIOR APPROACH;  Surgeon: [name], MD;  Location: [facility];  Service: Orthopedics;  Laterality: Right;    Allergies: Allergies  Allergen Reactions   Ivp Dye [Iodinated Contrast Media] Anaphylaxis   Shellfish Allergy Anaphylaxis   Vasotec [Enalapril] Shortness  Of Breath   Adhesive [Tape] Other (See Comments)    blisters   Amlodipine     swelling   Gabapentin Other (See Comments)    drowsiness   Iodine Other (See Comments)    Iodine that is applied to skin causes blisters    Pyridium [Phenazopyridine Hcl]     blisters    Outpatient Meds: Current Outpatient Medications  Medication Sig Dispense Refill   albuterol (VENTOLIN HFA) 108 (90 Base) MCG/ACT inhaler USE 2 INHALATIONS EVERY 6 HOURS AS NEEDED FOR WHEEZING OR SHORTNESS OF BREATH, NEED FOR CHRONIC ASTHMA. (Patient taking differently: Inhale 2 puffs into the lungs every 6 (six) hours as needed for shortness of breath.) 25.5 g 3   aspirin 81 MG tablet Take 1 tablet (81 mg total) by mouth daily. Resume 4 days post-op 30 tablet    atorvastatin (LIPITOR) 80 MG tablet TAKE 1 TABLET DAILY 90 tablet 0   celecoxib (CELEBREX) 200 MG capsule TAKE 1 CAPSULE EVERY 12 HOURS 180 capsule 3   cetirizine (ZYRTEC) 10 MG tablet Take 1 tablet (10 mg total) by mouth daily. 90 tablet 3   cholecalciferol (VITAMIN D) 1000 UNITS tablet Take 1,000 Units by mouth daily.     cloNIDine (CATAPRES) 0.1 MG tablet Take 1 tablet (0.1 mg total) by mouth 2 (two) times daily. 180 tablet 3   empagliflozin (JARDIANCE) 25 MG TABS tablet Take 1 tablet (25 mg  total) by mouth daily before breakfast. 90 tablet 3   fenofibrate (TRICOR) 145 MG tablet Take 1 tablet (145 mg total) by mouth daily. 90 tablet 1   Fluticasone-Salmeterol (ADVAIR DISKUS) 250-50 MCG/DOSE AEPB USE 1 INHALATION EVERY 12 HOURS 180 each 3   glimepiride (AMARYL) 4 MG tablet Take 1 tablet (4 mg total) by mouth daily with breakfast. 90 tablet 3   glucose blood (FREESTYLE LITE) test strip USE TO TEST DAILY 100 strip 11   MICARDIS HCT 80-25 MG tablet TAKE 1 TABLET EVERY MORNING 90 tablet 0   omeprazole (PRILOSEC) 40 MG capsule TAKE 1 CAPSULE DAILY 90 capsule 3   TRULICITY 6.78 LF/8.1OF SOPN INJECT 0.75 MG (0.5 ML) UNDER THE SKIN ONCE WEEKLY 6 mL 0   diclofenac Sodium  (VOLTAREN) 1 % GEL Apply 2 g topically 4 (four) times daily. 100 g 2   docusate sodium (COLACE) 100 MG capsule Take 1 capsule (100 mg total) by mouth 2 (two) times daily. To soften stool 180 capsule 3   Estradiol (VAGIFEM) 10 MCG TABS vaginal tablet Place 1 tablet (10 mcg total) vaginally 2 (two) times a week. 24 tablet 3   Current Facility-Administered Medications  Medication Dose Route Frequency Provider Last Rate Last Admin   0.9 %  sodium chloride infusion  500 mL Intravenous Once Doran Stabler, MD       Facility-Administered Medications Ordered in Other Visits  Medication Dose Route Frequency Provider Last Rate Last Admin   magnesium citrate solution 1 Bottle  1 Bottle Oral Once Carolan Clines, MD       sodium phosphate (FLEET) 7-19 GM/118ML enema 1 enema  1 enema Rectal Once Carolan Clines, MD          ___________________________________________________________________ Objective   Exam:  BP 129/65   Pulse (!) 108   Temp (!) 96.8 F (36 C) (Temporal)   Ht '5\' 4"'$  (1.626 m)   Wt 175 lb (79.4 kg)   LMP 04/14/1982   SpO2 96%   BMI 30.04 kg/m   CV: RRR without murmur, S1/S2 Resp: clear to auscultation bilaterally, normal RR and effort noted GI: soft, no tenderness, with active bowel sounds.   Assessment: Encounter Diagnosis  Name Primary?   Personal history of colonic polyps Yes     Plan: Colonoscopy  The benefits and risks of the planned procedure were described in detail with the patient or (when appropriate) their health care proxy.  Risks were outlined as including, but not limited to, bleeding, infection, perforation, adverse medication reaction leading to cardiac or pulmonary decompensation, pancreatitis (if ERCP).  The limitation of incomplete mucosal visualization was also discussed.  No guarantees or warranties were given.    The patient is appropriate for an endoscopic procedure in the ambulatory setting.   - Wilfrid Lund, MD

## 2021-08-16 ENCOUNTER — Telehealth: Payer: Self-pay | Admitting: *Deleted

## 2021-08-16 NOTE — Telephone Encounter (Signed)
Attempted f/u phone call. No answer. Left message. °

## 2021-08-17 ENCOUNTER — Encounter: Payer: Self-pay | Admitting: Gastroenterology

## 2021-08-23 ENCOUNTER — Other Ambulatory Visit: Payer: Self-pay | Admitting: Family Medicine

## 2021-08-23 DIAGNOSIS — Z780 Asymptomatic menopausal state: Secondary | ICD-10-CM

## 2021-08-28 ENCOUNTER — Other Ambulatory Visit: Payer: Self-pay | Admitting: Family Medicine

## 2021-08-28 DIAGNOSIS — E119 Type 2 diabetes mellitus without complications: Secondary | ICD-10-CM

## 2021-08-31 ENCOUNTER — Ambulatory Visit
Admission: RE | Admit: 2021-08-31 | Discharge: 2021-08-31 | Disposition: A | Discharge status: Home / Self Care | Payer: Medicare Other | Source: Ambulatory Visit | Attending: Family Medicine | Admitting: Family Medicine

## 2021-08-31 DIAGNOSIS — Z1231 Encounter for screening mammogram for malignant neoplasm of breast: Secondary | ICD-10-CM

## 2021-09-10 ENCOUNTER — Other Ambulatory Visit: Payer: Self-pay | Admitting: Family Medicine

## 2021-09-10 DIAGNOSIS — I10 Essential (primary) hypertension: Secondary | ICD-10-CM

## 2021-09-19 ENCOUNTER — Other Ambulatory Visit: Payer: Self-pay | Admitting: Family Medicine

## 2021-09-19 DIAGNOSIS — E119 Type 2 diabetes mellitus without complications: Secondary | ICD-10-CM

## 2021-09-20 ENCOUNTER — Encounter: Payer: Self-pay | Admitting: Family Medicine

## 2021-09-20 ENCOUNTER — Ambulatory Visit (INDEPENDENT_AMBULATORY_CARE_PROVIDER_SITE_OTHER): Payer: Medicare Other | Admitting: Family Medicine

## 2021-09-20 ENCOUNTER — Telehealth: Payer: Self-pay | Admitting: Family Medicine

## 2021-09-20 VITALS — BP 98/58 | HR 88 | Temp 97.5°F | Ht 64.0 in | Wt 179.2 lb

## 2021-09-20 DIAGNOSIS — E785 Hyperlipidemia, unspecified: Secondary | ICD-10-CM

## 2021-09-20 DIAGNOSIS — E1169 Type 2 diabetes mellitus with other specified complication: Secondary | ICD-10-CM | POA: Diagnosis not present

## 2021-09-20 DIAGNOSIS — M7122 Synovial cyst of popliteal space [Baker], left knee: Secondary | ICD-10-CM | POA: Diagnosis not present

## 2021-09-20 DIAGNOSIS — E1141 Type 2 diabetes mellitus with diabetic mononeuropathy: Secondary | ICD-10-CM

## 2021-09-20 DIAGNOSIS — I10 Essential (primary) hypertension: Secondary | ICD-10-CM

## 2021-09-20 DIAGNOSIS — R002 Palpitations: Secondary | ICD-10-CM

## 2021-09-20 LAB — BAYER DCA HB A1C WAIVED: HB A1C (BAYER DCA - WAIVED): 6.3 % — ABNORMAL HIGH (ref 4.8–5.6)

## 2021-09-20 MED ORDER — CETIRIZINE HCL 10 MG PO TABS: 10.0000 mg | ORAL_TABLET | Freq: Every day | ORAL | 3 refills | Status: DC

## 2021-09-20 MED ORDER — CELECOXIB 200 MG PO CAPS: ORAL_CAPSULE | ORAL | 3 refills | Status: DC

## 2021-09-20 MED ORDER — CLONIDINE HCL 0.1 MG PO TABS: 0.1000 mg | ORAL_TABLET | Freq: Two times a day (BID) | ORAL | 3 refills | Status: DC

## 2021-09-20 MED ORDER — ATORVASTATIN CALCIUM 80 MG PO TABS: 80.0000 mg | ORAL_TABLET | Freq: Every day | ORAL | 2 refills | Status: DC

## 2021-09-20 NOTE — Progress Notes (Signed)
Subjective:  Patient ID: Rita Payne,  female    DOB: [birth year]  Age: 76 y.o.    CC: Medical Management of Chronic Issues   HPI Rita Payne presents for  follow-up of hypertension. Patient has no history of headache chest pain or shortness of breath or recent cough. Patient also denies symptoms of TIA such as numbness weakness lateralizing. Patient denies side effects from medication. States taking it regularly.  Patient also  in for follow-up of elevated cholesterol. Doing well without complaints on current medication. Denies side effects  including myalgia and arthralgia and nausea. Also in today for liver function testing. Currently no chest pain, shortness of breath or other cardiovascular related symptoms noted.  Follow-up of diabetes. Patient does check blood sugar at home. Readings run between 110 and 140. Patient denies symptoms such as excessive hunger or urinary frequency, excessive hunger, nausea No significant hypoglycemic spells noted. Medications reviewed. Pt reports taking them regularly. Pt. denies complication/adverse reaction today.   Palpitations reported. Onset -  2 mos ago  Frequency - X4, duration - a minute or two. No accompanying chest pain or dyspnea.   History Rita Payne has a past medical history of Allergy, Arthritis, Basal cell carcinoma, Cataract, Diarrhea, First degree heart block, GERD (gastroesophageal reflux disease), History of kidney stones, Hyperlipidemia, Hypertension, Leg fracture, Melanoma ([place]), Mild intermittent asthma, Neuromuscular disorder ([place]), SUI (stress urinary incontinence, female), and Type 2 diabetes mellitus ([place]).   She has a past surgical history that includes Tonsillectomy; Abdominal hysterectomy (1987); Cholecystectomy (1989); Knee arthroscopy (Bilateral); Hernia repair; kidney tumor removal  (1986); Blepharoptosis repair (Bilateral, 2009); Total hip arthroplasty (Right, 08/25/2012); Lumbar laminectomy/decompression microdiscectomy  (N/A, 12/08/2013); Pubovaginal sling (N/A, 10/03/2014); Replacement total hip w/  resurfacing implants (Right); Colonoscopy; and Polypectomy.   Her family history includes Arrhythmia in her brother; Arthritis/Rheumatoid in her brother and sister; Colon polyps in her sister and sister; Diabetes in her mother, sister, and sister; Heart attack in her brother and mother; Heart disease in her brother, father, mother, and sister; Heart failure in her brother and sister; Stroke in her father, mother, and sister; Vision loss in her brother.She reports that she has been smoking cigarettes. She has a 8.25 pack-year smoking history. She has never used smokeless tobacco. She reports that she does not drink alcohol and does not use drugs.  Current Outpatient Medications on File Prior to Visit  Medication Sig Dispense Refill   albuterol (VENTOLIN HFA) 108 (90 Base) MCG/ACT inhaler USE 2 INHALATIONS EVERY 6 HOURS AS NEEDED FOR WHEEZING OR SHORTNESS OF BREATH, NEED FOR CHRONIC ASTHMA. (Patient taking differently: Inhale 2 puffs into the lungs every 6 (six) hours as needed for shortness of breath.) 25.5 g 3   aspirin 81 MG tablet Take 1 tablet (81 mg total) by mouth daily. Resume 4 days post-op 30 tablet    cholecalciferol (VITAMIN D) 1000 UNITS tablet Take 1,000 Units by mouth daily.     diclofenac Sodium (VOLTAREN) 1 % GEL Apply 2 g topically 4 (four) times daily. 100 g 2   docusate sodium (COLACE) 100 MG capsule Take 1 capsule (100 mg total) by mouth 2 (two) times daily. To soften stool 180 capsule 3   empagliflozin (JARDIANCE) 25 MG TABS tablet Take 1 tablet (25 mg total) by mouth daily before breakfast. 90 tablet 3   fenofibrate (TRICOR) 145 MG tablet Take 1 tablet (145 mg total) by mouth daily. 90 tablet 1   Fluticasone-Salmeterol (ADVAIR DISKUS) 250-50 MCG/DOSE AEPB USE 1 INHALATION EVERY  12 HOURS 180 each 3   glimepiride (AMARYL) 4 MG tablet Take 1 tablet (4 mg total) by mouth daily with breakfast. 90 tablet 3    glucose blood (FREESTYLE LITE) test strip USE TO TEST DAILY 100 strip 11   MAGNESIUM PO Take by mouth.     MICARDIS HCT 80-25 MG tablet TAKE 1 TABLET EVERY MORNING 90 tablet 0   omeprazole (PRILOSEC) 40 MG capsule TAKE 1 CAPSULE DAILY 90 capsule 3   TRULICITY 0.75 MG/0.5ML SOPN INJECT 0.75 MG (0.5 ML) UNDER THE SKIN ONCE WEEKLY 6 mL 0   YUVAFEM 10 MCG TABS vaginal tablet INSERT 1 TABLET VAGINALLY TWICE A WEEK 24 tablet 0   Current Facility-Administered Medications on File Prior to Visit  Medication Dose Route Frequency Provider Last Rate Last Admin   magnesium citrate solution 1 Bottle  1 Bottle Oral Once Jethro Bolus, MD       sodium phosphate (FLEET) 7-19 GM/118ML enema 1 enema  1 enema Rectal Once Jethro Bolus, MD        ROS Review of Systems  Constitutional: Negative.   HENT: Negative.    Eyes:  Negative for visual disturbance.  Respiratory:  Negative for shortness of breath.   Cardiovascular:  Negative for chest pain.  Gastrointestinal:  Negative for abdominal pain.  Musculoskeletal:  Negative for arthralgias.    Objective:  BP (!) 98/58   Pulse 88   Temp (!) 97.5 F (36.4 C)   Ht 5\' 4"  (1.626 m)   Wt 179 lb 3.2 oz (81.3 kg)   LMP 04/14/1982   SpO2 93%   BMI 30.76 kg/m   BP Readings from Last 3 Encounters:  09/20/21 (!) 98/58  08/15/21 (!) 98/55  06/21/21 113/62    Wt Readings from Last 3 Encounters:  09/20/21 179 lb 3.2 oz (81.3 kg)  08/15/21 175 lb (79.4 kg)  07/18/21 175 lb (79.4 kg)     Physical Exam Constitutional:      General: She is not in acute distress.    Appearance: She is well-developed.  Cardiovascular:     Rate and Rhythm: Normal rate and regular rhythm.  Pulmonary:     Breath sounds: Normal breath sounds.  Musculoskeletal:        General: Normal range of motion.  Skin:    General: Skin is warm and dry.  Neurological:     Mental Status: She is alert and oriented to person, place, and time.     Diabetic Foot Exam -  Simple   No data filed     Lab Results  Component Value Date   HGBA1C 6.2 (H) 06/21/2021   HGBA1C 6.0 (H) 03/21/2021   HGBA1C 6.0 (H) 12/19/2020    Assessment & Plan:   Rita Payne was seen today for medical management of chronic issues.  Diagnoses and all orders for this visit:  Type 2 diabetes mellitus with diabetic mononeuropathy, without long-term current use of insulin (HCC) -     Bayer DCA Hb A1c Waived -     Microalbumin / creatinine urine ratio  Essential hypertension -     CBC with Differential/Platelet -     CMP14+EGFR -     cloNIDine (CATAPRES) 0.1 MG tablet; Take 1 tablet (0.1 mg total) by mouth 2 (two) times daily.  Hyperlipidemia associated with type 2 diabetes mellitus (HCC) -     Lipid panel  Palpitations -     EKG 12-Lead  Baker's cyst of knee, left  Other orders -  atorvastatin (LIPITOR) 80 MG tablet; Take 1 tablet (80 mg total) by mouth daily. -     celecoxib (CELEBREX) 200 MG capsule; TAKE 1 CAPSULE EVERY 12 HOURS -     cetirizine (ZYRTEC) 10 MG tablet; Take 1 tablet (10 mg total) by mouth daily.   I have changed Rita Befort "Sandy"'s atorvastatin. I am also having her maintain her cholecalciferol, aspirin, Fluticasone-Salmeterol, empagliflozin, glimepiride, docusate sodium, FREESTYLE LITE, albuterol, omeprazole, diclofenac Sodium, fenofibrate, Yuvafem, Trulicity, Micardis HCT, celecoxib, cetirizine, cloNIDine, and MAGNESIUM PO.  Meds ordered this encounter  Medications   atorvastatin (LIPITOR) 80 MG tablet    Sig: Take 1 tablet (80 mg total) by mouth daily.    Dispense:  90 tablet    Refill:  2   celecoxib (CELEBREX) 200 MG capsule    Sig: TAKE 1 CAPSULE EVERY 12 HOURS    Dispense:  180 capsule    Refill:  3   cetirizine (ZYRTEC) 10 MG tablet    Sig: Take 1 tablet (10 mg total) by mouth daily.    Dispense:  90 tablet    Refill:  3   cloNIDine (CATAPRES) 0.1 MG tablet    Sig: Take 1 tablet (0.1 mg total) by mouth 2 (two) times daily.     Dispense:  180 tablet    Refill:  3     Follow-up: Return in about 3 months (around 12/20/2021).  Claretta Fraise, M.D.

## 2021-09-20 NOTE — Telephone Encounter (Signed)
  Prescription Request  09/20/2021  Is this a "Controlled Substance" medicine? no  Have you seen your PCP in the last 2 weeks? Yes   If YES, route message to pool  -  If NO, patient needs to be scheduled for appointment.  What is the name of the medication or equipment? cloNIDine (CATAPRES) 0.1 MG tablet  Have you contacted your pharmacy to request a refill? Yes    Which pharmacy would you like this sent to? [facility], [place]   Patient notified that their request is being sent to the clinical staff for review and that they should receive a response within 2 business days.

## 2021-09-21 LAB — CMP14+EGFR
ALT: 19 IU/L (ref 0–32)
AST: 18 IU/L (ref 0–40)
Albumin/Globulin Ratio: 1.8 (ref 1.2–2.2)
Albumin: 4.5 g/dL (ref 3.8–4.8)
Alkaline Phosphatase: 73 IU/L (ref 44–121)
BUN/Creatinine Ratio: 25 (ref 12–28)
BUN: 20 mg/dL (ref 8–27)
Bilirubin Total: 0.6 mg/dL (ref 0.0–1.2)
CO2: 22 mmol/L (ref 20–29)
Calcium: 10.1 mg/dL (ref 8.7–10.3)
Chloride: 100 mmol/L (ref 96–106)
Creatinine, Ser: 0.79 mg/dL (ref 0.57–1.00)
Globulin, Total: 2.5 g/dL (ref 1.5–4.5)
Glucose: 150 mg/dL — ABNORMAL HIGH (ref 70–99)
Potassium: 3.9 mmol/L (ref 3.5–5.2)
Sodium: 140 mmol/L (ref 134–144)
Total Protein: 7 g/dL (ref 6.0–8.5)
eGFR: 78 mL/min/{1.73_m2} (ref >59)

## 2021-09-21 LAB — CBC WITH DIFFERENTIAL/PLATELET
Basophils Absolute: 0.1 10*3/uL (ref 0.0–0.2)
Basos: 1 %
EOS (ABSOLUTE): 0.2 10*3/uL (ref 0.0–0.4)
Eos: 1 %
Hematocrit: 44.7 % (ref 34.0–46.6)
Hemoglobin: 15.3 g/dL (ref 11.1–15.9)
Immature Grans (Abs): 0 10*3/uL (ref 0.0–0.1)
Immature Granulocytes: 0 %
Lymphocytes Absolute: 2.5 10*3/uL (ref 0.7–3.1)
Lymphs: 22 %
MCH: 32 pg (ref 26.6–33.0)
MCHC: 34.2 g/dL (ref 31.5–35.7)
MCV: 94 fL (ref 79–97)
Monocytes Absolute: 1 10*3/uL — ABNORMAL HIGH (ref 0.1–0.9)
Monocytes: 9 %
Neutrophils Absolute: 7.4 10*3/uL — ABNORMAL HIGH (ref 1.4–7.0)
Neutrophils: 67 %
Platelets: 148 10*3/uL — ABNORMAL LOW (ref 150–450)
RBC: 4.78 x10E6/uL (ref 3.77–5.28)
RDW: 14.1 % (ref 11.7–15.4)
WBC: 11.1 10*3/uL — ABNORMAL HIGH (ref 3.4–10.8)

## 2021-09-21 LAB — LIPID PANEL
Chol/HDL Ratio: 4.5 ratio — ABNORMAL HIGH (ref 0.0–4.4)
Cholesterol, Total: 149 mg/dL (ref 100–199)
HDL: 33 mg/dL — ABNORMAL LOW (ref >39)
LDL Chol Calc (NIH): 58 mg/dL (ref 0–99)
Triglycerides: 374 mg/dL — ABNORMAL HIGH (ref 0–149)
VLDL Cholesterol Cal: 58 mg/dL — ABNORMAL HIGH (ref 5–40)

## 2021-09-21 MED ORDER — CLONIDINE HCL 0.1 MG PO TABS: 0.1000 mg | ORAL_TABLET | Freq: Two times a day (BID) | ORAL | 3 refills | Status: DC

## 2021-09-21 MED ORDER — ATORVASTATIN CALCIUM 80 MG PO TABS: 80.0000 mg | ORAL_TABLET | Freq: Every day | ORAL | 2 refills | Status: DC

## 2021-09-21 MED ORDER — CELECOXIB 200 MG PO CAPS: ORAL_CAPSULE | ORAL | 3 refills | Status: DC

## 2021-09-21 MED ORDER — CETIRIZINE HCL 10 MG PO TABS: 10.0000 mg | ORAL_TABLET | Freq: Every day | ORAL | 3 refills | Status: DC

## 2021-09-21 NOTE — Telephone Encounter (Addendum)
The 4 rx's sent in yesterday went to [facility] and they need to go to [facility]. Pt has already called [facility] and cancelled so I resent medications to [facility] and pt is aware.

## 2021-09-22 LAB — MICROALBUMIN / CREATININE URINE RATIO
Creatinine, Urine: 54.6 mg/dL
Microalb/Creat Ratio: 16 mg/g creat (ref 0–29)
Microalbumin, Urine: 8.9 ug/mL

## 2021-10-05 DIAGNOSIS — M25562 Pain in left knee: Secondary | ICD-10-CM | POA: Diagnosis not present

## 2021-11-15 ENCOUNTER — Other Ambulatory Visit: Payer: Self-pay | Admitting: Family Medicine

## 2021-11-15 DIAGNOSIS — Z780 Asymptomatic menopausal state: Secondary | ICD-10-CM

## 2021-11-20 ENCOUNTER — Other Ambulatory Visit: Payer: Self-pay | Admitting: Family Medicine

## 2021-11-20 DIAGNOSIS — E119 Type 2 diabetes mellitus without complications: Secondary | ICD-10-CM

## 2021-11-22 DIAGNOSIS — M1712 Unilateral primary osteoarthritis, left knee: Secondary | ICD-10-CM | POA: Diagnosis not present

## 2021-11-22 DIAGNOSIS — M25562 Pain in left knee: Secondary | ICD-10-CM | POA: Diagnosis not present

## 2021-11-26 ENCOUNTER — Other Ambulatory Visit: Payer: Self-pay | Admitting: Family Medicine

## 2021-11-26 DIAGNOSIS — J4520 Mild intermittent asthma, uncomplicated: Secondary | ICD-10-CM

## 2021-12-06 ENCOUNTER — Ambulatory Visit (INDEPENDENT_AMBULATORY_CARE_PROVIDER_SITE_OTHER): Payer: Medicare Other

## 2021-12-06 DIAGNOSIS — Z23 Encounter for immunization: Secondary | ICD-10-CM

## 2021-12-10 ENCOUNTER — Other Ambulatory Visit: Payer: Self-pay | Admitting: Family Medicine

## 2021-12-10 DIAGNOSIS — I10 Essential (primary) hypertension: Secondary | ICD-10-CM

## 2021-12-26 ENCOUNTER — Ambulatory Visit (INDEPENDENT_AMBULATORY_CARE_PROVIDER_SITE_OTHER): Payer: Medicare Other | Admitting: Family Medicine

## 2021-12-26 ENCOUNTER — Encounter: Payer: Self-pay | Admitting: Family Medicine

## 2021-12-26 VITALS — BP 108/62 | HR 92 | Temp 97.6°F | Ht 64.0 in | Wt 179.4 lb

## 2021-12-26 DIAGNOSIS — I10 Essential (primary) hypertension: Secondary | ICD-10-CM | POA: Diagnosis not present

## 2021-12-26 DIAGNOSIS — E1169 Type 2 diabetes mellitus with other specified complication: Secondary | ICD-10-CM

## 2021-12-26 DIAGNOSIS — E785 Hyperlipidemia, unspecified: Secondary | ICD-10-CM | POA: Diagnosis not present

## 2021-12-26 DIAGNOSIS — E119 Type 2 diabetes mellitus without complications: Secondary | ICD-10-CM | POA: Diagnosis not present

## 2021-12-26 DIAGNOSIS — E1141 Type 2 diabetes mellitus with diabetic mononeuropathy: Secondary | ICD-10-CM | POA: Diagnosis not present

## 2021-12-26 MED ORDER — GLIMEPIRIDE 4 MG PO TABS: 4.0000 mg | ORAL_TABLET | Freq: Every day | ORAL | 3 refills | Status: DC

## 2021-12-26 MED ORDER — FENOFIBRATE 145 MG PO TABS: 145.0000 mg | ORAL_TABLET | Freq: Every day | ORAL | 1 refills | Status: DC

## 2021-12-26 MED ORDER — TRULICITY 1.5 MG/0.5ML ~~LOC~~ SOAJ: SUBCUTANEOUS | 1 refills | Status: DC

## 2021-12-26 NOTE — Progress Notes (Signed)
Subjective:  Patient ID: Rita Payne,  female    DOB: 1945/08/23  Age: 77 y.o.    CC: Medical Management of Chronic Issues   HPI Rita Payne presents for  follow-up of hypertension. Patient has no history of headache chest pain or shortness of breath or recent cough. Patient also denies symptoms of TIA such as numbness weakness lateralizing. Patient denies side effects from medication. States taking it regularly.  Patient also  in for follow-up of elevated cholesterol. Doing well without complaints on current medication. Denies side effects  including myalgia and arthralgia and nausea. Also in today for liver function testing. Currently no chest pain, shortness of breath or other cardiovascular related symptoms noted.  Follow-up of diabetes. Patient does check blood sugar at home. Readings run between 106 and 135 fasting. Patient denies symptoms such as excessive hunger or urinary frequency, excessive hunger, nausea No significant hypoglycemic spells noted. Medications reviewed. Pt reports taking them regularly. Pt. denies complication/adverse reaction today.   Getting left knee replacement on April 1, with Dr. Wynelle Link.   History Rita Payne has a past medical history of Allergy, Arthritis, Basal cell carcinoma, Cataract, Diarrhea, First degree heart block, GERD (gastroesophageal reflux disease), History of kidney stones, Hyperlipidemia, Hypertension, Leg fracture, Melanoma ([place]), Mild intermittent asthma, Neuromuscular disorder ([place]), SUI (stress urinary incontinence, female), and Type 2 diabetes mellitus ([place]).   She has a past surgical history that includes Tonsillectomy; Abdominal hysterectomy (1987); Cholecystectomy (1989); Knee arthroscopy (Bilateral); Hernia repair; kidney tumor removal  (1986); Blepharoptosis repair (Bilateral, 2009); Total hip arthroplasty (Right, 08/25/2012); Lumbar laminectomy/decompression microdiscectomy (N/A, 12/08/2013); Pubovaginal sling (N/A, 10/03/2014);  Replacement total hip w/  resurfacing implants (Right); Colonoscopy; and Polypectomy.   Her family history includes Arrhythmia in her brother; Arthritis/Rheumatoid in her brother and sister; Colon polyps in her sister and sister; Diabetes in her mother, sister, and sister; Heart attack in her brother and mother; Heart disease in her brother, father, mother, and sister; Heart failure in her brother and sister; Stroke in her father, mother, and sister; Vision loss in her brother.She reports that she has been smoking cigarettes. She has a 8.25 pack-year smoking history. She has never used smokeless tobacco. She reports that she does not drink alcohol and does not use drugs.  Current Outpatient Medications on File Prior to Visit  Medication Sig Dispense Refill   albuterol (VENTOLIN HFA) 108 (90 Base) MCG/ACT inhaler USE 2 INHALATIONS EVERY 6 HOURS AS NEEDED FOR WHEEZING OR SHORTNESS OF BREATH, NEED FOR CHRONIC ASTHMA. 25.5 g 0   aspirin 81 MG tablet Take 1 tablet (81 mg total) by mouth daily. Resume 4 days post-op 30 tablet    atorvastatin (LIPITOR) 80 MG tablet Take 1 tablet (80 mg total) by mouth daily. 90 tablet 2   celecoxib (CELEBREX) 200 MG capsule TAKE 1 CAPSULE EVERY 12 HOURS 180 capsule 3   cetirizine (ZYRTEC) 10 MG tablet Take 1 tablet (10 mg total) by mouth daily. 90 tablet 3   cholecalciferol (VITAMIN D) 1000 UNITS tablet Take 1,000 Units by mouth daily.     cloNIDine (CATAPRES) 0.1 MG tablet Take 1 tablet (0.1 mg total) by mouth 2 (two) times daily. 180 tablet 3   diclofenac Sodium (VOLTAREN) 1 % GEL Apply 2 g topically 4 (four) times daily. 100 g 2   docusate sodium (COLACE) 100 MG capsule Take 1 capsule (100 mg total) by mouth 2 (two) times daily. To soften stool 180 capsule 3   empagliflozin (JARDIANCE) 25 MG TABS tablet Take  1 tablet (25 mg total) by mouth daily before breakfast. 90 tablet 3   Estradiol (YUVAFEM) 10 MCG TABS vaginal tablet INSERT 1 TABLET VAGINALLY TWICE A WEEK 24 tablet  0   Fluticasone-Salmeterol (ADVAIR DISKUS) 250-50 MCG/DOSE AEPB USE 1 INHALATION EVERY 12 HOURS 180 each 3   glucose blood (FREESTYLE LITE) test strip USE TO TEST DAILY 100 strip 11   MAGNESIUM PO Take by mouth.     omeprazole (PRILOSEC) 40 MG capsule TAKE 1 CAPSULE DAILY 90 capsule 3   telmisartan-hydrochlorothiazide (MICARDIS HCT) 80-25 MG tablet TAKE 1 TABLET EVERY MORNING 90 tablet 0   Current Facility-Administered Medications on File Prior to Visit  Medication Dose Route Frequency Provider Last Rate Last Admin   magnesium citrate solution 1 Bottle  1 Bottle Oral Once Carolan Clines, MD       sodium phosphate (FLEET) 7-19 GM/118ML enema 1 enema  1 enema Rectal Once Carolan Clines, MD        ROS Review of Systems  Constitutional: Negative.   HENT:  Negative for congestion.   Eyes:  Negative for visual disturbance.  Respiratory:  Negative for shortness of breath.   Cardiovascular:  Negative for chest pain.  Gastrointestinal:  Negative for abdominal pain, constipation, diarrhea, nausea and vomiting.  Genitourinary:  Negative for difficulty urinating.  Musculoskeletal:  Positive for arthralgias (left knee). Negative for myalgias.  Neurological:  Negative for headaches.  Psychiatric/Behavioral:  Negative for sleep disturbance.     Objective:  BP 108/62   Pulse 92   Temp 97.6 F (36.4 C)   Ht _0  (1.626 m)   Wt 179 lb 6.4 oz (81.4 kg)   LMP 04/14/1982   SpO2 94%   BMI 30.79 kg/m   BP Readings from Last 3 Encounters:  12/26/21 108/62  09/20/21 (!) 98/58  08/15/21 (!) 98/55    Wt Readings from Last 3 Encounters:  12/26/21 179 lb 6.4 oz (81.4 kg)  09/20/21 179 lb 3.2 oz (81.3 kg)  08/15/21 175 lb (79.4 kg)     Physical Exam Constitutional:      General: She is not in acute distress.    Appearance: She is well-developed.  Cardiovascular:     Rate and Rhythm: Normal rate and regular rhythm.  Pulmonary:     Breath sounds: Normal breath sounds.   Musculoskeletal:        General: Deformity (valgus deformity at left knee with standing.) present. Normal range of motion.  Skin:    General: Skin is warm and dry.  Neurological:     Mental Status: She is alert and oriented to person, place, and time.      Lab Results  Component Value Date   HGBA1C 6.3 (H) 09/20/2021   HGBA1C 6.2 (H) 06/21/2021   HGBA1C 6.0 (H) 03/21/2021    Assessment & Plan:   Rita Payne was seen today for medical management of chronic issues.  Diagnoses and all orders for this visit:  Type 2 diabetes mellitus with diabetic mononeuropathy, without long-term current use of insulin (HCC) -     Bayer DCA Hb A1c Waived -     glimepiride (AMARYL) 4 MG tablet; Take 1 tablet (4 mg total) by mouth daily with breakfast.  Hyperlipidemia associated with type 2 diabetes mellitus (HCC) -     Lipid panel -     fenofibrate (TRICOR) 145 MG tablet; Take 1 tablet (145 mg total) by mouth daily.  Essential hypertension -     CBC with Differential/Platelet -  CMP14+EGFR  Type 2 diabetes mellitus without complication, without long-term current use of insulin (HCC)  Other orders -     Dulaglutide (TRULICITY) 1.5 OE/4.2PN SOPN; Inject content of one pen under the skin weekly   I have discontinued Rita Payne "Sandy"'s Trulicity. I am also having her start on Trulicity. Additionally, I am having her maintain her cholecalciferol, aspirin, Fluticasone-Salmeterol, empagliflozin, docusate sodium, FREESTYLE LITE, omeprazole, diclofenac Sodium, MAGNESIUM PO, cloNIDine, cetirizine, celecoxib, atorvastatin, Yuvafem, albuterol, Micardis HCT, fenofibrate, and glimepiride.  Meds ordered this encounter  Medications   fenofibrate (TRICOR) 145 MG tablet    Sig: Take 1 tablet (145 mg total) by mouth daily.    Dispense:  90 tablet    Refill:  1   glimepiride (AMARYL) 4 MG tablet    Sig: Take 1 tablet (4 mg total) by mouth daily with breakfast.    Dispense:  90 tablet    Refill:  3    Dulaglutide (TRULICITY) 1.5 TI/1.4ER SOPN    Sig: Inject content of one pen under the skin weekly    Dispense:  6 mL    Refill:  1     Follow-up: No follow-ups on file.  Claretta Fraise, M.D.

## 2021-12-27 LAB — LIPID PANEL
Chol/HDL Ratio: 4.5 ratio — ABNORMAL HIGH (ref 0.0–4.4)
Cholesterol, Total: 145 mg/dL (ref 100–199)
HDL: 32 mg/dL — ABNORMAL LOW (ref >39)
LDL Chol Calc (NIH): 51 mg/dL (ref 0–99)
Triglycerides: 411 mg/dL — ABNORMAL HIGH (ref 0–149)
VLDL Cholesterol Cal: 62 mg/dL — ABNORMAL HIGH (ref 5–40)

## 2021-12-27 LAB — CBC WITH DIFFERENTIAL/PLATELET
Basophils Absolute: 0.1 10*3/uL (ref 0.0–0.2)
Basos: 1 %
EOS (ABSOLUTE): 0.2 10*3/uL (ref 0.0–0.4)
Eos: 2 %
Hematocrit: 44.7 % (ref 34.0–46.6)
Hemoglobin: 15.4 g/dL (ref 11.1–15.9)
Immature Grans (Abs): 0 10*3/uL (ref 0.0–0.1)
Immature Granulocytes: 0 %
Lymphocytes Absolute: 2.1 10*3/uL (ref 0.7–3.1)
Lymphs: 22 %
MCH: 31.6 pg (ref 26.6–33.0)
MCHC: 34.5 g/dL (ref 31.5–35.7)
MCV: 92 fL (ref 79–97)
Monocytes Absolute: 0.8 10*3/uL (ref 0.1–0.9)
Monocytes: 9 %
Neutrophils Absolute: 6.1 10*3/uL (ref 1.4–7.0)
Neutrophils: 66 %
Platelets: 179 10*3/uL (ref 150–450)
RBC: 4.88 x10E6/uL (ref 3.77–5.28)
RDW: 14.3 % (ref 11.7–15.4)
WBC: 9.3 10*3/uL (ref 3.4–10.8)

## 2021-12-27 LAB — CMP14+EGFR
ALT: 24 IU/L (ref 0–32)
AST: 26 IU/L (ref 0–40)
Albumin/Globulin Ratio: 1.8 (ref 1.2–2.2)
Albumin: 4.4 g/dL (ref 3.8–4.8)
Alkaline Phosphatase: 74 IU/L (ref 44–121)
BUN/Creatinine Ratio: 20 (ref 12–28)
BUN: 18 mg/dL (ref 8–27)
Bilirubin Total: 0.6 mg/dL (ref 0.0–1.2)
CO2: 24 mmol/L (ref 20–29)
Calcium: 10.1 mg/dL (ref 8.7–10.3)
Chloride: 101 mmol/L (ref 96–106)
Creatinine, Ser: 0.89 mg/dL (ref 0.57–1.00)
Globulin, Total: 2.5 g/dL (ref 1.5–4.5)
Glucose: 138 mg/dL — ABNORMAL HIGH (ref 70–99)
Potassium: 4 mmol/L (ref 3.5–5.2)
Sodium: 141 mmol/L (ref 134–144)
Total Protein: 6.9 g/dL (ref 6.0–8.5)
eGFR: 68 mL/min/{1.73_m2} (ref >59)

## 2021-12-27 LAB — BAYER DCA HB A1C WAIVED: HB A1C (BAYER DCA - WAIVED): 7 % — ABNORMAL HIGH (ref 4.8–5.6)

## 2022-01-10 DIAGNOSIS — M25562 Pain in left knee: Secondary | ICD-10-CM | POA: Diagnosis not present

## 2022-01-10 DIAGNOSIS — M1712 Unilateral primary osteoarthritis, left knee: Secondary | ICD-10-CM | POA: Diagnosis not present

## 2022-01-29 DIAGNOSIS — Z85828 Personal history of other malignant neoplasm of skin: Secondary | ICD-10-CM | POA: Diagnosis not present

## 2022-01-29 DIAGNOSIS — D239 Other benign neoplasm of skin, unspecified: Secondary | ICD-10-CM | POA: Diagnosis not present

## 2022-01-29 DIAGNOSIS — D485 Neoplasm of uncertain behavior of skin: Secondary | ICD-10-CM | POA: Diagnosis not present

## 2022-01-29 DIAGNOSIS — C44219 Basal cell carcinoma of skin of left ear and external auricular canal: Secondary | ICD-10-CM | POA: Diagnosis not present

## 2022-01-29 DIAGNOSIS — Z1283 Encounter for screening for malignant neoplasm of skin: Secondary | ICD-10-CM | POA: Diagnosis not present

## 2022-01-29 DIAGNOSIS — L570 Actinic keratosis: Secondary | ICD-10-CM | POA: Diagnosis not present

## 2022-02-07 ENCOUNTER — Other Ambulatory Visit: Payer: Self-pay | Admitting: Family Medicine

## 2022-02-07 DIAGNOSIS — Z780 Asymptomatic menopausal state: Secondary | ICD-10-CM

## 2022-02-11 ENCOUNTER — Other Ambulatory Visit: Payer: Self-pay | Admitting: Family Medicine

## 2022-02-11 DIAGNOSIS — J4520 Mild intermittent asthma, uncomplicated: Secondary | ICD-10-CM

## 2022-02-21 DIAGNOSIS — C44219 Basal cell carcinoma of skin of left ear and external auricular canal: Secondary | ICD-10-CM | POA: Diagnosis not present

## 2022-03-11 ENCOUNTER — Other Ambulatory Visit: Payer: Self-pay | Admitting: Family Medicine

## 2022-03-11 DIAGNOSIS — I10 Essential (primary) hypertension: Secondary | ICD-10-CM

## 2022-03-12 NOTE — H&P (Signed)
TOTAL KNEE ADMISSION H&P  Patient is being admitted for left total knee arthroplasty.  Subjective:  Chief Complaint: Left knee pain.  HPI: Rita Payne, 77 y.o. female has a history of pain and functional disability in the left knee due to arthritis and has failed non-surgical conservative treatments for greater than 12 weeks to include NSAID's and/or analgesics, corticosteriod injections, viscosupplementation injections, and activity modification. Onset of symptoms was gradual, starting several years ago with gradually worsening course since that time. The patient noted no past surgery on the left knee.  Patient currently rates pain in the left knee at 8 out of 10 with activity. Patient has night pain, worsening of pain with activity and weight bearing, and pain that interferes with activities of daily living. Patient has evidence of  bone-on-bone arthritis in the medial and patellofemoral compartments with significant varus deformity  by imaging studies. There is no active infection.  Patient Active Problem List   Diagnosis Date Noted   Obesity (BMI 30.0-34.9) 11/30/2018   Post-menopausal 11/30/2018   Osteoarthritis of knee 04/13/2017   Vitamin D deficiency 04/23/2016   Gastroesophageal reflux disease without esophagitis 03/23/2015   Smoker 02/17/2014   BMI 33.0-33.9,adult 02/17/2014   Spinal stenosis of lumbar region at multiple levels 12/08/2013   Senile osteoporosis 06/23/2013   S/P right THA, AA 08/25/2012   Diabetes ([place]) 03/12/2012   Asthma, chronic 03/12/2012   Hypertension associated with type 2 diabetes mellitus ([place]) 03/12/2012   Hyperlipidemia associated with type 2 diabetes mellitus ([place]) 03/12/2012   Arthritis 03/12/2012    Past Medical History:  Diagnosis Date   Allergy    Arthritis    Basal cell carcinoma    Cataract    Diarrhea    First degree heart block    GERD (gastroesophageal reflux disease)    History of kidney stones    Hyperlipidemia    Hypertension     Leg fracture    Melanoma (HCC)    Mild intermittent asthma    Neuromuscular disorder (HCC)    neuropathy feet - mild   SUI (stress urinary incontinence, female)    Type 2 diabetes mellitus ([place])     Past Surgical History:  Procedure Laterality Date   ABDOMINAL HYSTERECTOMY  1987   BELPHAROPTOSIS REPAIR Bilateral 2009   9    CHOLECYSTECTOMY  1989   COLONOSCOPY     HERNIA REPAIR     kidney tumor removal   1986   KNEE ARTHROSCOPY Bilateral    LUMBAR LAMINECTOMY/DECOMPRESSION MICRODISCECTOMY N/A 12/08/2013   Procedure: LUMBAR DECOMPRESSION L4-L5,  L3-L4 ;  Surgeon: Johnn Hai, MD;  Location: [facility];  Service: Orthopedics;  Laterality: N/A;   POLYPECTOMY     PUBOVAGINAL SLING N/A 10/03/2014   Procedure: Gaynelle Arabian;  Surgeon: Carolan Clines, MD;  [facility];  Service: Urology;  Laterality: N/A;   REPLACEMENT TOTAL HIP W/  RESURFACING IMPLANTS Right    TONSILLECTOMY     TOTAL HIP ARTHROPLASTY Right 08/25/2012   Procedure: RIGHT TOTAL HIP ARTHROPLASTY ANTERIOR APPROACH;  Surgeon: [name], MD;  Location: [facility];  Service: Orthopedics;  Laterality: Right;    Prior to Admission medications   Medication Sig Start Date End Date Taking? Authorizing Provider  albuterol (VENTOLIN HFA) 108 (90 Base) MCG/ACT inhaler USE 2 INHALATIONS EVERY 6 HOURS AS NEEDED FOR WHEEZING OR SHORTNESS OF BREATH, NEED FOR CHRONIC ASTHMA. 02/11/22   Claretta Fraise, MD  aspirin 81 MG tablet Take 1 tablet (81 mg total)  by mouth daily. Resume 4 days post-op 12/09/13   Lacie Draft M, PA-C  atorvastatin (LIPITOR) 80 MG tablet Take 1 tablet (80 mg total) by mouth daily. 09/21/21   Claretta Fraise, MD  celecoxib (CELEBREX) 200 MG capsule TAKE 1 CAPSULE EVERY 12 HOURS 09/21/21   Claretta Fraise, MD  cetirizine (ZYRTEC) 10 MG tablet Take 1 tablet (10 mg total) by mouth daily. 09/21/21   Claretta Fraise, MD  cholecalciferol (VITAMIN D) 1000 UNITS tablet Take 1,000 Units by mouth  daily.    [provider]  cloNIDine (CATAPRES) 0.1 MG tablet Take 1 tablet (0.1 mg total) by mouth 2 (two) times daily. 09/21/21   Claretta Fraise, MD  diclofenac Sodium (VOLTAREN) 1 % GEL Apply 2 g topically 4 (four) times daily. 06/21/21   Claretta Fraise, MD  docusate sodium (COLACE) 100 MG capsule Take 1 capsule (100 mg total) by mouth 2 (two) times daily. To soften stool 12/19/20   Claretta Fraise, MD  Dulaglutide (TRULICITY) 1.5 0000000 SOPN Inject content of one pen under the skin weekly 12/26/21   Claretta Fraise, MD  empagliflozin (JARDIANCE) 25 MG TABS tablet Take 1 tablet (25 mg total) by mouth daily before breakfast. 09/21/20   Claretta Fraise, MD  Estradiol (YUVAFEM) 10 MCG TABS vaginal tablet INSERT 1 TABLET VAGINALLY TWICE A WEEK 02/07/22   Claretta Fraise, MD  fenofibrate (TRICOR) 145 MG tablet Take 1 tablet (145 mg total) by mouth daily. 12/26/21   Claretta Fraise, MD  Fluticasone-Salmeterol (ADVAIR DISKUS) 250-50 MCG/DOSE AEPB USE 1 INHALATION EVERY 12 HOURS 03/08/20   Claretta Fraise, MD  glimepiride (AMARYL) 4 MG tablet Take 1 tablet (4 mg total) by mouth daily with breakfast. 12/26/21   Claretta Fraise, MD  glucose blood (FREESTYLE LITE) test strip USE TO TEST DAILY 01/30/21   Claretta Fraise, MD  MAGNESIUM PO Take by mouth.    [provider]  omeprazole (PRILOSEC) 40 MG capsule TAKE 1 CAPSULE DAILY 04/23/21   Claretta Fraise, MD  telmisartan-hydrochlorothiazide (MICARDIS HCT) 80-25 MG tablet TAKE 1 TABLET EVERY MORNING 03/11/22   Claretta Fraise, MD    Allergies  Allergen Reactions   Ivp Dye [Iodinated Contrast Media] Anaphylaxis   Shellfish Allergy Anaphylaxis   Vasotec [Enalapril] Shortness Of Breath   Adhesive [Tape] Other (See Comments)    blisters   Amlodipine     swelling   Gabapentin Other (See Comments)    drowsiness   Iodine Other (See Comments)    Iodine that is applied to skin causes blisters    Pyridium [Phenazopyridine Hcl]     blisters    Social  History   Socioeconomic History   Marital status: Widowed    Spouse name: Not on file   Number of children: 2   Years of education: some college   Highest education level: Some college, no degree  Occupational History   Occupation: Radio broadcast assistant     Comment: 3 days per wk/ [employer]; 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 Son in [place], daughter in [place]. 2 sisters live nearby   Social Determinants of Health   Financial Resource Strain: Low Risk  (06/22/2021)   Overall Financial Resource Strain (CARDIA)    Difficulty of Paying Living Expenses: Not hard at all  Food Insecurity: No Food Insecurity (06/22/2021)   Hunger Vital Sign    Worried About Running Out of Food in the Last Year: Never true    Ran Out of Food in the Last Year: Never true  Transportation Needs: No Transportation Needs (06/22/2021)   PRAPARE - Hydrologist (Medical): No    Lack of Transportation (Non-Medical): No  Physical Activity: Insufficiently Active (06/22/2021)   Exercise Vital Sign    Days of Exercise per Week: 7 days    Minutes of Exercise per Session: 10 min  Stress: No Stress Concern Present (06/22/2021)   [place]    Feeling of Stress : Only a little  Social Connections: Socially Isolated (06/22/2021)   Social Connection and Isolation Panel [NHANES]    Frequency of Communication with Friends and Family: More than three times a week    Frequency of Social Gatherings with  Friends and Family: More than three times a week    Attends Religious Services: Never    Marine scientist or Organizations: No    Attends Archivist Meetings: Never    Marital Status: Widowed  Intimate Partner Violence: Not At Risk (06/22/2021)   Humiliation, Afraid, Rape, and Kick questionnaire    Fear of Current or Ex-Partner: No    Emotionally Abused: No    Physically Abused: No    Sexually Abused: No    Tobacco Use: High Risk (12/26/2021)   Patient History    Smoking Tobacco Use: Every Day    Smokeless Tobacco Use: Never    Passive Exposure: Not on file   Social History   Substance and Sexual Activity  Alcohol Use No    Family History  Problem Relation Age of Onset   Diabetes Mother    Heart disease Mother    Stroke Mother    Heart attack Mother    Heart disease Father    Stroke Father    Diabetes Sister    Heart disease Sister    Heart failure Sister    Diabetes Sister    Stroke Sister    Colon polyps Sister    Arthritis/Rheumatoid Sister    Colon polyps Sister    Vision loss Brother    Arthritis/Rheumatoid Brother    Heart disease Brother    Heart failure Brother    Heart attack Brother    Arrhythmia Brother    Colon cancer Neg Hx    Esophageal cancer Neg Hx    Stomach cancer Neg Hx    Rectal cancer Neg Hx    Breast cancer Neg Hx     Review of Systems  Constitutional:  Negative for chills and fever.  HENT: Negative.    Eyes: Negative.   Respiratory:  Negative for cough and shortness of breath.   Cardiovascular:  Negative for chest pain and palpitations.  Gastrointestinal:  Negative for abdominal pain, nausea and vomiting.  Genitourinary:  Negative for frequency, hematuria and urgency.  Musculoskeletal:  Positive for joint pain.  Skin:  Negative for rash.   Objective:  Physical Exam: Well nourished and well developed.  General: Alert and oriented x3, cooperative  and pleasant, no acute distress.  Head: normocephalic, atraumatic,  neck supple.  Eyes: EOMI. Abdomen: non-tender to palpation and soft, normoactive bowel sounds. Musculoskeletal: Left Hip Exam:  Range of motion: Normal without discomfort.   Left Knee Exam:  Varus deformity.  No effusion present. No swelling present.  Range of motion: 0 to 145 degrees.  Positive crepitus on range of motion of the knee.  Positive medial joint line tenderness.  No lateral joint line tenderness.  The knee is unstable.  Calves soft and nontender. Motor function intact in LE. Strength 5/5 LE bilaterally. Neuro: Distal pulses 2+. Sensation to light touch intact in LE.  Vital signs in last 24 hours: BP: ()/()  Arterial Line BP: ()/()   Imaging Review Plain radiographs demonstrate moderate degenerative joint disease of the left knee. The overall alignment is significant varus. The bone quality appears to be adequate for age and reported activity level.  Assessment/Plan:  End stage arthritis, left knee   The patient history, physical examination, clinical judgment of the provider and imaging studies are consistent with end stage degenerative joint disease of the left knee and total knee arthroplasty is deemed medically necessary. The treatment options including medical management, injection therapy arthroscopy and arthroplasty were discussed at length. The risks and benefits of total knee arthroplasty were presented and reviewed. The risks due to aseptic loosening, infection, stiffness, patella tracking problems, thromboembolic complications and other imponderables were discussed. The patient acknowledged the explanation, agreed to proceed with the plan and consent was signed. Patient is being admitted for inpatient treatment for surgery, pain control, PT, OT, prophylactic antibiotics, VTE prophylaxis, progressive ambulation and ADLs and discharge planning. The patient is planning to be discharged  home .  Patient's anticipated LOS is less than 2 midnights, meeting these  requirements: - Lives within 1 hour of care - Has a competent adult at home to recover with post-op - NO history of  - Chronic pain requiring opioids  - Coronary Artery Disease  - Heart failure  - Heart attack  - Stroke  - DVT/VTE  - Cardiac arrhythmia  - Respiratory Failure/COPD  - Renal failure  - Anemia  - Advanced Liver disease  Therapy Plans: Protherapy Concepts Disposition: Home with Daughter Planned DVT Prophylaxis: Aspirin 81 mg BID DME Needed: None PCP: Claretta Fraise, MD (appt 3/26) TXA: IV Allergies: pyridium (hives), iodine (rash), vasotec (anaphylaxis), adhesive tape (hives) Anesthesia Concerns: None BMI: 30.3 Last HgbA1c: 6.9% - will recheck with pre-op labs  Pharmacy: [facility] ([place], [place])  Other: -Given clearance form to take to PCP - appt 3/26. Notes will be in EPIC too. -Will need nicotine patch during hospital stay.  - Patient was instructed on what medications to stop prior to surgery. - Follow-up visit in 2 weeks with Dr. Wynelle Link - Begin physical therapy following surgery - Pre-operative lab work as pre-surgical testing - Prescriptions will be provided in hospital at time of discharge  R. Jaynie Bream, PA-C Orthopedic Surgery [facility] [place]

## 2022-03-13 ENCOUNTER — Other Ambulatory Visit: Payer: Self-pay | Admitting: Family Medicine

## 2022-03-13 DIAGNOSIS — E119 Type 2 diabetes mellitus without complications: Secondary | ICD-10-CM

## 2022-03-22 DIAGNOSIS — M25562 Pain in left knee: Secondary | ICD-10-CM | POA: Diagnosis not present

## 2022-03-27 ENCOUNTER — Other Ambulatory Visit: Payer: Self-pay | Admitting: Family Medicine

## 2022-03-27 DIAGNOSIS — M546 Pain in thoracic spine: Secondary | ICD-10-CM

## 2022-03-28 NOTE — Patient Instructions (Addendum)
SURGICAL WAITING ROOM VISITATION Patients having surgery or a procedure may have no more than 2 support people in the waiting area - these visitors may rotate in the visitor waiting room.   Due to an increase in RSV and influenza rates and associated hospitalizations, children ages 25 and under may not visit patients in [place]. If the patient needs to stay at the hospital during part of their recovery, the visitor guidelines for inpatient rooms apply.  PRE-OP VISITATION  Pre-op nurse will coordinate an appropriate time for 1 support person to accompany the patient in pre-op.  This support person may not rotate.  This visitor will be contacted when the time is appropriate for the visitor to come back in the pre-op area.  Please refer to the [facility] website for the visitor guidelines for Inpatients (after your surgery is over and you are in a regular room).  You are not required to quarantine at this time prior to your surgery. However, you must do this: Hand Hygiene often Do NOT share personal items Notify your provider if you are in close contact with someone who has COVID or you develop fever 100.4 or greater, new onset of sneezing, cough, sore throat, shortness of breath or body aches.  If you test positive for Covid or have been in contact with anyone that has tested positive in the last 10 days please notify you surgeon.    Your procedure is scheduled on:  Monday  April 08, 2022  Report to [facility] Main Entrance: [address] entrance where the [employer] is available.   Report to admitting at:05:15   AM  +++++Call this number if you have any questions or problems the morning of surgery [phone]  Do not eat food after Midnight the night prior to your surgery/procedure.  After Midnight you may have the following liquids until  04:15  AM DAY OF SURGERY  Clear Liquid Diet Water Black Coffee (sugar ok, NO MILK/CREAM OR CREAMERS)  Tea (sugar ok, NO  MILK/CREAM OR CREAMERS) regular and decaf                             Plain Jell-O  with no fruit (NO RED)                                           Fruit ices (not with fruit pulp, NO RED)                                     Popsicles (NO RED)                                                                  Juice: apple, WHITE grape, WHITE cranberry Sports drinks like Gatorade or Powerade (NO RED)                    The day of surgery:  Drink ONE (1) Pre-Surgery G2 at  04:15 AM the morning of surgery. Drink in one sitting. Do not  sip.  This drink was given to you during your hospital pre-op appointment visit. Nothing else to drink after completing the Pre-Surgery G2 : No candy, chewing gum or throat lozenges.    FOLLOW ANY ADDITIONAL PRE OP INSTRUCTIONS YOU RECEIVED FROM YOUR SURGEON'S OFFICE!!!   Oral Hygiene is also important to reduce your risk of infection.        Remember - BRUSH YOUR TEETH THE MORNING OF SURGERY WITH YOUR REGULAR TOOTHPASTE  Diabetic Medications:   Trulicity on Sundays, Last dose: 03-31-22  Jardiance : Hold 72 hours,  Last dose: Thursday 04-04-22 Glimepirde: Okay to take the day before in the morning. Day of Surgery: don't take   Do NOT smoke after Midnight the night before surgery.  Take ONLY these medicines the morning of surgery with A SIP OF WATER: omeprazole (Prilosec), Clonidine, Cetirizine (Zyrtec). You may use your Albuterol inhaler and Advair Diskus if needed.                  You may not have any metal on your body including hair pins, jewelry, and body piercing  Do not wear make-up, lotions, powders, perfumes or deodorant  Do not wear nail polish including gel and S&S, artificial / acrylic nails, or any other type of covering on natural nails including finger and toenails. If you have artificial nails, gel coating, etc., that needs to be removed by a nail salon, Please have this removed prior to surgery. Not doing so may mean that your surgery  could be cancelled or delayed if the Surgeon or anesthesia staff feels like they are unable to monitor you safely.   Do not shave 48 hours prior to surgery to avoid nicks in your skin which may contribute to postoperative infections.   Contacts, Hearing Aids, dentures or bridgework may not be worn into surgery. DENTURES WILL BE REMOVED PRIOR TO SURGERY PLEASE DO NOT APPLY "Poly grip" OR ADHESIVES!!!  You may bring a small overnight bag with you on the day of surgery, only pack items that are not valuable. [facility] IS NOT RESPONSIBLE   FOR VALUABLES THAT ARE LOST OR STOLEN.   Do not bring your home medications to the hospital. The Pharmacy will dispense medications listed on your medication list to you during your admission in the Hospital.  Special Instructions: Bring a copy of your healthcare power of attorney and living will documents the day of surgery, if you wish to have them scanned into your [facility] Records- EPIC  Please read over the following fact sheets you were given: IF YOU HAVE QUESTIONS ABOUT YOUR [place], [place] [phone].   [facility] - Preparing for Surgery Before surgery, you can play an important role.  Because skin is not sterile, your skin needs to be as free of germs as possible.  You can reduce the number of germs on your skin by washing with CHG (chlorahexidine gluconate) soap before surgery.  CHG is an antiseptic cleaner which kills germs and bonds with the skin to continue killing germs even after washing. Please DO NOT use if you have an allergy to CHG or antibacterial soaps.  If your skin becomes reddened/irritated stop using the CHG and inform your nurse when you arrive at Short Stay. Do not shave (including legs and underarms) for at least 48 hours prior to the first CHG shower.  You may shave your face/neck.  Please follow these instructions carefully:  1.  Shower with CHG Soap the night before surgery  and the  morning of  surgery.  2.  If you choose to wash your hair, wash your hair first as usual with your normal  shampoo.  3.  After you shampoo, rinse your hair and body thoroughly to remove the shampoo.                             4.  Use CHG as you would any other liquid soap.  You can apply chg directly to the skin and wash.  Gently with a scrungie or clean washcloth.  5.  Apply the CHG Soap to your body ONLY FROM THE NECK DOWN.   Do not use on face/ open                           Wound or open sores. Avoid contact with eyes, ears mouth and genitals (private parts).                       Wash face,  Genitals (private parts) with your normal soap.             6.  Wash thoroughly, paying special attention to the area where your  surgery  will be performed.  7.  Thoroughly rinse your body with warm water from the neck down.  8.  DO NOT shower/wash with your normal soap after using and rinsing off the CHG Soap.            9.  Pat yourself dry with a clean towel.            10.  Wear clean pajamas.            11.  Place clean sheets on your bed the night of your first shower and do not  sleep with pets.  ON THE DAY OF SURGERY : Do not apply any lotions/deodorants the morning of surgery.  Please wear clean clothes to the hospital/surgery center.    FAILURE TO FOLLOW THESE INSTRUCTIONS MAY RESULT IN THE CANCELLATION OF YOUR SURGERY  PATIENT SIGNATURE_________________________________  NURSE SIGNATURE__________________________________  ________________________________________________________________________        Rita Payne    An incentive spirometer is a tool that can help keep your lungs clear and active. This tool measures how well you are filling your lungs with each breath. Taking long deep breaths may help reverse or decrease the chance of developing breathing (pulmonary) problems (especially infection) following: A long period of time when you are unable to move or be active. BEFORE  THE PROCEDURE  If the spirometer includes an indicator to show your best effort, your nurse or respiratory therapist will set it to a desired goal. If possible, sit up straight or lean slightly forward. Try not to slouch. Hold the incentive spirometer in an upright position. INSTRUCTIONS FOR USE  Sit on the edge of your bed if possible, or sit up as far as you can in bed or on a chair. Hold the incentive spirometer in an upright position. Breathe out normally. Place the mouthpiece in your mouth and seal your lips tightly around it. Breathe in slowly and as deeply as possible, raising the piston or the ball toward the top of the column. Hold your breath for 3-5 seconds or for as long as possible. Allow the piston or ball to fall to the bottom of the column. Remove the mouthpiece from your mouth  and breathe out normally. Rest for a few seconds and repeat Steps 1 through 7 at least 10 times every 1-2 hours when you are awake. Take your time and take a few normal breaths between deep breaths. The spirometer may include an indicator to show your best effort. Use the indicator as a goal to work toward during each repetition. After each set of 10 deep breaths, practice coughing to be sure your lungs are clear. If you have an incision (the cut made at the time of surgery), support your incision when coughing by placing a pillow or rolled up towels firmly against it. Once you are able to get out of bed, walk around indoors and cough well. You may stop using the incentive spirometer when instructed by your caregiver.  RISKS AND COMPLICATIONS Take your time so you do not get dizzy or light-headed. If you are in pain, you may need to take or ask for pain medication before doing incentive spirometry. It is harder to take a deep breath if you are having pain. AFTER USE Rest and breathe slowly and easily. It can be helpful to keep track of a log of your progress. Your caregiver can provide you with a simple  table to help with this. If you are using the spirometer at home, follow these instructions: [place] IF:  You are having difficultly using the spirometer. You have trouble using the spirometer as often as instructed. Your pain medication is not giving enough relief while using the spirometer. You develop fever of 100.5 F (38.1 C) or higher.                                                                                                    SEEK IMMEDIATE MEDICAL CARE IF:  You cough up bloody sputum that had not been present before. You develop fever of 102 F (38.9 C) or greater. You develop worsening pain at or near the incision site. MAKE SURE YOU:  Understand these instructions. Will watch your condition. Will get help right away if you are not doing well or get worse. Document Released: 05/06/2006 Document Revised: 03/18/2011 Document Reviewed: 07/07/2006 [facility] Patient Information 2014 [place], [place].

## 2022-03-28 NOTE — Progress Notes (Addendum)
COVID Vaccine received:  []  No [x]  Yes Date of any COVID positive Test in last 90 days:  None  PCP - Claretta Fraise, MD Cardiologist - none  Chest x-ray - 2018  2v  Epic EKG - 09-20-2021  Epic  Stress Test -  ECHO -  Cardiac Cath -   PCR screen: [x]  Ordered & Completed                      []   No Order but Needs PROFEND                      []   N/A for this surgery  Surgery Plan:  []  Ambulatory                            [x]  Outpatient in bed                            []  Admit  Anesthesia:    []  General  []  Spinal                           [x]   Choice []   MAC  Pacemaker / ICD device [x]  No []  Yes   Spinal Cord Stimulator:[x]  No []  Yes     History of Sleep Apnea? []  No [x]  Yes  had sleep study but mild OSA per patient CPAP used?- [x]  No []  Yes    Does the patient monitor blood sugar? []  No [x]  Yes  []  N/A  Patient has: []  Pre-DM   []  DM1  [x]   DM2 Does patient have a [employer] or Dexacom? [x]  No []  Yes   Fasting Blood Sugar Ranges- 105-135 Checks Blood Sugar __1  times a day  GLP1 agonist-  Trulicity on Sundays, Last dose: 03-31-22 SGLT-2 inhibitors- Jardiance SGLT-2 instructions: Hold 72 hours,  Last dose: Thursday 04-04-22 Other Diabetic medications/ instructions: glimepirde: Okay to take the day before in the morning. DOS: don't take   Blood Thinner / Instructions:None Aspirin Instructions:  ASA 81 mg  stop 1 week prior to surgery  ERAS Protocol Ordered: []  No  [x]  Yes PRE-SURGERY []  ENSURE  [x]  G2  Patient is to be NPO after: 04:15 am  Comments: Patient still works full time as a Radio broadcast assistant.   Activity level: Patient is unable to climb a flight of stairs without difficulty; [x]  No CP   but would have SOB and leg pain.  Patient can perform ADLs without assistance.   Anesthesia review: DM2, smoker (hasn't smoked in 9 days), asthma, HTN, GERD, Hx 1 heart block- 09-20-2021 EKG, Palps,   Patient denies shortness of breath, fever, cough and chest pain at PAT  appointment.  Patient verbalized understanding and agreement to the Pre-Surgical Instructions that were given to them at this PAT appointment. Patient was also educated of the need to review these PAT instructions again prior to her surgery.I reviewed the appropriate phone numbers to call if they have any and questions or concerns.

## 2022-03-29 ENCOUNTER — Other Ambulatory Visit: Payer: Self-pay

## 2022-03-29 ENCOUNTER — Encounter (HOSPITAL_COMMUNITY)
Admission: RE | Admit: 2022-03-29 | Discharge: 2022-03-29 | Disposition: A | Discharge status: Home / Self Care | Payer: Medicare Other | Source: Ambulatory Visit | Attending: Orthopedic Surgery | Admitting: Orthopedic Surgery

## 2022-03-29 ENCOUNTER — Encounter (HOSPITAL_COMMUNITY): Payer: Self-pay

## 2022-03-29 VITALS — BP 129/59 | HR 87 | Temp 98.1°F | Resp 16 | Ht 64.5 in | Wt 176.0 lb

## 2022-03-29 DIAGNOSIS — E119 Type 2 diabetes mellitus without complications: Secondary | ICD-10-CM | POA: Diagnosis not present

## 2022-03-29 DIAGNOSIS — Z01812 Encounter for preprocedural laboratory examination: Secondary | ICD-10-CM | POA: Diagnosis not present

## 2022-03-29 DIAGNOSIS — I10 Essential (primary) hypertension: Secondary | ICD-10-CM | POA: Diagnosis not present

## 2022-03-29 DIAGNOSIS — Z01818 Encounter for other preprocedural examination: Secondary | ICD-10-CM

## 2022-03-29 HISTORY — DX: Chronic kidney disease, unspecified: N18.9

## 2022-03-29 LAB — BASIC METABOLIC PANEL
Anion gap: 10 (ref 5–15)
BUN: 24 mg/dL — ABNORMAL HIGH (ref 8–23)
CO2: 24 mmol/L (ref 22–32)
Calcium: 8.9 mg/dL (ref 8.9–10.3)
Chloride: 103 mmol/L (ref 98–111)
Creatinine, Ser: 0.93 mg/dL (ref 0.44–1.00)
GFR, Estimated: >60 mL/min (ref >60)
Glucose, Bld: 184 mg/dL — ABNORMAL HIGH (ref 70–99)
Potassium: 4 mmol/L (ref 3.5–5.1)
Sodium: 137 mmol/L (ref 135–145)

## 2022-03-29 LAB — CBC
HCT: 43.8 % (ref 36.0–46.0)
Hemoglobin: 14.5 g/dL (ref 12.0–15.0)
MCH: 31.6 pg (ref 26.0–34.0)
MCHC: 33.1 g/dL (ref 30.0–36.0)
MCV: 95.4 fL (ref 80.0–100.0)
Platelets: 212 10*3/uL (ref 150–400)
RBC: 4.59 MIL/uL (ref 3.87–5.11)
RDW: 14.3 % (ref 11.5–15.5)
WBC: 8.9 10*3/uL (ref 4.0–10.5)
nRBC: 0 % (ref 0.0–0.2)

## 2022-03-29 LAB — SURGICAL PCR SCREEN
MRSA, PCR: NEGATIVE
Staphylococcus aureus: NEGATIVE

## 2022-03-29 LAB — GLUCOSE, CAPILLARY: Glucose-Capillary: 215 mg/dL — ABNORMAL HIGH (ref 70–99)

## 2022-03-30 LAB — HEMOGLOBIN A1C
Hgb A1c MFr Bld: 6.6 % — ABNORMAL HIGH (ref 4.8–5.6)
Mean Plasma Glucose: 143 mg/dL

## 2022-04-02 ENCOUNTER — Ambulatory Visit (INDEPENDENT_AMBULATORY_CARE_PROVIDER_SITE_OTHER): Payer: Medicare Other | Admitting: Family Medicine

## 2022-04-02 ENCOUNTER — Encounter: Payer: Self-pay | Admitting: Family Medicine

## 2022-04-02 VITALS — BP 113/56 | HR 74 | Temp 97.7°F | Ht 64.5 in | Wt 176.6 lb

## 2022-04-02 DIAGNOSIS — I152 Hypertension secondary to endocrine disorders: Secondary | ICD-10-CM | POA: Diagnosis not present

## 2022-04-02 DIAGNOSIS — E1159 Type 2 diabetes mellitus with other circulatory complications: Secondary | ICD-10-CM | POA: Diagnosis not present

## 2022-04-02 DIAGNOSIS — M1712 Unilateral primary osteoarthritis, left knee: Secondary | ICD-10-CM | POA: Diagnosis not present

## 2022-04-02 DIAGNOSIS — M1990 Unspecified osteoarthritis, unspecified site: Secondary | ICD-10-CM

## 2022-04-02 DIAGNOSIS — M1710 Unilateral primary osteoarthritis, unspecified knee: Secondary | ICD-10-CM

## 2022-04-02 DIAGNOSIS — E1141 Type 2 diabetes mellitus with diabetic mononeuropathy: Secondary | ICD-10-CM | POA: Diagnosis not present

## 2022-04-02 MED ORDER — TRULICITY 1.5 MG/0.5ML ~~LOC~~ SOAJ: SUBCUTANEOUS | 5 refills | Status: DC

## 2022-04-02 NOTE — Progress Notes (Addendum)
Subjective:  Patient ID: Rita Payne, female    DOB: 09/05/1945  Age: 77 y.o. MRN: [id number]  CC: Hospitalization Follow-up and Medical Management of Chronic Issues   HPI [name] presents for presents forFollow-up of diabetes. Patient checks blood sugar at home.   110-130 fasting and not checking postprandial. A1c 6.6 on 3/22.  Patient denies symptoms such as polyuria, polydipsia, excessive hunger, nausea No significant hypoglycemic spells noted. Medications reviewed. Pt reports taking them regularly without complication/adverse reaction being reported today.   Surgery on April 1 left knee replacement with Dr. Maureen Ralphs. Already had blood work. Surgical clearance needed.  Last cigarette was 2 weeks ago.  Lab Results  Component Value Date   HGBA1C 6.6 (H) 03/29/2022   HGBA1C 7.0 (H) 12/26/2021   HGBA1C 6.3 (H) 09/20/2021         04/02/2022    8:09 AM 12/26/2021    8:17 AM 09/20/2021    8:03 AM  Depression screen PHQ 2/9  Decreased Interest 0 0 0  Down, Depressed, Hopeless 0 0 0  PHQ - 2 Score 0 0 0    History Chestina has a past medical history of Allergy, Arthritis, Basal cell carcinoma, Cataract, Chronic kidney disease, Diarrhea, First degree heart block, GERD (gastroesophageal reflux disease), Hyperlipidemia, Hypertension, Leg fracture, Melanoma, Mild intermittent asthma, Neuromuscular disorder, Pneumonia, SUI (stress urinary incontinence, female), and Type 2 diabetes mellitus.   She has a past surgical history that includes Tonsillectomy; Abdominal hysterectomy (1987); Cholecystectomy (1989); Knee arthroscopy (Bilateral); Hernia repair; kidney tumor removal  (1986); Blepharoptosis repair (Bilateral, 2009); Total hip arthroplasty (Right, 08/25/2012); Lumbar laminectomy/decompression microdiscectomy (N/A, 12/08/2013); Pubovaginal sling (N/A, 10/03/2014); Replacement total hip w/  resurfacing implants (Right); Colonoscopy; Polypectomy; and Total knee arthroplasty (Left,  04/08/2022).   Her family history includes Arrhythmia in her brother; Arthritis/Rheumatoid in her brother and sister; Colon polyps in her sister and sister; Diabetes in her mother, sister, and sister; Heart attack in her brother and mother; Heart disease in her brother, father, mother, and sister; Heart failure in her brother and sister; Stroke in her father, mother, and sister; Vision loss in her brother.She reports that she quit smoking about 3 weeks ago. Her smoking use included cigarettes. She has a 8.25 pack-year smoking history. She has never used smokeless tobacco. She reports that she does not drink alcohol and does not use drugs.    ROS Review of Systems  Constitutional: Negative.   HENT: Negative.    Eyes:  Negative for visual disturbance.  Respiratory:  Negative for shortness of breath.   Cardiovascular:  Negative for chest pain.  Gastrointestinal:  Negative for abdominal pain.  Musculoskeletal:  Negative for arthralgias.    Objective:  BP (!) 113/56   Pulse 74   Temp 97.7 F (36.5 C)   Ht 5' 4.5" (1.638 m)   Wt 176 lb 9.6 oz (80.1 kg)   LMP 04/14/1982   SpO2 93%   BMI 29.85 kg/m   BP Readings from Last 3 Encounters:  04/09/22 (!) 130/56  04/02/22 (!) 113/56  03/29/22 (!) 129/59    Wt Readings from Last 3 Encounters:  04/08/22 176 lb 9.6 oz (80.1 kg)  04/02/22 176 lb 9.6 oz (80.1 kg)  03/29/22 176 lb (79.8 kg)     Physical Exam Constitutional:      General: She is not in acute distress.    Appearance: She is well-developed.  Cardiovascular:     Rate and Rhythm: Normal rate and regular rhythm.  Pulmonary:  Breath sounds: Normal breath sounds.  Musculoskeletal:        General: Normal range of motion.  Skin:    General: Skin is warm and dry.  Neurological:     Mental Status: She is alert and oriented to person, place, and time.       Assessment & Plan:   Senora was seen today for hospitalization follow-up and medical management of chronic  issues.  Diagnoses and all orders for this visit:  Type 2 diabetes mellitus with diabetic mononeuropathy, without long-term current use of insulin (HCC)  Arthritis  Primary osteoarthritis of knee, unspecified laterality  Hypertension associated with type 2 diabetes mellitus ([place])  Other orders -     Dulaglutide (TRULICITY) 1.5 0000000 SOPN; Inject content of one pen under the skin weekly       I am having [name] "[place]" maintain her cholecalciferol, Fluticasone-Salmeterol, empagliflozin, docusate sodium, omeprazole, MAGNESIUM PO, cloNIDine, cetirizine, atorvastatin, fenofibrate, glimepiride, Yuvafem, albuterol, Micardis HCT, FREESTYLE LITE, and Trulicity.  Allergies as of 04/02/2022       Reactions   Ivp Dye [iodinated Contrast Media] Anaphylaxis   Shellfish Allergy Anaphylaxis   Vasotec [enalapril] Shortness Of Breath   Adhesive [tape] Other (See Comments)   blisters   Amlodipine Swelling   Gabapentin Other (See Comments)   drowsiness   Iodine Other (See Comments)   Iodine that is applied to skin causes blisters    Pyridium [phenazopyridine Hcl]    blisters        Medication List        Accurate as of April 02, 2022 11:59 PM. If you have any questions, ask your nurse or doctor.          albuterol 108 (90 Base) MCG/ACT inhaler Commonly known as: VENTOLIN HFA USE 2 INHALATIONS EVERY 6 HOURS AS NEEDED FOR WHEEZING OR SHORTNESS OF BREATH, NEED FOR CHRONIC ASTHMA.   aspirin 81 MG chewable tablet Chew 1 tablet (81 mg total) by mouth 2 (two) times daily for 21 days. Then resume one 81 mg aspirin once a day.   atorvastatin 80 MG tablet Commonly known as: LIPITOR Take 1 tablet (80 mg total) by mouth daily.   cetirizine 10 MG tablet Commonly known as: ZYRTEC Take 1 tablet (10 mg total) by mouth daily.   cholecalciferol 1000 units tablet Commonly known as: VITAMIN D Take 1,000 Units by mouth daily.   cloNIDine 0.1 MG tablet Commonly known as:  CATAPRES Take 1 tablet (0.1 mg total) by mouth 2 (two) times daily.   docusate sodium 100 MG capsule Commonly known as: Colace Take 1 capsule (100 mg total) by mouth 2 (two) times daily. To soften stool   empagliflozin 25 MG Tabs tablet Commonly known as: Jardiance Take 1 tablet (25 mg total) by mouth daily before breakfast.   fenofibrate 145 MG tablet Commonly known as: TRICOR Take 1 tablet (145 mg total) by mouth daily.   Fluticasone-Salmeterol 250-50 MCG/DOSE Aepb Commonly known as: Advair Diskus USE 1 INHALATION EVERY 12 HOURS   FREESTYLE LITE test strip Generic drug: glucose blood USE TO TEST DAILY   glimepiride 4 MG tablet Commonly known as: AMARYL Take 1 tablet (4 mg total) by mouth daily with breakfast.   MAGNESIUM PO Take 420 mg by mouth daily.   methocarbamol 500 MG tablet Commonly known as: ROBAXIN Take 1 tablet (500 mg total) by mouth every 6 (six) hours as needed for muscle spasms.   Micardis HCT 80-25 MG tablet Generic drug: telmisartan-hydrochlorothiazide TAKE  1 TABLET EVERY MORNING   omeprazole 40 MG capsule Commonly known as: PRILOSEC TAKE 1 CAPSULE DAILY   oxyCODONE 5 MG immediate release tablet Commonly known as: Oxy IR/ROXICODONE Take 1-2 tablets (5-10 mg total) by mouth every 6 (six) hours as needed for severe pain.   traMADol 50 MG tablet Commonly known as: ULTRAM Take 1-2 tablets (50-100 mg total) by mouth every 6 (six) hours as needed for moderate pain.   Trulicity 1.5 0000000 Sopn Generic drug: Dulaglutide Inject content of one pen under the skin weekly   Yuvafem 10 MCG Tabs vaginal tablet Generic drug: Estradiol INSERT 1 TABLET VAGINALLY TWICE A WEEK         Follow-up: Return in about 3 months (around 07/03/2022).  Claretta Fraise, M.D.

## 2022-04-05 ENCOUNTER — Encounter (HOSPITAL_COMMUNITY): Payer: Self-pay | Admitting: Orthopedic Surgery

## 2022-04-05 NOTE — Anesthesia Preprocedure Evaluation (Signed)
Anesthesia Evaluation  Patient identified by MRN, date of birth, ID band Patient awake    Reviewed: Allergy & Precautions, NPO status , Patient's Chart, lab work & pertinent test results, reviewed documented beta blocker date and time   Airway Mallampati: II  TM Distance: >3 FB Neck ROM: Full    Dental  (+) Missing, Edentulous Upper, Dental Advisory Given,    Pulmonary asthma , pneumonia, resolved, Patient abstained from smoking., former smoker   Pulmonary exam normal breath sounds clear to auscultation       Cardiovascular hypertension, Pt. on medications Normal cardiovascular exam+ dysrhythmias  Rhythm:Regular Rate:Normal  EKG 09/20/21 NSR, 1st deg AV Block, low voltage   Neuro/Psych Neuropathy  Neuromuscular disease  negative psych ROS   GI/Hepatic Neg liver ROS,GERD  Medicated,,  Endo/Other  diabetes, Well Controlled, Type 2, Oral Hypoglycemic Agents  GLP-1 RA therapy- last dose 3/24 Hyperlipidemia  Renal/GU Renal diseaseHx/o CKD- normal Cr  negative genitourinary   Musculoskeletal  (+) Arthritis , Osteoarthritis,  OA left knee Osteoporosis Lumbar spinal stenosis   Abdominal   Peds  Hematology negative hematology ROS (+)   Anesthesia Other Findings   Reproductive/Obstetrics                             Anesthesia Physical Anesthesia Plan  ASA: 3  Anesthesia Plan: Spinal   Post-op Pain Management: Regional block* and Minimal or no pain anticipated   Induction: Intravenous  PONV Risk Score and Plan: 3 and Propofol infusion and Treatment may vary due to age or medical condition  Airway Management Planned: Natural Airway and Simple Face Mask  Additional Equipment: None  Intra-op Plan:   Post-operative Plan: Extubation in OR  Informed Consent: I have reviewed the patients History and Physical, chart, labs and discussed the procedure including the risks, benefits and  alternatives for the proposed anesthesia with the patient or authorized representative who has indicated his/her understanding and acceptance.     Dental advisory given  Plan Discussed with: CRNA and Anesthesiologist  Anesthesia Plan Comments:         Anesthesia Quick Evaluation

## 2022-04-08 ENCOUNTER — Observation Stay (HOSPITAL_COMMUNITY)
Admission: RE | Admit: 2022-04-08 | Discharge: 2022-04-09 | Disposition: A | Discharge status: Home / Self Care | Payer: Medicare Other | Attending: Orthopedic Surgery | Admitting: Orthopedic Surgery

## 2022-04-08 ENCOUNTER — Other Ambulatory Visit: Payer: Self-pay

## 2022-04-08 ENCOUNTER — Ambulatory Visit (HOSPITAL_COMMUNITY): Payer: Medicare Other | Admitting: Emergency Medicine

## 2022-04-08 ENCOUNTER — Ambulatory Visit (HOSPITAL_BASED_OUTPATIENT_CLINIC_OR_DEPARTMENT_OTHER): Payer: Medicare Other | Admitting: Anesthesiology

## 2022-04-08 ENCOUNTER — Encounter (HOSPITAL_COMMUNITY): Payer: Self-pay | Admitting: Orthopedic Surgery

## 2022-04-08 ENCOUNTER — Encounter (HOSPITAL_COMMUNITY)
Admission: RE | Disposition: A | Discharge status: Home / Self Care | Payer: Self-pay | Source: Home / Self Care | Attending: Orthopedic Surgery

## 2022-04-08 DIAGNOSIS — Z79899 Other long term (current) drug therapy: Secondary | ICD-10-CM | POA: Insufficient documentation

## 2022-04-08 DIAGNOSIS — F17210 Nicotine dependence, cigarettes, uncomplicated: Secondary | ICD-10-CM | POA: Insufficient documentation

## 2022-04-08 DIAGNOSIS — M179 Osteoarthritis of knee, unspecified: Secondary | ICD-10-CM | POA: Diagnosis present

## 2022-04-08 DIAGNOSIS — Z7985 Long-term (current) use of injectable non-insulin antidiabetic drugs: Secondary | ICD-10-CM | POA: Insufficient documentation

## 2022-04-08 DIAGNOSIS — Z7982 Long term (current) use of aspirin: Secondary | ICD-10-CM | POA: Insufficient documentation

## 2022-04-08 DIAGNOSIS — E119 Type 2 diabetes mellitus without complications: Secondary | ICD-10-CM

## 2022-04-08 DIAGNOSIS — E1122 Type 2 diabetes mellitus with diabetic chronic kidney disease: Secondary | ICD-10-CM | POA: Insufficient documentation

## 2022-04-08 DIAGNOSIS — J45909 Unspecified asthma, uncomplicated: Secondary | ICD-10-CM | POA: Diagnosis not present

## 2022-04-08 DIAGNOSIS — I10 Essential (primary) hypertension: Secondary | ICD-10-CM | POA: Diagnosis not present

## 2022-04-08 DIAGNOSIS — Z87891 Personal history of nicotine dependence: Secondary | ICD-10-CM

## 2022-04-08 DIAGNOSIS — M1712 Unilateral primary osteoarthritis, left knee: Principal | ICD-10-CM | POA: Insufficient documentation

## 2022-04-08 DIAGNOSIS — N189 Chronic kidney disease, unspecified: Secondary | ICD-10-CM | POA: Diagnosis not present

## 2022-04-08 DIAGNOSIS — Z01818 Encounter for other preprocedural examination: Secondary | ICD-10-CM

## 2022-04-08 DIAGNOSIS — Z85828 Personal history of other malignant neoplasm of skin: Secondary | ICD-10-CM | POA: Insufficient documentation

## 2022-04-08 DIAGNOSIS — Z96641 Presence of right artificial hip joint: Secondary | ICD-10-CM | POA: Diagnosis not present

## 2022-04-08 DIAGNOSIS — I129 Hypertensive chronic kidney disease with stage 1 through stage 4 chronic kidney disease, or unspecified chronic kidney disease: Secondary | ICD-10-CM | POA: Diagnosis not present

## 2022-04-08 DIAGNOSIS — G8918 Other acute postprocedural pain: Secondary | ICD-10-CM | POA: Diagnosis not present

## 2022-04-08 HISTORY — PX: TOTAL KNEE ARTHROPLASTY: SHX125

## 2022-04-08 LAB — GLUCOSE, CAPILLARY
Glucose-Capillary: 123 mg/dL — ABNORMAL HIGH (ref 70–99)
Glucose-Capillary: 125 mg/dL — ABNORMAL HIGH (ref 70–99)
Glucose-Capillary: 185 mg/dL — ABNORMAL HIGH (ref 70–99)
Glucose-Capillary: 189 mg/dL — ABNORMAL HIGH (ref 70–99)

## 2022-04-08 SURGERY — ARTHROPLASTY, KNEE, TOTAL
Anesthesia: Spinal | Site: Knee | Laterality: Left

## 2022-04-08 MED ORDER — SODIUM CHLORIDE (PF) 0.9 % IJ SOLN
INTRAMUSCULAR | Status: AC
  Filled 2022-04-08: qty 50

## 2022-04-08 MED ORDER — DOCUSATE SODIUM 100 MG PO CAPS
100.0000 mg | ORAL_CAPSULE | Freq: Two times a day (BID) | ORAL | Status: DC
  Administered 2022-04-08 – 2022-04-09 (×2): 100 mg via ORAL
  Filled 2022-04-08 (×2): qty 1

## 2022-04-08 MED ORDER — ROPIVACAINE HCL 5 MG/ML IJ SOLN
INTRAMUSCULAR | Status: DC | PRN
  Administered 2022-04-08: 30 mL via PERINEURAL

## 2022-04-08 MED ORDER — LACTATED RINGERS IV SOLN: INTRAVENOUS | Status: DC

## 2022-04-08 MED ORDER — SODIUM CHLORIDE 0.9 % IR SOLN
Status: DC | PRN
  Administered 2022-04-08: 1000 mL

## 2022-04-08 MED ORDER — FENTANYL CITRATE (PF) 100 MCG/2ML IJ SOLN
INTRAMUSCULAR | Status: AC
  Filled 2022-04-08: qty 2

## 2022-04-08 MED ORDER — OXYCODONE HCL 5 MG PO TABS
5.0000 mg | ORAL_TABLET | ORAL | Status: DC | PRN
  Administered 2022-04-08: 10 mg via ORAL
  Administered 2022-04-08: 5 mg via ORAL
  Administered 2022-04-09 (×2): 10 mg via ORAL
  Filled 2022-04-08 (×2): qty 2
  Filled 2022-04-08: qty 1
  Filled 2022-04-08: qty 2

## 2022-04-08 MED ORDER — INSULIN ASPART 100 UNIT/ML IJ SOLN
0.0000 [IU] | Freq: Three times a day (TID) | INTRAMUSCULAR | Status: DC
  Administered 2022-04-08: 3 [IU] via SUBCUTANEOUS

## 2022-04-08 MED ORDER — PROPOFOL 500 MG/50ML IV EMUL
INTRAVENOUS | Status: DC | PRN
  Administered 2022-04-08: 130 ug/kg/min via INTRAVENOUS

## 2022-04-08 MED ORDER — CLONIDINE HCL 0.1 MG PO TABS
0.1000 mg | ORAL_TABLET | Freq: Two times a day (BID) | ORAL | Status: DC
  Administered 2022-04-09: 0.1 mg via ORAL
  Filled 2022-04-08: qty 1

## 2022-04-08 MED ORDER — AMISULPRIDE (ANTIEMETIC) 5 MG/2ML IV SOLN: 10.0000 mg | Freq: Once | INTRAVENOUS | Status: DC | PRN

## 2022-04-08 MED ORDER — CHLORHEXIDINE GLUCONATE 0.12 % MT SOLN: 15.0000 mL | Freq: Once | OROMUCOSAL | Status: AC

## 2022-04-08 MED ORDER — OXYCODONE HCL 5 MG PO TABS: 5.0000 mg | ORAL_TABLET | Freq: Once | ORAL | Status: DC | PRN

## 2022-04-08 MED ORDER — PANTOPRAZOLE SODIUM 40 MG PO TBEC
80.0000 mg | DELAYED_RELEASE_TABLET | Freq: Every day | ORAL | Status: DC
  Administered 2022-04-09: 80 mg via ORAL
  Filled 2022-04-08: qty 2

## 2022-04-08 MED ORDER — SODIUM CHLORIDE (PF) 0.9 % IJ SOLN
INTRAMUSCULAR | Status: AC
  Filled 2022-04-08: qty 10

## 2022-04-08 MED ORDER — BUPIVACAINE LIPOSOME 1.3 % IJ SUSP
INTRAMUSCULAR | Status: AC
  Filled 2022-04-08: qty 20

## 2022-04-08 MED ORDER — FLEET ENEMA 7-19 GM/118ML RE ENEM: 1.0000 | ENEMA | Freq: Once | RECTAL | Status: DC | PRN

## 2022-04-08 MED ORDER — BUPIVACAINE LIPOSOME 1.3 % IJ SUSP: 20.0000 mL | Freq: Once | INTRAMUSCULAR | Status: AC

## 2022-04-08 MED ORDER — ACETAMINOPHEN 10 MG/ML IV SOLN
1000.0000 mg | Freq: Four times a day (QID) | INTRAVENOUS | Status: DC
  Administered 2022-04-08: 1000 mg via INTRAVENOUS
  Filled 2022-04-08: qty 100

## 2022-04-08 MED ORDER — HYDROCHLOROTHIAZIDE 25 MG PO TABS
25.0000 mg | ORAL_TABLET | Freq: Every day | ORAL | Status: DC
  Administered 2022-04-09: 25 mg via ORAL
  Filled 2022-04-08: qty 1

## 2022-04-08 MED ORDER — TRAMADOL HCL 50 MG PO TABS
50.0000 mg | ORAL_TABLET | Freq: Four times a day (QID) | ORAL | Status: DC | PRN
  Administered 2022-04-08 – 2022-04-09 (×3): 100 mg via ORAL
  Filled 2022-04-08 (×3): qty 2

## 2022-04-08 MED ORDER — PHENYLEPHRINE HCL-NACL 20-0.9 MG/250ML-% IV SOLN
INTRAVENOUS | Status: DC | PRN
  Administered 2022-04-08: 50 ug/min via INTRAVENOUS

## 2022-04-08 MED ORDER — FLUTICASONE FUROATE-VILANTEROL 200-25 MCG/ACT IN AEPB
1.0000 | INHALATION_SPRAY | Freq: Every day | RESPIRATORY_TRACT | Status: DC
  Administered 2022-04-09: 1 via RESPIRATORY_TRACT
  Filled 2022-04-08: qty 28

## 2022-04-08 MED ORDER — CEFAZOLIN SODIUM-DEXTROSE 2-4 GM/100ML-% IV SOLN
2.0000 g | INTRAVENOUS | Status: AC
  Administered 2022-04-08: 2 g via INTRAVENOUS
  Filled 2022-04-08: qty 100

## 2022-04-08 MED ORDER — MENTHOL 3 MG MT LOZG: 1.0000 | LOZENGE | OROMUCOSAL | Status: DC | PRN

## 2022-04-08 MED ORDER — FENOFIBRATE 160 MG PO TABS
160.0000 mg | ORAL_TABLET | Freq: Every day | ORAL | Status: DC
  Filled 2022-04-08: qty 1

## 2022-04-08 MED ORDER — PROPOFOL 1000 MG/100ML IV EMUL
INTRAVENOUS | Status: AC
  Filled 2022-04-08: qty 100

## 2022-04-08 MED ORDER — ORAL CARE MOUTH RINSE: 15.0000 mL | OROMUCOSAL | Status: DC | PRN

## 2022-04-08 MED ORDER — POVIDONE-IODINE 10 % EX SWAB: 2.0000 | Freq: Once | CUTANEOUS | Status: DC

## 2022-04-08 MED ORDER — BUPIVACAINE IN DEXTROSE 0.75-8.25 % IT SOLN
INTRATHECAL | Status: DC | PRN
  Administered 2022-04-08: 1.6 mL via INTRATHECAL

## 2022-04-08 MED ORDER — ONDANSETRON HCL 4 MG PO TABS: 4.0000 mg | ORAL_TABLET | Freq: Four times a day (QID) | ORAL | Status: DC | PRN

## 2022-04-08 MED ORDER — PHENYLEPHRINE HCL-NACL 20-0.9 MG/250ML-% IV SOLN
INTRAVENOUS | Status: AC
  Filled 2022-04-08: qty 250

## 2022-04-08 MED ORDER — ONDANSETRON HCL 4 MG/2ML IJ SOLN: 4.0000 mg | Freq: Once | INTRAMUSCULAR | Status: DC | PRN

## 2022-04-08 MED ORDER — ASPIRIN 81 MG PO CHEW
81.0000 mg | CHEWABLE_TABLET | Freq: Two times a day (BID) | ORAL | Status: DC
  Administered 2022-04-09: 81 mg via ORAL
  Filled 2022-04-08: qty 1

## 2022-04-08 MED ORDER — SODIUM CHLORIDE (PF) 0.9 % IJ SOLN
INTRAMUSCULAR | Status: DC | PRN
  Administered 2022-04-08: 60 mL

## 2022-04-08 MED ORDER — METHOCARBAMOL 1000 MG/10ML IJ SOLN: 500.0000 mg | Freq: Four times a day (QID) | INTRAVENOUS | Status: DC | PRN

## 2022-04-08 MED ORDER — HYDROMORPHONE HCL 1 MG/ML IJ SOLN: 0.5000 mg | INTRAMUSCULAR | Status: DC | PRN

## 2022-04-08 MED ORDER — ONDANSETRON HCL 4 MG/2ML IJ SOLN: 4.0000 mg | Freq: Four times a day (QID) | INTRAMUSCULAR | Status: DC | PRN

## 2022-04-08 MED ORDER — DIPHENHYDRAMINE HCL 12.5 MG/5ML PO ELIX: 12.5000 mg | ORAL_SOLUTION | ORAL | Status: DC | PRN

## 2022-04-08 MED ORDER — ALBUTEROL SULFATE (2.5 MG/3ML) 0.083% IN NEBU: 3.0000 mL | INHALATION_SOLUTION | Freq: Four times a day (QID) | RESPIRATORY_TRACT | Status: DC | PRN

## 2022-04-08 MED ORDER — POLYETHYLENE GLYCOL 3350 17 G PO PACK: 17.0000 g | PACK | Freq: Every day | ORAL | Status: DC | PRN

## 2022-04-08 MED ORDER — HYDROMORPHONE HCL 1 MG/ML IJ SOLN: 0.2500 mg | INTRAMUSCULAR | Status: DC | PRN

## 2022-04-08 MED ORDER — PROPOFOL 10 MG/ML IV BOLUS
INTRAVENOUS | Status: DC | PRN
  Administered 2022-04-08: 10 mg via INTRAVENOUS
  Administered 2022-04-08 (×2): 20 mg via INTRAVENOUS

## 2022-04-08 MED ORDER — FENTANYL CITRATE (PF) 100 MCG/2ML IJ SOLN
INTRAMUSCULAR | Status: DC | PRN
  Administered 2022-04-08: 100 ug via INTRAVENOUS

## 2022-04-08 MED ORDER — ORAL CARE MOUTH RINSE: 15.0000 mL | Freq: Once | OROMUCOSAL | Status: AC

## 2022-04-08 MED ORDER — ATORVASTATIN CALCIUM 40 MG PO TABS
80.0000 mg | ORAL_TABLET | Freq: Every day | ORAL | Status: DC
  Filled 2022-04-08: qty 2

## 2022-04-08 MED ORDER — DEXAMETHASONE SODIUM PHOSPHATE 10 MG/ML IJ SOLN
8.0000 mg | Freq: Once | INTRAMUSCULAR | Status: AC
  Administered 2022-04-08: 8 mg via INTRAVENOUS

## 2022-04-08 MED ORDER — IRBESARTAN 150 MG PO TABS
300.0000 mg | ORAL_TABLET | Freq: Every day | ORAL | Status: DC
  Administered 2022-04-09: 300 mg via ORAL
  Filled 2022-04-08: qty 2

## 2022-04-08 MED ORDER — EMPAGLIFLOZIN 25 MG PO TABS
25.0000 mg | ORAL_TABLET | Freq: Every day | ORAL | Status: DC
  Administered 2022-04-09: 25 mg via ORAL
  Filled 2022-04-08: qty 1

## 2022-04-08 MED ORDER — GLIMEPIRIDE 4 MG PO TABS
4.0000 mg | ORAL_TABLET | Freq: Every day | ORAL | Status: DC
  Administered 2022-04-09: 4 mg via ORAL
  Filled 2022-04-08: qty 1

## 2022-04-08 MED ORDER — SODIUM CHLORIDE 0.9 % IV SOLN: INTRAVENOUS | Status: DC

## 2022-04-08 MED ORDER — CEFAZOLIN SODIUM-DEXTROSE 2-4 GM/100ML-% IV SOLN
2.0000 g | Freq: Four times a day (QID) | INTRAVENOUS | Status: AC
  Administered 2022-04-08 (×2): 2 g via INTRAVENOUS
  Filled 2022-04-08 (×2): qty 100

## 2022-04-08 MED ORDER — METHOCARBAMOL 500 MG PO TABS
500.0000 mg | ORAL_TABLET | Freq: Four times a day (QID) | ORAL | Status: DC | PRN
  Administered 2022-04-08 – 2022-04-09 (×3): 500 mg via ORAL
  Filled 2022-04-08 (×4): qty 1

## 2022-04-08 MED ORDER — METOCLOPRAMIDE HCL 5 MG/ML IJ SOLN: 5.0000 mg | Freq: Three times a day (TID) | INTRAMUSCULAR | Status: DC | PRN

## 2022-04-08 MED ORDER — PHENOL 1.4 % MT LIQD: 1.0000 | OROMUCOSAL | Status: DC | PRN

## 2022-04-08 MED ORDER — TELMISARTAN-HCTZ 80-25 MG PO TABS: 1.0000 | ORAL_TABLET | Freq: Every morning | ORAL | Status: DC

## 2022-04-08 MED ORDER — BUPIVACAINE LIPOSOME 1.3 % IJ SUSP
INTRAMUSCULAR | Status: DC | PRN
  Administered 2022-04-08: 20 mL

## 2022-04-08 MED ORDER — ONDANSETRON HCL 4 MG/2ML IJ SOLN
INTRAMUSCULAR | Status: DC | PRN
  Administered 2022-04-08: 4 mg via INTRAVENOUS

## 2022-04-08 MED ORDER — OXYCODONE HCL 5 MG/5ML PO SOLN: 5.0000 mg | Freq: Once | ORAL | Status: DC | PRN

## 2022-04-08 MED ORDER — METOCLOPRAMIDE HCL 5 MG PO TABS: 5.0000 mg | ORAL_TABLET | Freq: Three times a day (TID) | ORAL | Status: DC | PRN

## 2022-04-08 MED ORDER — BISACODYL 10 MG RE SUPP: 10.0000 mg | Freq: Every day | RECTAL | Status: DC | PRN

## 2022-04-08 MED ORDER — 0.9 % SODIUM CHLORIDE (POUR BTL) OPTIME
TOPICAL | Status: DC | PRN
  Administered 2022-04-08: 1000 mL

## 2022-04-08 MED ORDER — TRANEXAMIC ACID-NACL 1000-0.7 MG/100ML-% IV SOLN
1000.0000 mg | INTRAVENOUS | Status: AC
  Administered 2022-04-08: 1000 mg via INTRAVENOUS
  Filled 2022-04-08: qty 100

## 2022-04-08 MED ORDER — ACETAMINOPHEN 500 MG PO TABS
1000.0000 mg | ORAL_TABLET | Freq: Four times a day (QID) | ORAL | Status: AC
  Administered 2022-04-08 – 2022-04-09 (×4): 1000 mg via ORAL
  Filled 2022-04-08 (×4): qty 2

## 2022-04-08 MED ORDER — INSULIN ASPART 100 UNIT/ML IJ SOLN: 0.0000 [IU] | Freq: Every day | INTRAMUSCULAR | Status: DC

## 2022-04-08 SURGICAL SUPPLY — 60 items
ATTUNE MED DOME PAT 38 KNEE (Knees) IMPLANT
ATTUNE PS FEM LT SZ 5 CEM KNEE (Femur) IMPLANT
ATTUNE PSRP INSE SZ5 7 KNEE (Insert) IMPLANT
BAG COUNTER SPONGE SURGICOUNT (BAG) IMPLANT
BAG SPEC THK2 15X12 ZIP CLS (MISCELLANEOUS) ×1
BAG SPNG CNTER NS LX DISP (BAG) ×1
BAG ZIPLOCK 12X15 (MISCELLANEOUS) ×1 IMPLANT
BASE TIBIAL ROT PLAT SZ 5 KNEE (Knees) IMPLANT
BLADE SAG 18X100X1.27 (BLADE) ×1 IMPLANT
BLADE SAW SGTL 11.0X1.19X90.0M (BLADE) ×1 IMPLANT
BNDG CMPR 5X62 HK CLSR LF (GAUZE/BANDAGES/DRESSINGS) ×1
BNDG ELASTIC 4X5.8 VLCR STR LF (GAUZE/BANDAGES/DRESSINGS) IMPLANT
BNDG ELASTIC 6INX 5YD STR LF (GAUZE/BANDAGES/DRESSINGS) ×1 IMPLANT
BOWL SMART MIX CTS (DISPOSABLE) ×1 IMPLANT
BSPLAT TIB 5 CMNT ROT PLAT STR (Knees) ×1 IMPLANT
CEMENT HV SMART SET (Cement) ×2 IMPLANT
COVER SURGICAL LIGHT HANDLE (MISCELLANEOUS) ×1 IMPLANT
CUFF TOURN SGL QUICK 34 (TOURNIQUET CUFF) ×1
CUFF TRNQT CYL 34X4.125X (TOURNIQUET CUFF) ×1 IMPLANT
DRAPE INCISE IOBAN 66X45 STRL (DRAPES) ×1 IMPLANT
DRAPE U-SHAPE 47X51 STRL (DRAPES) ×1 IMPLANT
DRSG AQUACEL AG ADV 3.5X10 (GAUZE/BANDAGES/DRESSINGS) ×1 IMPLANT
DURAPREP 26ML APPLICATOR (WOUND CARE) ×1 IMPLANT
ELECT REM PT RETURN 15FT ADLT (MISCELLANEOUS) ×1 IMPLANT
GLOVE BIO SURGEON STRL SZ 6.5 (GLOVE) IMPLANT
GLOVE BIO SURGEON STRL SZ7.5 (GLOVE) IMPLANT
GLOVE BIO SURGEON STRL SZ8 (GLOVE) ×1 IMPLANT
GLOVE BIOGEL PI IND STRL 6.5 (GLOVE) IMPLANT
GLOVE BIOGEL PI IND STRL 7.0 (GLOVE) IMPLANT
GLOVE BIOGEL PI IND STRL 8 (GLOVE) ×1 IMPLANT
GOWN STRL REUS W/ TWL LRG LVL3 (GOWN DISPOSABLE) ×1 IMPLANT
GOWN STRL REUS W/ TWL XL LVL3 (GOWN DISPOSABLE) IMPLANT
GOWN STRL REUS W/TWL LRG LVL3 (GOWN DISPOSABLE) ×1
GOWN STRL REUS W/TWL XL LVL3 (GOWN DISPOSABLE)
HANDPIECE INTERPULSE COAX TIP (DISPOSABLE) ×1
HOLDER FOLEY CATH W/STRAP (MISCELLANEOUS) IMPLANT
IMMOBILIZER KNEE 20 (SOFTGOODS) ×1
IMMOBILIZER KNEE 20 THIGH 36 (SOFTGOODS) ×1 IMPLANT
KIT TURNOVER KIT A (KITS) IMPLANT
MANIFOLD NEPTUNE II (INSTRUMENTS) ×1 IMPLANT
NS IRRIG 1000ML POUR BTL (IV SOLUTION) ×1 IMPLANT
PACK TOTAL KNEE CUSTOM (KITS) ×1 IMPLANT
PADDING CAST COTTON 6X4 STRL (CAST SUPPLIES) ×2 IMPLANT
PIN STEINMAN FIXATION KNEE (PIN) IMPLANT
PROTECTOR NERVE ULNAR (MISCELLANEOUS) ×1 IMPLANT
SET HNDPC FAN SPRY TIP SCT (DISPOSABLE) ×1 IMPLANT
SLEEVE SCD COMPRESS KNEE MED (STOCKING) IMPLANT
SPIKE FLUID TRANSFER (MISCELLANEOUS) ×1 IMPLANT
STRIP CLOSURE SKIN 1/2X4 (GAUZE/BANDAGES/DRESSINGS) ×2 IMPLANT
SUT MNCRL AB 4-0 PS2 18 (SUTURE) ×1 IMPLANT
SUT STRATAFIX 0 PDS 27 VIOLET (SUTURE) ×1
SUT VIC AB 2-0 CT1 27 (SUTURE) ×4
SUT VIC AB 2-0 CT1 TAPERPNT 27 (SUTURE) ×3 IMPLANT
SUTURE STRATFX 0 PDS 27 VIOLET (SUTURE) ×1 IMPLANT
TIBIAL BASE ROT PLAT SZ 5 KNEE (Knees) ×1 IMPLANT
TRAY FOLEY MTR SLVR 14FR STAT (SET/KITS/TRAYS/PACK) IMPLANT
TRAY FOLEY MTR SLVR 16FR STAT (SET/KITS/TRAYS/PACK) ×1 IMPLANT
TUBE SUCTION HIGH CAP CLEAR NV (SUCTIONS) ×1 IMPLANT
WATER STERILE IRR 1000ML POUR (IV SOLUTION) ×2 IMPLANT
WRAP KNEE MAXI GEL POST OP (GAUZE/BANDAGES/DRESSINGS) ×1 IMPLANT

## 2022-04-08 NOTE — Evaluation (Signed)
Physical Therapy Evaluation Patient Details Name: Rita Payne MRN: [id number] DOB: [birth year] Today's Date: 04/08/2022  History of Present Illness  77 yo female s/p L TKA on 04/08/22, PMH: HTN, lumbar decompression L3-4, L4-5, DM, asthma.  Clinical Impression  Pt is s/p TKA resulting in the deficits listed below (see PT Problem List).  Pt OOB/standing limited d/t residual effects of spinal, pt glutes and plantar surface of feet numb 5+hours post anesthesia. Pt sat EOB and performed lateral scooting along EOB with min assist followed by L TKA exercises.  Anticipate good progress once anesthesia fully resolved.   Pt will benefit from acute skilled PT to increase their independence and safety with mobility to allow discharge.         Recommendations for follow up therapy are one component of a multi-disciplinary discharge planning process, led by the attending physician.  Recommendations may be updated based on patient status, additional functional criteria and insurance authorization.  Follow Up Recommendations       Assistance Recommended at Discharge Intermittent Supervision/Assistance  Patient can return home with the following  A little help with walking and/or transfers;A little help with bathing/dressing/bathroom;Assistance with cooking/housework;Assist for transportation;Help with stairs or ramp for entrance    Equipment Recommendations None recommended by PT  Recommendations for Other Services       Functional Status Assessment Patient has had a recent decline in their functional status and demonstrates the ability to make significant improvements in function in a reasonable and predictable amount of time.     Precautions / Restrictions Precautions Precautions: Fall;Knee Required Braces or Orthoses: Knee Immobilizer - Left Knee Immobilizer - Left: Discontinue once straight leg raise with < 10 degree lag Restrictions Weight Bearing Restrictions: No Other Position/Activity  Restrictions: WBAT      Mobility  Bed Mobility Overal bed mobility: Needs Assistance Bed Mobility: Supine to Sit, Sit to Supine     Supine to sit: Min guard, Min assist Sit to supine: Min guard   General bed mobility comments: assist to progress LLE off EOB, min/guard for safety    Transfers Overall transfer level: Needs assistance                 General transfer comment: unable to stand d/t glutes and feet numb, residual effects of anesthesia; pt able to scoot laterally along EOB with min assist    Ambulation/Gait                  Stairs            Wheelchair Mobility    Modified Rankin (Stroke Patients Only)       Balance                                             Pertinent Vitals/Pain Pain Assessment Pain Assessment: 0-10 Pain Score: 2  Pain Location: L TKA Pain Descriptors / Indicators: Discomfort Pain Intervention(s): Limited activity within patient's tolerance, Monitored during session, Premedicated before session, Repositioned    Home Living Family/patient expects to be discharged to:: Private residence Living Arrangements: Alone   Type of Home: House Home Access: Ramped entrance       Home Layout: One level Home Equipment: Conservation officer, nature (2 wheels);BSC/3in1      Prior Function Prior Level of Function : Independent/Modified Independent 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 Very independent at her baseline. Dtr to assist after surgery x 2 wks                     Hand Dominance        Extremity/Trunk Assessment   Upper Extremity Assessment Upper Extremity Assessment: Overall WFL for tasks assessed    Lower Extremity Assessment Lower Extremity Assessment: LLE deficits/detail LLE Deficits / Details: ankle 4/5 (sensation diminished L foot likely d/t residual effects of spinal anesthesia), knee extension and hip flexion 2+/5;       Communication      Cognition  Arousal/Alertness: Awake/alert Behavior During Therapy: WFL for tasks assessed/performed Overall Cognitive Status: Within Functional Limits for tasks assessed                                          General Comments      Exercises Total Joint Exercises Ankle Circles/Pumps: AROM, Both, 10 reps Quad Sets: AROM, Both, 5 reps Straight Leg Raises: AAROM, Left, AROM, 5 reps   Assessment/Plan    PT Assessment Patient needs continued PT services  PT Problem List Decreased strength;Decreased range of motion;Decreased activity tolerance;Decreased mobility;Decreased knowledge of precautions;Decreased knowledge of use of DME;Pain       PT Treatment Interventions DME instruction;Gait training;Functional mobility training;Therapeutic activities;Patient/family education;Therapeutic exercise    PT Goals (Current goals can be found in the Care Plan section)  Acute Rehab PT Goals PT Goal Formulation: With patient Time For Goal Achievement: 04/15/22 Potential to Achieve Goals: Good    Frequency 7X/week     Co-evaluation               AM-PAC PT "6 Clicks" Mobility  Outcome Measure Help needed turning from your back to your side while in a flat bed without using bedrails?: A Little Help needed moving from lying on your back to sitting on the side of a flat bed without using bedrails?: A Little Help needed moving to and from a bed to a chair (including a wheelchair)?: A Lot Help needed standing up from a chair using your arms (e.g., wheelchair or bedside chair)?: Total Help needed to walk in hospital room?: Total Help needed climbing 3-5 steps with a railing? : Total 6 Click Score: 11    End of Session Equipment Utilized During Treatment: Gait belt;Left knee immobilizer Activity Tolerance: Other (comment) (limited by residual effects of spinal) Patient left: in bed;with call bell/phone within reach;with bed alarm set   PT Visit Diagnosis: Other abnormalities of  gait and mobility (R26.89);Difficulty in walking, not elsewhere classified (R26.2)    Time: [id number] PT Time Calculation (min) (ACUTE ONLY): 24 min   Charges:   PT Evaluation $PT Eval Low Complexity: 1 Low PT Treatments $Therapeutic Activity: 8-22 mins        Baxter Flattery, PT  Acute Rehab Dept [facility]) [phone]  04/08/2022   [facility] 04/08/2022, 2:45 PM

## 2022-04-08 NOTE — Care Plan (Signed)
Ortho Bundle Case Management Note  Patient Details  Name: Rita Payne MRN: [id number] Date of Birth: April 29, 1945                  L TKA on 04/08/22.  DCP: Home with daughter and grandkids.  DME: No needs. Has RW.  PT: [facility]   DME Arranged:  N/A DME Agency:       Additional Comments: Please contact me with any questions of if this plan should need to change.    Dario Ave, Case Manager  [facility]  [phone] 04/08/2022, 1:20 PM

## 2022-04-08 NOTE — Anesthesia Procedure Notes (Signed)
Anesthesia Regional Block: Adductor canal block   Pre-Anesthetic Checklist: , timeout performed,  Correct Patient, Correct Site, Correct Laterality,  Correct Procedure, Correct Position, site marked,  Risks and benefits discussed,  Surgical consent,  Pre-op evaluation,  At surgeon's request and post-op pain management  Laterality: Left  Prep: chloraprep       Needles:  Injection technique: Single-shot  Needle Type: Echogenic Stimulator Needle     Needle Length: 10cm  Needle Gauge: 21   Needle insertion depth: 7 cm   Additional Needles:   Procedures:,,,, ultrasound used (permanent image in chart),,    Narrative:  Start time: 04/08/2022 7:08 AM End time: 04/08/2022 7:13 AM Injection made incrementally with aspirations every 5 mL.  Performed by: Personally  Anesthesiologist: Josephine Igo, MD  Additional Notes: Timeout performed. Patient sedated. Relevant anatomy ID'd using [place]. Incremental 2-16ml injection of LA with frequent aspiration. Patient tolerated procedure well.

## 2022-04-08 NOTE — Interval H&P Note (Signed)
History and Physical Interval Note:  04/08/2022 6:27 AM  Rita Payne  has presented today for surgery, with the diagnosis of left knee osteoarthritis.  The various methods of treatment have been discussed with the patient and family. After consideration of risks, benefits and other options for treatment, the patient has consented to  Procedure(s): TOTAL KNEE ARTHROPLASTY (Left) as a surgical intervention.  The patient's history has been reviewed, patient examined, no change in status, stable for surgery.  I have reviewed the patient's chart and labs.  Questions were answered to the patient's satisfaction.     Pilar Plate Chessica Audia

## 2022-04-08 NOTE — Progress Notes (Signed)
Orthopedic Tech Progress Note Patient Details:  Rita Payne April 23, 1945 [id number]  CPM Left Knee CPM Left Knee: On Left Knee Flexion (Degrees): 40 Left Knee Extension (Degrees): 10  Post Interventions Patient Tolerated: Well  Rita Payne 04/08/2022, 9:38 AM

## 2022-04-08 NOTE — Progress Notes (Signed)
Orthopedic Tech Progress Note Patient Details:  Rita Payne 1945-03-01 [id number]  CPM Left Knee CPM Left Knee: Off Left Knee Flexion (Degrees): 40 Left Knee Extension (Degrees): 10  Post Interventions Patient Tolerated: Well  [name] 04/08/2022, 1:34 PM

## 2022-04-08 NOTE — Discharge Instructions (Signed)
 Frank Aluisio, MD Total Joint Specialist [facility] [address] [place], [place] [address] [phone]  TOTAL KNEE REPLACEMENT POSTOPERATIVE DIRECTIONS    Knee Rehabilitation, Guidelines Following Surgery  Results after knee surgery are often greatly improved when you follow the exercise, range of motion and muscle strengthening exercises prescribed by your doctor. Safety measures are also important to protect the knee from further injury. If any of these exercises cause you to have increased pain or swelling in your knee joint, decrease the amount until you are comfortable again and slowly increase them. If you have problems or questions, call your caregiver or physical therapist for advice.   BLOOD CLOT PREVENTION Take an 81 mg Aspirin two times a day for three weeks following surgery. Then resume one 81 mg Aspirin once a day. You may resume your vitamins/supplements upon discharge from the hospital. Do not take any NSAIDs (Advil, Aleve, Ibuprofen, Meloxicam, etc.) until you are 3 weeks out from surgery  HOME CARE INSTRUCTIONS  Remove items at home which could result in a fall. This includes throw rugs or furniture in walking pathways.  ICE to the affected knee as much as tolerated. Icing helps control swelling. If the swelling is well controlled you will be more comfortable and rehab easier. Continue to use ice on the knee for pain and swelling from surgery. You may notice swelling that will progress down to the foot and ankle. This is normal after surgery. Elevate the leg when you are not up walking on it.    Continue to use the breathing machine which will help keep your temperature down. It is common for your temperature to cycle up and down following surgery, especially at night when you are not up moving around and exerting yourself. The breathing machine keeps your lungs expanded and your temperature down. Do not place pillow under the operative  knee, focus on keeping the knee straight while resting  DIET You may resume your previous home diet once you are discharged from the hospital.  DRESSING / WOUND CARE / SHOWERING Keep your bulky bandage on for 2 days. On the third post-operative day you may remove the Ace bandage and gauze. There is a waterproof adhesive bandage on your skin which will stay in place until your first follow-up appointment. Once you remove this you will not need to place another bandage You may begin showering 3 days following surgery, but do not submerge the incision under water.  ACTIVITY For the first 5 days, the key is rest and control of pain and swelling Do your home exercises twice a day starting on post-operative day 3. On the days you go to physical therapy, just do the home exercises once that day. You should rest, ice and elevate the leg for 50 minutes out of every hour. Get up and walk/stretch for 10 minutes per hour. After 5 days you can increase your activity slowly as tolerated. Walk with your walker as instructed. Use the walker until you are comfortable transitioning to a cane. Walk with the cane in the opposite hand of the operative leg. You may discontinue the cane once you are comfortable and walking steadily. Avoid periods of inactivity such as sitting longer than an hour when not asleep. This helps prevent blood clots.  You may discontinue the knee immobilizer once you are able to perform a straight leg raise while lying down. You may resume a sexual relationship in one month or when given the OK by   your doctor.  You may return to work once you are cleared by your doctor.  Do not drive a car for 6 weeks or until released by your surgeon.  Do not drive while taking narcotics.  TED HOSE STOCKINGS Wear the elastic stockings on both legs for three weeks following surgery during the day. You may remove them at night for sleeping.  WEIGHT BEARING Weight bearing as tolerated with assist device  (walker, cane, etc) as directed, use it as long as suggested by your surgeon or therapist, typically at least 4-6 weeks.  POSTOPERATIVE CONSTIPATION PROTOCOL Constipation - defined medically as fewer than three stools per week and severe constipation as less than one stool per week.  One of the most common issues patients have following surgery is constipation.  Even if you have a regular bowel pattern at home, your normal regimen is likely to be disrupted due to multiple reasons following surgery.  Combination of anesthesia, postoperative narcotics, change in appetite and fluid intake all can affect your bowels.  In order to avoid complications following surgery, here are some recommendations in order to help you during your recovery period.  Colace (docusate) - Pick up an over-the-counter form of Colace or another stool softener and take twice a day as long as you are requiring postoperative pain medications.  Take with a full glass of water daily.  If you experience loose stools or diarrhea, hold the colace until you stool forms back up. If your symptoms do not get better within 1 week or if they get worse, check with your doctor. Dulcolax (bisacodyl) - Pick up over-the-counter and take as directed by the product packaging as needed to assist with the movement of your bowels.  Take with a full glass of water.  Use this product as needed if not relieved by Colace only.  MiraLax (polyethylene glycol) - Pick up over-the-counter to have on hand. MiraLax is a solution that will increase the amount of water in your bowels to assist with bowel movements.  Take as directed and can mix with a glass of water, juice, soda, coffee, or tea. Take if you go more than two days without a movement. Do not use MiraLax more than once per day. Call your doctor if you are still constipated or irregular after using this medication for 7 days in a row.  If you continue to have problems with postoperative constipation, please  contact the office for further assistance and recommendations.  If you experience "the worst abdominal pain ever" or develop nausea or vomiting, please contact the office immediatly for further recommendations for treatment.  ITCHING If you experience itching with your medications, try taking only a single pain pill, or even half a pain pill at a time.  You can also use Benadryl over the counter for itching or also to help with sleep.   MEDICATIONS See your medication summary on the "After Visit Summary" that the nursing staff will review with you prior to discharge.  You may have some home medications which will be placed on hold until you complete the course of blood thinner medication.  It is important for you to complete the blood thinner medication as prescribed by your surgeon.  Continue your approved medications as instructed at time of discharge.  PRECAUTIONS If you experience chest pain or shortness of breath - call 911 immediately for transfer to the hospital emergency department.  If you develop a fever greater that 101 F, purulent drainage from wound, increased redness   or drainage from wound, foul odor from the wound/dressing, or calf pain - CONTACT YOUR SURGEON.                                                   FOLLOW-UP APPOINTMENTS Make sure you keep all of your appointments after your operation with your surgeon and caregivers. You should call the office at the above phone number and make an appointment for approximately two weeks after the date of your surgery or on the date instructed by your surgeon outlined in the "After Visit Summary".  RANGE OF MOTION AND STRENGTHENING EXERCISES  Rehabilitation of the knee is important following a knee injury or an operation. After just a few days of immobilization, the muscles of the thigh which control the knee become weakened and shrink (atrophy). Knee exercises are designed to build up the tone and strength of the thigh muscles and to  improve knee motion. Often times heat used for twenty to thirty minutes before working out will loosen up your tissues and help with improving the range of motion but do not use heat for the first two weeks following surgery. These exercises can be done on a training (exercise) mat, on the floor, on a table or on a bed. Use what ever works the best and is most comfortable for you Knee exercises include:  Leg Lifts - While your knee is still immobilized in a splint or cast, you can do straight leg raises. Lift the leg to 60 degrees, hold for 3 sec, and slowly lower the leg. Repeat 10-20 times 2-3 times daily. Perform this exercise against resistance later as your knee gets better.  Quad and Hamstring Sets - Tighten up the muscle on the front of the thigh (Quad) and hold for 5-10 sec. Repeat this 10-20 times hourly. Hamstring sets are done by pushing the foot backward against an object and holding for 5-10 sec. Repeat as with quad sets.  Leg Slides: Lying on your back, slowly slide your foot toward your buttocks, bending your knee up off the floor (only go as far as is comfortable). Then slowly slide your foot back down until your leg is flat on the floor again. Angel Wings: Lying on your back spread your legs to the side as far apart as you can without causing discomfort.  A rehabilitation program following serious knee injuries can speed recovery and prevent re-injury in the future due to weakened muscles. Contact your doctor or a physical therapist for more information on knee rehabilitation.   POST-OPERATIVE OPIOID TAPER INSTRUCTIONS: It is important to wean off of your opioid medication as soon as possible. If you do not need pain medication after your surgery it is ok to stop day one. Opioids include: Codeine, Hydrocodone(Norco, Vicodin), Oxycodone(Percocet, oxycontin) and hydromorphone amongst others.  Long term and even short term use of opiods can cause: Increased pain  response Dependence Constipation Depression Respiratory depression And more.  Withdrawal symptoms can include Flu like symptoms Nausea, vomiting And more Techniques to manage these symptoms Hydrate well Eat regular healthy meals Stay active Use relaxation techniques(deep breathing, meditating, yoga) Do Not substitute Alcohol to help with tapering If you have been on opioids for less than two weeks and do not have pain than it is ok to stop all together.  Plan to wean off of opioids This   plan should start within one week post op of your joint replacement. Maintain the same interval or time between taking each dose and first decrease the dose.  Cut the total daily intake of opioids by one tablet each day Next start to increase the time between doses. The last dose that should be eliminated is the evening dose.   IF YOU ARE TRANSFERRED TO A SKILLED REHAB FACILITY If the patient is transferred to a skilled rehab facility following release from the hospital, a list of the current medications will be sent to the facility for the patient to continue.  When discharged from the skilled rehab facility, please have the facility set up the patient's Home Health Physical Therapy prior to being released. Also, the skilled facility will be responsible for providing the patient with their medications at time of release from the facility to include their pain medication, the muscle relaxants, and their blood thinner medication. If the patient is still at the rehab facility at time of the two week follow up appointment, the skilled rehab facility will also need to assist the patient in arranging follow up appointment in our office and any transportation needs.  MAKE SURE YOU:  Understand these instructions.  Get help right away if you are not doing well or get worse.   DENTAL ANTIBIOTICS:  In most cases prophylactic antibiotics for Dental procdeures after total joint surgery are not  necessary.  Exceptions are as follows:  1. History of prior total joint infection  2. Severely immunocompromised (Organ Transplant, cancer chemotherapy, Rheumatoid biologic meds such as Humera)  3. Poorly controlled diabetes (A1C &gt; 8.0, blood glucose over 200)  If you have one of these conditions, contact your surgeon for an antibiotic prescription, prior to your dental procedure.    Pick up stool softner and laxative for home use following surgery while on pain medications. Do not submerge incision under water. Please use good hand washing techniques while changing dressing each day. May shower starting three days after surgery. Please use a clean towel to pat the incision dry following showers. Continue to use ice for pain and swelling after surgery. Do not use any lotions or creams on the incision until instructed by your surgeon.  

## 2022-04-08 NOTE — Anesthesia Procedure Notes (Signed)
Spinal  Patient location during procedure: OR Start time: 04/08/2022 7:22 AM End time: 04/08/2022 7:27 AM Reason for block: surgical anesthesia Staffing Performed: anesthesiologist  Anesthesiologist: Josephine Igo, MD Performed by: Josephine Igo, MD Authorized by: Josephine Igo, MD   Preanesthetic Checklist Completed: patient identified, IV checked, site marked, risks and benefits discussed, surgical consent, monitors and equipment checked, pre-op evaluation and timeout performed Spinal Block Patient position: sitting Prep: DuraPrep and site prepped and draped Patient monitoring: heart rate, cardiac monitor, continuous pulse ox and blood pressure Approach: midline Location: L3-4 Injection technique: single-shot Needle Needle type: Pencan  Needle gauge: 24 G Needle length: 9 cm Needle insertion depth: 6 cm Assessment Sensory level: T8 Events: CSF return Additional Notes Patient tolerated procedure well. Adequate sensory level.Attempt x 3. Difficult due to previous back surgery.

## 2022-04-08 NOTE — Op Note (Signed)
OPERATIVE REPORT-TOTAL KNEE ARTHROPLASTY   Pre-operative diagnosis- Osteoarthritis  Left knee(s)  Post-operative diagnosis- Osteoarthritis Left knee(s)  Procedure-  Left  Total Knee Arthroplasty  Surgeon- Dione Plover. Cecely Rengel, MD  Assistant- [name], PA-C   Anesthesia-   Adductor canal block and spinal  EBL-30 mL   Drains None  Tourniquet time-  Total Tourniquet Time Documented: Thigh (Left) - 33 minutes Total: Thigh (Left) - 33 minutes     Complications- None  Condition-PACU - hemodynamically stable.   Brief Clinical Note  Rita Payne is a 77 y.o. year old female with end stage OA of her left knee with progressively worsening pain and dysfunction. She has constant pain, with activity and at rest and significant functional deficits with difficulties even with ADLs. She has had extensive non-op management including analgesics, injections of cortisone and viscosupplements, and home exercise program, but remains in significant pain with significant dysfunction. Radiographs show bone on bone arthritis medial and patellofemoral. She presents now for left Total Knee Arthroplasty.     Procedure in detail---   The patient is brought into the operating room and positioned supine on the operating table. After successful administration of  Adductor canal block and spinal,   a tourniquet is placed high on the  Left thigh(s) and the lower extremity is prepped and draped in the usual sterile fashion. Time out is performed by the operating team and then the  Left lower extremity is wrapped in Esmarch, knee flexed and the tourniquet inflated to 300 mmHg.       A midline incision is made with a ten blade through the subcutaneous tissue to the level of the extensor mechanism. A fresh blade is used to make a medial parapatellar arthrotomy. Soft tissue over the proximal medial tibia is subperiosteally elevated to the joint line with a knife and into the semimembranosus bursa with a Cobb elevator.  Soft tissue over the proximal lateral tibia is elevated with attention being paid to avoiding the patellar tendon on the tibial tubercle. The patella is everted, knee flexed 90 degrees and the ACL and PCL are removed. Findings are bone on bone medial and patellofemoral with large global osteophytes        The drill is used to create a starting hole in the distal femur and the canal is thoroughly irrigated with sterile saline to remove the fatty contents. The 5 degree Left  valgus alignment guide is placed into the femoral canal and the distal femoral cutting block is pinned to remove 9 mm off the distal femur. Resection is made with an oscillating saw.      The tibia is subluxed forward and the menisci are removed. The extramedullary alignment guide is placed referencing proximally at the medial aspect of the tibial tubercle and distally along the second metatarsal axis and tibial crest. The block is pinned to remove 58mm off the more deficient medial  side. Resection is made with an oscillating saw. Size 5is the most appropriate size for the tibia and the proximal tibia is prepared with the modular drill and keel punch for that size.      The femoral sizing guide is placed and size 5 is most appropriate. Rotation is marked off the epicondylar axis and confirmed by creating a rectangular flexion gap at 90 degrees. The size 5 cutting block is pinned in this rotation and the anterior, posterior and chamfer cuts are made with the oscillating saw. The intercondylar block is then placed and that cut is made.  Trial size 5 tibial component, trial size 5 posterior stabilized femur and a 7  mm posterior stabilized rotating platform insert trial is placed. Full extension is achieved with excellent varus/valgus and anterior/posterior balance throughout full range of motion. The patella is everted and thickness measured to be 24  mm. Free hand resection is taken to 14 mm, a 38 template is placed, lug holes are drilled,  trial patella is placed, and it tracks normally. Osteophytes are removed off the posterior femur with the trial in place. All trials are removed and the cut bone surfaces prepared with pulsatile lavage. Cement is mixed and once ready for implantation, the size 5 tibial implant, size  5 posterior stabilized femoral component, and the size 38 patella are cemented in place and the patella is held with the clamp. The trial insert is placed and the knee held in full extension. The Exparel (20 ml mixed with 60 ml saline) is injected into the extensor mechanism, posterior capsule, medial and lateral gutters and subcutaneous tissues.  All extruded cement is removed and once the cement is hard the permanent 7 mm posterior stabilized rotating platform insert is placed into the tibial tray.      The wound is copiously irrigated with saline solution and the extensor mechanism closed with # 0 Stratofix suture. The tourniquet is released for a total tourniquet time of 33  minutes. Flexion against gravity is 140 degrees and the patella tracks normally. Subcutaneous tissue is closed with 2.0 vicryl and subcuticular with running 4.0 Monocryl. The incision is cleaned and dried and steri-strips and a bulky sterile dressing are applied. The limb is placed into a knee immobilizer and the patient is awakened and transported to recovery in stable condition.      Please note that a surgical assistant was a medical necessity for this procedure in order to perform it in a safe and expeditious manner. Surgical assistant was necessary to retract the ligaments and vital neurovascular structures to prevent injury to them and also necessary for proper positioning of the limb to allow for anatomic placement of the prosthesis.   Dione Plover Pervis Macintyre, MD    04/08/2022, 8:23 AM

## 2022-04-08 NOTE — Anesthesia Postprocedure Evaluation (Signed)
Anesthesia Post Note  Patient: Rita Payne  Procedure(s) Performed: TOTAL KNEE ARTHROPLASTY (Left: Knee)     Patient location during evaluation: PACU Anesthesia Type: Spinal Level of consciousness: oriented and awake and alert Pain management: pain level controlled Vital Signs Assessment: post-procedure vital signs reviewed and stable Respiratory status: spontaneous breathing, respiratory function stable and nonlabored ventilation Cardiovascular status: blood pressure returned to baseline and stable Postop Assessment: no headache, no backache, no apparent nausea or vomiting and spinal receding Anesthetic complications: no   No notable events documented.  Last Vitals:  Vitals:   04/08/22 1000 04/08/22 1015  BP: 104/63 (!) 100/54  Pulse: 63 63  Resp: 14 19  Temp:    SpO2: 98% 97%    Last Pain:  Vitals:   04/08/22 0945  TempSrc:   PainSc: 0-No pain                 Zacariah Belue A.

## 2022-04-08 NOTE — Transfer of Care (Signed)
Immediate Anesthesia Transfer of Care Note  Patient: Rita Payne  Procedure(s) Performed: TOTAL KNEE ARTHROPLASTY (Left: Knee)  Patient Location: PACU  Anesthesia Type:Spinal  Level of Consciousness: sedated  Airway & Oxygen Therapy: Patient Spontanous Breathing and Patient connected to face mask oxygen  Post-op Assessment: Report given to RN and Post -op Vital signs reviewed and stable  Post vital signs: Reviewed and stable  Last Vitals:  Vitals Value Taken Time  BP    Temp    Pulse 76 04/08/22 0849  Resp 21 04/08/22 0849  SpO2 93 % 04/08/22 0849  Vitals shown include unvalidated device data.  Last Pain:  Vitals:   04/08/22 0547  TempSrc: Oral  PainSc:          Complications: No notable events documented.

## 2022-04-09 ENCOUNTER — Encounter (HOSPITAL_COMMUNITY): Payer: Self-pay | Admitting: Orthopedic Surgery

## 2022-04-09 DIAGNOSIS — E1122 Type 2 diabetes mellitus with diabetic chronic kidney disease: Secondary | ICD-10-CM | POA: Diagnosis not present

## 2022-04-09 DIAGNOSIS — I129 Hypertensive chronic kidney disease with stage 1 through stage 4 chronic kidney disease, or unspecified chronic kidney disease: Secondary | ICD-10-CM | POA: Diagnosis not present

## 2022-04-09 DIAGNOSIS — J45909 Unspecified asthma, uncomplicated: Secondary | ICD-10-CM | POA: Diagnosis not present

## 2022-04-09 DIAGNOSIS — Z85828 Personal history of other malignant neoplasm of skin: Secondary | ICD-10-CM | POA: Diagnosis not present

## 2022-04-09 DIAGNOSIS — M1712 Unilateral primary osteoarthritis, left knee: Secondary | ICD-10-CM | POA: Diagnosis not present

## 2022-04-09 DIAGNOSIS — N189 Chronic kidney disease, unspecified: Secondary | ICD-10-CM | POA: Diagnosis not present

## 2022-04-09 LAB — CBC
HCT: 36.5 % (ref 36.0–46.0)
Hemoglobin: 12.2 g/dL (ref 12.0–15.0)
MCH: 31.4 pg (ref 26.0–34.0)
MCHC: 33.4 g/dL (ref 30.0–36.0)
MCV: 94.1 fL (ref 80.0–100.0)
Platelets: 180 10*3/uL (ref 150–400)
RBC: 3.88 MIL/uL (ref 3.87–5.11)
RDW: 14.1 % (ref 11.5–15.5)
WBC: 14.6 10*3/uL — ABNORMAL HIGH (ref 4.0–10.5)
nRBC: 0 % (ref 0.0–0.2)

## 2022-04-09 LAB — BASIC METABOLIC PANEL
Anion gap: 8 (ref 5–15)
BUN: 19 mg/dL (ref 8–23)
CO2: 24 mmol/L (ref 22–32)
Calcium: 8.6 mg/dL — ABNORMAL LOW (ref 8.9–10.3)
Chloride: 106 mmol/L (ref 98–111)
Creatinine, Ser: 0.75 mg/dL (ref 0.44–1.00)
GFR, Estimated: >60 mL/min (ref >60)
Glucose, Bld: 129 mg/dL — ABNORMAL HIGH (ref 70–99)
Potassium: 3.9 mmol/L (ref 3.5–5.1)
Sodium: 138 mmol/L (ref 135–145)

## 2022-04-09 LAB — GLUCOSE, CAPILLARY
Glucose-Capillary: 123 mg/dL — ABNORMAL HIGH (ref 70–99)
Glucose-Capillary: 123 mg/dL — ABNORMAL HIGH (ref 70–99)

## 2022-04-09 MED ORDER — TRAMADOL HCL 50 MG PO TABS: 50.0000 mg | ORAL_TABLET | Freq: Four times a day (QID) | ORAL | 0 refills | Status: DC | PRN

## 2022-04-09 MED ORDER — ASPIRIN 81 MG PO CHEW: 81.0000 mg | CHEWABLE_TABLET | Freq: Two times a day (BID) | ORAL | 0 refills | Status: AC

## 2022-04-09 MED ORDER — METHOCARBAMOL 500 MG PO TABS: 500.0000 mg | ORAL_TABLET | Freq: Four times a day (QID) | ORAL | 0 refills | Status: DC | PRN

## 2022-04-09 MED ORDER — OXYCODONE HCL 5 MG PO TABS: 5.0000 mg | ORAL_TABLET | Freq: Four times a day (QID) | ORAL | 0 refills | Status: DC | PRN

## 2022-04-09 NOTE — Progress Notes (Signed)
Physical Therapy Treatment Patient Details Name: Munisa Roulo MRN: [id number] DOB: 22-Apr-1945 Today's Date: 04/09/2022   History of Present Illness 77 yo female s/p L TKA on 04/08/22, PMH: HTN, lumbar decompression L3-4, L4-5, DM, asthma.    PT Comments    Pt is progressing well, meeting PT goals. Pt is ready to d/c from PT standpoint. Pt is DTV and will need to be cleared by nursing for d/c as well.  Recommendations for follow up therapy are one component of a multi-disciplinary discharge planning process, led by the attending physician.  Recommendations may be updated based on patient status, additional functional criteria and insurance authorization.  Follow Up Recommendations       Assistance Recommended at Discharge Intermittent Supervision/Assistance  Patient can return home with the following A little help with walking and/or transfers;A little help with bathing/dressing/bathroom;Assistance with cooking/housework;Assist for transportation;Help with stairs or ramp for entrance   Equipment Recommendations  None recommended by PT    Recommendations for Other Services       Precautions / Restrictions Precautions Precautions: Fall;Knee Precaution Comments: IND SLRs Knee Immobilizer - Left: Discontinue once straight leg raise with < 10 degree lag Restrictions Weight Bearing Restrictions: No Other Position/Activity Restrictions: WBAT     Mobility  Bed Mobility Overal bed mobility: Needs Assistance Bed Mobility: Supine to Sit     Supine to sit: Supervision     General bed mobility comments: cues for safety, technique and self assist    Transfers Overall transfer level: Needs assistance Equipment used: Rolling walker (2 wheels) Transfers: Sit to/from Stand Sit to Stand: Min guard, Supervision           General transfer comment: cues for hand placement and safety    Ambulation/Gait Ambulation/Gait assistance: Min guard, Supervision Gait Distance (Feet): 60 Feet  (10' more) Assistive device: Rolling walker (2 wheels) Gait Pattern/deviations: Step-to pattern       General Gait Details: cues for sequence and RW position   Stairs             Wheelchair Mobility    Modified Rankin (Stroke Patients Only)       Balance                                            Cognition Arousal/Alertness: Awake/alert Behavior During Therapy: WFL for tasks assessed/performed Overall Cognitive Status: Within Functional Limits for tasks assessed                                          Exercises Total Joint Exercises Ankle Circles/Pumps: AROM, Both, 10 reps Quad Sets: AROM, Both, 10 reps Heel Slides: AAROM, Left, 10 reps, AROM Straight Leg Raises: AROM, Left, 10 reps    General Comments        Pertinent Vitals/Pain Pain Assessment Pain Assessment: 0-10 Pain Score: 5  Pain Location: L TKA Pain Descriptors / Indicators: Discomfort Pain Intervention(s): Limited activity within patient's tolerance, Monitored during session, Premedicated before session, Repositioned, Ice applied    Home Living                          Prior Function            PT Goals (current goals can now be  found in the care plan section) Acute Rehab PT Goals PT Goal Formulation: With patient Time For Goal Achievement: 04/15/22 Potential to Achieve Goals: Good Progress towards PT goals: Progressing toward goals    Frequency    7X/week      PT Plan Current plan remains appropriate    Co-evaluation              AM-PAC PT "6 Clicks" Mobility   Outcome Measure  Help needed turning from your back to your side while in a flat bed without using bedrails?: A Little Help needed moving from lying on your back to sitting on the side of a flat bed without using bedrails?: None Help needed moving to and from a bed to a chair (including a wheelchair)?: A Little Help needed standing up from a chair using your arms  (e.g., wheelchair or bedside chair)?: A Little Help needed to walk in hospital room?: A Little Help needed climbing 3-5 steps with a railing? : A Little 6 Click Score: 19    End of Session Equipment Utilized During Treatment: Gait belt Activity Tolerance: Patient tolerated treatment well Patient left: in chair;with call bell/phone within reach;with chair alarm set Nurse Communication: Mobility status PT Visit Diagnosis: Other abnormalities of gait and mobility (R26.89);Difficulty in walking, not elsewhere classified (R26.2)     Time: [id number] PT Time Calculation (min) (ACUTE ONLY): 21 min  Charges:  $Gait Training: 8-22 mins                     Baxter Flattery, PT  Acute Rehab Dept [facility]) [phone]  04/09/2022    [facility] 04/09/2022, 10:57 AM

## 2022-04-09 NOTE — TOC Transition Note (Signed)
Transition of Care [facility]) - CM/SW Discharge Note   Patient Details  Name: Rita Payne MRN: [id number] Date of Birth: [birth year]  Transition of Care [facility]) CM/SW Contact:  Lennart Pall, LCSW Phone Number: 04/09/2022, 9:55 AM   Clinical Narrative:     Met with pt and confirming she has needed DME at home.  OPPT already arranged with [facility].  No TOC needs.  Final next level of care: OP Rehab Barriers to Discharge: No Barriers Identified   Patient Goals and CMS Choice      Discharge Placement                         Discharge Plan and Services Additional resources added to the After Visit Summary for                  DME Arranged: N/A                    Social Determinants of Health (SDOH) Interventions SDOH Screenings   Food Insecurity: No Food Insecurity (04/08/2022)  Housing: Low Risk  (04/08/2022)  Transportation Needs: No Transportation Needs (04/08/2022)  Utilities: Not At Risk (04/08/2022)  Alcohol Screen: Low Risk  (06/22/2021)  Depression (PHQ2-9): Low Risk  (04/02/2022)  Financial Resource Strain: Low Risk  (06/22/2021)  Physical Activity: Insufficiently Active (06/22/2021)  Social Connections: Socially Isolated (06/22/2021)  Stress: No Stress Concern Present (06/22/2021)  Tobacco Use: Medium Risk (04/08/2022)     Readmission Risk Interventions     No data to display

## 2022-04-09 NOTE — Progress Notes (Signed)
Patient discharged to home w/ family. Given all belongings, instructions, equipment. Verbalized understanding of all instructions. Escorted to pov via w/c. 

## 2022-04-09 NOTE — Progress Notes (Signed)
Subjective: 1 Day Post-Op Procedure(s) (LRB): TOTAL KNEE ARTHROPLASTY (Left) Patient reports pain as mild.   Patient seen in rounds by Dr. Wynelle Link. Patient is well, and has had no acute complaints or problems No issues overnight. Denies chest pain, SOB, or calf pain. Foley catheter removed this AM.  We will begin therapy today  Objective: Vital signs in last 24 hours: Temp:  [97.4 F (36.3 C)-99 F (37.2 C)] 98.4 F (36.9 C) (04/02 0619) Pulse Rate:  [60-79] 70 (04/02 0619) Resp:  [14-19] 17 (04/02 0619) BP: (91-131)/(52-63) 124/54 (04/02 0619) SpO2:  [93 %-98 %] 96 % (04/02 0619)  Intake/Output from previous day:  Intake/Output Summary (Last 24 hours) at 04/09/2022 0817 Last data filed at 04/09/2022 0655 Gross per 24 hour  Intake 3302.47 ml  Output 2140 ml  Net 1162.47 ml     Intake/Output this shift: No intake/output data recorded.  Labs: Recent Labs    04/09/22 0329  HGB 12.2   Recent Labs    04/09/22 0329  WBC 14.6*  RBC 3.88  HCT 36.5  PLT 180   Recent Labs    04/09/22 0329  NA 138  K 3.9  CL 106  CO2 24  BUN 19  CREATININE 0.75  GLUCOSE 129*  CALCIUM 8.6*   No results for input(s): "LABPT", "INR" in the last 72 hours.  Exam: General - Patient is Alert and Oriented Extremity - Neurologically intact Neurovascular intact Sensation intact distally Dorsiflexion/Plantar flexion intact Dressing - dressing C/D/I Motor Function - intact, moving foot and toes well on exam.   Past Medical History:  Diagnosis Date   Allergy    Arthritis    Basal cell carcinoma    Cataract    Chronic kidney disease    left kidney tumor removed, benign   Diarrhea    First degree heart block    GERD (gastroesophageal reflux disease)    Hyperlipidemia    Hypertension    Leg fracture    Melanoma    Mild intermittent asthma    Neuromuscular disorder    neuropathy feet - mild   Pneumonia    SUI (stress urinary incontinence, female)    Type 2 diabetes  mellitus     Assessment/Plan: 1 Day Post-Op Procedure(s) (LRB): TOTAL KNEE ARTHROPLASTY (Left) Principal Problem:   OA (osteoarthritis) of knee Active Problems:   Primary osteoarthritis of left knee  Estimated body mass index is 29.85 kg/m as calculated from the following:   Height as of this encounter: 5' 4.5" (1.638 m).   Weight as of this encounter: 80.1 kg. Advance diet Up with therapy D/C IV fluids   Patient's anticipated LOS is less than 2 midnights, meeting these requirements: - Lives within 1 hour of care - Has a competent adult at home to recover with post-op recover - NO history of  - Chronic pain requiring opiods  - Coronary Artery Disease  - Heart failure  - Heart attack  - Stroke  - DVT/VTE  - Cardiac arrhythmia  - Respiratory Failure/COPD  - Renal failure  - Anemia  - Advanced Liver disease  DVT Prophylaxis - Aspirin Weight bearing as tolerated. Continue therapy.  Plan is to go Home after hospital stay. Plan for discharge later today if progresses with therapy and meeting goals. Scheduled for OPPT at [facility]. Follow-up in the office in 2 weeks.  The PDMP database was reviewed today prior to any opioid medications being prescribed to this patient.  Theresa Duty, PA-C Orthopedic Surgery (  [phone] 04/09/2022, 8:17 AM

## 2022-04-11 DIAGNOSIS — M6281 Muscle weakness (generalized): Secondary | ICD-10-CM | POA: Diagnosis not present

## 2022-04-11 DIAGNOSIS — M25562 Pain in left knee: Secondary | ICD-10-CM | POA: Diagnosis not present

## 2022-04-11 DIAGNOSIS — R262 Difficulty in walking, not elsewhere classified: Secondary | ICD-10-CM | POA: Diagnosis not present

## 2022-04-11 DIAGNOSIS — Z471 Aftercare following joint replacement surgery: Secondary | ICD-10-CM | POA: Diagnosis not present

## 2022-04-15 DIAGNOSIS — M25562 Pain in left knee: Secondary | ICD-10-CM | POA: Diagnosis not present

## 2022-04-15 DIAGNOSIS — M6281 Muscle weakness (generalized): Secondary | ICD-10-CM | POA: Diagnosis not present

## 2022-04-15 DIAGNOSIS — R262 Difficulty in walking, not elsewhere classified: Secondary | ICD-10-CM | POA: Diagnosis not present

## 2022-04-15 DIAGNOSIS — Z471 Aftercare following joint replacement surgery: Secondary | ICD-10-CM | POA: Diagnosis not present

## 2022-04-15 NOTE — Discharge Summary (Signed)
Patient ID: Rita Payne MRN: [id number] DOB/AGE: 1945-03-15 77 y.o.  Admit date: 04/08/2022 Discharge date: 04/09/2022  Admission Diagnoses:  Principal Problem:   OA (osteoarthritis) of knee Active Problems:   Primary osteoarthritis of left knee   Discharge Diagnoses:  Same  Past Medical History:  Diagnosis Date   Allergy    Arthritis    Basal cell carcinoma    Cataract    Chronic kidney disease    left kidney tumor removed, benign   Diarrhea    First degree heart block    GERD (gastroesophageal reflux disease)    Hyperlipidemia    Hypertension    Leg fracture    Melanoma    Mild intermittent asthma    Neuromuscular disorder    neuropathy feet - mild   Pneumonia    SUI (stress urinary incontinence, female)    Type 2 diabetes mellitus     Surgeries: Procedure(s): TOTAL KNEE ARTHROPLASTY on 04/08/2022   Consultants:   Discharged Condition: Improved  Hospital Course: Rita Payne is an 77 y.o. female who was admitted 04/08/2022 for operative treatment ofOA (osteoarthritis) of knee. Patient has severe unremitting pain that affects sleep, daily activities, and work/hobbies. After pre-op clearance the patient was taken to the operating room on 04/08/2022 and underwent  Procedure(s): TOTAL KNEE ARTHROPLASTY.    Patient was given perioperative antibiotics:  Anti-infectives (From admission, onward)    Start     Dose/Rate Route Frequency Ordered Stop   04/08/22 1400  ceFAZolin (ANCEF) IVPB 2g/100 mL premix        2 g 200 mL/hr over 30 Minutes Intravenous Every 6 hours 04/08/22 1118 04/08/22 2101   04/08/22 0600  ceFAZolin (ANCEF) IVPB 2g/100 mL premix        2 g 200 mL/hr over 30 Minutes Intravenous On call to O.R. 04/08/22 0532 04/08/22 7829        Patient was given sequential compression devices, early ambulation, and chemoprophylaxis to prevent DVT.  Patient benefited maximally from hospital stay and there were no complications.    Recent vital signs: No data found.    Recent laboratory studies: No results for input(s): "WBC", "HGB", "HCT", "PLT", "NA", "K", "CL", "CO2", "BUN", "CREATININE", "GLUCOSE", "INR", "CALCIUM" in the last 72 hours.  Invalid input(s): "PT", "2"   Discharge Medications:   Allergies as of 04/09/2022       Reactions   Ivp Dye [iodinated Contrast Media] Anaphylaxis   Shellfish Allergy Anaphylaxis   Vasotec [enalapril] Shortness Of Breath   Adhesive [tape] Other (See Comments)   blisters   Amlodipine Swelling   Gabapentin Other (See Comments)   drowsiness   Iodine Other (See Comments)   Iodine that is applied to skin causes blisters    Pyridium [phenazopyridine Hcl]    blisters        Medication List     STOP taking these medications    aspirin 81 MG tablet Replaced by: aspirin 81 MG chewable tablet   celecoxib 200 MG capsule Commonly known as: CELEBREX   diclofenac Sodium 1 % Gel Commonly known as: VOLTAREN       TAKE these medications    albuterol 108 (90 Base) MCG/ACT inhaler Commonly known as: VENTOLIN HFA USE 2 INHALATIONS EVERY 6 HOURS AS NEEDED FOR WHEEZING OR SHORTNESS OF BREATH, NEED FOR CHRONIC ASTHMA.   aspirin 81 MG chewable tablet Chew 1 tablet (81 mg total) by mouth 2 (two) times daily for 21 days. Then resume one 81 mg aspirin once a  day. Replaces: aspirin 81 MG tablet   atorvastatin 80 MG tablet Commonly known as: LIPITOR Take 1 tablet (80 mg total) by mouth daily.   cetirizine 10 MG tablet Commonly known as: ZYRTEC Take 1 tablet (10 mg total) by mouth daily.   cholecalciferol 1000 units tablet Commonly known as: VITAMIN D Take 1,000 Units by mouth daily.   cloNIDine 0.1 MG tablet Commonly known as: CATAPRES Take 1 tablet (0.1 mg total) by mouth 2 (two) times daily.   docusate sodium 100 MG capsule Commonly known as: Colace Take 1 capsule (100 mg total) by mouth 2 (two) times daily. To soften stool   empagliflozin 25 MG Tabs tablet Commonly known as: Jardiance Take 1  tablet (25 mg total) by mouth daily before breakfast.   fenofibrate 145 MG tablet Commonly known as: TRICOR Take 1 tablet (145 mg total) by mouth daily.   Fluticasone-Salmeterol 250-50 MCG/DOSE Aepb Commonly known as: Advair Diskus USE 1 INHALATION EVERY 12 HOURS   FREESTYLE LITE test strip Generic drug: glucose blood USE TO TEST DAILY   glimepiride 4 MG tablet Commonly known as: AMARYL Take 1 tablet (4 mg total) by mouth daily with breakfast.   MAGNESIUM PO Take 420 mg by mouth daily.   methocarbamol 500 MG tablet Commonly known as: ROBAXIN Take 1 tablet (500 mg total) by mouth every 6 (six) hours as needed for muscle spasms.   Micardis HCT 80-25 MG tablet Generic drug: telmisartan-hydrochlorothiazide TAKE 1 TABLET EVERY MORNING   omeprazole 40 MG capsule Commonly known as: PRILOSEC TAKE 1 CAPSULE DAILY   oxyCODONE 5 MG immediate release tablet Commonly known as: Oxy IR/ROXICODONE Take 1-2 tablets (5-10 mg total) by mouth every 6 (six) hours as needed for severe pain.   traMADol 50 MG tablet Commonly known as: ULTRAM Take 1-2 tablets (50-100 mg total) by mouth every 6 (six) hours as needed for moderate pain.   Trulicity 1.5 MG/0.5ML Sopn Generic drug: Dulaglutide Inject content of one pen under the skin weekly   Yuvafem 10 MCG Tabs vaginal tablet Generic drug: Estradiol INSERT 1 TABLET VAGINALLY TWICE A WEEK               Discharge Care Instructions  (From admission, onward)           Start     Ordered   04/09/22 0000  Weight bearing as tolerated        04/09/22 0821   04/09/22 0000  Change dressing       Comments: You may remove the bulky bandage (ACE wrap and gauze) two days after surgery. You will have an adhesive waterproof bandage underneath. Leave this in place until your first follow-up appointment.   04/09/22 [id number]            Diagnostic Studies: No results found.  Disposition: Discharge disposition: 01-Home or Self  Care       Discharge Instructions     Call MD / Call 911   Complete by: As directed    If you experience chest pain or shortness of breath, CALL 911 and be transported to the hospital emergency room.  If you develope a fever above 101 F, pus (white drainage) or increased drainage or redness at the wound, or calf pain, call your surgeon's office.   Change dressing   Complete by: As directed    You may remove the bulky bandage (ACE wrap and gauze) two days after surgery. You will have an adhesive waterproof bandage underneath. Leave this  in place until your first follow-up appointment.   Constipation Prevention   Complete by: As directed    Drink plenty of fluids.  Prune juice may be helpful.  You may use a stool softener, such as Colace (over the counter) 100 mg twice a day.  Use MiraLax (over the counter) for constipation as needed.   Diet - low sodium heart healthy   Complete by: As directed    Do not put a pillow under the knee. Place it under the heel.   Complete by: As directed    Driving restrictions   Complete by: As directed    No driving for two weeks   Post-operative opioid taper instructions:   Complete by: As directed    POST-OPERATIVE OPIOID TAPER INSTRUCTIONS: It is important to wean off of your opioid medication as soon as possible. If you do not need pain medication after your surgery it is ok to stop day one. Opioids include: Codeine, Hydrocodone(Norco, Vicodin), Oxycodone(Percocet, oxycontin) and hydromorphone amongst others.  Long term and even short term use of opiods can cause: Increased pain response Dependence Constipation Depression Respiratory depression And more.  Withdrawal symptoms can include Flu like symptoms Nausea, vomiting And more Techniques to manage these symptoms Hydrate well Eat regular healthy meals Stay active Use relaxation techniques(deep breathing, meditating, yoga) Do Not substitute Alcohol to help with tapering If you have  been on opioids for less than two weeks and do not have pain than it is ok to stop all together.  Plan to wean off of opioids This plan should start within one week post op of your joint replacement. Maintain the same interval or time between taking each dose and first decrease the dose.  Cut the total daily intake of opioids by one tablet each day Next start to increase the time between doses. The last dose that should be eliminated is the evening dose.      TED hose   Complete by: As directed    Use stockings (TED hose) for three weeks on both leg(s).  You may remove them at night for sleeping.   Weight bearing as tolerated   Complete by: As directed         Follow-up Information     Ollen Gross, MD. Go on 04/23/2022.   Specialty: Orthopedic Surgery Why: You are scheduled for first post op appt on Tuesday April 16 at 1:00pm. Contact information: [address] [place] [address] [place] [address] [phone]                  Signed: Arther Abbott 04/15/2022, 8:01 AM

## 2022-04-17 DIAGNOSIS — M25562 Pain in left knee: Secondary | ICD-10-CM | POA: Diagnosis not present

## 2022-04-17 DIAGNOSIS — R262 Difficulty in walking, not elsewhere classified: Secondary | ICD-10-CM | POA: Diagnosis not present

## 2022-04-17 DIAGNOSIS — M6281 Muscle weakness (generalized): Secondary | ICD-10-CM | POA: Diagnosis not present

## 2022-04-17 DIAGNOSIS — Z471 Aftercare following joint replacement surgery: Secondary | ICD-10-CM | POA: Diagnosis not present

## 2022-04-19 DIAGNOSIS — M6281 Muscle weakness (generalized): Secondary | ICD-10-CM | POA: Diagnosis not present

## 2022-04-19 DIAGNOSIS — Z471 Aftercare following joint replacement surgery: Secondary | ICD-10-CM | POA: Diagnosis not present

## 2022-04-19 DIAGNOSIS — M25562 Pain in left knee: Secondary | ICD-10-CM | POA: Diagnosis not present

## 2022-04-19 DIAGNOSIS — R262 Difficulty in walking, not elsewhere classified: Secondary | ICD-10-CM | POA: Diagnosis not present

## 2022-04-24 DIAGNOSIS — M25562 Pain in left knee: Secondary | ICD-10-CM | POA: Diagnosis not present

## 2022-04-24 DIAGNOSIS — Z471 Aftercare following joint replacement surgery: Secondary | ICD-10-CM | POA: Diagnosis not present

## 2022-04-24 DIAGNOSIS — M6281 Muscle weakness (generalized): Secondary | ICD-10-CM | POA: Diagnosis not present

## 2022-04-24 DIAGNOSIS — R262 Difficulty in walking, not elsewhere classified: Secondary | ICD-10-CM | POA: Diagnosis not present

## 2022-04-26 DIAGNOSIS — Z471 Aftercare following joint replacement surgery: Secondary | ICD-10-CM | POA: Diagnosis not present

## 2022-04-26 DIAGNOSIS — M6281 Muscle weakness (generalized): Secondary | ICD-10-CM | POA: Diagnosis not present

## 2022-04-26 DIAGNOSIS — R262 Difficulty in walking, not elsewhere classified: Secondary | ICD-10-CM | POA: Diagnosis not present

## 2022-04-26 DIAGNOSIS — M25562 Pain in left knee: Secondary | ICD-10-CM | POA: Diagnosis not present

## 2022-04-29 ENCOUNTER — Telehealth: Payer: Self-pay | Admitting: Family Medicine

## 2022-04-29 NOTE — Telephone Encounter (Signed)
Patient's blood pressure and blood sugars have been running low for the last week or so.  I scheduled an appointment with Dr. Darlyn Read tomorrow morning, 04/30/22, at 9:10 am.

## 2022-04-29 NOTE — Telephone Encounter (Signed)
Patient passed out last week on Wednesday 4/17, her blood sugar is all over the place and blood pressure is low, blood sugar this morning 140. Aware that she may need an appt. Please call back

## 2022-04-30 ENCOUNTER — Encounter: Payer: Self-pay | Admitting: Family Medicine

## 2022-04-30 ENCOUNTER — Ambulatory Visit (INDEPENDENT_AMBULATORY_CARE_PROVIDER_SITE_OTHER): Payer: Medicare Other | Admitting: Family Medicine

## 2022-04-30 VITALS — BP 104/63 | HR 120 | Temp 98.1°F | Ht 64.5 in | Wt 170.4 lb

## 2022-04-30 DIAGNOSIS — E1159 Type 2 diabetes mellitus with other circulatory complications: Secondary | ICD-10-CM | POA: Diagnosis not present

## 2022-04-30 DIAGNOSIS — Z471 Aftercare following joint replacement surgery: Secondary | ICD-10-CM | POA: Diagnosis not present

## 2022-04-30 DIAGNOSIS — R262 Difficulty in walking, not elsewhere classified: Secondary | ICD-10-CM | POA: Diagnosis not present

## 2022-04-30 DIAGNOSIS — I152 Hypertension secondary to endocrine disorders: Secondary | ICD-10-CM

## 2022-04-30 DIAGNOSIS — M25562 Pain in left knee: Secondary | ICD-10-CM | POA: Diagnosis not present

## 2022-04-30 DIAGNOSIS — E11649 Type 2 diabetes mellitus with hypoglycemia without coma: Secondary | ICD-10-CM | POA: Diagnosis not present

## 2022-04-30 DIAGNOSIS — R55 Syncope and collapse: Secondary | ICD-10-CM | POA: Diagnosis not present

## 2022-04-30 DIAGNOSIS — M6281 Muscle weakness (generalized): Secondary | ICD-10-CM | POA: Diagnosis not present

## 2022-04-30 MED ORDER — FLUTICASONE FUROATE-VILANTEROL 200-25 MCG/ACT IN AEPB: 1.0000 | INHALATION_SPRAY | Freq: Every day | RESPIRATORY_TRACT | 11 refills | Status: DC

## 2022-04-30 NOTE — Progress Notes (Signed)
Subjective:  Patient ID: Marg Macmaster, female    DOB: [birth year]  Age: 77 y.o. MRN: [id number]  CC: Diabetes and Hypotension   HPI Wylee Ogden presents for Had syncope 6 days ago. Glucose was 90 fasting. Had eaten breakfast, then took a shower. Larey Seat out in the floor for 2-3 minutes. Took 15 minutes to feel better.  Glucose lower since increased dose of Trulicity. Awakening in th night with hypoglycemic sx.   BP low so she isn't taking night Dose of catapres.     04/30/2022    9:03 AM 04/02/2022    8:09 AM 12/26/2021    8:17 AM  Depression screen PHQ 2/9  Decreased Interest 0 0 0  Down, Depressed, Hopeless 0 0 0  PHQ - 2 Score 0 0 0    History [place] has a past medical history of Allergy, Arthritis, Basal cell carcinoma, Cataract, Chronic kidney disease, Diarrhea, First degree heart block, GERD (gastroesophageal reflux disease), Hyperlipidemia, Hypertension, Leg fracture, Melanoma, Mild intermittent asthma, Neuromuscular disorder, Pneumonia, SUI (stress urinary incontinence, female), and Type 2 diabetes mellitus.   She has a past surgical history that includes Tonsillectomy; Abdominal hysterectomy (1987); Cholecystectomy (1989); Knee arthroscopy (Bilateral); Hernia repair; kidney tumor removal  (1986); Blepharoptosis repair (Bilateral, 2009); Total hip arthroplasty (Right, 08/25/2012); Lumbar laminectomy/decompression microdiscectomy (N/A, 12/08/2013); Pubovaginal sling (N/A, 10/03/2014); Replacement total hip w/  resurfacing implants (Right); Colonoscopy; Polypectomy; and Total knee arthroplasty (Left, 04/08/2022).   Her family history includes Arrhythmia in her brother; Arthritis/Rheumatoid in her brother and sister; Colon polyps in her sister and sister; Diabetes in her mother, sister, and sister; Heart attack in her brother and mother; Heart disease in her brother, father, mother, and sister; Heart failure in her brother and sister; Stroke in her father, mother, and sister; Vision loss in  her brother.She reports that she quit smoking about 5 weeks ago. Her smoking use included cigarettes. She has a 8.25 pack-year smoking history. She has never used smokeless tobacco. She reports that she does not drink alcohol and does not use drugs.    ROS Review of Systems  Constitutional: Negative.   HENT: Negative.    Eyes:  Negative for visual disturbance.  Respiratory:  Negative for shortness of breath.   Cardiovascular:  Negative for chest pain.  Gastrointestinal:  Negative for abdominal pain.  Musculoskeletal:  Negative for arthralgias.    Objective:  BP 104/63   Pulse (!) 120   Temp 98.1 F (36.7 C)   Ht 5' 4.5" (1.638 m)   Wt 170 lb 6.4 oz (77.3 kg)   LMP 04/14/1982   SpO2 95%   BMI 28.80 kg/m   BP Readings from Last 3 Encounters:  04/30/22 104/63  04/09/22 (!) 130/56  04/02/22 (!) 113/56    Wt Readings from Last 3 Encounters:  04/30/22 170 lb 6.4 oz (77.3 kg)  04/08/22 176 lb 9.6 oz (80.1 kg)  04/02/22 176 lb 9.6 oz (80.1 kg)     Physical Exam Constitutional:      General: She is not in acute distress.    Appearance: She is well-developed.  Cardiovascular:     Rate and Rhythm: Normal rate and regular rhythm.  Pulmonary:     Breath sounds: Normal breath sounds.  Musculoskeletal:        General: Normal range of motion.  Skin:    General: Skin is warm and dry.  Neurological:     Mental Status: She is alert and oriented to person, place, and time.  Assessment & Plan:   Theodora was seen today for diabetes and hypotension.  Diagnoses and all orders for this visit:  Type 2 diabetes mellitus with hypoglycemia without coma, without long-term current use of insulin  Hypertension associated with type 2 diabetes mellitus  Syncope, unspecified syncope type  Other orders -     fluticasone furoate-vilanterol (BREO ELLIPTA) 200-25 MCG/ACT AEPB; Inhale 1 puff into the lungs daily.       I have discontinued Tyia Prunty "Sandy"'s  empagliflozin, docusate sodium, cloNIDine, glimepiride, oxyCODONE, traMADol, and methocarbamol. I am also having her start on fluticasone furoate-vilanterol. Additionally, I am having her maintain her cholecalciferol, Fluticasone-Salmeterol, omeprazole, MAGNESIUM PO, cetirizine, atorvastatin, fenofibrate, Yuvafem, albuterol, Micardis HCT, FREESTYLE LITE, Trulicity, and aspirin.  Allergies as of 04/30/2022       Reactions   Ivp Dye [iodinated Contrast Media] Anaphylaxis   Shellfish Allergy Anaphylaxis   Vasotec [enalapril] Shortness Of Breath   Adhesive [tape] Other (See Comments)   blisters   Amlodipine Swelling   Gabapentin Other (See Comments)   drowsiness   Iodine Other (See Comments)   Iodine that is applied to skin causes blisters    Pyridium [phenazopyridine Hcl]    blisters        Medication List        Accurate as of April 30, 2022  6:02 PM. If you have any questions, ask your nurse or doctor.          STOP taking these medications    cloNIDine 0.1 MG tablet Commonly known as: CATAPRES Stopped by: Mechele Claude, MD   docusate sodium 100 MG capsule Commonly known as: Colace Stopped by: Mechele Claude, MD   empagliflozin 25 MG Tabs tablet Commonly known as: Jardiance Stopped by: Mechele Claude, MD   glimepiride 4 MG tablet Commonly known as: AMARYL Stopped by: Mechele Claude, MD   methocarbamol 500 MG tablet Commonly known as: ROBAXIN Stopped by: Mechele Claude, MD   oxyCODONE 5 MG immediate release tablet Commonly known as: Oxy IR/ROXICODONE Stopped by: Mechele Claude, MD   traMADol 50 MG tablet Commonly known as: ULTRAM Stopped by: Mechele Claude, MD       TAKE these medications    albuterol 108 (90 Base) MCG/ACT inhaler Commonly known as: VENTOLIN HFA USE 2 INHALATIONS EVERY 6 HOURS AS NEEDED FOR WHEEZING OR SHORTNESS OF BREATH, NEED FOR CHRONIC ASTHMA.   aspirin 81 MG chewable tablet Chew 1 tablet (81 mg total) by mouth 2 (two) times daily for  21 days. Then resume one 81 mg aspirin once a day.   atorvastatin 80 MG tablet Commonly known as: LIPITOR Take 1 tablet (80 mg total) by mouth daily.   cetirizine 10 MG tablet Commonly known as: ZYRTEC Take 1 tablet (10 mg total) by mouth daily.   cholecalciferol 1000 units tablet Commonly known as: VITAMIN D Take 1,000 Units by mouth daily.   fenofibrate 145 MG tablet Commonly known as: TRICOR Take 1 tablet (145 mg total) by mouth daily.   fluticasone furoate-vilanterol 200-25 MCG/ACT Aepb Commonly known as: Breo Ellipta Inhale 1 puff into the lungs daily. Started by: Mechele Claude, MD   Fluticasone-Salmeterol 250-50 MCG/DOSE Aepb Commonly known as: Advair Diskus USE 1 INHALATION EVERY 12 HOURS   FREESTYLE LITE test strip Generic drug: glucose blood USE TO TEST DAILY   MAGNESIUM PO Take 420 mg by mouth daily.   Micardis HCT 80-25 MG tablet Generic drug: telmisartan-hydrochlorothiazide TAKE 1 TABLET EVERY MORNING   omeprazole 40 MG capsule Commonly  known as: PRILOSEC TAKE 1 CAPSULE DAILY   Trulicity 1.5 MG/0.5ML Sopn Generic drug: Dulaglutide Inject content of one pen under the skin weekly   Yuvafem 10 MCG Tabs vaginal tablet Generic drug: Estradiol INSERT 1 TABLET VAGINALLY TWICE A WEEK       Resume Jardiance if fasting trend goes over 130.   Follow-up: Return in about 2 weeks (around 05/14/2022) for diabetes, hypertension.  Mechele Claude, M.D.

## 2022-05-02 DIAGNOSIS — R262 Difficulty in walking, not elsewhere classified: Secondary | ICD-10-CM | POA: Diagnosis not present

## 2022-05-02 DIAGNOSIS — M6281 Muscle weakness (generalized): Secondary | ICD-10-CM | POA: Diagnosis not present

## 2022-05-02 DIAGNOSIS — M25562 Pain in left knee: Secondary | ICD-10-CM | POA: Diagnosis not present

## 2022-05-02 DIAGNOSIS — Z471 Aftercare following joint replacement surgery: Secondary | ICD-10-CM | POA: Diagnosis not present

## 2022-05-06 DIAGNOSIS — Z471 Aftercare following joint replacement surgery: Secondary | ICD-10-CM | POA: Diagnosis not present

## 2022-05-06 DIAGNOSIS — E119 Type 2 diabetes mellitus without complications: Secondary | ICD-10-CM | POA: Diagnosis not present

## 2022-05-06 DIAGNOSIS — R262 Difficulty in walking, not elsewhere classified: Secondary | ICD-10-CM | POA: Diagnosis not present

## 2022-05-06 DIAGNOSIS — M6281 Muscle weakness (generalized): Secondary | ICD-10-CM | POA: Diagnosis not present

## 2022-05-06 DIAGNOSIS — M25562 Pain in left knee: Secondary | ICD-10-CM | POA: Diagnosis not present

## 2022-05-06 LAB — HM DIABETES EYE EXAM

## 2022-05-10 DIAGNOSIS — R262 Difficulty in walking, not elsewhere classified: Secondary | ICD-10-CM | POA: Diagnosis not present

## 2022-05-10 DIAGNOSIS — M25562 Pain in left knee: Secondary | ICD-10-CM | POA: Diagnosis not present

## 2022-05-10 DIAGNOSIS — Z471 Aftercare following joint replacement surgery: Secondary | ICD-10-CM | POA: Diagnosis not present

## 2022-05-10 DIAGNOSIS — M6281 Muscle weakness (generalized): Secondary | ICD-10-CM | POA: Diagnosis not present

## 2022-05-13 ENCOUNTER — Other Ambulatory Visit: Payer: Self-pay | Admitting: Family Medicine

## 2022-05-13 DIAGNOSIS — J4520 Mild intermittent asthma, uncomplicated: Secondary | ICD-10-CM

## 2022-05-14 ENCOUNTER — Other Ambulatory Visit: Payer: Self-pay | Admitting: *Deleted

## 2022-05-14 DIAGNOSIS — M6281 Muscle weakness (generalized): Secondary | ICD-10-CM | POA: Diagnosis not present

## 2022-05-14 DIAGNOSIS — R262 Difficulty in walking, not elsewhere classified: Secondary | ICD-10-CM | POA: Diagnosis not present

## 2022-05-14 DIAGNOSIS — M25562 Pain in left knee: Secondary | ICD-10-CM | POA: Diagnosis not present

## 2022-05-14 DIAGNOSIS — Z471 Aftercare following joint replacement surgery: Secondary | ICD-10-CM | POA: Diagnosis not present

## 2022-05-14 MED ORDER — TRULICITY 1.5 MG/0.5ML ~~LOC~~ SOAJ: SUBCUTANEOUS | 1 refills | Status: DC

## 2022-05-15 ENCOUNTER — Encounter: Payer: Self-pay | Admitting: Family Medicine

## 2022-05-15 ENCOUNTER — Ambulatory Visit (INDEPENDENT_AMBULATORY_CARE_PROVIDER_SITE_OTHER): Payer: Medicare Other | Admitting: Family Medicine

## 2022-05-15 ENCOUNTER — Ambulatory Visit (INDEPENDENT_AMBULATORY_CARE_PROVIDER_SITE_OTHER): Payer: Medicare Other

## 2022-05-15 VITALS — BP 111/60 | HR 87 | Temp 98.0°F | Ht 64.5 in | Wt 169.4 lb

## 2022-05-15 DIAGNOSIS — Z7985 Long-term (current) use of injectable non-insulin antidiabetic drugs: Secondary | ICD-10-CM | POA: Diagnosis not present

## 2022-05-15 DIAGNOSIS — Z780 Asymptomatic menopausal state: Secondary | ICD-10-CM | POA: Diagnosis not present

## 2022-05-15 DIAGNOSIS — E119 Type 2 diabetes mellitus without complications: Secondary | ICD-10-CM | POA: Diagnosis not present

## 2022-05-15 DIAGNOSIS — M85852 Other specified disorders of bone density and structure, left thigh: Secondary | ICD-10-CM | POA: Diagnosis not present

## 2022-05-15 MED ORDER — FREESTYLE LITE TEST VI STRP: ORAL_STRIP | 3 refills | Status: DC

## 2022-05-15 NOTE — Progress Notes (Addendum)
Subjective:  Patient ID: Rita Payne, female    DOB: 11/04/1945  Age: 77 y.o. MRN: [id number]  CC: Follow-up   HPI Rita Payne presents for no further syncope or hypoglycemia. BP running 120s/60s at home. GLUCOSE 100-140. Feeling far better off of the meds that wee Dced than she had while taking them.     05/15/2022    2:15 PM 04/30/2022    9:03 AM 04/02/2022    8:09 AM  Depression screen PHQ 2/9  Decreased Interest 0 0 0  Down, Depressed, Hopeless 0 0 0  PHQ - 2 Score 0 0 0    History Rita Payne has a past medical history of Allergy, Arthritis, Basal cell carcinoma, Cataract, Chronic kidney disease, Diarrhea, First degree heart block, GERD (gastroesophageal reflux disease), Hyperlipidemia, Hypertension, Leg fracture, Melanoma (HCC), Mild intermittent asthma, Neuromuscular disorder (HCC), Pneumonia, SUI (stress urinary incontinence, female), and Type 2 diabetes mellitus (HCC).   She has a past surgical history that includes Tonsillectomy; Abdominal hysterectomy (1987); Cholecystectomy (1989); Knee arthroscopy (Bilateral); Hernia repair; kidney tumor removal  (1986); Blepharoptosis repair (Bilateral, 2009); Total hip arthroplasty (Right, 08/25/2012); Lumbar laminectomy/decompression microdiscectomy (N/A, 12/08/2013); Pubovaginal sling (N/A, 10/03/2014); Replacement total hip w/  resurfacing implants (Right); Colonoscopy; Polypectomy; and Total knee arthroplasty (Left, 04/08/2022).   Her family history includes Arrhythmia in her brother; Arthritis/Rheumatoid in her brother and sister; Colon polyps in her sister and sister; Diabetes in her mother, sister, and sister; Heart attack in her brother and mother; Heart disease in her brother, father, mother, and sister; Heart failure in her brother and sister; Stroke in her father, mother, and sister; Vision loss in her brother.She reports that she quit smoking about 8 weeks ago. Her smoking use included cigarettes. She has a 8.25 pack-year smoking history.  She has never used smokeless tobacco. She reports that she does not drink alcohol and does not use drugs.    ROS Review of Systems  Constitutional: Negative.   HENT: Negative.    Eyes:  Negative for visual disturbance.  Respiratory:  Negative for shortness of breath.   Cardiovascular:  Negative for chest pain.  Gastrointestinal:  Negative for abdominal pain.  Musculoskeletal:  Negative for arthralgias.    Objective:  BP 111/60   Pulse 87   Temp 98 F (36.7 C)   Ht 5' 4.5" (1.638 m)   Wt 169 lb 6.4 oz (76.8 kg)   LMP 04/14/1982   SpO2 94%   BMI 28.63 kg/m   BP Readings from Last 3 Encounters:  05/15/22 111/60  04/30/22 104/63  04/09/22 (!) 130/56    Wt Readings from Last 3 Encounters:  05/15/22 169 lb 6.4 oz (76.8 kg)  04/30/22 170 lb 6.4 oz (77.3 kg)  04/08/22 176 lb 9.6 oz (80.1 kg)     Physical Exam Constitutional:      General: She is not in acute distress.    Appearance: She is well-developed.  Cardiovascular:     Rate and Rhythm: Normal rate and regular rhythm.  Pulmonary:     Breath sounds: Normal breath sounds.  Musculoskeletal:        General: Normal range of motion.  Skin:    General: Skin is warm and dry.  Neurological:     Mental Status: She is alert and oriented to person, place, and time.       Assessment & Plan:   Rita Payne was seen today for follow-up.  Diagnoses and all orders for this visit:  Postmenopause -  DG Bone Density; Future  Type 2 diabetes mellitus without complication, without long-term current use of insulin (HCC) -     Discontinue: glucose blood (FREESTYLE LITE) test strip; Use as instructed -     glucose blood (FREESTYLE LITE) test strip; Use as instructed       I have discontinued Rita Eppes "Sandy"'s fluticasone furoate-vilanterol. I am also having her maintain her cholecalciferol, Fluticasone-Salmeterol, omeprazole, MAGNESIUM PO, cetirizine, atorvastatin, fenofibrate, Yuvafem, Micardis HCT, albuterol,  Trulicity, and FREESTYLE LITE.  Allergies as of 05/15/2022       Reactions   Ivp Dye [iodinated Contrast Media] Anaphylaxis   Shellfish Allergy Anaphylaxis   Vasotec [enalapril] Shortness Of Breath   Adhesive [tape] Other (See Comments)   blisters   Amlodipine Swelling   Gabapentin Other (See Comments)   drowsiness   Iodine Other (See Comments)   Iodine that is applied to skin causes blisters    Pyridium [phenazopyridine Hcl]    blisters        Medication List        Accurate as of May 15, 2022  3:19 PM. If you have any questions, ask your nurse or doctor.          STOP taking these medications    fluticasone furoate-vilanterol 200-25 MCG/ACT Aepb Commonly known as: Breo Ellipta Stopped by: Mechele Claude, MD       TAKE these medications    albuterol 108 (90 Base) MCG/ACT inhaler Commonly known as: VENTOLIN HFA USE 2 INHALATIONS EVERY 6 HOURS AS NEEDED FOR WHEEZING OR SHORTNESS OF BREATH, NEED FOR CHRONIC ASTHMA.   atorvastatin 80 MG tablet Commonly known as: LIPITOR Take 1 tablet (80 mg total) by mouth daily.   cetirizine 10 MG tablet Commonly known as: ZYRTEC Take 1 tablet (10 mg total) by mouth daily.   cholecalciferol 1000 units tablet Commonly known as: VITAMIN D Take 1,000 Units by mouth daily.   fenofibrate 145 MG tablet Commonly known as: TRICOR Take 1 tablet (145 mg total) by mouth daily.   Fluticasone-Salmeterol 250-50 MCG/DOSE Aepb Commonly known as: Advair Diskus USE 1 INHALATION EVERY 12 HOURS   FREESTYLE LITE test strip Generic drug: glucose blood Use as instructed What changed: See the new instructions. Changed by: Mechele Claude, MD   MAGNESIUM PO Take 420 mg by mouth daily.   Micardis HCT 80-25 MG tablet Generic drug: telmisartan-hydrochlorothiazide TAKE 1 TABLET EVERY MORNING   omeprazole 40 MG capsule Commonly known as: PRILOSEC TAKE 1 CAPSULE DAILY   Trulicity 1.5 MG/0.5ML Sopn Generic drug: Dulaglutide Inject content  of one pen under the skin weekly   Yuvafem 10 MCG Tabs vaginal tablet Generic drug: Estradiol INSERT 1 TABLET VAGINALLY TWICE A WEEK       Stay off of the meds that were Dced. None need to be renewed.  Follow-up: Return in about 6 weeks (around 06/26/2022).  Mechele Claude, M.D.

## 2022-05-15 NOTE — Addendum Note (Signed)
Addended by: Mechele Claude on: 05/15/2022 03:19 PM   Modules accepted: Orders

## 2022-05-20 DIAGNOSIS — M6281 Muscle weakness (generalized): Secondary | ICD-10-CM | POA: Diagnosis not present

## 2022-05-20 DIAGNOSIS — R262 Difficulty in walking, not elsewhere classified: Secondary | ICD-10-CM | POA: Diagnosis not present

## 2022-05-20 DIAGNOSIS — M25562 Pain in left knee: Secondary | ICD-10-CM | POA: Diagnosis not present

## 2022-05-20 DIAGNOSIS — Z471 Aftercare following joint replacement surgery: Secondary | ICD-10-CM | POA: Diagnosis not present

## 2022-05-21 DIAGNOSIS — Z5189 Encounter for other specified aftercare: Secondary | ICD-10-CM | POA: Diagnosis not present

## 2022-05-23 ENCOUNTER — Other Ambulatory Visit: Payer: Self-pay | Admitting: Family Medicine

## 2022-05-23 DIAGNOSIS — E119 Type 2 diabetes mellitus without complications: Secondary | ICD-10-CM

## 2022-05-23 DIAGNOSIS — Z780 Asymptomatic menopausal state: Secondary | ICD-10-CM

## 2022-05-24 NOTE — Progress Notes (Signed)
DEXA shows osteopenia. I recommend weekly fosamax. ?Nurse, if pt. Is agreeable, send in Fosamax 70 mg weekly, #13. ? ?Thanks, ?WS ?

## 2022-05-27 MED ORDER — TRULICITY 1.5 MG/0.5ML ~~LOC~~ SOAJ: SUBCUTANEOUS | 0 refills | Status: DC

## 2022-05-27 MED ORDER — ALENDRONATE SODIUM 70 MG PO TABS: 70.0000 mg | ORAL_TABLET | ORAL | 0 refills | Status: DC

## 2022-05-30 ENCOUNTER — Other Ambulatory Visit: Payer: Self-pay

## 2022-05-30 MED ORDER — TRULICITY 1.5 MG/0.5ML ~~LOC~~ SOAJ: SUBCUTANEOUS | 0 refills | Status: DC

## 2022-06-10 ENCOUNTER — Other Ambulatory Visit: Payer: Self-pay | Admitting: Family Medicine

## 2022-06-10 DIAGNOSIS — I10 Essential (primary) hypertension: Secondary | ICD-10-CM

## 2022-06-12 ENCOUNTER — Other Ambulatory Visit: Payer: Self-pay | Admitting: Family Medicine

## 2022-06-12 DIAGNOSIS — K219 Gastro-esophageal reflux disease without esophagitis: Secondary | ICD-10-CM

## 2022-07-01 ENCOUNTER — Ambulatory Visit (INDEPENDENT_AMBULATORY_CARE_PROVIDER_SITE_OTHER): Payer: Medicare Other | Admitting: Nurse Practitioner

## 2022-07-01 DIAGNOSIS — R300 Dysuria: Secondary | ICD-10-CM

## 2022-07-01 DIAGNOSIS — N3001 Acute cystitis with hematuria: Secondary | ICD-10-CM

## 2022-07-01 LAB — URINALYSIS, COMPLETE
Bilirubin, UA: NEGATIVE
Glucose, UA: NEGATIVE
Ketones, UA: NEGATIVE
Nitrite, UA: POSITIVE — AB
Protein,UA: NEGATIVE
Specific Gravity, UA: 1.02 (ref 1.005–1.030)
Urobilinogen, Ur: 0.2 mg/dL (ref 0.2–1.0)
pH, UA: 5.5 (ref 5.0–7.5)

## 2022-07-01 LAB — MICROSCOPIC EXAMINATION: RBC, Urine: NONE SEEN /hpf (ref 0–2)

## 2022-07-01 MED ORDER — UROGESIC-BLUE 81.6 MG PO TABS: 1.0000 | ORAL_TABLET | Freq: Four times a day (QID) | ORAL | 1 refills | Status: DC | PRN

## 2022-07-01 MED ORDER — CEPHALEXIN 500 MG PO CAPS: 500.0000 mg | ORAL_CAPSULE | Freq: Two times a day (BID) | ORAL | 0 refills | Status: DC

## 2022-07-01 NOTE — Progress Notes (Signed)
Virtual Visit Consent   Rita Payne, you are scheduled for a virtual visit with Mary-Margaret Daphine Deutscher, FNP, a [facility] provider, today.     Just as with appointments in the office, your consent must be obtained to participate.  Your consent will be active for this visit and any virtual visit you may have with one of our providers in the next 365 days.     If you have a MyChart account, a copy of this consent can be sent to you electronically.  All virtual visits are billed to your insurance company just like a traditional visit in the office.    As this is a virtual visit, video technology does not allow for your provider to perform a traditional examination.  This may limit your provider's ability to fully assess your condition.  If your provider identifies any concerns that need to be evaluated in person or the need to arrange testing (such as labs, EKG, etc.), we will make arrangements to do so.     Although advances in technology are sophisticated, we cannot ensure that it will always work on either your end or our end.  If the connection with a video visit is poor, the visit may have to be switched to a telephone visit.  With either a video or telephone visit, we are not always able to ensure that we have a secure connection.     I need to obtain your verbal consent now.   Are you willing to proceed with your visit today? YES   Rita Payne has provided verbal consent on 07/01/2022 for a virtual visit (video or telephone).   Mary-Margaret Daphine Deutscher, FNP   Date: 07/01/2022 12:01 PM   Virtual Visit via Video Note   I, Mary-Margaret Daphine Deutscher, connected with Rita Payne ([id number], 1945/10/26) on 07/01/22 at  5:15 PM EDT by a video-enabled telemedicine application and verified that I am speaking with the correct person using two identifiers.  Location: Patient: Virtual Visit Location Patient: Home Provider: Virtual Visit Location Provider: Mobile   I discussed the limitations of  evaluation and management by telemedicine and the availability of in person appointments. The patient expressed understanding and agreed to proceed.    History of Present Illness: Rita Payne is a 77 y.o. who identifies as a female who was assigned female at birth, and is being seen today for dysuria.  HPI: Urinary Tract Infection  This is a new problem. The current episode started yesterday. The problem occurs every urination. The problem has been waxing and waning. The quality of the pain is described as burning. The pain is at a severity of 6/10. The pain is mild. There has been no fever. The fever has been present for Less than 1 day. She is Not sexually active. There is No history of pyelonephritis. Associated symptoms include frequency and urgency. Pertinent negatives include no flank pain. She has tried nothing for the symptoms. The treatment provided mild relief.    Review of Systems  Genitourinary:  Positive for frequency and urgency. Negative for flank pain.    Problems:  Patient Active Problem List   Diagnosis Date Noted   Primary osteoarthritis of left knee 04/08/2022   Obesity (BMI 30.0-34.9) 11/30/2018   Post-menopausal 11/30/2018   OA (osteoarthritis) of knee 04/13/2017   Vitamin D deficiency 04/23/2016   Gastroesophageal reflux disease without esophagitis 03/23/2015   Smoker 02/17/2014   BMI 33.0-33.9,adult 02/17/2014   Spinal stenosis of lumbar region at multiple levels 12/08/2013  Senile osteoporosis 06/23/2013   S/P right THA, AA 08/25/2012   Diabetes (HCC) 03/12/2012   Asthma, chronic 03/12/2012   Hypertension associated with type 2 diabetes mellitus (HCC) 03/12/2012   Hyperlipidemia associated with type 2 diabetes mellitus (HCC) 03/12/2012   Arthritis 03/12/2012    Allergies:  Allergies  Allergen Reactions   Ivp Dye [Iodinated Contrast Media] Anaphylaxis   Shellfish Allergy Anaphylaxis   Vasotec [Enalapril] Shortness Of Breath   Adhesive [Tape] Other (See  Comments)    blisters   Amlodipine Swelling   Gabapentin Other (See Comments)    drowsiness   Iodine Other (See Comments)    Iodine that is applied to skin causes blisters    Pyridium [Phenazopyridine Hcl]     blisters   Medications:  Current Outpatient Medications:    albuterol (VENTOLIN HFA) 108 (90 Base) MCG/ACT inhaler, USE 2 INHALATIONS EVERY 6 HOURS AS NEEDED FOR WHEEZING OR SHORTNESS OF BREATH, NEED FOR CHRONIC ASTHMA., Disp: 20.1 g, Rfl: 0   alendronate (FOSAMAX) 70 MG tablet, Take 1 tablet (70 mg total) by mouth every 7 (seven) days. Take with a full glass of water on an empty stomach., Disp: 13 tablet, Rfl: 0   atorvastatin (LIPITOR) 80 MG tablet, Take 1 tablet (80 mg total) by mouth daily., Disp: 90 tablet, Rfl: 2   cetirizine (ZYRTEC) 10 MG tablet, Take 1 tablet (10 mg total) by mouth daily., Disp: 90 tablet, Rfl: 3   cholecalciferol (VITAMIN D) 1000 UNITS tablet, Take 1,000 Units by mouth daily., Disp: , Rfl:    Dulaglutide (TRULICITY) 1.5 MG/0.5ML SOPN, Inject content of one pen under the skin weekly, Disp: 6 mL, Rfl: 0   Estradiol (YUVAFEM) 10 MCG TABS vaginal tablet, INSERT 1 TABLET VAGINALLY TWICE A WEEK, Disp: 24 tablet, Rfl: 1   fenofibrate (TRICOR) 145 MG tablet, Take 1 tablet (145 mg total) by mouth daily., Disp: 90 tablet, Rfl: 1   Fluticasone-Salmeterol (ADVAIR DISKUS) 250-50 MCG/DOSE AEPB, USE 1 INHALATION EVERY 12 HOURS, Disp: 180 each, Rfl: 3   glucose blood (FREESTYLE LITE) test strip, Use as instructed, Disp: 200 strip, Rfl: 3   MAGNESIUM PO, Take 420 mg by mouth daily., Disp: , Rfl:    omeprazole (PRILOSEC) 40 MG capsule, TAKE 1 CAPSULE DAILY, Disp: 90 capsule, Rfl: 0   telmisartan-hydrochlorothiazide (MICARDIS HCT) 80-25 MG tablet, TAKE 1 TABLET EVERY MORNING, Disp: 90 tablet, Rfl: 0 No current facility-administered medications for this visit.  Facility-Administered Medications Ordered in Other Visits:    magnesium citrate solution 1 Bottle, 1 Bottle, Oral,  Once, Jethro Bolus, MD   sodium phosphate (FLEET) 7-19 GM/118ML enema 1 enema, 1 enema, Rectal, Once, Jethro Bolus, MD  Observations/Objective: Patient is well-developed, well-nourished in no acute distress.  Resting comfortably  at home.  Head is normocephalic, atraumatic.  No labored breathing.  Speech is clear and coherent with logical content.  Patient is alert and oriented at baseline.  No suprapubic pain on palpation  Assessment and Plan:  Rita Payne in today with chief complaint of Urinary Tract Infection   1. Dysuria  - Urinalysis, Complete - Urine Culture  2. Acute cystitis with hematuria Take medication as prescribe Cotton underwear Take shower not bath Cranberry juice, yogurt Force fluids AZO over the counter X2 days Culture pending RTO prn  Meds ordered this encounter  Medications   cephALEXin (KEFLEX) 500 MG capsule    Sig: Take 1 capsule (500 mg total) by mouth 2 (two) times daily.    Dispense:  14  capsule    Refill:  0    Order Specific Question:   Supervising Provider    Answer:   Arville Care A [[id number]]   Methen-Hyosc-Meth Blue-Na Phos (UROGESIC-BLUE) 81.6 MG TABS    Sig: Take 1 tablet (81.6 mg total) by mouth 4 (four) times daily as needed.    Dispense:  20 tablet    Refill:  1    Order Specific Question:   Supervising Provider    Answer:   Arville Care A [[id number]]     Follow Up Instructions: I discussed the assessment and treatment plan with the patient. The patient was provided an opportunity to ask questions and all were answered. The patient agreed with the plan and demonstrated an understanding of the instructions.  A copy of instructions were sent to the patient via MyChart.  The patient was advised to call back or seek an in-person evaluation if the symptoms worsen or if the condition fails to improve as anticipated.  Time:  I spent 7 minutes with the patient via telehealth technology discussing the above  problems/concerns.    Mary-Margaret Daphine Deutscher, FNP

## 2022-07-01 NOTE — Patient Instructions (Signed)
Amada Kingfisher, thank you for joining Bennie Pierini, FNP for today's virtual visit.  While this provider is not your primary care provider (PCP), if your PCP is located in our provider database this encounter information will be shared with them immediately following your visit.   A [facility] MyChart account gives you access to today's visit and all your visits, tests, and labs performed at [facility] " click here if you don't have a [facility] MyChart account or go to [email or web address]  Consent: (Patient) Rita Payne provided verbal consent for this virtual visit at the beginning of the encounter.  Current Medications:  Current Outpatient Medications:    cephALEXin (KEFLEX) 500 MG capsule, Take 1 capsule (500 mg total) by mouth 2 (two) times daily., Disp: 14 capsule, Rfl: 0   albuterol (VENTOLIN HFA) 108 (90 Base) MCG/ACT inhaler, USE 2 INHALATIONS EVERY 6 HOURS AS NEEDED FOR WHEEZING OR SHORTNESS OF BREATH, NEED FOR CHRONIC ASTHMA., Disp: 20.1 g, Rfl: 0   alendronate (FOSAMAX) 70 MG tablet, Take 1 tablet (70 mg total) by mouth every 7 (seven) days. Take with a full glass of water on an empty stomach., Disp: 13 tablet, Rfl: 0   atorvastatin (LIPITOR) 80 MG tablet, Take 1 tablet (80 mg total) by mouth daily., Disp: 90 tablet, Rfl: 2   cetirizine (ZYRTEC) 10 MG tablet, Take 1 tablet (10 mg total) by mouth daily., Disp: 90 tablet, Rfl: 3   cholecalciferol (VITAMIN D) 1000 UNITS tablet, Take 1,000 Units by mouth daily., Disp: , Rfl:    Dulaglutide (TRULICITY) 1.5 MG/0.5ML SOPN, Inject content of one pen under the skin weekly, Disp: 6 mL, Rfl: 0   Estradiol (YUVAFEM) 10 MCG TABS vaginal tablet, INSERT 1 TABLET VAGINALLY TWICE A WEEK, Disp: 24 tablet, Rfl: 1   fenofibrate (TRICOR) 145 MG tablet, Take 1 tablet (145 mg total) by mouth daily., Disp: 90 tablet, Rfl: 1   Fluticasone-Salmeterol (ADVAIR DISKUS) 250-50 MCG/DOSE AEPB, USE 1 INHALATION EVERY 12 HOURS, Disp: 180  each, Rfl: 3   glucose blood (FREESTYLE LITE) test strip, Use as instructed, Disp: 200 strip, Rfl: 3   MAGNESIUM PO, Take 420 mg by mouth daily., Disp: , Rfl:    Methen-Hyosc-Meth Blue-Na Phos (UROGESIC-BLUE) 81.6 MG TABS, Take 1 tablet (81.6 mg total) by mouth 4 (four) times daily as needed., Disp: 20 tablet, Rfl: 1   omeprazole (PRILOSEC) 40 MG capsule, TAKE 1 CAPSULE DAILY, Disp: 90 capsule, Rfl: 0   telmisartan-hydrochlorothiazide (MICARDIS HCT) 80-25 MG tablet, TAKE 1 TABLET EVERY MORNING, Disp: 90 tablet, Rfl: 0 No current facility-administered medications for this visit.  Facility-Administered Medications Ordered in Other Visits:    magnesium citrate solution 1 Bottle, 1 Bottle, Oral, Once, Jethro Bolus, MD   sodium phosphate (FLEET) 7-19 GM/118ML enema 1 enema, 1 enema, Rectal, Once, Jethro Bolus, MD   Medications ordered in this encounter:  Meds ordered this encounter  Medications   cephALEXin (KEFLEX) 500 MG capsule    Sig: Take 1 capsule (500 mg total) by mouth 2 (two) times daily.    Dispense:  14 capsule    Refill:  0    Order Specific Question:   Supervising Provider    Answer:   Arville Care A [[id number]]   Methen-Hyosc-Meth Blue-Na Phos (UROGESIC-BLUE) 81.6 MG TABS    Sig: Take 1 tablet (81.6 mg total) by mouth 4 (four) times daily as needed.    Dispense:  20 tablet    Refill:  1    Order Specific Question:  Supervising Provider    Answer:   Nils Pyle [[id number]]     *If you need refills on other medications prior to your next appointment, please contact your pharmacy*  Follow-Up: Call back or seek an in-person evaluation if the symptoms worsen or if the condition fails to improve as anticipated.  [facility] [phone]  Other Instructions Take medication as prescribe Cotton underwear Take shower not bath Cranberry juice, yogurt Force fluids AZO over the counter X2 days Culture pending RTO prn    If you have been  instructed to have an in-person evaluation today at a local Urgent Care facility, please use the link below. It will take you to a list of all of our available [facility], including address, phone number and hours of operation. Please do not delay care.  [facility]  If you or a family member do not have a primary care provider, use the link below to schedule a visit and establish care. When you choose a [facility] primary care physician or advanced practice provider, you gain a long-term partner in health. Find a Primary Care Provider  Learn more about [facility]'s in-office and virtual care options:  - Get Care Now

## 2022-07-03 ENCOUNTER — Encounter: Payer: Self-pay | Admitting: Family Medicine

## 2022-07-03 ENCOUNTER — Ambulatory Visit (INDEPENDENT_AMBULATORY_CARE_PROVIDER_SITE_OTHER): Payer: Medicare Other | Admitting: Family Medicine

## 2022-07-03 ENCOUNTER — Ambulatory Visit (INDEPENDENT_AMBULATORY_CARE_PROVIDER_SITE_OTHER): Payer: Medicare Other

## 2022-07-03 VITALS — BP 98/56 | HR 84 | Temp 97.7°F | Ht 64.25 in | Wt 174.2 lb

## 2022-07-03 VITALS — Ht 64.0 in | Wt 175.0 lb

## 2022-07-03 DIAGNOSIS — Z0000 Encounter for general adult medical examination without abnormal findings: Secondary | ICD-10-CM | POA: Diagnosis not present

## 2022-07-03 DIAGNOSIS — E1169 Type 2 diabetes mellitus with other specified complication: Secondary | ICD-10-CM | POA: Diagnosis not present

## 2022-07-03 DIAGNOSIS — I152 Hypertension secondary to endocrine disorders: Secondary | ICD-10-CM | POA: Diagnosis not present

## 2022-07-03 DIAGNOSIS — Z7985 Long-term (current) use of injectable non-insulin antidiabetic drugs: Secondary | ICD-10-CM | POA: Diagnosis not present

## 2022-07-03 DIAGNOSIS — E1159 Type 2 diabetes mellitus with other circulatory complications: Secondary | ICD-10-CM | POA: Diagnosis not present

## 2022-07-03 DIAGNOSIS — I10 Essential (primary) hypertension: Secondary | ICD-10-CM

## 2022-07-03 DIAGNOSIS — E785 Hyperlipidemia, unspecified: Secondary | ICD-10-CM | POA: Diagnosis not present

## 2022-07-03 DIAGNOSIS — E119 Type 2 diabetes mellitus without complications: Secondary | ICD-10-CM | POA: Diagnosis not present

## 2022-07-03 LAB — BAYER DCA HB A1C WAIVED: HB A1C (BAYER DCA - WAIVED): 5.8 % — ABNORMAL HIGH (ref 4.8–5.6)

## 2022-07-03 MED ORDER — ATORVASTATIN CALCIUM 80 MG PO TABS: 80.0000 mg | ORAL_TABLET | Freq: Every day | ORAL | 2 refills | Status: DC

## 2022-07-03 MED ORDER — IBANDRONATE SODIUM 150 MG PO TABS: 150.0000 mg | ORAL_TABLET | ORAL | 3 refills | Status: DC

## 2022-07-03 MED ORDER — CLONIDINE HCL 0.1 MG PO TABS: 0.1000 mg | ORAL_TABLET | Freq: Two times a day (BID) | ORAL | 3 refills | Status: DC

## 2022-07-03 NOTE — Progress Notes (Signed)
Subjective:  Patient ID: Rita Payne, female    DOB: [birth year]  Age: 77 y.o. MRN: [id number]  CC: Medical Management of Chronic Issues   HPI Rita Payne presents forFollow-up of diabetes. Patient checks blood sugar at home.   100-120 fasting and 110-140 postprandial. Log reviewed Patient denies symptoms such as polyuria, polydipsia, excessive hunger, nausea No significant hypoglycemic spells noted. Medications reviewed. Pt reports taking them regularly without complication/adverse reaction being reported today.   presents for  follow-up of hypertension. Patient has no history of headache chest pain or shortness of breath or recent cough. Patient also denies symptoms of TIA such as focal numbness or weakness. Patient denies side effects from medication. States taking it regularly.Back on catapress since SBP went over 200 and she was having daily HA. Still on micardis HCT.   Dced ciagarettes. Now off of them 3 months.   Had knee replacement 3 mos. Ago. Now back to work. No discomfort unless she walks a long way. Considering taking rollator on vacation next week to [address]    History Rita Payne has a past medical history of Allergy, Arthritis, Basal cell carcinoma, Cataract, Chronic kidney disease, Diarrhea, First degree heart block, GERD (gastroesophageal reflux disease), Hyperlipidemia, Hypertension, Leg fracture, Melanoma (HCC), Mild intermittent asthma, Neuromuscular disorder (HCC), Pneumonia, SUI (stress urinary incontinence, female), and Type 2 diabetes mellitus (HCC).  t  She has a past surgical history that includes Tonsillectomy; Abdominal hysterectomy (1987); Cholecystectomy (1989); Knee arthroscopy (Bilateral); Hernia repair; kidney tumor removal  (1986); Blepharoptosis repair (Bilateral, 2009); Total hip arthroplasty (Right, 08/25/2012); Lumbar laminectomy/decompression microdiscectomy (N/A, 12/08/2013); Pubovaginal sling (N/A, 10/03/2014); Replacement total hip w/  resurfacing  implants (Right); Colonoscopy; Polypectomy; and Total knee arthroplasty (Left, 04/08/2022).   Her family history includes Arrhythmia in her brother; Arthritis/Rheumatoid in her brother and sister; Colon polyps in her sister and sister; Diabetes in her mother, sister, and sister; Heart attack in her brother and mother; Heart disease in her brother, father, mother, and sister; Heart failure in her brother and sister; Stroke in her father, mother, and sister; Vision loss in her brother.She reports that she quit smoking about 3 months ago. Her smoking use included cigarettes. She has a 8.25 pack-year smoking history. She has never used smokeless tobacco. She reports that she does not drink alcohol and does not use drugs.  Current Outpatient Medications on File Prior to Visit  Medication Sig Dispense Refill   albuterol (VENTOLIN HFA) 108 (90 Base) MCG/ACT inhaler USE 2 INHALATIONS EVERY 6 HOURS AS NEEDED FOR WHEEZING OR SHORTNESS OF BREATH, NEED FOR CHRONIC ASTHMA. 20.1 g 0   cetirizine (ZYRTEC) 10 MG tablet Take 1 tablet (10 mg total) by mouth daily. 90 tablet 3   cholecalciferol (VITAMIN D) 1000 UNITS tablet Take 1,000 Units by mouth daily.     Dulaglutide (TRULICITY) 1.5 MG/0.5ML SOPN Inject content of one pen under the skin weekly 6 mL 0   Estradiol (YUVAFEM) 10 MCG TABS vaginal tablet INSERT 1 TABLET VAGINALLY TWICE A WEEK 24 tablet 1   Fluticasone-Salmeterol (ADVAIR DISKUS) 250-50 MCG/DOSE AEPB USE 1 INHALATION EVERY 12 HOURS 180 each 3   glucose blood (FREESTYLE LITE) test strip Use as instructed 200 strip 3   MAGNESIUM PO Take 420 mg by mouth daily.     Methen-Hyosc-Meth Blue-Na Phos (UROGESIC-BLUE) 81.6 MG TABS Take 1 tablet (81.6 mg total) by mouth 4 (four) times daily as needed. 20 tablet 1   omeprazole (PRILOSEC) 40 MG capsule TAKE 1 CAPSULE DAILY 90 capsule  0   Current Facility-Administered Medications on File Prior to Visit  Medication Dose Route Frequency Provider Last Rate Last Admin    magnesium citrate solution 1 Bottle  1 Bottle Oral Once Jethro Bolus, MD       sodium phosphate (FLEET) 7-19 GM/118ML enema 1 enema  1 enema Rectal Once Jethro Bolus, MD        ROS Review of Systems  Constitutional: Negative.   HENT: Negative.    Eyes:  Negative for visual disturbance.  Respiratory:  Negative for shortness of breath.   Cardiovascular:  Negative for chest pain.  Gastrointestinal:  Negative for abdominal pain.  Musculoskeletal:  Negative for arthralgias.    Objective:  BP (!) 98/56   Pulse 84   Temp 97.7 F (36.5 C)   Ht 5' 4.25" (1.632 m)   Wt 174 lb 3.2 oz (79 kg)   LMP 04/14/1982   SpO2 94%   BMI 29.67 kg/m   BP Readings from Last 3 Encounters:  07/03/22 (!) 98/56  05/15/22 111/60  04/30/22 104/63    Wt Readings from Last 3 Encounters:  07/03/22 174 lb 3.2 oz (79 kg)  05/15/22 169 lb 6.4 oz (76.8 kg)  04/30/22 170 lb 6.4 oz (77.3 kg)     Physical Exam Constitutional:      General: She is not in acute distress.    Appearance: She is well-developed.  Cardiovascular:     Rate and Rhythm: Normal rate and regular rhythm.  Pulmonary:     Breath sounds: Normal breath sounds.  Musculoskeletal:        General: Normal range of motion.  Skin:    General: Skin is warm and dry.  Neurological:     Mental Status: She is alert and oriented to person, place, and time.       Assessment & Plan:   Rita Payne was seen today for medical management of chronic issues.  Diagnoses and all orders for this visit:  Type 2 diabetes mellitus without complication, without long-term current use of insulin (HCC) -     Bayer DCA Hb A1c Waived  Hypertension associated with type 2 diabetes mellitus (HCC) -     CBC with Differential/Platelet -     CMP14+EGFR  Hyperlipidemia associated with type 2 diabetes mellitus (HCC) -     Lipid panel  Essential hypertension -     cloNIDine (CATAPRES) 0.1 MG tablet; Take 1 tablet (0.1 mg total) by mouth 2 (two)  times daily.  Other orders -     ibandronate (BONIVA) 150 MG tablet; Take 1 tablet (150 mg total) by mouth every 30 (thirty) days. Take in the morning with a full glass of water, on an empty stomach, and do not take anything else by mouth or lie down for the next 30 min. -     atorvastatin (LIPITOR) 80 MG tablet; Take 1 tablet (80 mg total) by mouth daily.      I have discontinued Rita Graffeo "Sandy"'s fenofibrate, alendronate, Micardis HCT, and cephALEXin. I am also having her start on ibandronate. Additionally, I am having her maintain her cholecalciferol, Fluticasone-Salmeterol, MAGNESIUM PO, cetirizine, Yuvafem, albuterol, FREESTYLE LITE, Trulicity, omeprazole, Urogesic-Blue, atorvastatin, and cloNIDine.  Meds ordered this encounter  Medications   ibandronate (BONIVA) 150 MG tablet    Sig: Take 1 tablet (150 mg total) by mouth every 30 (thirty) days. Take in the morning with a full glass of water, on an empty stomach, and do not take anything else by mouth or  lie down for the next 30 min.    Dispense:  3 tablet    Refill:  3   atorvastatin (LIPITOR) 80 MG tablet    Sig: Take 1 tablet (80 mg total) by mouth daily.    Dispense:  90 tablet    Refill:  2   cloNIDine (CATAPRES) 0.1 MG tablet    Sig: Take 1 tablet (0.1 mg total) by mouth 2 (two) times daily.    Dispense:  180 tablet    Refill:  3     Follow-up: Return in about 3 months (around 10/03/2022) for diabetes.  Mechele Claude, M.D.

## 2022-07-03 NOTE — Patient Instructions (Signed)
Rita Payne , Thank you for taking time to come for your Medicare Wellness Visit. I appreciate your ongoing commitment to your health goals. Please review the following plan we discussed and let me know if I can assist you in the future.   These are the goals we discussed:  Goals      Chronic Disease Management Needs     CARE PLAN ENTRY (see longtitudinal plan of care for additional care plan information)  Current Barriers:  Chronic Disease Management support, education, and care coordination needs related to HTN, DM, Asthma, GERD, HLD, OA, osteoporosis  Clinical Goal(s) related to HTN, DM, Asthma, GERD, HLD, OA, osteoporosis:  Over the next 60 days, patient will:  Work with the care management team to address educational, disease management, and care coordination needs  Begin or continue self health monitoring activities as directed today Measure and record cbg (blood glucose) daily times daily and Measure and record blood pressure 3 times per week Call provider office for new or worsened signs and symptoms Blood glucose findings outside established parameters and Blood pressure findings outside established parameters Call care management team with questions or concerns Verbalize basic understanding of patient centered plan of care established today  Interventions related to HTN, DM, Asthma, GERD, HLD, OA, osteoporosis:  Evaluation of current treatment plans and patient's adherence to plan as established by provider Assessed patient understanding of disease states Assessed patient's education and care coordination needs Provided disease specific education to patient  Collaborated with appropriate clinical care team members regarding patient needs Provided with CCM contact information and encouraged to reach out as needed Recommended she talk with LCSW regarding stress and psychosocial concerns  Patient Self Care Activities related to HTN, DM, Asthma, GERD, HLD, OA, osteoporosis:  Patient  is able to perform ADLs and IADLs independently  Initial goal documentation      Exercise 150 minutes per week (moderate activity)     Try to exercise for 30 minutes 5 times per week.     Patient Stated     06/22/2021 AWV Goal: Keep All Scheduled Appointments  Over the next year, patient will attend all scheduled appointments with their PCP and any specialists that they see.      Quit Smoking     Patient plans to talk with Bennie Pierini regarding smoking cessation aids, set a quit date and work toward quitting smoking.         This is a list of the screening recommended for you and due dates:  Health Maintenance  Topic Date Due   DTaP/Tdap/Td vaccine (2 - Td or Tdap) 07/14/2017   Complete foot exam   06/22/2022   COVID-19 Vaccine (5 - 2023-24 season) 07/19/2022*   Zoster (Shingles) Vaccine (1 of 2) 08/15/2022*   Flu Shot  08/08/2022   Mammogram  09/01/2022   Yearly kidney health urinalysis for diabetes  09/21/2022   Hemoglobin A1C  01/02/2023   Yearly kidney function blood test for diabetes  04/09/2023   Eye exam for diabetics  05/06/2023   Medicare Annual Wellness Visit  07/03/2023   DEXA scan (bone density measurement)  05/14/2024   Colon Cancer Screening  08/15/2024   Pneumonia Vaccine  Completed   Hepatitis C Screening  Completed   HPV Vaccine  Aged Out   Cologuard (Stool DNA test)  Discontinued  *Topic was postponed. The date shown is not the original due date.    Advanced directives: In Chart   Conditions/risks identified: Aim for 30  minutes of exercise or brisk walking, 6-8 glasses of water, and 5 servings of fruits and vegetables each day.   Next appointment: Follow up in one year for your annual wellness visit    Preventive Care 65 Years and Older, Female Preventive care refers to lifestyle choices and visits with your health care provider that can promote health and wellness. What does preventive care include? A yearly physical exam. This is also  called an annual well check. Dental exams once or twice a year. Routine eye exams. Ask your health care provider how often you should have your eyes checked. Personal lifestyle choices, including: Daily care of your teeth and gums. Regular physical activity. Eating a healthy diet. Avoiding tobacco and drug use. Limiting alcohol use. Practicing safe sex. Taking low-dose aspirin every day. Taking vitamin and mineral supplements as recommended by your health care provider. What happens during an annual well check? The services and screenings done by your health care provider during your annual well check will depend on your age, overall health, lifestyle risk factors, and family history of disease. Counseling  Your health care provider may ask you questions about your: Alcohol use. Tobacco use. Drug use. Emotional well-being. Home and relationship well-being. Sexual activity. Eating habits. History of falls. Memory and ability to understand (cognition). Work and work Astronomer. Reproductive health. Screening  You may have the following tests or measurements: Height, weight, and BMI. Blood pressure. Lipid and cholesterol levels. These may be checked every 5 years, or more frequently if you are over 84 years old. Skin check. Lung cancer screening. You may have this screening every year starting at age 38 if you have a 30-pack-year history of smoking and currently smoke or have quit within the past 15 years. Fecal occult blood test (FOBT) of the stool. You may have this test every year starting at age 103. Flexible sigmoidoscopy or colonoscopy. You may have a sigmoidoscopy every 5 years or a colonoscopy every 10 years starting at age 51. Hepatitis C blood test. Hepatitis B blood test. Sexually transmitted disease (STD) testing. Diabetes screening. This is done by checking your blood sugar (glucose) after you have not eaten for a while (fasting). You may have this done every 1-3  years. Bone density scan. This is done to screen for osteoporosis. You may have this done starting at age 58. Mammogram. This may be done every 1-2 years. Talk to your health care provider about how often you should have regular mammograms. Talk with your health care provider about your test results, treatment options, and if necessary, the need for more tests. Vaccines  Your health care provider may recommend certain vaccines, such as: Influenza vaccine. This is recommended every year. Tetanus, diphtheria, and acellular pertussis (Tdap, Td) vaccine. You may need a Td booster every 10 years. Zoster vaccine. You may need this after age 31. Pneumococcal 13-valent conjugate (PCV13) vaccine. One dose is recommended after age 33. Pneumococcal polysaccharide (PPSV23) vaccine. One dose is recommended after age 56. Talk to your health care provider about which screenings and vaccines you need and how often you need them. This information is not intended to replace advice given to you by your health care provider. Make sure you discuss any questions you have with your health care provider. Document Released: 01/20/2015 Document Revised: 09/13/2015 Document Reviewed: 10/25/2014 Elsevier Interactive Patient Education  2017 [employer].  Fall Prevention in the Home Falls can cause injuries. They can happen to people of all ages. There are many things  you can do to make your home safe and to help prevent falls. What can I do on the outside of my home? Regularly fix the edges of walkways and driveways and fix any cracks. Remove anything that might make you trip as you walk through a door, such as a raised step or threshold. Trim any bushes or trees on the path to your home. Use bright outdoor lighting. Clear any walking paths of anything that might make someone trip, such as rocks or tools. Regularly check to see if handrails are loose or broken. Make sure that both sides of any steps have  handrails. Any raised decks and porches should have guardrails on the edges. Have any leaves, snow, or ice cleared regularly. Use sand or salt on walking paths during winter. Clean up any spills in your garage right away. This includes oil or grease spills. What can I do in the bathroom? Use night lights. Install grab bars by the toilet and in the tub and shower. Do not use towel bars as grab bars. Use non-skid mats or decals in the tub or shower. If you need to sit down in the shower, use a plastic, non-slip stool. Keep the floor dry. Clean up any water that spills on the floor as soon as it happens. Remove soap buildup in the tub or shower regularly. Attach bath mats securely with double-sided non-slip rug tape. Do not have throw rugs and other things on the floor that can make you trip. What can I do in the bedroom? Use night lights. Make sure that you have a light by your bed that is easy to reach. Do not use any sheets or blankets that are too big for your bed. They should not hang down onto the floor. Have a firm chair that has side arms. You can use this for support while you get dressed. Do not have throw rugs and other things on the floor that can make you trip. What can I do in the kitchen? Clean up any spills right away. Avoid walking on wet floors. Keep items that you use a lot in easy-to-reach places. If you need to reach something above you, use a strong step stool that has a grab bar. Keep electrical cords out of the way. Do not use floor polish or wax that makes floors slippery. If you must use wax, use non-skid floor wax. Do not have throw rugs and other things on the floor that can make you trip. What can I do with my stairs? Do not leave any items on the stairs. Make sure that there are handrails on both sides of the stairs and use them. Fix handrails that are broken or loose. Make sure that handrails are as long as the stairways. Check any carpeting to make sure  that it is firmly attached to the stairs. Fix any carpet that is loose or worn. Avoid having throw rugs at the top or bottom of the stairs. If you do have throw rugs, attach them to the floor with carpet tape. Make sure that you have a light switch at the top of the stairs and the bottom of the stairs. If you do not have them, ask someone to add them for you. What else can I do to help prevent falls? Wear shoes that: Do not have high heels. Have rubber bottoms. Are comfortable and fit you well. Are closed at the toe. Do not wear sandals. If you use a stepladder: Make sure that it is  fully opened. Do not climb a closed stepladder. Make sure that both sides of the stepladder are locked into place. Ask someone to hold it for you, if possible. Clearly mark and make sure that you can see: Any grab bars or handrails. First and last steps. Where the edge of each step is. Use tools that help you move around (mobility aids) if they are needed. These include: Canes. Walkers. Scooters. Crutches. Turn on the lights when you go into a dark area. Replace any light bulbs as soon as they burn out. Set up your furniture so you have a clear path. Avoid moving your furniture around. If any of your floors are uneven, fix them. If there are any pets around you, be aware of where they are. Review your medicines with your doctor. Some medicines can make you feel dizzy. This can increase your chance of falling. Ask your doctor what other things that you can do to help prevent falls. This information is not intended to replace advice given to you by your health care provider. Make sure you discuss any questions you have with your health care provider. Document Released: 10/20/2008 Document Revised: 06/01/2015 Document Reviewed: 01/28/2014 Elsevier Interactive Patient Education  2017 [employer].

## 2022-07-03 NOTE — Progress Notes (Signed)
Subjective:   Rita Payne is a 77 y.o. female who presents for Medicare Annual (Subsequent) preventive examination.  Visit Complete: Virtual  I connected with  Trang Bouse on 07/03/22 by a audio enabled telemedicine application and verified that I am speaking with the correct person using two identifiers.  Patient Location: Home  Provider Location: Home Office  I discussed the limitations of evaluation and management by telemedicine. The patient expressed understanding and agreed to proceed.  Patient Medicare AWV questionnaire was completed by the patient on 07/03/2022; I have confirmed that all information answered by patient is correct and no changes since this date.  Review of Systems    Nutrition Risk Assessment:  Has the patient had any N/V/D within the last 2 months?  No  Does the patient have any non-healing wounds?  No  Has the patient had any unintentional weight loss or weight gain?  No   Diabetes:  Is the patient diabetic?  Yes  If diabetic, was a CBG obtained today?  No  Did the patient bring in their glucometer from home?  No  How often do you monitor your CBG's? 2 x day .   Financial Strains and Diabetes Management:  Are you having any financial strains with the device, your supplies or your medication? No .  Does the patient want to be seen by Chronic Care Management for management of their diabetes?  No  Would the patient like to be referred to a Nutritionist or for Diabetic Management?  No   Diabetic Exams:  Diabetic Eye Exam: Completed 02/2022 Diabetic Foot Exam: Overdue, Pt has been advised about the importance in completing this exam. Pt is scheduled for diabetic foot exam on next office visit .  Cardiac Risk Factors include: advanced age (>79men, >75 women);diabetes mellitus;dyslipidemia;hypertension     Objective:    Today's Vitals   07/03/22 1533  Weight: 175 lb (79.4 kg)  Height: 5\' 4"  (1.626 m)   Body mass index is 30.04 kg/m.      07/03/2022    3:37 PM 04/08/2022   12:11 PM 03/29/2022   10:32 AM 06/22/2021    2:50 PM 06/21/2020    2:35 PM 06/21/2019    2:54 PM 06/18/2018    3:32 PM  Advanced Directives  Does Patient Have a Medical Advance Directive? Yes Yes Yes Yes No Yes Yes  Type of Estate agent of [place];Living will Healthcare Power of [employer] of [place];Living will  Healthcare Power of [place];Living will Healthcare Power of [place];Living will  Does patient want to make changes to medical advance directive? No - Patient declined No - Patient declined No - Patient declined   No - Patient declined No - Patient declined  Copy of Healthcare Power of Attorney in Chart? Yes - validated most recent copy scanned in chart (See row information) Yes - validated most recent copy scanned in chart (See row information)  No - copy requested  No - copy requested No - copy requested  Would patient like information on creating a medical advance directive?     No - Patient declined      Current Medications (verified) Outpatient Encounter Medications as of 07/03/2022  Medication Sig   albuterol (VENTOLIN HFA) 108 (90 Base) MCG/ACT inhaler USE 2 INHALATIONS EVERY 6 HOURS AS NEEDED FOR WHEEZING OR SHORTNESS OF BREATH, NEED FOR CHRONIC ASTHMA.   atorvastatin (LIPITOR) 80 MG tablet Take 1 tablet (80 mg total) by mouth daily.   cetirizine (ZYRTEC)  10 MG tablet Take 1 tablet (10 mg total) by mouth daily.   cholecalciferol (VITAMIN D) 1000 UNITS tablet Take 1,000 Units by mouth daily.   cloNIDine (CATAPRES) 0.1 MG tablet Take 1 tablet (0.1 mg total) by mouth 2 (two) times daily.   Dulaglutide (TRULICITY) 1.5 MG/0.5ML SOPN Inject content of one pen under the skin weekly   Estradiol (YUVAFEM) 10 MCG TABS vaginal tablet INSERT 1 TABLET VAGINALLY TWICE A WEEK   Fluticasone-Salmeterol (ADVAIR DISKUS) 250-50 MCG/DOSE AEPB USE 1 INHALATION EVERY 12 HOURS   glucose blood (FREESTYLE LITE) test strip Use as  instructed   ibandronate (BONIVA) 150 MG tablet Take 1 tablet (150 mg total) by mouth every 30 (thirty) days. Take in the morning with a full glass of water, on an empty stomach, and do not take anything else by mouth or lie down for the next 30 min.   MAGNESIUM PO Take 420 mg by mouth daily.   Methen-Hyosc-Meth Blue-Na Phos (UROGESIC-BLUE) 81.6 MG TABS Take 1 tablet (81.6 mg total) by mouth 4 (four) times daily as needed.   omeprazole (PRILOSEC) 40 MG capsule TAKE 1 CAPSULE DAILY   Facility-Administered Encounter Medications as of 07/03/2022  Medication   magnesium citrate solution 1 Bottle   sodium phosphate (FLEET) 7-19 GM/118ML enema 1 enema    Allergies (verified) Ivp dye [iodinated contrast media], Shellfish allergy, Vasotec [enalapril], Adhesive [tape], Amlodipine, Gabapentin, Iodine, and Pyridium [phenazopyridine hcl]   History: Past Medical History:  Diagnosis Date   Allergy    Arthritis    Basal cell carcinoma    Cataract    Chronic kidney disease    left kidney tumor removed, benign   Diarrhea    First degree heart block    GERD (gastroesophageal reflux disease)    Hyperlipidemia    Hypertension    Leg fracture    Melanoma (HCC)    Mild intermittent asthma    Neuromuscular disorder (HCC)    neuropathy feet - mild   Pneumonia    SUI (stress urinary incontinence, female)    Type 2 diabetes mellitus (HCC)    Past Surgical History:  Procedure Laterality Date   ABDOMINAL HYSTERECTOMY  1987   BELPHAROPTOSIS REPAIR Bilateral 2009   9    CHOLECYSTECTOMY  1989   COLONOSCOPY     HERNIA REPAIR     incisional hernia from cholecystectomy   kidney tumor removal   1986   KNEE ARTHROSCOPY Bilateral    LUMBAR LAMINECTOMY/DECOMPRESSION MICRODISCECTOMY N/A 12/08/2013   Procedure: LUMBAR DECOMPRESSION L4-L5,  L3-L4 ;  Surgeon: Javier Docker, MD;  Location: [facility];  Service: Orthopedics;  Laterality: N/A;   POLYPECTOMY     PUBOVAGINAL SLING N/A 10/03/2014   Procedure:  Leonides Grills;  Surgeon: Jethro Bolus, MD;  Location: [facility];  Service: Urology;  Laterality: N/A;   REPLACEMENT TOTAL HIP W/  RESURFACING IMPLANTS Right    TONSILLECTOMY     TOTAL HIP ARTHROPLASTY Right 08/25/2012   Procedure: RIGHT TOTAL HIP ARTHROPLASTY ANTERIOR APPROACH;  Surgeon: Shelda Pal, MD;  Location: [facility];  Service: Orthopedics;  Laterality: Right;   TOTAL KNEE ARTHROPLASTY Left 04/08/2022   Procedure: TOTAL KNEE ARTHROPLASTY;  Surgeon: Ollen Gross, MD;  Location: [facility];  Service: Orthopedics;  Laterality: Left;   Family History  Problem Relation Age of Onset   Diabetes Mother    Heart disease Mother    Stroke Mother    Heart attack Mother    Heart disease Father  Stroke Father    Diabetes Sister    Heart disease Sister    Heart failure Sister    Diabetes Sister    Stroke Sister    Colon polyps Sister    Arthritis/Rheumatoid Sister    Colon polyps Sister    Vision loss Brother    Arthritis/Rheumatoid Brother    Heart disease Brother    Heart failure Brother    Heart attack Brother    Arrhythmia Brother    Colon cancer Neg Hx    Esophageal cancer Neg Hx    Stomach cancer Neg Hx    Rectal cancer Neg Hx    Breast cancer Neg Hx    Social History   Socioeconomic History   Marital status: Widowed    Spouse name: Not on file   Number of children: 2   Years of education: some college   Highest education level: Some college, no degree  Occupational History   Occupation: IT consultant     Comment: 3 days per wk/ [employer]; 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 Son in , daughter in [place]. 2 sisters live nearby   Social Determinants of Health   Financial Resource Strain: Low Risk  (07/03/2022)   Overall Financial Resource Strain (CARDIA)    Difficulty of Paying Living Expenses: Not hard at all  Recent Concern: Financial Resource Strain - Medium Risk (04/29/2022)   Overall Financial Resource Strain (CARDIA)    Difficulty of Paying Living Expenses: Somewhat hard  Food Insecurity: No Food Insecurity (07/03/2022)   Hunger Vital Sign    Worried About Running Out of Food in the Last Year: Never true    Ran Out of Food in the Last Year: Never true  Transportation Needs: No Transportation Needs (07/03/2022)   PRAPARE - Administrator, Civil Service (Medical): No    Lack of Transportation (Non-Medical): No  Physical Activity: Insufficiently Active (07/03/2022)   Exercise Vital Sign    Days of Exercise per Week: 3 days    Minutes of Exercise per Session: 30 min  Stress: No Stress Concern Present (07/03/2022)   [employer] of Occupational Health - Occupational Stress Questionnaire    Feeling of Stress : Not at all  Social Connections: Socially Isolated (07/03/2022)   Social Connection and Isolation Panel [NHANES]    Frequency of Communication with Friends and Family: More than three times a week    Frequency of Social Gatherings with Friends and Family: More than three times a week    Attends Religious Services: Never    Database administrator or Organizations: No    Attends Banker Meetings: Never    Marital Status: Widowed    Tobacco Counseling Counseling given: Not Answered   Clinical Intake:  Pre-visit preparation completed: Yes  Pain : No/denies pain  Nutritional Risks: None Diabetes: Yes CBG done?: No Did pt. bring in CBG monitor from  home?: No  How often do you need to have someone help you when you read instructions, pamphlets, or other written materials from your doctor or pharmacy?: 1 - Never  Interpreter Needed?: No  Information entered by :: Renie Ora, LPN   Activities of Daily Living    07/03/2022    3:37 PM 07/02/2022    8:47 PM  In your present state of health, do you have any difficulty performing the following activities:  Hearing? 0 0  Vision? 0 0  Difficulty concentrating or making decisions? 0 0  Walking or climbing stairs? 0 0  Dressing or bathing? 0 0  Doing errands, shopping? 0 0  Preparing Food and eating ? N N  Using the Toilet? N N  In the past six months, have you accidently leaked urine? N N  Do you have problems with loss of bowel control? N N  Managing your Medications? N N  Managing your Finances? N N  Housekeeping or managing your Housekeeping? N N    Patient Care Team: Mechele Claude, MD as PCP - General (Family Medicine) Marcelino Duster, MD as Referring Physician (Dermatology) Durene Romans, MD as Consulting Physician (Orthopedic Surgery)  Indicate any recent Medical Services you may have received from other than [facility] providers in the past year (date may be approximate).     Assessment:   This is a routine wellness examination for [place].  Hearing/Vision screen Vision Screening - Comments:: Wears rx glasses - up to date with routine eye exams with  Dr.Cotter   Dietary issues and exercise activities discussed:     Goals Addressed             This Visit's Progress    Exercise 150 minutes per week (moderate activity)   On track    Try to exercise for 30 minutes 5 times per week.       Depression Screen    07/03/2022    3:36 PM 07/03/2022    8:27 AM 05/15/2022    2:15 PM 04/30/2022    9:03 AM 04/02/2022    8:09 AM 12/26/2021    8:17 AM 09/20/2021    8:03 AM  PHQ 2/9 Scores  PHQ - 2 Score 0 0 0 0 0 0 0    Fall Risk    07/03/2022    3:34 PM 07/03/2022     8:27 AM 07/02/2022    8:47 PM 05/15/2022    2:14 PM 04/30/2022    9:03 AM  Fall Risk   Falls in the past year? 1 0 1 0 0  Number falls in past yr: 1  0    Injury with Fall? 1  0    Risk for fall due to : History of fall(s);Impaired balance/gait;Orthopedic patient      Follow up Education provided;Falls prevention discussed;Falls evaluation completed        MEDICARE RISK AT HOME:  Medicare Risk at Home - 07/03/22 1534     Any stairs in or around the home? No    If so, are there any without handrails? No    Home free of loose throw rugs in walkways, pet beds, electrical cords, etc? Yes    Adequate lighting in your home to reduce risk of falls? Yes    Life alert? No    Use of a cane, walker or w/c? No    Grab bars in the bathroom?  Yes    Shower chair or bench in shower? Yes    Elevated toilet seat or a handicapped toilet? Yes             TIMED UP AND GO:  Was the test performed?  No    Cognitive Function:    06/11/2017   10:41 AM 06/05/2016    9:19 AM  MMSE - Mini Mental State Exam  Orientation to time 5 5  Orientation to Place 5 5  Registration 3 3  Attention/ Calculation 5 5  Recall 3 3  Language- name 2 objects 2 2  Language- repeat 1 1  Language- follow 3 step command 3 3  Language- read & follow direction 1 1  Write a sentence 1 1  Copy design 1 1  Total score 30 30        07/03/2022    3:38 PM 06/22/2021    2:51 PM 06/21/2020    2:38 PM 06/21/2019    2:57 PM 06/18/2018    3:29 PM  6CIT Screen  What Year? 0 points 0 points 0 points 0 points 0 points  What month? 0 points 0 points 0 points 0 points 0 points  What time? 0 points 0 points 0 points 0 points 0 points  Count back from 20 0 points 0 points 0 points 0 points 0 points  Months in reverse 0 points 0 points 0 points 0 points 0 points  Repeat phrase 0 points 0 points 0 points 2 points 2 points  Total Score 0 points 0 points 0 points 2 points 2 points    Immunizations Immunization History   Administered Date(s) Administered   Fluad Quad(high Dose 65+) 11/30/2018, 12/07/2019, 12/19/2020, 12/06/2021   Influenza, High Dose Seasonal PF 11/08/2016, 10/10/2017   Influenza,inj,Quad PF,6+ Mos 10/21/2012, 11/11/2013, 12/23/2014, 10/05/2015   Influenza,inj,quad, With Preservative 10/07/2016   Moderna SARS-COV2 Booster Vaccination 04/25/2020   Moderna Sars-Covid-2 Vaccination 02/18/2019, 03/19/2019, 11/16/2019   Pneumococcal Conjugate-13 06/01/2014   Pneumococcal Polysaccharide-23 07/14/2012   Pneumococcal-Unspecified 01/08/2015   RSV,unspecified 01/11/2022   Tdap 07/15/2007    TDAP status: Due, Education has been provided regarding the importance of this vaccine. Advised may receive this vaccine at local pharmacy or Health Dept. Aware to provide a copy of the vaccination record if obtained from local pharmacy or Health Dept. Verbalized acceptance and understanding.  Flu Vaccine status: Up to date  Pneumococcal vaccine status: Up to date  Covid-19 vaccine status: Completed vaccines  Qualifies for Shingles Vaccine? Yes   Zostavax completed No   Shingrix Completed?: No.    Education has been provided regarding the importance of this vaccine. Patient has been advised to call insurance company to determine out of pocket expense if they have not yet received this vaccine. Advised may also receive vaccine at local pharmacy or Health Dept. Verbalized acceptance and understanding.  Screening Tests Health Maintenance  Topic Date Due   DTaP/Tdap/Td (2 - Td or Tdap) 07/14/2017   FOOT EXAM  06/22/2022   COVID-19 Vaccine (5 - 2023-24 season) 07/19/2022 (Originally 09/07/2021)   Zoster Vaccines- Shingrix (1 of 2) 08/15/2022 (Originally 04/29/1997)   INFLUENZA VACCINE  08/08/2022   MAMMOGRAM  09/01/2022   Diabetic kidney evaluation - Urine ACR  09/21/2022   HEMOGLOBIN A1C  01/02/2023   Diabetic kidney evaluation - eGFR measurement  04/09/2023   OPHTHALMOLOGY EXAM  05/06/2023   Medicare  Annual Wellness (AWV)  07/03/2023   DEXA SCAN  05/14/2024   Colonoscopy  08/15/2024  Pneumonia Vaccine 55+ Years old  Completed   Hepatitis C Screening  Completed   HPV VACCINES  Aged Out   Fecal DNA (Cologuard)  Discontinued    Health Maintenance  Health Maintenance Due  Topic Date Due   DTaP/Tdap/Td (2 - Td or Tdap) 07/14/2017   FOOT EXAM  06/22/2022    Colorectal cancer screening: No longer required.   Mammogram status: No longer required due to age .  Bone Density status: Completed 05/15/2022. Results reflect: Bone density results: OSTEOPOROSIS. Repeat every 2 years.  Lung Cancer Screening: (Low Dose CT Chest recommended if Age 10-80 years, 20 pack-year currently smoking OR have quit w/in 15years.) does not qualify.   Lung Cancer Screening Referral: n/a  Additional Screening:  Hepatitis C Screening: does not qualify; Completed 03/23/2015  Vision Screening: Recommended annual ophthalmology exams for early detection of glaucoma and other disorders of the eye. Is the patient up to date with their annual eye exam?  Yes  Who is the provider or what is the name of the office in which the patient attends annual eye exams? Dr.Cotter  If pt is not established with a provider, would they like to be referred to a provider to establish care? No .   Dental Screening: Recommended annual dental exams for proper oral hygiene  Diabetic Foot Exam: Diabetic Foot Exam: Overdue, Pt has been advised about the importance in completing this exam. Pt is scheduled for diabetic foot exam on next office visit .  Community Resource Referral / Chronic Care Management: CRR required this visit?  No   CCM required this visit?  No     Plan:     I have personally reviewed and noted the following in the patient's chart:   Medical and social history Use of alcohol, tobacco or illicit drugs  Current medications and supplements including opioid prescriptions. Patient is not currently taking  opioid prescriptions. Functional ability and status Nutritional status Physical activity Advanced directives List of other physicians Hospitalizations, surgeries, and ER visits in previous 12 months Vitals Screenings to include cognitive, depression, and falls Referrals and appointments  In addition, I have reviewed and discussed with patient certain preventive protocols, quality metrics, and best practice recommendations. A written personalized care plan for preventive services as well as general preventive health recommendations were provided to patient.     Lorrene Reid, LPN   02/20/863   After Visit Summary: (MyChart) Due to this being a telephonic visit, the after visit summary with patients personalized plan was offered to patient via MyChart   Nurse Notes: Due Tdap Vaccine

## 2022-07-04 LAB — CMP14+EGFR
ALT: 18 IU/L (ref 0–32)
AST: 19 IU/L (ref 0–40)
Albumin: 4.4 g/dL (ref 3.8–4.8)
Alkaline Phosphatase: 67 IU/L (ref 44–121)
BUN/Creatinine Ratio: 26 (ref 12–28)
BUN: 21 mg/dL (ref 8–27)
Bilirubin Total: 0.3 mg/dL (ref 0.0–1.2)
CO2: 24 mmol/L (ref 20–29)
Calcium: 9.9 mg/dL (ref 8.7–10.3)
Chloride: 99 mmol/L (ref 96–106)
Creatinine, Ser: 0.8 mg/dL (ref 0.57–1.00)
Globulin, Total: 2.4 g/dL (ref 1.5–4.5)
Glucose: 112 mg/dL — ABNORMAL HIGH (ref 70–99)
Potassium: 4.8 mmol/L (ref 3.5–5.2)
Sodium: 139 mmol/L (ref 134–144)
Total Protein: 6.8 g/dL (ref 6.0–8.5)
eGFR: 77 mL/min/1.73

## 2022-07-04 LAB — CBC WITH DIFFERENTIAL/PLATELET
Basophils Absolute: 0.1 10*3/uL (ref 0.0–0.2)
Basos: 1 %
EOS (ABSOLUTE): 0.3 10*3/uL (ref 0.0–0.4)
Eos: 4 %
Hematocrit: 41.1 % (ref 34.0–46.6)
Hemoglobin: 14 g/dL (ref 11.1–15.9)
Immature Grans (Abs): 0 10*3/uL (ref 0.0–0.1)
Immature Granulocytes: 0 %
Lymphocytes Absolute: 2.1 10*3/uL (ref 0.7–3.1)
Lymphs: 26 %
MCH: 32.1 pg (ref 26.6–33.0)
MCHC: 34.1 g/dL (ref 31.5–35.7)
MCV: 94 fL (ref 79–97)
Monocytes Absolute: 0.8 10*3/uL (ref 0.1–0.9)
Monocytes: 10 %
Neutrophils Absolute: 4.7 10*3/uL (ref 1.4–7.0)
Neutrophils: 59 %
Platelets: 146 10*3/uL — ABNORMAL LOW (ref 150–450)
RBC: 4.36 x10E6/uL (ref 3.77–5.28)
RDW: 13.6 % (ref 11.7–15.4)
WBC: 7.9 10*3/uL (ref 3.4–10.8)

## 2022-07-04 LAB — LIPID PANEL
Chol/HDL Ratio: 6.4 ratio — ABNORMAL HIGH (ref 0.0–4.4)
Cholesterol, Total: 216 mg/dL — ABNORMAL HIGH (ref 100–199)
HDL: 34 mg/dL — ABNORMAL LOW (ref >39)
LDL Chol Calc (NIH): 99 mg/dL (ref 0–99)
Triglycerides: 494 mg/dL — ABNORMAL HIGH (ref 0–149)
VLDL Cholesterol Cal: 83 mg/dL — ABNORMAL HIGH (ref 5–40)

## 2022-07-05 LAB — URINE CULTURE

## 2022-07-24 ENCOUNTER — Other Ambulatory Visit: Payer: Self-pay | Admitting: Family Medicine

## 2022-07-24 ENCOUNTER — Ambulatory Visit (INDEPENDENT_AMBULATORY_CARE_PROVIDER_SITE_OTHER): Payer: Medicare Other | Admitting: Family Medicine

## 2022-07-24 ENCOUNTER — Encounter: Payer: Self-pay | Admitting: Family Medicine

## 2022-07-24 VITALS — BP 138/72 | HR 84 | Temp 97.6°F | Ht 64.0 in | Wt 179.6 lb

## 2022-07-24 DIAGNOSIS — I10 Essential (primary) hypertension: Secondary | ICD-10-CM

## 2022-07-24 DIAGNOSIS — R600 Localized edema: Secondary | ICD-10-CM

## 2022-07-24 MED ORDER — TRIAMTERENE-HCTZ 37.5-25 MG PO TABS: 1.0000 | ORAL_TABLET | Freq: Every day | ORAL | 0 refills | Status: DC

## 2022-07-24 NOTE — Progress Notes (Signed)
Subjective:  Patient ID: Rita Payne, female    DOB: May 03, 1945  Age: 77 y.o. MRN: [id number]  CC: Foot Swelling (bilateral)   HPI Rita [place] and feet started swelling so bad she had to cut off her shoes one night. Due to low pressure at last visit, her micardis HCT was Dced. Edema started soon after.     07/24/2022    3:18 PM 07/03/2022    3:36 PM 07/03/2022    8:27 AM  Depression screen PHQ 2/9  Decreased Interest 0 0 0  Down, Depressed, Hopeless 0 0 0  PHQ - 2 Score 0 0 0    History Rita Payne has a past medical history of Allergy, Arthritis, Basal cell carcinoma, Cataract, Chronic kidney disease, Diarrhea, First degree heart block, GERD (gastroesophageal reflux disease), Hyperlipidemia, Hypertension, Leg fracture, Melanoma (HCC), Mild intermittent asthma, Neuromuscular disorder (HCC), Pneumonia, SUI (stress urinary incontinence, female), and Type 2 diabetes mellitus (HCC).   She has a past surgical history that includes Tonsillectomy; Abdominal hysterectomy (1987); Cholecystectomy (1989); Knee arthroscopy (Bilateral); Hernia repair; kidney tumor removal  (1986); Blepharoptosis repair (Bilateral, 2009); Total hip arthroplasty (Right, 08/25/2012); Lumbar laminectomy/decompression microdiscectomy (N/A, 12/08/2013); Pubovaginal sling (N/A, 10/03/2014); Replacement total hip w/  resurfacing implants (Right); Colonoscopy; Polypectomy; and Total knee arthroplasty (Left, 04/08/2022).   Her family history includes Arrhythmia in her brother; Arthritis/Rheumatoid in her brother and sister; Colon polyps in her sister and sister; Diabetes in her mother, sister, and sister; Heart attack in her brother and mother; Heart disease in her brother, father, mother, and sister; Heart failure in her brother and sister; Stroke in her father, mother, and sister; Vision loss in her brother.She reports that she quit smoking about 4 months ago. Her smoking use included cigarettes. She  started smoking about 33 years ago. She has a 8.3 pack-year smoking history. She has never used smokeless tobacco. She reports that she does not drink alcohol and does not use drugs.    ROS Review of Systems  Constitutional: Negative.   HENT: Negative.    Eyes:  Negative for visual disturbance.  Respiratory:  Negative for shortness of breath.   Cardiovascular:  Negative for chest pain.  Gastrointestinal:  Negative for abdominal pain.  Musculoskeletal:  Negative for arthralgias.    Objective:  BP 138/72   Pulse 84   Temp 97.6 F (36.4 C)   Ht 5\' 4"  (1.626 m)   Wt 179 lb 9.6 oz (81.5 kg)   LMP 04/14/1982   SpO2 97%   BMI 30.83 kg/m   BP Readings from Last 3 Encounters:  07/24/22 138/72  07/03/22 (!) 98/56  05/15/22 111/60    Wt Readings from Last 3 Encounters:  07/24/22 179 lb 9.6 oz (81.5 kg)  07/03/22 175 lb (79.4 kg)  07/03/22 174 lb 3.2 oz (79 kg)     Physical Exam Constitutional:      General: She is not in acute distress.    Appearance: She is well-developed.  HENT:     Head: Normocephalic and atraumatic.  Eyes:     Conjunctiva/sclera: Conjunctivae normal.     Pupils: Pupils are equal, round, and reactive to light.  Neck:     Thyroid: No thyromegaly.  Cardiovascular:     Rate and Rhythm: Normal rate and regular rhythm.     Heart sounds: Normal heart sounds. No murmur heard. Pulmonary:     Effort: Pulmonary effort is normal. No respiratory distress.     Breath  sounds: Normal breath sounds. No wheezing or rales.  Abdominal:     General: Bowel sounds are normal. There is no distension.     Palpations: Abdomen is soft.     Tenderness: There is no abdominal tenderness.  Musculoskeletal:        General: Normal range of motion.     Cervical back: Normal range of motion and neck supple.     Right lower leg: Edema present.     Left lower leg: Edema present.  Lymphadenopathy:     Cervical: No cervical adenopathy.  Skin:    General: Skin is warm and dry.   Neurological:     Mental Status: She is alert and oriented to person, place, and time.  Psychiatric:        Behavior: Behavior normal.        Thought Content: Thought content normal.        Judgment: Judgment normal.       Assessment & Plan:   Rita Payne" was seen today for foot swelling.  Diagnoses and all orders for this visit:  Primary hypertension  Edema of both lower legs  Other orders -     Discontinue: triamterene-hydrochlorothiazide (MAXZIDE-25) 37.5-25 MG tablet; Take 1 tablet by mouth daily.       I have discontinued Rita Payne "Rita Payne"'s Urogesic-Blue. I am also having her maintain her cholecalciferol, Fluticasone-Salmeterol, MAGNESIUM PO, cetirizine, Yuvafem, albuterol, FREESTYLE LITE, Trulicity, omeprazole, ibandronate, atorvastatin, and cloNIDine.  Allergies as of 07/24/2022       Reactions   Ivp Dye [iodinated Contrast Media] Anaphylaxis   Shellfish Allergy Anaphylaxis   Vasotec [enalapril] Shortness Of Breath   Adhesive [tape] Other (See Comments)   blisters   Amlodipine Swelling   Gabapentin Other (See Comments)   drowsiness   Iodine Other (See Comments)   Iodine that is applied to skin causes blisters    Pyridium [phenazopyridine Hcl]    blisters        Medication List        Accurate as of July 24, 2022 10:01 PM. If you have any questions, ask your nurse or doctor.          STOP taking these medications    Urogesic-Blue 81.6 MG Tabs Stopped by: Rita Payne       TAKE these medications    albuterol 108 (90 Base) MCG/ACT inhaler Commonly known as: VENTOLIN HFA USE 2 INHALATIONS EVERY 6 HOURS AS NEEDED FOR WHEEZING OR SHORTNESS OF BREATH, NEED FOR CHRONIC ASTHMA.   atorvastatin 80 MG tablet Commonly known as: LIPITOR Take 1 tablet (80 mg total) by mouth daily.   cetirizine 10 MG tablet Commonly known as: ZYRTEC Take 1 tablet (10 mg total) by mouth daily.   cholecalciferol 1000 units tablet Commonly known as:  VITAMIN D Take 1,000 Units by mouth daily.   cloNIDine 0.1 MG tablet Commonly known as: CATAPRES Take 1 tablet (0.1 mg total) by mouth 2 (two) times daily.   Fluticasone-Salmeterol 250-50 MCG/DOSE Aepb Commonly known as: Advair Diskus USE 1 INHALATION EVERY 12 HOURS   FREESTYLE LITE test strip Generic drug: glucose blood Use as instructed   ibandronate 150 MG tablet Commonly known as: BONIVA Take 1 tablet (150 mg total) by mouth every 30 (thirty) days. Take in the morning with a full glass of water, on an empty stomach, and do not take anything else by mouth or lie down for the next 30 min.   MAGNESIUM PO Take 420 mg by mouth  daily.   omeprazole 40 MG capsule Commonly known as: PRILOSEC TAKE 1 CAPSULE DAILY   triamterene-hydrochlorothiazide 37.5-25 MG tablet Commonly known as: MAXZIDE-25 TAKE 1 TABLET BY MOUTH DAILY Started by: Nikaya Nasby   Trulicity 1.5 MG/0.5ML Sopn Generic drug: Dulaglutide Inject content of one pen under the skin weekly   Yuvafem 10 MCG Tabs vaginal tablet Generic drug: Estradiol INSERT 1 TABLET VAGINALLY TWICE A WEEK         Follow-up: Return in about 2 months (around 09/24/2022).  Mechele Claude, M.D.

## 2022-07-29 ENCOUNTER — Other Ambulatory Visit: Payer: Self-pay | Admitting: Family Medicine

## 2022-07-29 DIAGNOSIS — Z1231 Encounter for screening mammogram for malignant neoplasm of breast: Secondary | ICD-10-CM

## 2022-08-05 ENCOUNTER — Other Ambulatory Visit: Payer: Self-pay | Admitting: *Deleted

## 2022-08-05 ENCOUNTER — Telehealth: Payer: Self-pay | Admitting: Family Medicine

## 2022-08-05 ENCOUNTER — Other Ambulatory Visit: Payer: Self-pay | Admitting: Family Medicine

## 2022-08-05 MED ORDER — TRIAMTERENE-HCTZ 37.5-25 MG PO TABS: 1.0000 | ORAL_TABLET | Freq: Every day | ORAL | 0 refills | Status: DC

## 2022-08-05 NOTE — Telephone Encounter (Signed)
[facility] to [facility].

## 2022-08-05 NOTE — Telephone Encounter (Signed)
Pt called stating that Dr Darlyn Read recently prescribed her some medicine but it was sent to wrong pharmacy. Was sent to [facility] and needs to be sent to [facility].

## 2022-08-13 ENCOUNTER — Telehealth: Payer: Self-pay | Admitting: Family Medicine

## 2022-08-13 NOTE — Telephone Encounter (Signed)
Patient aware.

## 2022-08-13 NOTE — Telephone Encounter (Signed)
Patient reports that the Triamterene-hydrochlorothiazide has worked well for the swelling in her feet, it has all gone away, but she has noticed her blood sugars are elevated in the mornings since starting the medication.  She said she has made no dietary changes.  Normally she runs between 100-110 in the mornings but now is getting readings from 145-156.  It does level out by lunch time but she is wondering if the medication needs to be changed.  If so, her pharmacy is [facility].

## 2022-08-13 NOTE — Telephone Encounter (Signed)
Pt called requesting to speak to Dr Darlyn Read nurse regarding one of her medications. Says it is making her sugar very high and needs advise on what to do. Unsure of name of medicine but knows it has potassium in it and starts with a T.

## 2022-08-13 NOTE — Telephone Encounter (Signed)
DC the med and see what her sugars do over the next 2 weeks

## 2022-08-28 ENCOUNTER — Other Ambulatory Visit: Payer: Self-pay | Admitting: Family Medicine

## 2022-08-28 DIAGNOSIS — L570 Actinic keratosis: Secondary | ICD-10-CM | POA: Diagnosis not present

## 2022-08-28 DIAGNOSIS — Z85828 Personal history of other malignant neoplasm of skin: Secondary | ICD-10-CM | POA: Diagnosis not present

## 2022-08-28 DIAGNOSIS — Z1283 Encounter for screening for malignant neoplasm of skin: Secondary | ICD-10-CM | POA: Diagnosis not present

## 2022-08-28 DIAGNOSIS — J4520 Mild intermittent asthma, uncomplicated: Secondary | ICD-10-CM

## 2022-08-28 DIAGNOSIS — D485 Neoplasm of uncertain behavior of skin: Secondary | ICD-10-CM | POA: Diagnosis not present

## 2022-08-28 DIAGNOSIS — K219 Gastro-esophageal reflux disease without esophagitis: Secondary | ICD-10-CM

## 2022-09-03 ENCOUNTER — Ambulatory Visit: Payer: TRICARE For Life (TFL)

## 2022-09-04 ENCOUNTER — Inpatient Hospital Stay: Admission: RE | Admit: 2022-09-04 | Payer: TRICARE For Life (TFL) | Source: Ambulatory Visit

## 2022-09-04 ENCOUNTER — Telehealth: Payer: Self-pay | Admitting: Family Medicine

## 2022-09-09 DIAGNOSIS — U071 COVID-19: Secondary | ICD-10-CM | POA: Diagnosis not present

## 2022-09-09 DIAGNOSIS — J36 Peritonsillar abscess: Secondary | ICD-10-CM | POA: Diagnosis not present

## 2022-09-09 DIAGNOSIS — J020 Streptococcal pharyngitis: Secondary | ICD-10-CM | POA: Diagnosis not present

## 2022-09-09 DIAGNOSIS — E669 Obesity, unspecified: Secondary | ICD-10-CM | POA: Diagnosis not present

## 2022-09-09 DIAGNOSIS — J039 Acute tonsillitis, unspecified: Secondary | ICD-10-CM | POA: Diagnosis not present

## 2022-09-09 DIAGNOSIS — R051 Acute cough: Secondary | ICD-10-CM | POA: Diagnosis not present

## 2022-09-09 DIAGNOSIS — Z6830 Body mass index (BMI) 30.0-30.9, adult: Secondary | ICD-10-CM | POA: Diagnosis not present

## 2022-10-01 ENCOUNTER — Ambulatory Visit (INDEPENDENT_AMBULATORY_CARE_PROVIDER_SITE_OTHER): Payer: Medicare Other | Admitting: Family Medicine

## 2022-10-01 ENCOUNTER — Encounter: Payer: Self-pay | Admitting: Family Medicine

## 2022-10-01 VITALS — BP 149/80 | HR 91 | Temp 98.1°F | Ht 64.0 in | Wt 173.0 lb

## 2022-10-01 DIAGNOSIS — R300 Dysuria: Secondary | ICD-10-CM

## 2022-10-01 DIAGNOSIS — N3001 Acute cystitis with hematuria: Secondary | ICD-10-CM

## 2022-10-01 LAB — URINALYSIS, ROUTINE W REFLEX MICROSCOPIC
Bilirubin, UA: NEGATIVE
Glucose, UA: NEGATIVE
Ketones, UA: NEGATIVE
Nitrite, UA: POSITIVE — AB
Specific Gravity, UA: 1.02 (ref 1.005–1.030)
Urobilinogen, Ur: 0.2 mg/dL (ref 0.2–1.0)
pH, UA: 6.5 (ref 5.0–7.5)

## 2022-10-01 LAB — MICROSCOPIC EXAMINATION
RBC, Urine: NONE SEEN /hpf (ref 0–2)
Renal Epithel, UA: NONE SEEN /hpf
WBC, UA: >30 /hpf — AB (ref 0–5)

## 2022-10-01 MED ORDER — SULFAMETHOXAZOLE-TRIMETHOPRIM 800-160 MG PO TABS
1.0000 | ORAL_TABLET | Freq: Two times a day (BID) | ORAL | 0 refills | Status: AC
Start: 2022-10-01 — End: 2022-10-08

## 2022-10-01 NOTE — Progress Notes (Signed)
Subjective:  Patient ID: Rita Payne, female    DOB: [birth year], 77 y.o.   MRN: [id number]  Patient Care Team: Mechele Claude, MD as PCP - General (Family Medicine) Marcelino Duster, MD as Referring Physician (Dermatology) Durene Romans, MD as Consulting Physician (Orthopedic Surgery)   Chief Complaint:  Dysuria and Urinary Frequency (Started last night )   HPI: Rita Payne is a 77 y.o. female presenting on 10/01/2022 for Dysuria and Urinary Frequency (Started last night )   Dysuria  This is a new problem. The current episode started yesterday. The problem occurs every urination. The problem has been gradually worsening. The quality of the pain is described as aching and burning. The pain is moderate. There has been no fever. She is Not sexually active. There is No history of pyelonephritis. Associated symptoms include frequency and urgency. Pertinent negatives include no chills, discharge, flank pain, hematuria, hesitancy, nausea, possible pregnancy, sweats or vomiting. She has tried increased fluids for the symptoms. The treatment provided no relief. Her past medical history is significant for recurrent UTIs.  Urinary Frequency  This is a new problem. The current episode started yesterday. The problem occurs every urination. The problem has been gradually worsening. The quality of the pain is described as aching and burning. The pain is moderate. There has been no fever. She is Not sexually active. There is No history of pyelonephritis. Associated symptoms include frequency and urgency. Pertinent negatives include no chills, discharge, flank pain, hematuria, hesitancy, nausea, possible pregnancy, sweats or vomiting. She has tried increased fluids for the symptoms. The treatment provided no relief. Her past medical history is significant for recurrent UTIs.     Relevant past medical, surgical, family, and social history reviewed and updated as indicated.  Allergies and medications  reviewed and updated. Data reviewed: Chart in Epic.   Past Medical History:  Diagnosis Date   Allergy    Arthritis    Basal cell carcinoma    Cataract    Chronic kidney disease    left kidney tumor removed, benign   Diarrhea    First degree heart block    GERD (gastroesophageal reflux disease)    Hyperlipidemia    Hypertension    Leg fracture    Melanoma (HCC)    Mild intermittent asthma    Neuromuscular disorder (HCC)    neuropathy feet - mild   Pneumonia    SUI (stress urinary incontinence, female)    Type 2 diabetes mellitus (HCC)     Past Surgical History:  Procedure Laterality Date   ABDOMINAL HYSTERECTOMY  1987   BELPHAROPTOSIS REPAIR Bilateral 2009   9    CHOLECYSTECTOMY  1989   COLONOSCOPY     HERNIA REPAIR     incisional hernia from cholecystectomy   kidney tumor removal   1986   KNEE ARTHROSCOPY Bilateral    LUMBAR LAMINECTOMY/DECOMPRESSION MICRODISCECTOMY N/A 12/08/2013   Procedure: LUMBAR DECOMPRESSION L4-L5,  L3-L4 ;  Surgeon: Javier Docker, MD;  Location: [facility];  Service: Orthopedics;  Laterality: N/A;   POLYPECTOMY     PUBOVAGINAL SLING N/A 10/03/2014   Procedure: Leonides Grills;  Surgeon: Jethro Bolus, MD;  Location: [facility];  Service: Urology;  Laterality: N/A;   REPLACEMENT TOTAL HIP W/  RESURFACING IMPLANTS Right    TONSILLECTOMY     TOTAL HIP ARTHROPLASTY Right 08/25/2012   Procedure: RIGHT TOTAL HIP ARTHROPLASTY ANTERIOR APPROACH;  Surgeon: Shelda Pal, MD;  Location: [facility];  Service: Orthopedics;  Laterality: Right;   TOTAL KNEE ARTHROPLASTY Left 04/08/2022   Procedure: TOTAL KNEE ARTHROPLASTY;  Surgeon: Ollen Gross, MD;  Location: [facility];  Service: Orthopedics;  Laterality: Left;    Social History   Socioeconomic History   Marital status: Widowed    Spouse name: Not on file   Number of children: 2   Years of education: some college   Highest education level: Some college, no degree  Occupational  History   Occupation: IT consultant     Comment: 3 days per wk/ [employer]; 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 Son in [place], daughter in [place]. 2 sisters live nearby   Social Determinants of Health   Financial Resource Strain: Low Risk  (07/03/2022)   Overall Financial Resource Strain (CARDIA)    Difficulty of Paying Living Expenses: Not hard at all  Recent Concern: Financial Resource Strain - Medium Risk (04/29/2022)   Overall Financial Resource Strain (CARDIA)    Difficulty of Paying Living Expenses: Somewhat hard  Food Insecurity: No Food Insecurity (07/03/2022)   Hunger Vital Sign    Worried About Running Out of Food in the Last Year: Never true    Ran Out of Food in the Last Year: Never true  Transportation Needs: No Transportation Needs (07/03/2022)   PRAPARE - Administrator, Civil Service (Medical): No    Lack of Transportation (Non-Medical): No  Physical Activity: Insufficiently Active (07/03/2022)   Exercise Vital Sign    Days of Exercise per Week: 3 days    Minutes of Exercise per Session: 30 min  Stress: No Stress Concern Present (07/03/2022)   [employer] of Occupational Health - Occupational Stress Questionnaire    Feeling of Stress : Not at all  Social Connections: Socially Isolated (07/03/2022)   Social Connection and Isolation Panel [NHANES]    Frequency of Communication with Friends and Family: More  than three times a week    Frequency of Social Gatherings with Friends and Family: More than three times a week    Attends Religious Services: Never    Database administrator or Organizations: No    Attends Banker Meetings: Never    Marital Status: Widowed  Intimate Partner Violence: Not At Risk (07/03/2022)   Humiliation, Afraid, Rape, and Kick questionnaire    Fear of Current or Ex-Partner: No    Emotionally Abused: No    Physically Abused: No    Sexually Abused: No    Outpatient Encounter Medications as of 10/01/2022  Medication Sig   albuterol (VENTOLIN HFA) 108 (90 Base) MCG/ACT inhaler USE 2 INHALATIONS EVERY 6 HOURS AS NEEDED FOR WHEEZING OR SHORTNESS OF BREATH, NEED FOR CHRONIC ASTHMA   atorvastatin (LIPITOR) 80 MG tablet Take 1 tablet (80 mg total) by mouth daily.   cetirizine (ZYRTEC) 10 MG tablet Take 1  tablet (10 mg total) by mouth daily.   cholecalciferol (VITAMIN D) 1000 UNITS tablet Take 1,000 Units by mouth daily.   cloNIDine (CATAPRES) 0.1 MG tablet Take 1 tablet (0.1 mg total) by mouth 2 (two) times daily.   Dulaglutide (TRULICITY) 1.5 MG/0.5ML SOPN INJECT THE CONTENTS OF 1 PEN UNDER THE SKIN WEEKLY   Estradiol (YUVAFEM) 10 MCG TABS vaginal tablet INSERT 1 TABLET VAGINALLY TWICE A WEEK   Fluticasone-Salmeterol (ADVAIR DISKUS) 250-50 MCG/DOSE AEPB USE 1 INHALATION EVERY 12 HOURS   glucose blood (FREESTYLE LITE) test strip Use as instructed   ibandronate (BONIVA) 150 MG tablet Take 1 tablet (150 mg total) by mouth every 30 (thirty) days. Take in the morning with a full glass of water, on an empty stomach, and do not take anything else by mouth or lie down for the next 30 min.   MAGNESIUM PO Take 420 mg by mouth daily.   omeprazole (PRILOSEC) 40 MG capsule TAKE 1 CAPSULE DAILY   sulfamethoxazole-trimethoprim (BACTRIM DS) 800-160 MG tablet Take 1 tablet by mouth 2 (two) times daily for 7 days.   triamterene-hydrochlorothiazide (MAXZIDE-25) 37.5-25 MG tablet Take  1 tablet by mouth daily.   Facility-Administered Encounter Medications as of 10/01/2022  Medication   magnesium citrate solution 1 Bottle   sodium phosphate (FLEET) 7-19 GM/118ML enema 1 enema    Allergies  Allergen Reactions   Ivp Dye [Iodinated Contrast Media] Anaphylaxis   Shellfish Allergy Anaphylaxis   Vasotec [Enalapril] Shortness Of Breath   Adhesive [Tape] Other (See Comments)    blisters   Amlodipine Swelling   Gabapentin Other (See Comments)    drowsiness   Iodine Other (See Comments)    Iodine that is applied to skin causes blisters    Pyridium [Phenazopyridine Hcl]     blisters    Review of Systems  Constitutional:  Negative for activity change, appetite change, chills, diaphoresis, fatigue, fever and unexpected weight change.  Respiratory:  Negative for cough and shortness of breath.   Cardiovascular:  Negative for chest pain, palpitations and leg swelling.  Gastrointestinal:  Negative for abdominal distention, abdominal pain, anal bleeding, blood in stool, constipation, diarrhea, nausea, rectal pain and vomiting.  Genitourinary:  Positive for dysuria, frequency and urgency. Negative for decreased urine volume, difficulty urinating, enuresis, flank pain, hematuria, hesitancy, vaginal bleeding, vaginal discharge and vaginal pain.  Musculoskeletal:  Negative for arthralgias, back pain and myalgias.  Neurological:  Negative for weakness.  Psychiatric/Behavioral:  Negative for confusion.   All other systems reviewed and are negative.       Objective:  BP (!) 149/80   Pulse 91   Temp 98.1 F (36.7 C) (Temporal)   Ht 5\' 4"  (1.626 m)   Wt 173 lb (78.5 kg)   LMP 04/14/1982   SpO2 92%   BMI 29.70 kg/m    Wt Readings from Last 3 Encounters:  10/01/22 173 lb (78.5 kg)  07/24/22 179 lb 9.6 oz (81.5 kg)  07/03/22 175 lb (79.4 kg)    Physical Exam Vitals and nursing note reviewed.  Constitutional:      General: She is not in acute distress.    Appearance:  Normal appearance. She is well-developed and well-groomed. She is not ill-appearing, toxic-appearing or diaphoretic.  HENT:     Head: Normocephalic and atraumatic.     Jaw: There is normal jaw occlusion.     Right Ear: Hearing normal.     Left Ear: Hearing normal.     Nose: Nose normal.  Mouth/Throat:     Lips: Pink.     Mouth: Mucous membranes are moist.     Pharynx: Oropharynx is clear. Uvula midline.  Eyes:     General: Lids are normal.     Extraocular Movements: Extraocular movements intact.     Conjunctiva/sclera: Conjunctivae normal.     Pupils: Pupils are equal, round, and reactive to light.  Neck:     Thyroid: No thyroid mass, thyromegaly or thyroid tenderness.     Vascular: No carotid bruit or JVD.     Trachea: Trachea and phonation normal.  Cardiovascular:     Rate and Rhythm: Normal rate and regular rhythm.     Chest Wall: PMI is not displaced.     Pulses: Normal pulses.     Heart sounds: Normal heart sounds. No murmur heard.    No friction rub. No gallop.  Pulmonary:     Effort: Pulmonary effort is normal. No respiratory distress.     Breath sounds: Normal breath sounds. No wheezing.  Abdominal:     General: Bowel sounds are normal. There is no distension or abdominal bruit.     Palpations: Abdomen is soft. There is no hepatomegaly or splenomegaly.     Tenderness: There is no abdominal tenderness. There is no right CVA tenderness or left CVA tenderness.     Hernia: No hernia is present.  Musculoskeletal:        General: Normal range of motion.     Cervical back: Normal range of motion and neck supple.     Right lower leg: No edema.     Left lower leg: No edema.  Lymphadenopathy:     Cervical: No cervical adenopathy.  Skin:    General: Skin is warm and dry.     Capillary Refill: Capillary refill takes less than 2 seconds.     Coloration: Skin is not cyanotic, jaundiced or pale.     Findings: No rash.  Neurological:     General: No focal deficit present.      Mental Status: She is alert and oriented to person, place, and time.     Sensory: Sensation is intact.     Motor: Motor function is intact.     Coordination: Coordination is intact.     Gait: Gait is intact.     Deep Tendon Reflexes: Reflexes are normal and symmetric.  Psychiatric:        Attention and Perception: Attention and perception normal.        Mood and Affect: Mood and affect normal.        Speech: Speech normal.        Behavior: Behavior normal. Behavior is cooperative.        Thought Content: Thought content normal.        Cognition and Memory: Cognition and memory normal.        Judgment: Judgment normal.     Results for orders placed or performed in visit on 07/03/22  CBC with Differential/Platelet  Result Value Ref Range   WBC 7.9 3.4 - 10.8 x10E3/uL   RBC 4.36 3.77 - 5.28 x10E6/uL   Hemoglobin 14.0 11.1 - 15.9 g/dL   Hematocrit 16.1 09.6 - 46.6 %   MCV 94 79 - 97 fL   MCH 32.1 26.6 - 33.0 pg   MCHC 34.1 31.5 - 35.7 g/dL   RDW 04.5 40.9 - 81.1 %   Platelets 146 (L) 150 - 450 x10E3/uL   Neutrophils 59 Not Estab. %  Lymphs 26 Not Estab. %   Monocytes 10 Not Estab. %   Eos 4 Not Estab. %   Basos 1 Not Estab. %   Neutrophils Absolute 4.7 1.4 - 7.0 x10E3/uL   Lymphocytes Absolute 2.1 0.7 - 3.1 x10E3/uL   Monocytes Absolute 0.8 0.1 - 0.9 x10E3/uL   EOS (ABSOLUTE) 0.3 0.0 - 0.4 x10E3/uL   Basophils Absolute 0.1 0.0 - 0.2 x10E3/uL   Immature Granulocytes 0 Not Estab. %   Immature Grans (Abs) 0.0 0.0 - 0.1 x10E3/uL  Bayer DCA Hb A1c Waived  Result Value Ref Range   HB A1C (BAYER DCA - WAIVED) 5.8 (H) 4.8 - 5.6 %  CMP14+EGFR  Result Value Ref Range   Glucose 112 (H) 70 - 99 mg/dL   BUN 21 8 - 27 mg/dL   Creatinine, Ser 1.66 0.57 - 1.00 mg/dL   eGFR 77 >06 TK/ZSW/1.09   BUN/Creatinine Ratio 26 12 - 28   Sodium 139 134 - 144 mmol/L   Potassium 4.8 3.5 - 5.2 mmol/L   Chloride 99 96 - 106 mmol/L   CO2 24 20 - 29 mmol/L   Calcium 9.9 8.7 - 10.3 mg/dL   Total  Protein 6.8 6.0 - 8.5 g/dL   Albumin 4.4 3.8 - 4.8 g/dL   Globulin, Total 2.4 1.5 - 4.5 g/dL   Bilirubin Total 0.3 0.0 - 1.2 mg/dL   Alkaline Phosphatase 67 44 - 121 IU/L   AST 19 0 - 40 IU/L   ALT 18 0 - 32 IU/L  Lipid panel  Result Value Ref Range   Cholesterol, Total 216 (H) 100 - 199 mg/dL   Triglycerides 323 (H) 0 - 149 mg/dL   HDL 34 (L) >55 mg/dL   VLDL Cholesterol Cal 83 (H) 5 - 40 mg/dL   LDL Chol Calc (NIH) 99 0 - 99 mg/dL   Chol/HDL Ratio 6.4 (H) 0.0 - 4.4 ratio       Pertinent labs & imaging results that were available during my care of the patient were reviewed by me and considered in my medical decision making.  Assessment & Plan:  Rita Payne" was seen today for dysuria and urinary frequency.  Diagnoses and all orders for this visit:  Dysuria Urinalysis in office significant for acute cystitis. Culture added. Will treat with below based on previous cultures. No red flags concerning for acute pyelonephritis.  -     Urinalysis, Routine w reflex microscopic  Acute cystitis with hematuria -     Urine Culture -     sulfamethoxazole-trimethoprim (BACTRIM DS) 800-160 MG tablet; Take 1 tablet by mouth 2 (two) times daily for 7 days.     Continue all other maintenance medications.  Follow up plan: Return if symptoms worsen or fail to improve.   Continue healthy lifestyle choices, including diet (rich in fruits, vegetables, and lean proteins, and low in salt and simple carbohydrates) and exercise (at least 30 minutes of moderate physical activity daily).  Educational handout given for UTI  The above assessment and management plan was discussed with the patient. The patient verbalized understanding of and has agreed to the management plan. Patient is aware to call the clinic if they develop any new symptoms or if symptoms persist or worsen. Patient is aware when to return to the clinic for a follow-up visit. Patient educated on when it is appropriate to go to the  emergency department.   Kari Baars, FNP-C [facility] [phone]

## 2022-10-02 ENCOUNTER — Ambulatory Visit: Payer: Medicare Other | Admitting: Family Medicine

## 2022-10-03 LAB — URINE CULTURE

## 2022-10-04 LAB — URINE CULTURE

## 2022-10-07 ENCOUNTER — Encounter: Payer: Self-pay | Admitting: Family Medicine

## 2022-10-07 ENCOUNTER — Ambulatory Visit (INDEPENDENT_AMBULATORY_CARE_PROVIDER_SITE_OTHER): Payer: Medicare Other | Admitting: Family Medicine

## 2022-10-07 VITALS — BP 120/75 | HR 86 | Temp 97.5°F | Ht 64.0 in | Wt 170.6 lb

## 2022-10-07 DIAGNOSIS — I10 Essential (primary) hypertension: Secondary | ICD-10-CM | POA: Diagnosis not present

## 2022-10-07 DIAGNOSIS — E1169 Type 2 diabetes mellitus with other specified complication: Secondary | ICD-10-CM | POA: Diagnosis not present

## 2022-10-07 DIAGNOSIS — Z7985 Long-term (current) use of injectable non-insulin antidiabetic drugs: Secondary | ICD-10-CM

## 2022-10-07 DIAGNOSIS — E785 Hyperlipidemia, unspecified: Secondary | ICD-10-CM | POA: Diagnosis not present

## 2022-10-07 DIAGNOSIS — K219 Gastro-esophageal reflux disease without esophagitis: Secondary | ICD-10-CM | POA: Diagnosis not present

## 2022-10-07 DIAGNOSIS — Z23 Encounter for immunization: Secondary | ICD-10-CM

## 2022-10-07 DIAGNOSIS — E119 Type 2 diabetes mellitus without complications: Secondary | ICD-10-CM | POA: Diagnosis not present

## 2022-10-07 LAB — LIPID PANEL

## 2022-10-07 LAB — BAYER DCA HB A1C WAIVED: HB A1C (BAYER DCA - WAIVED): 5.8 % — ABNORMAL HIGH (ref 4.8–5.6)

## 2022-10-07 MED ORDER — TRULICITY 1.5 MG/0.5ML ~~LOC~~ SOAJ: SUBCUTANEOUS | 3 refills | Status: DC

## 2022-10-07 MED ORDER — CETIRIZINE HCL 10 MG PO TABS: 10.0000 mg | ORAL_TABLET | Freq: Every day | ORAL | 3 refills | Status: DC

## 2022-10-07 MED ORDER — TRIAMTERENE-HCTZ 37.5-25 MG PO TABS: 1.0000 | ORAL_TABLET | Freq: Every day | ORAL | 3 refills | Status: DC

## 2022-10-07 MED ORDER — OMEPRAZOLE 40 MG PO CPDR: 40.0000 mg | DELAYED_RELEASE_CAPSULE | Freq: Every day | ORAL | 3 refills | Status: DC

## 2022-10-07 NOTE — Addendum Note (Signed)
Addended by: Adella Hare B on: 10/07/2022 05:03 PM   Modules accepted: Orders

## 2022-10-07 NOTE — Progress Notes (Signed)
Subjective:  Patient ID: Rita Payne,  female    DOB: 1946-01-08  Age: 77 y.o.    CC: Medical Management of Chronic Issues   HPI Rosena Zeno presents for  follow-up of hypertension. Patient has no history of headache chest pain or shortness of breath or recent cough. Patient also denies symptoms of TIA such as numbness weakness lateralizing. Patient denies side effects from medication. States taking it regularly.  Patient also  in for follow-up of elevated cholesterol. Doing well without complaints on current medication. Denies side effects  including myalgia and arthralgia and nausea. Also in today for liver function testing. Currently no chest pain, shortness of breath or other cardiovascular related symptoms noted.  Follow-up of diabetes. Patient does check blood sugar at home. Readings run between 110 and 130 fasting.  Patient denies symptoms such as excessive hunger or urinary frequency, excessive hunger, nausea No significant hypoglycemic spells noted. Medications reviewed. Pt reports taking them regularly. Pt. denies complication/adverse reaction today.   Asthma flares when allergy to goldenrod and ragweed in the fall.    History Maitland has a past medical history of Allergy, Arthritis, Basal cell carcinoma, Cataract, Chronic kidney disease, Diarrhea, First degree heart block, GERD (gastroesophageal reflux disease), Hyperlipidemia, Hypertension, Leg fracture, Melanoma (HCC), Mild intermittent asthma, Neuromuscular disorder (HCC), Pneumonia, SUI (stress urinary incontinence, female), and Type 2 diabetes mellitus (HCC).   She has a past surgical history that includes Tonsillectomy; Abdominal hysterectomy (1987); Cholecystectomy (1989); Knee arthroscopy (Bilateral); Hernia repair; kidney tumor removal  (1986); Blepharoptosis repair (Bilateral, 2009); Total hip arthroplasty (Right, 08/25/2012); Lumbar laminectomy/decompression microdiscectomy (N/A, 12/08/2013); Pubovaginal sling (N/A,  10/03/2014); Replacement total hip w/  resurfacing implants (Right); Colonoscopy; Polypectomy; and Total knee arthroplasty (Left, 04/08/2022).   Her family history includes Arrhythmia in her brother; Arthritis/Rheumatoid in her brother and sister; Colon polyps in her sister and sister; Diabetes in her mother, sister, and sister; Heart attack in her brother and mother; Heart disease in her brother, father, mother, and sister; Heart failure in her brother and sister; Stroke in her father, mother, and sister; Vision loss in her brother.She reports that she quit smoking about 6 months ago. Her smoking use included cigarettes. She started smoking about 33 years ago. She has a 8.3 pack-year smoking history. She has never used smokeless tobacco. She reports that she does not drink alcohol and does not use drugs.  Current Outpatient Medications on File Prior to Visit  Medication Sig Dispense Refill   albuterol (VENTOLIN HFA) 108 (90 Base) MCG/ACT inhaler USE 2 INHALATIONS EVERY 6 HOURS AS NEEDED FOR WHEEZING OR SHORTNESS OF BREATH, NEED FOR CHRONIC ASTHMA 17 g 6   atorvastatin (LIPITOR) 80 MG tablet Take 1 tablet (80 mg total) by mouth daily. 90 tablet 2   cholecalciferol (VITAMIN D) 1000 UNITS tablet Take 1,000 Units by mouth daily.     cloNIDine (CATAPRES) 0.1 MG tablet Take 1 tablet (0.1 mg total) by mouth 2 (two) times daily. 180 tablet 3   Estradiol (YUVAFEM) 10 MCG TABS vaginal tablet INSERT 1 TABLET VAGINALLY TWICE A WEEK 24 tablet 1   Fluticasone-Salmeterol (ADVAIR DISKUS) 250-50 MCG/DOSE AEPB USE 1 INHALATION EVERY 12 HOURS 180 each 3   glucose blood (FREESTYLE LITE) test strip Use as instructed 200 strip 3   ibandronate (BONIVA) 150 MG tablet Take 1 tablet (150 mg total) by mouth every 30 (thirty) days. Take in the morning with a full glass of water, on an empty stomach, and do not take anything else  by mouth or lie down for the next 30 min. 3 tablet 3   MAGNESIUM PO Take 420 mg by mouth daily.      Current Facility-Administered Medications on File Prior to Visit  Medication Dose Route Frequency Provider Last Rate Last Admin   magnesium citrate solution 1 Bottle  1 Bottle Oral Once Jethro Bolus, MD       sodium phosphate (FLEET) 7-19 GM/118ML enema 1 enema  1 enema Rectal Once Jethro Bolus, MD        ROS Review of Systems  Constitutional: Negative.   HENT: Negative.    Eyes:  Negative for visual disturbance.  Respiratory:  Negative for shortness of breath.   Cardiovascular:  Negative for chest pain.  Gastrointestinal:  Negative for abdominal pain.  Musculoskeletal:  Negative for arthralgias.    Objective:  BP 120/75   Pulse 86   Temp (!) 97.5 F (36.4 C)   Ht 5\' 4"  (1.626 m)   Wt 170 lb 9.6 oz (77.4 kg)   LMP 04/14/1982   SpO2 95%   BMI 29.28 kg/m   BP Readings from Last 3 Encounters:  10/07/22 120/75  10/01/22 (!) 149/80  07/24/22 138/72    Wt Readings from Last 3 Encounters:  10/07/22 170 lb 9.6 oz (77.4 kg)  10/01/22 173 lb (78.5 kg)  07/24/22 179 lb 9.6 oz (81.5 kg)     Physical Exam Constitutional:      General: She is not in acute distress.    Appearance: She is well-developed.  Cardiovascular:     Rate and Rhythm: Normal rate and regular rhythm.  Pulmonary:     Breath sounds: Normal breath sounds.  Musculoskeletal:        General: Normal range of motion.  Skin:    General: Skin is warm and dry.  Neurological:     Mental Status: She is alert and oriented to person, place, and time.     Diabetic Foot Exam - Simple   No data filed     Lab Results  Component Value Date   HGBA1C 5.8 (H) 07/03/2022   HGBA1C 6.6 (H) 03/29/2022   HGBA1C 7.0 (H) 12/26/2021    Assessment & Plan:   Percy Ludwick" was seen today for medical management of chronic issues.  Diagnoses and all orders for this visit:  Primary hypertension -     CBC with Differential/Platelet -     CMP14+EGFR  Type 2 diabetes mellitus without complication,  without long-term current use of insulin (HCC) -     Bayer DCA Hb A1c Waived -     Microalbumin / creatinine urine ratio  Hyperlipidemia associated with type 2 diabetes mellitus (HCC) -     Lipid panel  Gastroesophageal reflux disease without esophagitis -     omeprazole (PRILOSEC) 40 MG capsule; Take 1 capsule (40 mg total) by mouth daily.  Other orders -     cetirizine (ZYRTEC) 10 MG tablet; Take 1 tablet (10 mg total) by mouth daily. -     Dulaglutide (TRULICITY) 1.5 MG/0.5ML SOPN; INJECT THE CONTENTS OF 1 PEN UNDER THE SKIN WEEKLY -     triamterene-hydrochlorothiazide (MAXZIDE-25) 37.5-25 MG tablet; Take 1 tablet by mouth daily.   I have discontinued Ardelle Serpa "Sandy"'s sulfamethoxazole-trimethoprim. I have also changed her omeprazole. Additionally, I am having her maintain her cholecalciferol, Fluticasone-Salmeterol, MAGNESIUM PO, Yuvafem, FREESTYLE LITE, ibandronate, atorvastatin, cloNIDine, albuterol, cetirizine, Trulicity, and triamterene-hydrochlorothiazide.  Meds ordered this encounter  Medications   cetirizine (ZYRTEC) 10 MG  tablet    Sig: Take 1 tablet (10 mg total) by mouth daily.    Dispense:  90 tablet    Refill:  3   Dulaglutide (TRULICITY) 1.5 MG/0.5ML SOPN    Sig: INJECT THE CONTENTS OF 1 PEN UNDER THE SKIN WEEKLY    Dispense:  6 mL    Refill:  3   omeprazole (PRILOSEC) 40 MG capsule    Sig: Take 1 capsule (40 mg total) by mouth daily.    Dispense:  90 capsule    Refill:  3   triamterene-hydrochlorothiazide (MAXZIDE-25) 37.5-25 MG tablet    Sig: Take 1 tablet by mouth daily.    Dispense:  90 tablet    Refill:  3    **Patient requests 90 days supply**     Follow-up: Return in about 3 months (around 01/06/2023).  Mechele Claude, M.D.

## 2022-10-08 LAB — CMP14+EGFR
ALT: 18 [IU]/L (ref 0–32)
AST: 20 [IU]/L (ref 0–40)
Albumin: 4.5 g/dL (ref 3.8–4.8)
Alkaline Phosphatase: 74 [IU]/L (ref 44–121)
BUN/Creatinine Ratio: 22 (ref 12–28)
BUN: 17 mg/dL (ref 8–27)
Bilirubin Total: 0.5 mg/dL (ref 0.0–1.2)
CO2: 22 mmol/L (ref 20–29)
Calcium: 9.9 mg/dL (ref 8.7–10.3)
Chloride: 102 mmol/L (ref 96–106)
Creatinine, Ser: 0.79 mg/dL (ref 0.57–1.00)
Globulin, Total: 2.4 g/dL (ref 1.5–4.5)
Glucose: 113 mg/dL — ABNORMAL HIGH (ref 70–99)
Potassium: 4.8 mmol/L (ref 3.5–5.2)
Sodium: 140 mmol/L (ref 134–144)
Total Protein: 6.9 g/dL (ref 6.0–8.5)
eGFR: 78 mL/min/{1.73_m2} (ref >59)

## 2022-10-08 LAB — CBC WITH DIFFERENTIAL/PLATELET
Basophils Absolute: 0 10*3/uL (ref 0.0–0.2)
Basos: 1 %
EOS (ABSOLUTE): 0.2 10*3/uL (ref 0.0–0.4)
Eos: 2 %
Hematocrit: 45.4 % (ref 34.0–46.6)
Hemoglobin: 14.4 g/dL (ref 11.1–15.9)
Immature Grans (Abs): 0 10*3/uL (ref 0.0–0.1)
Immature Granulocytes: 0 %
Lymphocytes Absolute: 1.9 10*3/uL (ref 0.7–3.1)
Lymphs: 23 %
MCH: 30.3 pg (ref 26.6–33.0)
MCHC: 31.7 g/dL (ref 31.5–35.7)
MCV: 96 fL (ref 79–97)
Monocytes Absolute: 0.8 10*3/uL (ref 0.1–0.9)
Monocytes: 10 %
Neutrophils Absolute: 5.3 10*3/uL (ref 1.4–7.0)
Neutrophils: 64 %
Platelets: 182 10*3/uL (ref 150–450)
RBC: 4.75 x10E6/uL (ref 3.77–5.28)
RDW: 13.9 % (ref 11.7–15.4)
WBC: 8.3 10*3/uL (ref 3.4–10.8)

## 2022-10-08 LAB — LIPID PANEL
Cholesterol, Total: 127 mg/dL (ref 100–199)
HDL: 36 mg/dL — ABNORMAL LOW (ref >39)
LDL CALC COMMENT:: 3.5 ratio (ref 0.0–4.4)
LDL Chol Calc (NIH): 59 mg/dL (ref 0–99)
Triglycerides: 190 mg/dL — ABNORMAL HIGH (ref 0–149)
VLDL Cholesterol Cal: 32 mg/dL (ref 5–40)

## 2022-10-08 LAB — MICROALBUMIN / CREATININE URINE RATIO
Creatinine, Urine: 138.3 mg/dL
Microalb/Creat Ratio: 27 mg/g{creat} (ref 0–29)
Microalbumin, Urine: 37.7 ug/mL

## 2022-10-08 NOTE — Progress Notes (Signed)
Hello Rita Payne,  Your lab result is normal and/or stable.Some minor variations that are not significant are commonly marked abnormal, but do not represent any medical problem for you.  Best regards, Claretta Fraise, M.D.

## 2022-10-14 ENCOUNTER — Inpatient Hospital Stay
Admission: RE | Admit: 2022-10-14 | Discharge: 2022-10-14 | Disposition: A | Discharge status: Home / Self Care | Payer: TRICARE For Life (TFL) | Source: Ambulatory Visit | Attending: Family Medicine | Admitting: Family Medicine

## 2022-10-14 DIAGNOSIS — Z1231 Encounter for screening mammogram for malignant neoplasm of breast: Secondary | ICD-10-CM

## 2022-10-31 ENCOUNTER — Other Ambulatory Visit: Payer: Self-pay | Admitting: Family Medicine

## 2022-11-04 DIAGNOSIS — Z6830 Body mass index (BMI) 30.0-30.9, adult: Secondary | ICD-10-CM | POA: Diagnosis not present

## 2022-11-04 DIAGNOSIS — N3001 Acute cystitis with hematuria: Secondary | ICD-10-CM | POA: Diagnosis not present

## 2022-11-04 DIAGNOSIS — I10 Essential (primary) hypertension: Secondary | ICD-10-CM | POA: Diagnosis not present

## 2022-11-04 DIAGNOSIS — E669 Obesity, unspecified: Secondary | ICD-10-CM | POA: Diagnosis not present

## 2022-12-02 ENCOUNTER — Other Ambulatory Visit: Payer: Self-pay | Admitting: Family Medicine

## 2023-01-13 ENCOUNTER — Ambulatory Visit: Payer: Medicare Other | Admitting: Family Medicine

## 2023-01-28 DIAGNOSIS — Z85828 Personal history of other malignant neoplasm of skin: Secondary | ICD-10-CM | POA: Diagnosis not present

## 2023-01-28 DIAGNOSIS — L821 Other seborrheic keratosis: Secondary | ICD-10-CM | POA: Diagnosis not present

## 2023-01-28 DIAGNOSIS — L814 Other melanin hyperpigmentation: Secondary | ICD-10-CM | POA: Diagnosis not present

## 2023-02-12 ENCOUNTER — Ambulatory Visit: Payer: Medicare Other | Admitting: Family Medicine

## 2023-02-12 DIAGNOSIS — H8303 Labyrinthitis, bilateral: Secondary | ICD-10-CM | POA: Diagnosis not present

## 2023-02-12 DIAGNOSIS — H8302 Labyrinthitis, left ear: Secondary | ICD-10-CM | POA: Diagnosis not present

## 2023-03-17 ENCOUNTER — Ambulatory Visit (INDEPENDENT_AMBULATORY_CARE_PROVIDER_SITE_OTHER): Payer: Medicare Other | Admitting: Family Medicine

## 2023-03-17 ENCOUNTER — Other Ambulatory Visit: Payer: Self-pay | Admitting: Family Medicine

## 2023-03-17 ENCOUNTER — Encounter: Payer: Self-pay | Admitting: Family Medicine

## 2023-03-17 VITALS — BP 146/74 | HR 85 | Temp 97.2°F | Ht 64.0 in | Wt 176.0 lb

## 2023-03-17 DIAGNOSIS — I152 Hypertension secondary to endocrine disorders: Secondary | ICD-10-CM

## 2023-03-17 DIAGNOSIS — H8302 Labyrinthitis, left ear: Secondary | ICD-10-CM

## 2023-03-17 DIAGNOSIS — E1159 Type 2 diabetes mellitus with other circulatory complications: Secondary | ICD-10-CM

## 2023-03-17 DIAGNOSIS — Z7985 Long-term (current) use of injectable non-insulin antidiabetic drugs: Secondary | ICD-10-CM | POA: Diagnosis not present

## 2023-03-17 DIAGNOSIS — E1169 Type 2 diabetes mellitus with other specified complication: Secondary | ICD-10-CM

## 2023-03-17 DIAGNOSIS — E119 Type 2 diabetes mellitus without complications: Secondary | ICD-10-CM | POA: Diagnosis not present

## 2023-03-17 DIAGNOSIS — E785 Hyperlipidemia, unspecified: Secondary | ICD-10-CM | POA: Diagnosis not present

## 2023-03-17 LAB — CMP14+EGFR
ALT: 20 IU/L (ref 0–32)
AST: 25 IU/L (ref 0–40)
Albumin: 4.2 g/dL (ref 3.8–4.8)
Alkaline Phosphatase: 78 IU/L (ref 44–121)
BUN/Creatinine Ratio: 26 (ref 12–28)
BUN: 19 mg/dL (ref 8–27)
Bilirubin Total: 0.5 mg/dL (ref 0.0–1.2)
CO2: 24 mmol/L (ref 20–29)
Calcium: 9.7 mg/dL (ref 8.7–10.3)
Chloride: 102 mmol/L (ref 96–106)
Creatinine, Ser: 0.74 mg/dL (ref 0.57–1.00)
Globulin, Total: 2.1 g/dL (ref 1.5–4.5)
Glucose: 132 mg/dL — ABNORMAL HIGH (ref 70–99)
Potassium: 4.5 mmol/L (ref 3.5–5.2)
Sodium: 140 mmol/L (ref 134–144)
Total Protein: 6.3 g/dL (ref 6.0–8.5)
eGFR: 84 mL/min/{1.73_m2} (ref >59)

## 2023-03-17 LAB — CBC WITH DIFFERENTIAL/PLATELET
Basophils Absolute: 0 10*3/uL (ref 0.0–0.2)
Basos: 1 %
EOS (ABSOLUTE): 0.1 10*3/uL (ref 0.0–0.4)
Eos: 2 %
Hematocrit: 44.5 % (ref 34.0–46.6)
Hemoglobin: 15 g/dL (ref 11.1–15.9)
Immature Grans (Abs): 0 10*3/uL (ref 0.0–0.1)
Immature Granulocytes: 0 %
Lymphocytes Absolute: 1.9 10*3/uL (ref 0.7–3.1)
Lymphs: 24 %
MCH: 31.7 pg (ref 26.6–33.0)
MCHC: 33.7 g/dL (ref 31.5–35.7)
MCV: 94 fL (ref 79–97)
Monocytes Absolute: 0.7 10*3/uL (ref 0.1–0.9)
Monocytes: 9 %
Neutrophils Absolute: 5.2 10*3/uL (ref 1.4–7.0)
Neutrophils: 64 %
Platelets: 168 10*3/uL (ref 150–450)
RBC: 4.73 x10E6/uL (ref 3.77–5.28)
RDW: 13.3 % (ref 11.7–15.4)
WBC: 8 10*3/uL (ref 3.4–10.8)

## 2023-03-17 LAB — LIPID PANEL
Chol/HDL Ratio: 3.4 ratio (ref 0.0–4.4)
Cholesterol, Total: 133 mg/dL (ref 100–199)
HDL: 39 mg/dL — ABNORMAL LOW (ref >39)
LDL Chol Calc (NIH): 56 mg/dL (ref 0–99)
Triglycerides: 240 mg/dL — ABNORMAL HIGH (ref 0–149)
VLDL Cholesterol Cal: 38 mg/dL (ref 5–40)

## 2023-03-17 LAB — BAYER DCA HB A1C WAIVED: HB A1C (BAYER DCA - WAIVED): 6.1 % — ABNORMAL HIGH (ref 4.8–5.6)

## 2023-03-17 MED ORDER — TRULICITY 1.5 MG/0.5ML ~~LOC~~ SOAJ: SUBCUTANEOUS | 3 refills | Status: DC

## 2023-03-17 MED ORDER — ATORVASTATIN CALCIUM 80 MG PO TABS: 80.0000 mg | ORAL_TABLET | Freq: Every day | ORAL | 2 refills | Status: DC

## 2023-03-17 MED ORDER — FLUTICASONE PROPIONATE 50 MCG/ACT NA SUSP: 2.0000 | Freq: Every day | NASAL | 5 refills | Status: DC

## 2023-03-17 NOTE — Progress Notes (Signed)
 Subjective:  Patient ID: Rita Payne,  female    DOB: 31-May-1945  Age: 78 y.o.    CC: Medical Management of Chronic Issues, Sleep Apnea (Son states that she stops breathing while sleeping. ), and Ear Drainage (Feels like she still has fluid in left ear.  Feels dizzy when she leans a certain way. Pt states she had libriumthesis. Prednisone and ondansetron are doen. Still takign flonase.  )   HPI Lucile Hillmann presents for  follow-up of hypertension. Patient has no history of headache chest pain or shortness of breath or recent cough. Patient also denies symptoms of TIA such as numbness weakness lateralizing. Patient denies side effects from medication. States taking it regularly.  Patient also  in for follow-up of elevated cholesterol. Doing well without complaints on current medication. Denies side effects  including myalgia and arthralgia and nausea. Also in today for liver function testing. Currently no chest pain, shortness of breath or other cardiovascular related symptoms noted.  Follow-up of diabetes. Patient does check blood sugar at home. Readings run between 110 and 145 fasting Patient denies symptoms such as excessive hunger or urinary frequency, excessive hunger, nausea No significant hypoglycemic spells noted. Medications reviewed. Pt reports taking them regularly. Pt. denies complication/adverse reaction today.    History Eriel has a past medical history of Allergy, Arthritis, Basal cell carcinoma, Cataract, Chronic kidney disease, Diarrhea, First degree heart block, GERD (gastroesophageal reflux disease), Hyperlipidemia, Hypertension, Leg fracture, Melanoma (HCC), Mild intermittent asthma, Neuromuscular disorder (HCC), Pneumonia, SUI (stress urinary incontinence, female), and Type 2 diabetes mellitus (HCC).   She has a past surgical history that includes Tonsillectomy; Abdominal hysterectomy (1987); Cholecystectomy (1989); Knee arthroscopy (Bilateral); Hernia repair; kidney  tumor removal  (1986); Blepharoptosis repair (Bilateral, 2009); Total hip arthroplasty (Right, 08/25/2012); Lumbar laminectomy/decompression microdiscectomy (N/A, 12/08/2013); Pubovaginal sling (N/A, 10/03/2014); Replacement total hip w/  resurfacing implants (Right); Colonoscopy; Polypectomy; and Total knee arthroplasty (Left, 04/08/2022).   Her family history includes Arrhythmia in her brother; Arthritis/Rheumatoid in her brother and sister; Colon polyps in her sister and sister; Diabetes in her mother, sister, and sister; Heart attack in her brother and mother; Heart disease in her brother, father, mother, and sister; Heart failure in her brother and sister; Stroke in her father, mother, and sister; Vision loss in her brother.She reports that she quit smoking about a year ago. Her smoking use included cigarettes. She started smoking about 34 years ago. She has a 8.3 pack-year smoking history. She has never used smokeless tobacco. She reports that she does not drink alcohol and does not use drugs.  Current Outpatient Medications on File Prior to Visit  Medication Sig Dispense Refill   albuterol (VENTOLIN HFA) 108 (90 Base) MCG/ACT inhaler USE 2 INHALATIONS EVERY 6 HOURS AS NEEDED FOR WHEEZING OR SHORTNESS OF BREATH, NEED FOR CHRONIC ASTHMA 17 g 6   cetirizine (ZYRTEC) 10 MG tablet Take 1 tablet (10 mg total) by mouth daily. 90 tablet 3   cholecalciferol (VITAMIN D) 1000 UNITS tablet Take 1,000 Units by mouth daily.     cloNIDine (CATAPRES) 0.1 MG tablet Take 1 tablet (0.1 mg total) by mouth 2 (two) times daily. 180 tablet 3   Estradiol (YUVAFEM) 10 MCG TABS vaginal tablet INSERT 1 TABLET VAGINALLY TWICE A WEEK 24 tablet 1   Fluticasone-Salmeterol (ADVAIR DISKUS) 250-50 MCG/DOSE AEPB USE 1 INHALATION EVERY 12 HOURS 180 each 3   glucose blood (FREESTYLE LITE) test strip Use as instructed 200 strip 3   ibandronate (BONIVA) 150 MG  tablet Take 1 tablet (150 mg total) by mouth every 30 (thirty) days. Take in  the morning with a full glass of water, on an empty stomach, and do not take anything else by mouth or lie down for the next 30 min. 3 tablet 3   MAGNESIUM PO Take 420 mg by mouth daily.     omeprazole (PRILOSEC) 40 MG capsule Take 1 capsule (40 mg total) by mouth daily. 90 capsule 3   triamterene-hydrochlorothiazide (MAXZIDE-25) 37.5-25 MG tablet TAKE 1 TABLET BY MOUTH DAILY 90 tablet 0   Current Facility-Administered Medications on File Prior to Visit  Medication Dose Route Frequency Provider Last Rate Last Admin   magnesium citrate solution 1 Bottle  1 Bottle Oral Once Jethro Bolus, MD       sodium phosphate (FLEET) 7-19 GM/118ML enema 1 enema  1 enema Rectal Once Jethro Bolus, MD        ROS Review of Systems  Constitutional: Negative.   HENT:  Positive for ear pain (funny feeling AS. if bending to the side gets dizzy).   Eyes:  Negative for visual disturbance.  Respiratory:  Negative for shortness of breath.   Cardiovascular:  Negative for chest pain.  Gastrointestinal:  Negative for abdominal pain.  Musculoskeletal:  Negative for arthralgias.  Neurological:  Positive for dizziness (vertigo/ labyrinthitis).    Objective:  BP (!) 146/74   Pulse 85   Temp (!) 97.2 F (36.2 C)   Ht 5\' 4"  (1.626 m)   Wt 176 lb (79.8 kg)   LMP 04/14/1982   SpO2 95%   BMI 30.21 kg/m   BP Readings from Last 3 Encounters:  03/17/23 (!) 146/74  10/07/22 120/75  10/01/22 (!) 149/80    Wt Readings from Last 3 Encounters:  03/17/23 176 lb (79.8 kg)  10/07/22 170 lb 9.6 oz (77.4 kg)  10/01/22 173 lb (78.5 kg)     Physical Exam Constitutional:      General: She is not in acute distress.    Appearance: She is well-developed.  HENT:     Right Ear: Tympanic membrane and ear canal normal.     Left Ear: Tympanic membrane and ear canal normal.  Cardiovascular:     Rate and Rhythm: Normal rate and regular rhythm.  Pulmonary:     Breath sounds: Normal breath sounds.   Musculoskeletal:        General: Normal range of motion.  Skin:    General: Skin is warm and dry.  Neurological:     Mental Status: She is alert and oriented to person, place, and time.     Diabetic Foot Exam - Simple   No data filed     Lab Results  Component Value Date   HGBA1C 5.8 (H) 10/07/2022   HGBA1C 5.8 (H) 07/03/2022   HGBA1C 6.6 (H) 03/29/2022    Assessment & Plan:   Carollyn Etcheverry" was seen today for medical management of chronic issues, sleep apnea and ear drainage.  Diagnoses and all orders for this visit:  Type 2 diabetes mellitus without complication, without long-term current use of insulin (HCC) -     Bayer DCA Hb A1c Waived  Hyperlipidemia associated with type 2 diabetes mellitus (HCC) -     Lipid panel  Hypertension associated with type 2 diabetes mellitus (HCC) -     CMP14+EGFR -     CBC with Differential/Platelet  Labyrinthitis of left ear  Other orders -     atorvastatin (LIPITOR) 80 MG tablet; Take  1 tablet (80 mg total) by mouth daily. -     Dulaglutide (TRULICITY) 1.5 MG/0.5ML SOAJ; INJECT THE CONTENTS OF 1 PEN UNDER THE SKIN WEEKLY -     fluticasone (FLONASE) 50 MCG/ACT nasal spray; Place 2 sprays into both nostrils daily.   I have changed Dois Davenport Laminack "Sandy"'s Trulicity. I am also having her maintain her cholecalciferol, Fluticasone-Salmeterol, MAGNESIUM PO, Yuvafem, FREESTYLE LITE, ibandronate, cloNIDine, albuterol, cetirizine, omeprazole, triamterene-hydrochlorothiazide, atorvastatin, and fluticasone.  Meds ordered this encounter  Medications   atorvastatin (LIPITOR) 80 MG tablet    Sig: Take 1 tablet (80 mg total) by mouth daily.    Dispense:  90 tablet    Refill:  2   Dulaglutide (TRULICITY) 1.5 MG/0.5ML SOAJ    Sig: INJECT THE CONTENTS OF 1 PEN UNDER THE SKIN WEEKLY    Dispense:  6 mL    Refill:  3   fluticasone (FLONASE) 50 MCG/ACT nasal spray    Sig: Place 2 sprays into both nostrils daily.    Dispense:  48 mL    Refill:   5     Follow-up: No follow-ups on file.  Mechele Claude, M.D.

## 2023-03-17 NOTE — Telephone Encounter (Signed)
 Copied from CRM [id number]. Topic: Clinical - Prescription Issue >> Mar 17, 2023 11:14 AM Patsy Lager T wrote: Reason for CRM: patient stated the script for atorvastatin (LIPITOR) 80 MG tablet and Dulaglutide (TRULICITY) 1.5 MG/0.5ML SOAJ were both sent to the wrong pharmacy. They should be going to [facility] - [place], [place] - [address], [place] [address] Phone: [phone]  Fax: [phone]   Please transfer script to the correct pharmacy

## 2023-03-18 ENCOUNTER — Telehealth: Payer: Self-pay | Admitting: Family Medicine

## 2023-03-18 ENCOUNTER — Encounter: Payer: Self-pay | Admitting: Family Medicine

## 2023-03-18 NOTE — Telephone Encounter (Signed)
 Copied from CRM [id number]. Topic: Referral - Request for Referral >> Mar 18, 2023  4:12 PM Eunice Blase wrote: Did the patient discuss referral with their provider in the last year? Yes (If No - schedule appointment) (If Yes - send message)  Appointment offered? Yes  Type of order/referral and detailed reason for visit: Pt has sleep apnea   Preference of office, provider, location: Pt's area.  If referral order, have you been seen by this specialty before? No (If Yes, this issue or another issue? When? Where?  Can we respond through MyChart? Yes

## 2023-03-20 ENCOUNTER — Telehealth: Payer: Self-pay | Admitting: Family Medicine

## 2023-03-20 NOTE — Telephone Encounter (Unsigned)
 Copied from CRM [id number]. Topic: General - Other >> Mar 20, 2023 11:52 AM Shelah Lewandowsky wrote: Reason for CRM: Patient returning call from office- gave message about labs- patient had no questions

## 2023-03-22 ENCOUNTER — Other Ambulatory Visit: Payer: Self-pay | Admitting: Family Medicine

## 2023-03-22 DIAGNOSIS — R0683 Snoring: Secondary | ICD-10-CM

## 2023-03-22 NOTE — Telephone Encounter (Signed)
Referral placed, as requested WS 

## 2023-03-24 NOTE — Telephone Encounter (Signed)
Pt notified.    LS

## 2023-03-31 NOTE — Telephone Encounter (Unsigned)
 Copied from CRM [id number]. Topic: Clinical - Prescription Issue >> Mar 31, 2023  3:48 PM Gildardo Pounds wrote: Reason for CRM: Patient received the wrong medication from the pharmacy, and needs a 7 day supply of celecoxib (CELEBREX) capsule 200 mg  sent to [facility] and a new prescription for the medication sent to [facility]. Callback number is [phone]

## 2023-03-31 NOTE — Telephone Encounter (Signed)
 Copied from CRM [id number]. Topic: Clinical - Medication Refill >> Mar 31, 2023  3:44 PM Gildardo Pounds wrote: Most Recent Primary Care Visit:   Medication: celecoxib (CELEBREX) capsule 200 mg   Has the patient contacted their pharmacy? Yes (Agent: If no, request that the patient contact the pharmacy for the refill. If patient does not wish to contact the pharmacy document the reason why and proceed with request.) (Agent: If yes, when and what did the pharmacy advise?)Patient received wrong medication from pharmacy  Is this the correct pharmacy for this prescription? Yes If no, delete pharmacy and type the correct one.  This is the patient's preferred pharmacy:  [facility] [phone] - [place], [place] - [address] AT [facility] [address] [place] [address] Phone: [phone] Fax: [phone]    Has the prescription been filled recently? No  Is the patient out of the medication? Yes  Has the patient been seen for an appointment in the last year OR does the patient have an upcoming appointment? Yes  Can we respond through MyChart? Yes  Agent: Please be advised that Rx refills may take up to 3 business days. We ask that you follow-up with your pharmacy.

## 2023-03-31 NOTE — Telephone Encounter (Signed)
 Please review and advise.

## 2023-04-01 ENCOUNTER — Other Ambulatory Visit: Payer: Self-pay | Admitting: Family Medicine

## 2023-04-01 MED ORDER — CELECOXIB 200 MG PO CAPS: ORAL_CAPSULE | ORAL | 0 refills | Status: DC

## 2023-04-01 MED ORDER — CELECOXIB 200 MG PO CAPS: ORAL_CAPSULE | ORAL | 3 refills | Status: DC

## 2023-04-01 NOTE — Telephone Encounter (Signed)
 Please let the patient know that I sent their prescription to their pharmacy. Thanks, WS

## 2023-04-02 NOTE — Telephone Encounter (Signed)
 Pt informed    LS

## 2023-04-28 ENCOUNTER — Encounter: Payer: Self-pay | Admitting: Neurology

## 2023-04-28 ENCOUNTER — Ambulatory Visit (INDEPENDENT_AMBULATORY_CARE_PROVIDER_SITE_OTHER): Admitting: Neurology

## 2023-04-28 VITALS — BP 162/84 | HR 79 | Ht 64.0 in | Wt 177.8 lb

## 2023-04-28 DIAGNOSIS — R0683 Snoring: Secondary | ICD-10-CM

## 2023-04-28 DIAGNOSIS — G4733 Obstructive sleep apnea (adult) (pediatric): Secondary | ICD-10-CM | POA: Diagnosis not present

## 2023-04-28 DIAGNOSIS — E66811 Obesity, class 1: Secondary | ICD-10-CM | POA: Diagnosis not present

## 2023-04-28 DIAGNOSIS — R0681 Apnea, not elsewhere classified: Secondary | ICD-10-CM

## 2023-04-28 DIAGNOSIS — Z820 Family history of epilepsy and other diseases of the nervous system: Secondary | ICD-10-CM | POA: Diagnosis not present

## 2023-04-28 DIAGNOSIS — Z9189 Other specified personal risk factors, not elsewhere classified: Secondary | ICD-10-CM

## 2023-04-28 NOTE — Patient Instructions (Signed)

## 2023-04-28 NOTE — Progress Notes (Signed)
 Subjective:    Patient ID: Rita Payne is a 78 y.o. female.  HPI    Rita Fairy, MD, PhD [facility] [address] [address] [place], [place] [address]  Dear Dr. Veleta Gerold,  I saw your patient, Rita Payne, upon your kind request in my sleep clinic today for initial consultation of her sleep disorder, in particular, concern for underlying obstructive sleep apnea.  The patient is accompanied by her sister, Rita Payne, today.  As you know, Ms. Warburton is a 78 year old female with an underlying medical history of allergies, arthritis, chronic kidney disease, history of first-degree heart block, hypertension, hyperlipidemia, melanoma, intermittent asthma, type 2 diabetes, and mild obesity, who reports snoring and excessive daytime somnolence as well as witnessed apneas, per son's report.  Her Epworth sleepiness score is 9 out of 24, fatigue severity score is 24 out of 63.  I reviewed your office note from 03/17/2023. Of note, she had a sleep study over 10 years ago and was diagnosed with obstructive sleep apnea at the time and was given a CPAP machine or AutoPap machine.  She struggled with her sleep at the time because she had severe pain from hip arthritis and did not get comfortable with her PAP machine, used it for about 6 months.  She would be willing to get retested and consider PAP therapy again if the need arises.  She would prefer a home sleep test.  She lives alone, she has 2 dogs in the household.  Bedtime is late, between midnight and 1:30 AM and rise time is around 6 AM.  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  She had a total hip replacement on the right side about 10 years ago and total knee replacement on the left side in 2024.  She has no nightly nocturia, denies recurrent morning headaches.  Her other sister had sleep apnea, she had a PAP machine, she passed away.  Patient quit smoking last year.  She does not drink any alcohol, she drinks caffeine in form of tea  and soda, about 4-5 servings per day on average.  She does not watch TV in her bedroom.    Her Past Medical History Is Significant For: Past Medical History:  Diagnosis Date   Allergy    Arthritis    Basal cell carcinoma    Cataract    Chronic kidney disease    left kidney tumor removed, benign   Diarrhea    First degree heart block    GERD (gastroesophageal reflux disease)    Hyperlipidemia    Hypertension    Leg fracture    Melanoma (HCC)    Mild intermittent asthma    Neuromuscular disorder (HCC)    neuropathy feet - mild   Pneumonia    SUI (stress urinary incontinence, female)    Type 2 diabetes mellitus (HCC)     Her Past Surgical History Is Significant For: Past Surgical History:  Procedure Laterality Date   ABDOMINAL HYSTERECTOMY  1987   BELPHAROPTOSIS REPAIR Bilateral 2009   11    CHOLECYSTECTOMY  1989   COLONOSCOPY     HERNIA REPAIR     incisional hernia from cholecystectomy   kidney tumor removal   1986   KNEE ARTHROSCOPY Bilateral    LUMBAR LAMINECTOMY/DECOMPRESSION MICRODISCECTOMY N/A 12/08/2013   Procedure: LUMBAR DECOMPRESSION L4-L5,  L3-L4 ;  Surgeon: Loel Ring, MD;  Location: [facility];  Service: Orthopedics;  Laterality: N/A;   POLYPECTOMY  PUBOVAGINAL SLING N/A 10/03/2014   Procedure: Gino Lais;  Surgeon: Annamarie Kid, MD;  Location: [facility];  Service: Urology;  Laterality: N/A;   REPLACEMENT TOTAL HIP W/  RESURFACING IMPLANTS Right    TONSILLECTOMY     TOTAL HIP ARTHROPLASTY Right 08/25/2012   Procedure: RIGHT TOTAL HIP ARTHROPLASTY ANTERIOR APPROACH;  Surgeon: Bevin Bucks, MD;  Location: [facility];  Service: Orthopedics;  Laterality: Right;   TOTAL KNEE ARTHROPLASTY Left 04/08/2022   Procedure: TOTAL KNEE ARTHROPLASTY;  Surgeon: Liliane Rei, MD;  Location: [facility];  Service: Orthopedics;  Laterality: Left;    Her Family History Is Significant For: Family History  Problem Relation Age of Onset   Diabetes  Mother    Heart disease Mother    Stroke Mother    Heart attack Mother    Heart disease Father    Stroke Father    Diabetes Sister    Heart disease Sister    Heart failure Sister    Diabetes Sister    Stroke Sister    Colon polyps Sister    Arthritis/Rheumatoid Sister    Colon polyps Sister    Vision loss Brother    Arthritis/Rheumatoid Brother    Heart disease Brother    Heart failure Brother    Heart attack Brother    Arrhythmia Brother    Colon cancer Neg Hx    Esophageal cancer Neg Hx    Stomach cancer Neg Hx    Rectal cancer Neg Hx    Breast cancer Neg Hx     Her Social History Is Significant For: Social History   Socioeconomic History   Marital status: Widowed    Spouse name: Not on file   Number of children: 2   Years of education: some college   Highest education level: Some college, no degree  Occupational History   Occupation: IT consultant     Comment: 3 days per wk/ [employer]; 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 Son in [place] , daughter in [place]. 2 sisters live nearby   Social Drivers of Health   Financial Resource Strain: Low Risk  (01/09/2023)   Overall Financial Resource Strain (CARDIA)    Difficulty of Paying Living Expenses: Not very hard  Food Insecurity: No Food Insecurity (01/09/2023)   Hunger Vital Sign    Worried About Running Out of Food in the Last Year: Never true    Ran Out of Food in the  Last Year: Never true  Transportation Needs: No Transportation Needs (01/09/2023)   PRAPARE - Administrator, Civil Service (Medical): No    Lack of Transportation (Non-Medical): No  Physical Activity: Insufficiently Active (01/09/2023)   Exercise Vital Sign    Days of Exercise per Week: 2 days    Minutes of Exercise per Session: 30 min  Stress:  No Stress Concern Present (01/09/2023)   [employer] of Occupational Health - Occupational Stress Questionnaire    Feeling of Stress : Only a little  Social Connections: Moderately Isolated (01/09/2023)   Social Connection and Isolation Panel [NHANES]    Frequency of Communication with Friends and Family: Three times a week    Frequency of Social Gatherings with Friends and Family: Once a week    Attends Religious Services: 1 to 4 times per year    Active Member of [employer] or Organizations: No    Attends Banker Meetings: Never    Marital Status: Widowed    Her Allergies Are:  Allergies  Allergen Reactions   Ivp Dye [Iodinated Contrast Media] Anaphylaxis   Shellfish Allergy Anaphylaxis   Vasotec [Enalapril] Shortness Of Breath   Adhesive [Tape] Other (See Comments)    blisters   Amlodipine  Swelling   Gabapentin  Other (See Comments)    drowsiness   Iodine  Other (See Comments)    Iodine  that is applied to skin causes blisters    Pyridium [Phenazopyridine Hcl]     blisters  :   Her Current Medications Are:  Outpatient Encounter Medications as of 04/28/2023  Medication Sig   albuterol  (VENTOLIN  HFA) 108 (90 Base) MCG/ACT inhaler USE 2 INHALATIONS EVERY 6 HOURS AS NEEDED FOR WHEEZING OR SHORTNESS OF BREATH, NEED FOR CHRONIC ASTHMA   atorvastatin  (LIPITOR ) 80 MG tablet Take 1 tablet (80 mg total) by mouth daily.   celecoxib  (CELEBREX ) 200 MG capsule TAKE 1 CAPSULE EVERY 12 HOURS   cetirizine  (ZYRTEC ) 10 MG tablet Take 1 tablet (10 mg total) by mouth daily.   cholecalciferol  (VITAMIN D ) 1000 UNITS tablet Take 1,000  Units by mouth daily.   cloNIDine  (CATAPRES ) 0.1 MG tablet Take 1 tablet (0.1 mg total) by mouth 2 (two) times daily.   Dulaglutide  (TRULICITY ) 1.5 MG/0.5ML SOAJ INJECT THE CONTENTS OF 1 PEN UNDER THE SKIN WEEKLY   Estradiol  (YUVAFEM ) 10 MCG TABS vaginal tablet INSERT 1 TABLET VAGINALLY TWICE A WEEK   fluticasone  (FLONASE ) 50 MCG/ACT nasal spray Place 2 sprays into both nostrils daily.   Fluticasone -Salmeterol (ADVAIR DISKUS) 250-50 MCG/DOSE AEPB USE 1 INHALATION EVERY 12 HOURS   glucose blood (FREESTYLE LITE) test strip Use as instructed   ibandronate  (BONIVA ) 150 MG tablet Take 1 tablet (150 mg total) by mouth every 30 (thirty) days. Take in the morning with a full glass of water , on an empty stomach, and do not take anything else by mouth or lie down for the next 30 min.   MAGNESIUM  PO Take 420 mg by mouth daily.   omeprazole  (PRILOSEC) 40 MG capsule Take 1 capsule (40 mg total) by mouth daily.   triamterene -hydrochlorothiazide  (MAXZIDE-25) 37.5-25 MG tablet TAKE 1 TABLET BY MOUTH DAILY   Facility-Administered Encounter Medications as of 04/28/2023  Medication   magnesium  citrate solution 1 Bottle   sodium phosphate  (FLEET) 7-19 GM/118ML enema 1 enema  :   Review of Systems:  Out of a complete 14 point review of systems, all are reviewed and negative with the exception of these symptoms as listed below:   Review of Systems  Neurological:        Room 5 Pt is here with her Sister. Pt states that he son had came to visit her and she was sleeping in a recliner and she went to sleep and he told her that she had stopped breathing and that she needed to get checked out. ESS 9 FSS 24  Objective:  Neurological Exam  Physical Exam Physical Examination:   Vitals:   04/28/23 1137  BP: (!) 162/84  Pulse: 79    General Examination: The patient is a very pleasant 78 y.o. female in no acute distress. She appears well-developed and well-nourished and well groomed.   HEENT:  Normocephalic, atraumatic, pupils are equal, round and reactive to light, extraocular tracking is good without limitation to gaze excursion or nystagmus noted. Hearing is grossly intact. Face is symmetric with normal facial animation. Speech is clear with no dysarthria noted. There is no hypophonia. There is no lip, neck/head, jaw or voice tremor. Neck is supple with full range of passive and active motion. There are no carotid bruits on auscultation. Oropharynx exam reveals: Moderate mouth dryness, full dentures in place, tonsils.  Mallampati class III.  Neck circumference 15-7/8 inches, moderate airway crowding secondary to small airway entry.  Tongue protrudes centrally and palate elevates symmetrically.    Chest: Clear to auscultation without wheezing, rhonchi or crackles noted.  Heart: S1+S2+0, regular and normal without murmurs, rubs or gallops noted.   Abdomen: Soft, non-tender and non-distended.  Extremities: There is trace pitting edema in the left ankle area.   Skin: Warm and dry without trophic changes noted.   Musculoskeletal: exam reveals left knee wider compared to the right.    Neurologically:  Mental status: The patient is awake, alert and oriented in all 4 spheres. Her immediate and remote memory, attention, language skills and fund of knowledge are appropriate. There is no evidence of aphasia, agnosia, apraxia or anomia. Speech is clear with normal prosody and enunciation. Thought process is linear. Mood is normal and affect is normal.  Cranial nerves II - XII are as described above under HEENT exam.  Motor exam: Normal bulk, strength and tone is noted. There is no obvious action or resting tremor.  Fine motor skills and coordination: grossly intact.  Cerebellar testing: No dysmetria or intention tremor. There is no truncal or gait ataxia.  Sensory exam: intact to light touch in the upper and lower extremities.  Gait, station and balance: She stands without difficulty, she  walks without a walking aid.  Assessment and Plan:  In summary, Rita Payne is a very pleasant 78 y.o.-year old female with an underlying medical history of allergies, arthritis, chronic kidney disease, history of first-degree heart block, hypertension, hyperlipidemia, melanoma, intermittent asthma, type 2 diabetes, and mild obesity, whose history and physical exam are concerning for sleep disordered breathing, particularly obstructive sleep apnea (OSA).  While a laboratory attended sleep study is typically considered "gold standard" for evaluation of sleep disordered breathing, the patient would prefer a home sleep test, especially since she has 2 dogs in the household that she cannot believe overnight.   I had a long chat with the patient and her sister about my findings and the diagnosis of sleep apnea, particularly OSA, its prognosis and treatment options. We talked about medical/conservative treatments, surgical interventions and non-pharmacological approaches for symptom control. I explained, in particular, the risks and ramifications of untreated moderate to severe OSA, especially with respect to developing cardiovascular disease down the road, including congestive heart failure (CHF), difficult to treat hypertension, cardiac arrhythmias (particularly A-fib), neurovascular complications including TIA, stroke and dementia. Even type 2 diabetes has, in part, been linked to untreated OSA. Symptoms of untreated OSA may include (but may not be limited to) daytime sleepiness, nocturia (i.e. frequent nighttime urination), memory problems, mood irritability and suboptimally controlled or worsening mood disorder such as  depression and/or anxiety, lack of energy, lack of motivation, physical discomfort, as well as recurrent headaches, especially morning or nocturnal headaches. We talked about the importance of maintaining a healthy lifestyle and striving for healthy weight. In addition, we talked about the  importance of striving for and maintaining good sleep hygiene.  She is encouraged to scale back on her caffeine intake and try to make more time for sleep. I recommended a sleep study at this time. I outlined the differences between a laboratory attended sleep study which is considered more comprehensive and accurate over the option of a home sleep test (HST); the latter may lead to underestimation of sleep disordered breathing in some instances and does not help with diagnosing upper airway resistance syndrome and is not accurate enough to diagnose primary central sleep apnea typically. I outlined possible surgical and non-surgical treatment options of OSA, including the use of a positive airway pressure (PAP) device (i.e. CPAP, AutoPAP/APAP or BiPAP in certain circumstances), a custom-made dental device (aka oral appliance, which would require a referral to a specialist dentist or orthodontist typically, and is generally speaking not considered for patients with full dentures or edentulous state), upper airway surgical options, such as traditional UPPP (which is not considered a first-line treatment) or the Inspire device (hypoglossal nerve stimulator, which would involve a referral for consultation with an ENT surgeon, after careful selection, following inclusion criteria - also not first-line treatment). I explained the PAP treatment option to the patient in detail, as this is generally considered first-line treatment.  The patient indicated that she would be willing to try PAP therapy, if the need arises. I explained the importance of being compliant with PAP treatment, not only for insurance purposes but primarily to improve patient's symptoms symptoms, and for the patient's long term health benefit, including to reduce Her cardiovascular risks longer-term.    We will pick up our discussion about the next steps and treatment options after testing.  We will keep her posted as to the test results by phone  call and/or MyChart messaging where possible.  We will plan to follow-up in sleep clinic accordingly as well.  I answered all their questions today and the patient and her sister were in agreement.   I encouraged her to call with any interim questions, concerns, problems or updates or email us  through MyChart.  Generally speaking, sleep test authorizations may take up to 2 weeks, sometimes less, sometimes longer, the patient is encouraged to get in touch with us  if they do not hear back from the sleep lab staff directly within the next 2 weeks.  Thank you very much for allowing me to participate in the care of this nice patient. If I can be of any further assistance to you please do not hesitate to call me at [phone].  Sincerely,   Rita Fairy, MD, PhD

## 2023-05-07 DIAGNOSIS — E119 Type 2 diabetes mellitus without complications: Secondary | ICD-10-CM | POA: Diagnosis not present

## 2023-05-09 ENCOUNTER — Ambulatory Visit: Admitting: Neurology

## 2023-05-09 DIAGNOSIS — G4733 Obstructive sleep apnea (adult) (pediatric): Secondary | ICD-10-CM | POA: Diagnosis not present

## 2023-05-09 DIAGNOSIS — R0681 Apnea, not elsewhere classified: Secondary | ICD-10-CM

## 2023-05-09 DIAGNOSIS — Z820 Family history of epilepsy and other diseases of the nervous system: Secondary | ICD-10-CM

## 2023-05-09 DIAGNOSIS — Z9189 Other specified personal risk factors, not elsewhere classified: Secondary | ICD-10-CM

## 2023-05-09 DIAGNOSIS — R0683 Snoring: Secondary | ICD-10-CM

## 2023-05-09 DIAGNOSIS — E66811 Obesity, class 1: Secondary | ICD-10-CM

## 2023-05-23 NOTE — Progress Notes (Signed)
 See procedure note.

## 2023-05-26 ENCOUNTER — Ambulatory Visit: Payer: Self-pay | Admitting: Neurology

## 2023-05-26 DIAGNOSIS — G4733 Obstructive sleep apnea (adult) (pediatric): Secondary | ICD-10-CM

## 2023-05-26 NOTE — Procedures (Signed)
 [facility]  HOME SLEEP TEST (SANSA) REPORT (Mail-Out Device):   STUDY DATE: 05/11/2023  DOB: 10-03-1945  MRN: [id number]  ORDERING CLINICIAN: Debbra Fairy, MD, PhD   REFERRING CLINICIAN: Roise Cleaver, MD   CLINICAL INFORMATION/HISTORY: 78 year old female with an underlying medical history of allergies, arthritis, chronic kidney disease, history of first-degree heart block, hypertension, hyperlipidemia, melanoma, intermittent asthma, type 2 diabetes, and mild obesity, who reports snoring and excessive daytime somnolence as well as witnessed apneas.   PATIENT'S LAST REPORTED EPWORTH SLEEPINESS SCORE (ESS): 9/24.  BMI (at the time of sleep clinic visit and/or test date): 30.5 kg/m  FINDINGS:   Study Protocol:    The SANSA single-point-of-skin-contact chest-worn sensor - an FDA cleared and DOT approved type 4 home sleep test device - measures eight physiological channels,  including blood oxygen saturation (measured via PPG [photoplethysmography]), EKG-derived heart rate, respiratory effort, chest movement (measured via accelerometer), snoring, body position, and actigraphy. The device is designed to be worn for up to 10 hours per study.   Sleep Summary:   Total Recording Time (hours, min): 6 hours, 39 min  Total Effective Sleep Time (hours, min):  4 hours, 49 min  Sleep Efficiency (%):    83%   Respiratory Indices:   Calculated sAHI (per hour):  11.6/hour         Oxygen Saturation Statistics:    Oxygen Saturation (%) Mean: 92.6%   Minimum oxygen saturation (%):                 71.9%   O2 Saturation Range (%): 71.9-98.1%   Time below or at 88% saturation: 17 min   Pulse Rate Statistics:   Pulse Mean (bpm):    73/min    Pulse Range (64-103/min)   Snoring: Intermittent, mild  IMPRESSION/DIAGNOSES:   OSA (obstructive sleep apnea), mild    RECOMMENDATIONS:   This home sleep test demonstrates overall mild obstructive sleep apnea with a  total AHI of 11.6/hour and O2 nadir of 71.9%. Snoring was detected, intermittently in the mild range. Given the patient's medical history and sleep related complaints, therapy with a  positive airway pressure device is a reasonable first-line choice and clinically recommended. Treatment can be achieved in the form of autoPAP trial/titration at home for now. A full night, in-lab PAP titration study may aid in improving proper treatment settings and with mask fit, if needed, down the road. Alternative treatments may include weight loss (where appropriate) along with avoidance of the supine sleep position (if possible), or an oral appliance in appropriate candidates.   Please note that untreated obstructive sleep apnea may carry additional perioperative morbidity. Patients with significant obstructive sleep apnea should receive perioperative PAP therapy and the surgeons and particularly the anesthesiologist should be informed of the diagnosis and the severity of the sleep disordered breathing. The patient should be cautioned not to drive, work at heights, or operate dangerous or heavy equipment when tired or sleepy. Review and reiteration of good sleep hygiene measures should be pursued with any patient. Other causes of the patient's symptoms, including circadian rhythm disturbances, an underlying mood disorder, medication effect and/or an underlying medical problem cannot be ruled out based on this test. Clinical correlation is recommended.  The patient and her referring provider will be notified of the test results. The patient will be seen in follow up in sleep clinic at [facility], as necessary.  I certify that I have reviewed the raw data recording prior to the issuance of this report  in accordance with the standards of the American Academy of Sleep Medicine (AASM).    INTERPRETING PHYSICIAN:   Debbra Fairy, MD, PhD Medical Director, [facility]) Diplomat, ABPN  (Neurology and Sleep)   [facility] [address], [address] [place], [place] [address] [phone]

## 2023-05-29 NOTE — Telephone Encounter (Signed)
 Spoke to patient gave sleep study results Pt chose [facility] has DME # Gave patient [facility] # Pt informed of insurance compliance. Made patient initial f/u visit with Jessica,NP 08/2023 (VV) Pt expressed understanding and thanked me for calling sent orders to [facility] this evening and forward sleep results to PCP

## 2023-05-29 NOTE — Telephone Encounter (Signed)
-----   Message from Debbra Fairy sent at 05/26/2023  6:18 PM EDT ----- Patient referred by PCP for reevaluation of her OSA, she has not been on PAP therapy for years.  I saw her on 04/28/2023 and she had a home sleep test on 04/28/2023 and she had a home sleep test on 05/11/2023. Please call and notify the patient that the recent home sleep test showed obstructive sleep apnea. OSA is overall mild, but worth treating to see if she feels better after treatment.  She did have some deeper oxygen desaturations during sleep.  To that end I recommend treatment in the form of autoPAP, which means, that we don't have to bring her in for a sleep study with CPAP, but will let her try an autoPAP machine at home, through a DME company (of her choice, or as per insurance requirement). The DME representative will educate her on how to use the machine, how to put the mask on, etc. I have placed an order in the chart. Please send referral, talk to patient, send report to referring MD. We will need a FU in sleep clinic in about 2.-3 months post-PAP set up (which is usually an insurance-mandated appointment to monitor compliance), please arrange that with me or one of our NPs. Please also go over the need for compliance with treatment (including the insurance-imposed minimum compliance percentage). Thanks,   Debbra Fairy, MD, PhD [facility])

## 2023-05-29 NOTE — Telephone Encounter (Signed)
 Called pt (second attempt from our office) and LVM with office number and hours, asking for call back.

## 2023-06-13 DIAGNOSIS — Z96652 Presence of left artificial knee joint: Secondary | ICD-10-CM | POA: Diagnosis not present

## 2023-06-13 DIAGNOSIS — Z471 Aftercare following joint replacement surgery: Secondary | ICD-10-CM | POA: Diagnosis not present

## 2023-06-26 ENCOUNTER — Ambulatory Visit: Payer: Self-pay | Admitting: Family Medicine

## 2023-06-26 ENCOUNTER — Encounter: Payer: Self-pay | Admitting: Family Medicine

## 2023-06-26 ENCOUNTER — Ambulatory Visit (INDEPENDENT_AMBULATORY_CARE_PROVIDER_SITE_OTHER): Admitting: Family Medicine

## 2023-06-26 VITALS — BP 127/65 | HR 66 | Temp 98.3°F | Ht 64.0 in | Wt 178.0 lb

## 2023-06-26 DIAGNOSIS — I499 Cardiac arrhythmia, unspecified: Secondary | ICD-10-CM | POA: Diagnosis not present

## 2023-06-26 DIAGNOSIS — E1169 Type 2 diabetes mellitus with other specified complication: Secondary | ICD-10-CM

## 2023-06-26 DIAGNOSIS — I498 Other specified cardiac arrhythmias: Secondary | ICD-10-CM | POA: Diagnosis not present

## 2023-06-26 DIAGNOSIS — E1159 Type 2 diabetes mellitus with other circulatory complications: Secondary | ICD-10-CM | POA: Diagnosis not present

## 2023-06-26 DIAGNOSIS — I152 Hypertension secondary to endocrine disorders: Secondary | ICD-10-CM

## 2023-06-26 DIAGNOSIS — E785 Hyperlipidemia, unspecified: Secondary | ICD-10-CM

## 2023-06-26 DIAGNOSIS — E119 Type 2 diabetes mellitus without complications: Secondary | ICD-10-CM

## 2023-06-26 LAB — BAYER DCA HB A1C WAIVED: HB A1C (BAYER DCA - WAIVED): 6.5 % — ABNORMAL HIGH (ref 4.8–5.6)

## 2023-06-26 MED ORDER — HYDROCHLOROTHIAZIDE 12.5 MG PO CAPS: 12.5000 mg | ORAL_CAPSULE | Freq: Every day | ORAL | 1 refills | Status: DC

## 2023-06-26 NOTE — Progress Notes (Unsigned)
 Subjective:  Patient ID: Rita Payne,  female    DOB: July 31, 1945  Age: 78 y.o.    CC: Medical Management of Chronic Issues (3 month)   HPI Jayni Kruzel presents for  follow-up of hypertension. Patient has no history of headache chest pain or shortness of breath or recent cough. Patient also denies symptoms of TIA such as numbness weakness lateralizing. Patient denies side effects from medication. States taking it regularly.  Patient also  in for follow-up of elevated cholesterol. Doing well without complaints on current medication. Denies side effects  including myalgia and arthralgia and nausea. Also in today for liver function testing. Currently no chest pain, shortness of breath or other cardiovascular related symptoms noted.  Follow-up of diabetes. Patient does check blood sugar at home. Readings run between 130 and 150 fasting. Patient denies symptoms such as excessive hunger or urinary frequency, excessive hunger, nausea No significant hypoglycemic spells noted. Medications reviewed. Pt reports taking them regularly. Pt. denies complication/adverse reaction today.    History Mekenzie has a past medical history of Allergy, Arthritis, Basal cell carcinoma, Cataract, Chronic kidney disease, Diarrhea, First degree heart block, GERD (gastroesophageal reflux disease), Hyperlipidemia, Hypertension, Leg fracture, Melanoma (HCC), Mild intermittent asthma, Neuromuscular disorder (HCC), Pneumonia, SUI (stress urinary incontinence, female), and Type 2 diabetes mellitus (HCC).   She has a past surgical history that includes Tonsillectomy; Abdominal hysterectomy (1987); Cholecystectomy (1989); Knee arthroscopy (Bilateral); Hernia repair; kidney tumor removal  (1986); Blepharoptosis repair (Bilateral, 2009); Total hip arthroplasty (Right, 08/25/2012); Lumbar laminectomy/decompression microdiscectomy (N/A, 12/08/2013); Pubovaginal sling (N/A, 10/03/2014); Replacement total hip w/  resurfacing implants  (Right); Colonoscopy; Polypectomy; and Total knee arthroplasty (Left, 04/08/2022).   Her family history includes Arrhythmia in her brother; Arthritis/Rheumatoid in her brother and sister; Colon polyps in her sister and sister; Diabetes in her mother, sister, and sister; Heart attack in her brother and mother; Heart disease in her brother, father, mother, and sister; Heart failure in her brother and sister; Stroke in her father, mother, and sister; Vision loss in her brother.She reports that she quit smoking about 15 months ago. Her smoking use included cigarettes. She started smoking about 34 years ago. She has a 8.3 pack-year smoking history. She has never used smokeless tobacco. She reports that she does not drink alcohol and does not use drugs.  Current Outpatient Medications on File Prior to Visit  Medication Sig Dispense Refill   albuterol  (VENTOLIN  HFA) 108 (90 Base) MCG/ACT inhaler USE 2 INHALATIONS EVERY 6 HOURS AS NEEDED FOR WHEEZING OR SHORTNESS OF BREATH, NEED FOR CHRONIC ASTHMA 17 g 6   atorvastatin  (LIPITOR ) 80 MG tablet Take 1 tablet (80 mg total) by mouth daily. 90 tablet 2   celecoxib  (CELEBREX ) 200 MG capsule TAKE 1 CAPSULE EVERY 12 HOURS 14 capsule 0   cetirizine  (ZYRTEC ) 10 MG tablet Take 1 tablet (10 mg total) by mouth daily. 90 tablet 3   cholecalciferol  (VITAMIN D ) 1000 UNITS tablet Take 1,000 Units by mouth daily.     cloNIDine  (CATAPRES ) 0.1 MG tablet Take 1 tablet (0.1 mg total) by mouth 2 (two) times daily. 180 tablet 3   Dulaglutide  (TRULICITY ) 1.5 MG/0.5ML SOAJ INJECT THE CONTENTS OF 1 PEN UNDER THE SKIN WEEKLY 6 mL 3   Estradiol  (YUVAFEM ) 10 MCG TABS vaginal tablet INSERT 1 TABLET VAGINALLY TWICE A WEEK 24 tablet 1   fluticasone  (FLONASE ) 50 MCG/ACT nasal spray Place 2 sprays into both nostrils daily. 48 mL 5   Fluticasone -Salmeterol (ADVAIR DISKUS) 250-50 MCG/DOSE AEPB USE  1 INHALATION EVERY 12 HOURS 180 each 3   glucose blood (FREESTYLE LITE) test strip Use as instructed  200 strip 3   ibandronate  (BONIVA ) 150 MG tablet Take 1 tablet (150 mg total) by mouth every 30 (thirty) days. Take in the morning with a full glass of water , on an empty stomach, and do not take anything else by mouth or lie down for the next 30 min. 3 tablet 3   MAGNESIUM  PO Take 420 mg by mouth daily.     omeprazole  (PRILOSEC) 40 MG capsule Take 1 capsule (40 mg total) by mouth daily. 90 capsule 3   Current Facility-Administered Medications on File Prior to Visit  Medication Dose Route Frequency Provider Last Rate Last Admin   magnesium  citrate solution 1 Bottle  1 Bottle Oral Once Tannenbaum, Sigmund, MD       sodium phosphate  (FLEET) 7-19 GM/118ML enema 1 enema  1 enema Rectal Once Annamarie Kid, MD        ROS Review of Systems  Constitutional: Negative.   HENT: Negative.    Eyes:  Negative for visual disturbance.  Respiratory:  Negative for shortness of breath.   Cardiovascular:  Positive for leg swelling (mostly in ankles and feet, daily.). Negative for chest pain.  Gastrointestinal:  Negative for abdominal pain.  Musculoskeletal:  Negative for arthralgias.    Objective:  BP 127/65   Pulse 66   Temp 98.3 F (36.8 C)   Ht 5' 4 (1.626 m)   Wt 178 lb (80.7 kg)   LMP 04/14/1982   SpO2 96%   BMI 30.55 kg/m   BP Readings from Last 3 Encounters:  06/26/23 127/65  04/28/23 (!) 162/84  03/17/23 (!) 146/74    Wt Readings from Last 3 Encounters:  06/26/23 178 lb (80.7 kg)  04/28/23 177 lb 12.8 oz (80.6 kg)  03/17/23 176 lb (79.8 kg)    Lab Results  Component Value Date   HGBA1C 6.1 (H) 03/17/2023   HGBA1C 5.8 (H) 10/07/2022   HGBA1C 5.8 (H) 07/03/2022    Physical Exam Constitutional:      General: She is not in acute distress.    Appearance: She is well-developed.  HENT:     Head: Normocephalic and atraumatic.   Eyes:     Conjunctiva/sclera: Conjunctivae normal.     Pupils: Pupils are equal, round, and reactive to light.   Neck:     Thyroid : No  thyromegaly.   Cardiovascular:     Rate and Rhythm: Normal rate and regular rhythm.     Heart sounds: Normal heart sounds. No murmur heard. Pulmonary:     Effort: Pulmonary effort is normal. No respiratory distress.     Breath sounds: Normal breath sounds. No wheezing or rales.  Abdominal:     General: Bowel sounds are normal. There is no distension.     Palpations: Abdomen is soft.     Tenderness: There is no abdominal tenderness.   Musculoskeletal:        General: Normal range of motion.     Cervical back: Normal range of motion and neck supple.  Lymphadenopathy:     Cervical: No cervical adenopathy.   Skin:    General: Skin is warm and dry.   Neurological:     Mental Status: She is alert and oriented to person, place, and time.   Psychiatric:        Behavior: Behavior normal.        Thought Content: Thought content normal.  Judgment: Judgment normal.     {Perform Simple Foot Exam  Perform Detailed exam:1} Diabetic foot exam was performed with the following findings:   No deformities, ulcerations, or other skin breakdown Normal sensation of 10g monofilament Intact posterior tibialis and dorsalis pedis pulses Mild decrease in light touch bilaterally at the base of the 1st toe.      Assessment & Plan:  Type 2 diabetes mellitus without complication, without long-term current use of insulin  (HCC) -     Bayer DCA Hb A1c Waived -     CMP14+EGFR -     CBC with Differential/Platelet -     Lipid panel  Hyperlipidemia associated with type 2 diabetes mellitus (HCC) -     CMP14+EGFR -     CBC with Differential/Platelet -     Lipid panel  Hypertension associated with type 2 diabetes mellitus (HCC) -     CMP14+EGFR -     CBC with Differential/Platelet -     Lipid panel  Other orders -     hydroCHLOROthiazide ; Take 1 capsule (12.5 mg total) by mouth daily.  Dispense: 90 capsule; Refill: 1    Follow-up: Return in about 6 months (around 12/26/2023) for [employer]  physical.  Roise Cleaver, M.D.

## 2023-07-01 ENCOUNTER — Other Ambulatory Visit

## 2023-07-01 DIAGNOSIS — I152 Hypertension secondary to endocrine disorders: Secondary | ICD-10-CM | POA: Diagnosis not present

## 2023-07-01 DIAGNOSIS — E119 Type 2 diabetes mellitus without complications: Secondary | ICD-10-CM | POA: Diagnosis not present

## 2023-07-01 DIAGNOSIS — E785 Hyperlipidemia, unspecified: Secondary | ICD-10-CM | POA: Diagnosis not present

## 2023-07-01 DIAGNOSIS — E1159 Type 2 diabetes mellitus with other circulatory complications: Secondary | ICD-10-CM | POA: Diagnosis not present

## 2023-07-01 DIAGNOSIS — E1169 Type 2 diabetes mellitus with other specified complication: Secondary | ICD-10-CM | POA: Diagnosis not present

## 2023-07-01 LAB — LIPID PANEL

## 2023-07-02 LAB — CMP14+EGFR
ALT: 16 IU/L (ref 0–32)
AST: 20 IU/L (ref 0–40)
Albumin: 4.5 g/dL (ref 3.8–4.8)
Alkaline Phosphatase: 79 IU/L (ref 44–121)
BUN/Creatinine Ratio: 18 (ref 12–28)
BUN: 14 mg/dL (ref 8–27)
Bilirubin Total: 0.6 mg/dL (ref 0.0–1.2)
CO2: 24 mmol/L (ref 20–29)
Calcium: 10 mg/dL (ref 8.7–10.3)
Chloride: 100 mmol/L (ref 96–106)
Creatinine, Ser: 0.79 mg/dL (ref 0.57–1.00)
Globulin, Total: 2.4 g/dL (ref 1.5–4.5)
Glucose: 139 mg/dL — ABNORMAL HIGH (ref 70–99)
Potassium: 4.5 mmol/L (ref 3.5–5.2)
Sodium: 141 mmol/L (ref 134–144)
Total Protein: 6.9 g/dL (ref 6.0–8.5)
eGFR: 77 mL/min/{1.73_m2} (ref >59)

## 2023-07-02 LAB — CBC WITH DIFFERENTIAL/PLATELET
Basophils Absolute: 0 10*3/uL (ref 0.0–0.2)
Basos: 1 %
EOS (ABSOLUTE): 0.3 10*3/uL (ref 0.0–0.4)
Eos: 3 %
Hematocrit: 44.9 % (ref 34.0–46.6)
Hemoglobin: 15 g/dL (ref 11.1–15.9)
Immature Grans (Abs): 0 10*3/uL (ref 0.0–0.1)
Immature Granulocytes: 0 %
Lymphocytes Absolute: 2.2 10*3/uL (ref 0.7–3.1)
Lymphs: 27 %
MCH: 31.8 pg (ref 26.6–33.0)
MCHC: 33.4 g/dL (ref 31.5–35.7)
MCV: 95 fL (ref 79–97)
Monocytes Absolute: 0.7 10*3/uL (ref 0.1–0.9)
Monocytes: 9 %
Neutrophils Absolute: 4.9 10*3/uL (ref 1.4–7.0)
Neutrophils: 60 %
Platelets: 143 10*3/uL — ABNORMAL LOW (ref 150–450)
RBC: 4.72 x10E6/uL (ref 3.77–5.28)
RDW: 13.5 % (ref 11.7–15.4)
WBC: 8.1 10*3/uL (ref 3.4–10.8)

## 2023-07-02 LAB — LIPID PANEL
Cholesterol, Total: 160 mg/dL (ref 100–199)
HDL: 40 mg/dL (ref >39)
LDL CALC COMMENT:: 4 ratio (ref 0.0–4.4)
LDL Chol Calc (NIH): 80 mg/dL (ref 0–99)
Triglycerides: 243 mg/dL — ABNORMAL HIGH (ref 0–149)
VLDL Cholesterol Cal: 40 mg/dL (ref 5–40)

## 2023-07-03 NOTE — Progress Notes (Signed)
Hello Rita Payne,  Your lab result is normal and/or stable.Some minor variations that are not significant are commonly marked abnormal, but do not represent any medical problem for you.  Best regards, Claretta Fraise, M.D.

## 2023-07-04 ENCOUNTER — Ambulatory Visit (INDEPENDENT_AMBULATORY_CARE_PROVIDER_SITE_OTHER): Payer: Medicare Other

## 2023-07-04 ENCOUNTER — Telehealth: Payer: Self-pay

## 2023-07-04 VITALS — BP 127/65 | HR 66 | Ht 64.0 in | Wt 178.0 lb

## 2023-07-04 DIAGNOSIS — Z0000 Encounter for general adult medical examination without abnormal findings: Secondary | ICD-10-CM | POA: Diagnosis not present

## 2023-07-04 NOTE — Telephone Encounter (Signed)
 Referral # Creation Date Referral Status Status Update   [id number]      Rita Payne  Please give me an update on this referral. It's been over 1 week now and pt has not heard anything about it.   Thank you

## 2023-07-04 NOTE — Patient Instructions (Signed)
 Ms. Rita Payne , Thank you for taking time out of your busy schedule to complete your Annual Wellness Visit with me. I enjoyed our conversation and look forward to speaking with you again next year. I, as well as your care team,  appreciate your ongoing commitment to your health goals. Please review the following plan we discussed and let me know if I can assist you in the future. Your Game plan/ To Do List    Follow up Visits: Next Medicare AWV with our clinical staff: 07/06/24 at 1:10p.m.   Have you seen your provider in the last 6 months (3 months if uncontrolled diabetes)? Yes Next Office Visit with your provider: 10/01/23 at 7:55a.m.  Clinician Recommendations:  Aim for 30 minutes of exercise or brisk walking, 6-8 glasses of water , and 5 servings of fruits and vegetables each day.       This is a list of the screening recommended for you and due dates:  Health Maintenance  Topic Date Due   Eye exam for diabetics  05/06/2023   Medicare Annual Wellness Visit  07/03/2023   COVID-19 Vaccine (5 - 2024-25 season) 07/12/2023*   Zoster (Shingles) Vaccine (1 of 2) 09/26/2023*   DTaP/Tdap/Td vaccine (2 - Td or Tdap) 03/16/2024*   Flu Shot  08/08/2023   Yearly kidney health urinalysis for diabetes  10/07/2023   Mammogram  10/14/2023   Hemoglobin A1C  12/26/2023   DEXA scan (bone density measurement)  05/14/2024   Complete foot exam   06/25/2024   Yearly kidney function blood test for diabetes  06/30/2024   Pneumococcal Vaccine for age over 12  Completed   Hepatitis C Screening  Completed   Hepatitis B Vaccine  Aged Out   HPV Vaccine  Aged Out   Meningitis B Vaccine  Aged Out   Colon Cancer Screening  Discontinued   Cologuard (Stool DNA test)  Discontinued  *Topic was postponed. The date shown is not the original due date.    Advanced directives: (In Chart) A copy of your advanced directives are scanned into your chart should your provider ever need it. Advance Care Planning is important  because it:  [x]  Makes sure you receive the medical care that is consistent with your values, goals, and preferences  [x]  It provides guidance to your family and loved ones and reduces their decisional burden about whether or not they are making the right decisions based on your wishes.  Follow the link provided in your after visit summary or read over the paperwork we have mailed to you to help you started getting your Advance Directives in place. If you need assistance in completing these, please reach out to us  so that we can help you!  See attachments for Preventive Care and Fall Prevention Tips.

## 2023-07-04 NOTE — Progress Notes (Signed)
 Subjective:   Rita Payne is a 78 y.o. who presents for a Medicare Wellness preventive visit.  As a reminder, Annual Wellness Visits don't include a physical exam, and some assessments may be limited, especially if this visit is performed virtually. We may recommend an in-person follow-up visit with your provider if needed.  Visit Complete: Virtual I connected with  Chane Kluttz on 07/04/23 by a audio enabled telemedicine application and verified that I am speaking with the correct person using two identifiers.  Patient Location: Home  Provider Location: Home Office  I discussed the limitations of evaluation and management by telemedicine. The patient expressed understanding and agreed to proceed.  Vital Signs: Because this visit was a virtual/telehealth visit, some criteria may be missing or patient reported. Any vitals not documented were not able to be obtained and vitals that have been documented are patient reported.  VideoDeclined- This patient declined Librarian, academic. Therefore the visit was completed with audio only.  Persons Participating in Visit: Patient.  AWV Questionnaire: Yes: Patient Medicare AWV questionnaire was completed by the patient on 07/03/23; I have confirmed that all information answered by patient is correct and no changes since this date.        Objective:    There were no vitals filed for this visit. There is no height or weight on file to calculate BMI.     07/03/2022    3:37 PM 04/08/2022   12:11 PM 03/29/2022   10:32 AM 06/22/2021    2:50 PM 06/21/2020    2:35 PM 06/21/2019    2:54 PM 06/18/2018    3:32 PM  Advanced Directives  Does Patient Have a Medical Advance Directive? Yes Yes Yes Yes No Yes Yes  Type of Estate agent of [place];Living will Healthcare Power of [employer] of [place];Living will  Healthcare Power of [place];Living will Healthcare Power of [place];Living will   Does patient want to make changes to medical advance directive? No - Patient declined No - Patient declined No - Patient declined   No - Patient declined No - Patient declined   Copy of Healthcare Power of Attorney in Chart? Yes - validated most recent copy scanned in chart (See row information) Yes - validated most recent copy scanned in chart (See row information)  No - copy requested  No - copy requested No - copy requested   Would patient like information on creating a medical advance directive?     No - Patient declined       Data saved with a previous flowsheet row definition    Current Medications (verified) Outpatient Encounter Medications as of 07/04/2023  Medication Sig   albuterol  (VENTOLIN  HFA) 108 (90 Base) MCG/ACT inhaler USE 2 INHALATIONS EVERY 6 HOURS AS NEEDED FOR WHEEZING OR SHORTNESS OF BREATH, NEED FOR CHRONIC ASTHMA   atorvastatin  (LIPITOR ) 80 MG tablet Take 1 tablet (80 mg total) by mouth daily.   celecoxib  (CELEBREX ) 200 MG capsule TAKE 1 CAPSULE EVERY 12 HOURS   cetirizine  (ZYRTEC ) 10 MG tablet Take 1 tablet (10 mg total) by mouth daily.   cholecalciferol  (VITAMIN D ) 1000 UNITS tablet Take 1,000 Units by mouth daily.   cloNIDine  (CATAPRES ) 0.1 MG tablet Take 1 tablet (0.1 mg total) by mouth 2 (two) times daily.   Dulaglutide  (TRULICITY ) 1.5 MG/0.5ML SOAJ INJECT THE CONTENTS OF 1 PEN UNDER THE SKIN WEEKLY   Estradiol  (YUVAFEM ) 10 MCG TABS vaginal tablet INSERT 1 TABLET VAGINALLY TWICE A WEEK  fluticasone  (FLONASE ) 50 MCG/ACT nasal spray Place 2 sprays into both nostrils daily.   Fluticasone -Salmeterol (ADVAIR DISKUS) 250-50 MCG/DOSE AEPB USE 1 INHALATION EVERY 12 HOURS   glucose blood (FREESTYLE LITE) test strip Use as instructed   hydrochlorothiazide  (MICROZIDE ) 12.5 MG capsule Take 1 capsule (12.5 mg total) by mouth daily.   ibandronate  (BONIVA ) 150 MG tablet Take 1 tablet (150 mg total) by mouth every 30 (thirty) days. Take in the morning with a full glass of water ,  on an empty stomach, and do not take anything else by mouth or lie down for the next 30 min.   MAGNESIUM  PO Take 420 mg by mouth daily.   omeprazole  (PRILOSEC) 40 MG capsule Take 1 capsule (40 mg total) by mouth daily.   Facility-Administered Encounter Medications as of 07/04/2023  Medication   magnesium  citrate solution 1 Bottle   sodium phosphate  (FLEET) 7-19 GM/118ML enema 1 enema    Allergies (verified) Ivp dye [iodinated contrast media], Shellfish allergy, Vasotec [enalapril], Adhesive [tape], Amlodipine , Gabapentin , Iodine , and Pyridium [phenazopyridine hcl]   History: Past Medical History:  Diagnosis Date   Allergy    Arthritis    Basal cell carcinoma    Cataract    Chronic kidney disease    left kidney tumor removed, benign   Diarrhea    First degree heart block    GERD (gastroesophageal reflux disease)    Hyperlipidemia    Hypertension    Leg fracture    Melanoma (HCC)    Mild intermittent asthma    Neuromuscular disorder (HCC)    neuropathy feet - mild   Pneumonia    SUI (stress urinary incontinence, female)    Type 2 diabetes mellitus (HCC)    Past Surgical History:  Procedure Laterality Date   ABDOMINAL HYSTERECTOMY  1987   BELPHAROPTOSIS REPAIR Bilateral 2009   9    CHOLECYSTECTOMY  1989   COLONOSCOPY     HERNIA REPAIR     incisional hernia from cholecystectomy   kidney tumor removal   1986   KNEE ARTHROSCOPY Bilateral    LUMBAR LAMINECTOMY/DECOMPRESSION MICRODISCECTOMY N/A 12/08/2013   Procedure: LUMBAR DECOMPRESSION L4-L5,  L3-L4 ;  Surgeon: Reyes JAYSON Billing, MD;  Location: [facility];  Service: Orthopedics;  Laterality: N/A;   POLYPECTOMY     PUBOVAGINAL SLING N/A 10/03/2014   Procedure: CARLOYN GLADE;  Surgeon: Arlena Gal, MD;  Location: [facility];  Service: Urology;  Laterality: N/A;   REPLACEMENT TOTAL HIP W/  RESURFACING IMPLANTS Right    TONSILLECTOMY     TOTAL HIP ARTHROPLASTY Right 08/25/2012   Procedure: RIGHT  TOTAL HIP ARTHROPLASTY ANTERIOR APPROACH;  Surgeon: Donnice JONETTA Car, MD;  Location: [facility];  Service: Orthopedics;  Laterality: Right;   TOTAL KNEE ARTHROPLASTY Left 04/08/2022   Procedure: TOTAL KNEE ARTHROPLASTY;  Surgeon: Melodi Lerner, MD;  Location: [facility];  Service: Orthopedics;  Laterality: Left;   Family History  Problem Relation Age of Onset   Diabetes Mother    Heart disease Mother    Stroke Mother    Heart attack Mother    Heart disease Father    Stroke Father    Diabetes Sister    Heart disease Sister    Heart failure Sister    Diabetes Sister    Stroke Sister    Colon polyps Sister    Arthritis/Rheumatoid Sister    Colon polyps Sister    Vision loss Brother    Arthritis/Rheumatoid Brother    Heart disease Brother  Heart failure Brother    Heart attack Brother    Arrhythmia Brother    Colon cancer Neg Hx    Esophageal cancer Neg Hx    Stomach cancer Neg Hx    Rectal cancer Neg Hx    Breast cancer Neg Hx    Social History   Socioeconomic History   Marital status: Widowed    Spouse name: Not on file   Number of children: 2   Years of education: some college   Highest education level: Some college, no degree  Occupational History   Occupation: IT consultant     Comment: 3 days per wk/ [employer]; 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 Son in [place] , daughter in [place]. 2 sisters live nearby   Social Drivers of  Health   Financial Resource Strain: Low Risk  (06/22/2023)   Overall Financial Resource Strain (CARDIA)    Difficulty of Paying Living Expenses: Not very hard  Food Insecurity: No Food Insecurity (06/22/2023)   Hunger Vital Sign    Worried About Running Out of Food in the Last Year: Never true    Ran Out of Food in the Last Year: Never true  Transportation Needs: No Transportation Needs (06/22/2023)   PRAPARE - Administrator, Civil Service (Medical): No    Lack of Transportation (Non-Medical): No  Physical Activity: Insufficiently Active (06/22/2023)   Exercise Vital Sign    Days of Exercise per Week: 3 days    Minutes of Exercise per Session: 30 min  Stress: No Stress Concern Present (06/22/2023)   [employer] of Occupational Health - Occupational Stress Questionnaire    Feeling of Stress: Not at all  Social Connections: Moderately Isolated (06/22/2023)   Social Connection and Isolation Panel    Frequency of Communication with Friends and Family: More than three times a week    Frequency of Social Gatherings with Friends and Family: Once a week    Attends Religious Services: 1 to 4 times per year    Active Member of [employer] or Organizations: No    Attends Banker Meetings: Not on file    Marital Status: Widowed    Tobacco Counseling Counseling given: Not Answered    Clinical Intake:              Lab Results  Component Value Date   HGBA1C 6.5 (H) 06/26/2023   HGBA1C 6.1 (H) 03/17/2023   HGBA1C 5.8 (H) 10/07/2022               Activities of Daily Living     07/03/2023    8:42 PM  In your present state of health, do you have any difficulty performing the following activities:  Hearing? 0  Vision? 0  Difficulty concentrating or making decisions? 0  Walking or climbing stairs? 0  Dressing  or bathing? 0  Doing errands, shopping? 0  Preparing Food and eating ? N  Using the Toilet? N  In the past six months, have you accidently  leaked urine? N  Do you have problems with loss of bowel control? N  Managing your Medications? N  Managing your Finances? N  Housekeeping or managing your Housekeeping? N    Patient Care Team: Zollie Lowers, MD as PCP - General (Family Medicine) Eda Baxter, MD as Referring Physician (Dermatology) Ernie Cough, MD as Consulting Physician (Orthopedic Surgery)  I have updated your Care Teams any recent Medical Services you may have received from other providers in the past year.     Assessment:   This is a routine wellness examination for Rita Payne.  Hearing/Vision screen No results found.   Goals Addressed   None    Depression Screen     06/26/2023    8:16 AM 03/17/2023    8:02 AM 10/07/2022    8:10 AM 07/24/2022    3:18 PM 07/03/2022    3:36 PM 07/03/2022    8:27 AM 05/15/2022    2:15 PM  PHQ 2/9 Scores  PHQ - 2 Score 0 0 0 0 0 0 0  PHQ- 9 Score 0 0         Fall Risk     07/03/2023    8:42 PM 06/26/2023    8:16 AM 10/07/2022    8:10 AM 07/24/2022    3:18 PM 07/03/2022    3:34 PM  Fall Risk   Falls in the past year? 0 0 0 1 1  Number falls in past yr:  0  0 1  Injury with Fall?  0  1 1  Risk for fall due to :  No Fall Risks  History of fall(s);Impaired balance/gait;Orthopedic patient History of fall(s);Impaired balance/gait;Orthopedic patient  Follow up  Falls evaluation completed  Falls evaluation completed Education provided;Falls prevention discussed;Falls evaluation completed    MEDICARE RISK AT HOME:  Medicare Risk at Home Any stairs in or around the home?: (Patient-Rptd) Yes If so, are there any without handrails?: (Patient-Rptd) Yes Home free of loose throw rugs in walkways, pet beds, electrical cords, etc?: (Patient-Rptd) Yes Adequate lighting in your home to reduce risk of falls?: (Patient-Rptd) Yes Life alert?: (Patient-Rptd) No Use of a cane, walker or w/c?: (Patient-Rptd) No Grab bars in the bathroom?: (Patient-Rptd) No Shower chair or bench in  shower?: (Patient-Rptd) Yes Elevated toilet seat or a handicapped toilet?: (Patient-Rptd) Yes  TIMED UP AND GO:  Was the test performed?  no  Cognitive Function: 6CIT completed    06/11/2017   10:41 AM 06/05/2016    9:19 AM  MMSE - Mini Mental State Exam  Orientation to time 5 5   Orientation to Place 5 5   Registration 3 3   Attention/ Calculation 5 5   Recall 3 3   Language- name 2 objects 2 2   Language- repeat 1 1  Language- follow 3 step command 3 3   Language- read & follow direction 1 1   Write a sentence 1 1   Copy design 1 1   Total score 30 30      Data saved with a previous flowsheet row definition        07/03/2022    3:38 PM 06/22/2021    2:51 PM 06/21/2020    2:38 PM 06/21/2019    2:57 PM 06/18/2018    3:29 PM  6CIT Screen  What Year?  0 points 0 points 0 points 0 points 0 points  What month? 0 points 0 points 0 points 0 points 0 points  What time? 0 points 0 points 0 points 0 points 0 points  Count back from 20 0 points 0 points 0 points 0 points 0 points  Months in reverse 0 points 0 points 0 points 0 points 0 points  Repeat phrase 0 points 0 points 0 points 2 points 2 points  Total Score 0 points 0 points 0 points 2 points 2 points    Immunizations Immunization History  Administered Date(s) Administered   Fluad Quad(high Dose 65+) 11/30/2018, 12/07/2019, 12/19/2020, 12/06/2021   Fluad Trivalent(High Dose 65+) 10/07/2022   Influenza, High Dose Seasonal PF 11/08/2016, 10/10/2017   Influenza,inj,Quad PF,6+ Mos 10/21/2012, 11/11/2013, 12/23/2014, 10/05/2015   Influenza,inj,quad, With Preservative 10/07/2016   Moderna SARS-COV2 Booster Vaccination 04/25/2020   Moderna Sars-Covid-2 Vaccination 02/18/2019, 03/19/2019, 11/16/2019   Pneumococcal Conjugate-13 06/01/2014   Pneumococcal Polysaccharide-23 07/14/2012   Pneumococcal-Unspecified 01/08/2015   RSV,unspecified 01/11/2022   Tdap 07/15/2007    Screening Tests Health Maintenance  Topic Date Due    OPHTHALMOLOGY EXAM  05/06/2023   Medicare Annual Wellness (AWV)  07/03/2023   COVID-19 Vaccine (5 - 2024-25 season) 07/12/2023 (Originally 09/08/2022)   Zoster Vaccines- Shingrix (1 of 2) 09/26/2023 (Originally 04/29/1997)   DTaP/Tdap/Td (2 - Td or Tdap) 03/16/2024 (Originally 07/14/2017)   INFLUENZA VACCINE  08/08/2023   Diabetic kidney evaluation - Urine ACR  10/07/2023   MAMMOGRAM  10/14/2023   HEMOGLOBIN A1C  12/26/2023   DEXA SCAN  05/14/2024   FOOT EXAM  06/25/2024   Diabetic kidney evaluation - eGFR measurement  06/30/2024   Pneumococcal Vaccine: 50+ Years  Completed   Hepatitis C Screening  Completed   Hepatitis B Vaccines  Aged Out   HPV VACCINES  Aged Out   Meningococcal B Vaccine  Aged Out   Colonoscopy  Discontinued   Fecal DNA (Cologuard)  Discontinued    Health Maintenance  Health Maintenance Due  Topic Date Due   OPHTHALMOLOGY EXAM  05/06/2023   Medicare Annual Wellness (AWV)  07/03/2023   Health Maintenance Items Addressed: See Nurse Notes at the end of this note  Additional Screening:  Vision Screening: Recommended annual ophthalmology exams for early detection of glaucoma and other disorders of the eye. Would you like a referral to an eye doctor? No    Dental Screening: Recommended annual dental exams for proper oral hygiene  Community Resource Referral / Chronic Care Management: CRR required this visit?  No   CCM required this visit?  No   Plan:    I have personally reviewed and noted the following in the patient's chart:   Medical and social history Use of alcohol, tobacco or illicit drugs  Current medications and supplements including opioid prescriptions. Patient is not currently taking opioid prescriptions. Functional ability and status Nutritional status Physical activity Advanced directives List of other physicians Hospitalizations, surgeries, and ER visits in previous 12 months Vitals Screenings to include cognitive, depression, and  falls Referrals and appointments  In addition, I have reviewed and discussed with patient certain preventive protocols, quality metrics, and best practice recommendations. A written personalized care plan for preventive services as well as general preventive health recommendations were provided to patient.   Ozie Ned, CMA   07/04/2023   After Visit Summary: (MyChart) Due to this being a telephonic visit, the after visit summary with patients personalized plan was offered to patient via MyChart  Notes: Nothing significant to report at this time.

## 2023-07-08 NOTE — Telephone Encounter (Signed)
 Spoke to patient and informed that the referral status indicates ready for initial scheduling. I gave her the contact information.

## 2023-07-08 NOTE — Telephone Encounter (Signed)
 Referral sent to: [facility] [address] [place], [place] [address] [phone]  Once the Referral is reviewed by the Office they will reach out to Patient for Appt Scheduling.

## 2023-07-15 ENCOUNTER — Other Ambulatory Visit: Payer: Self-pay | Admitting: Family Medicine

## 2023-07-15 DIAGNOSIS — I10 Essential (primary) hypertension: Secondary | ICD-10-CM

## 2023-07-15 DIAGNOSIS — Z780 Asymptomatic menopausal state: Secondary | ICD-10-CM

## 2023-07-23 DIAGNOSIS — L570 Actinic keratosis: Secondary | ICD-10-CM | POA: Diagnosis not present

## 2023-07-23 DIAGNOSIS — L821 Other seborrheic keratosis: Secondary | ICD-10-CM | POA: Diagnosis not present

## 2023-07-23 DIAGNOSIS — L920 Granuloma annulare: Secondary | ICD-10-CM | POA: Diagnosis not present

## 2023-07-23 DIAGNOSIS — Z85828 Personal history of other malignant neoplasm of skin: Secondary | ICD-10-CM | POA: Diagnosis not present

## 2023-08-11 ENCOUNTER — Telehealth: Payer: Self-pay | Admitting: Adult Health

## 2023-08-11 NOTE — Telephone Encounter (Signed)
 LVM and sent mychart msg informing pt of cancellation of 8/5 VV due to not having enough information from CPAP  If patient calls back please reschedule her around 09/25/23 with NP

## 2023-08-12 ENCOUNTER — Telehealth: Admitting: Adult Health

## 2023-08-18 ENCOUNTER — Other Ambulatory Visit: Payer: Self-pay | Admitting: Family Medicine

## 2023-08-25 NOTE — Telephone Encounter (Unsigned)
 Copied from CRM [id number]. Topic: Clinical - Medication Refill >> Aug 25, 2023 12:21 PM Roselie C wrote: Medication: cloNIDine  (CATAPRES ) 0.1 MG tablet,   Has the patient contacted their pharmacy? Yes (Agent: If no, request that the patient contact the pharmacy for the refill. If patient does not wish to contact the pharmacy document the reason why and proceed with request.) (Agent: If yes, when and what did the pharmacy advise?)   [facility] - [place], [place] - [address] [place] [address] Phone: [phone] Fax: [phone]  Is this the correct pharmacy for this prescription? Yes If no, delete pharmacy and type the correct one.   Has the prescription been filled recently? Yes  Is the patient out of the medication? No  Has the patient been seen for an appointment in the last year OR does the patient have an upcoming appointment? Yes  Can we respond through MyChart? Yes  Agent: Please be advised that Rx refills may take up to 3 business days. We ask that you follow-up with your pharmacy.

## 2023-09-09 ENCOUNTER — Other Ambulatory Visit: Payer: Self-pay | Admitting: Family Medicine

## 2023-09-09 DIAGNOSIS — Z1231 Encounter for screening mammogram for malignant neoplasm of breast: Secondary | ICD-10-CM

## 2023-10-01 ENCOUNTER — Ambulatory Visit (INDEPENDENT_AMBULATORY_CARE_PROVIDER_SITE_OTHER): Admitting: Family Medicine

## 2023-10-01 ENCOUNTER — Encounter: Payer: Self-pay | Admitting: Family Medicine

## 2023-10-01 VITALS — BP 184/76 | HR 75 | Temp 98.3°F | Ht 64.0 in | Wt 181.0 lb

## 2023-10-01 DIAGNOSIS — E1169 Type 2 diabetes mellitus with other specified complication: Secondary | ICD-10-CM

## 2023-10-01 DIAGNOSIS — I10 Essential (primary) hypertension: Secondary | ICD-10-CM | POA: Diagnosis not present

## 2023-10-01 DIAGNOSIS — M1712 Unilateral primary osteoarthritis, left knee: Secondary | ICD-10-CM | POA: Diagnosis not present

## 2023-10-01 DIAGNOSIS — E785 Hyperlipidemia, unspecified: Secondary | ICD-10-CM

## 2023-10-01 DIAGNOSIS — K219 Gastro-esophageal reflux disease without esophagitis: Secondary | ICD-10-CM

## 2023-10-01 DIAGNOSIS — Z0000 Encounter for general adult medical examination without abnormal findings: Secondary | ICD-10-CM

## 2023-10-01 DIAGNOSIS — I152 Hypertension secondary to endocrine disorders: Secondary | ICD-10-CM

## 2023-10-01 DIAGNOSIS — E1159 Type 2 diabetes mellitus with other circulatory complications: Secondary | ICD-10-CM | POA: Diagnosis not present

## 2023-10-01 DIAGNOSIS — J4520 Mild intermittent asthma, uncomplicated: Secondary | ICD-10-CM | POA: Diagnosis not present

## 2023-10-01 DIAGNOSIS — Z780 Asymptomatic menopausal state: Secondary | ICD-10-CM | POA: Diagnosis not present

## 2023-10-01 DIAGNOSIS — E119 Type 2 diabetes mellitus without complications: Secondary | ICD-10-CM | POA: Diagnosis not present

## 2023-10-01 LAB — LIPID PANEL

## 2023-10-01 LAB — BAYER DCA HB A1C WAIVED: HB A1C (BAYER DCA - WAIVED): 6.1 % — ABNORMAL HIGH (ref 4.8–5.6)

## 2023-10-01 MED ORDER — IBANDRONATE SODIUM 150 MG PO TABS: 150.0000 mg | ORAL_TABLET | ORAL | 3 refills | Status: AC

## 2023-10-01 MED ORDER — TRULICITY 1.5 MG/0.5ML ~~LOC~~ SOAJ: SUBCUTANEOUS | 3 refills | Status: AC

## 2023-10-01 MED ORDER — OMEPRAZOLE 40 MG PO CPDR: 40.0000 mg | DELAYED_RELEASE_CAPSULE | Freq: Every day | ORAL | 3 refills | Status: AC

## 2023-10-01 MED ORDER — HYDROCHLOROTHIAZIDE 12.5 MG PO CAPS: 12.5000 mg | ORAL_CAPSULE | Freq: Every day | ORAL | 3 refills | Status: AC

## 2023-10-01 MED ORDER — CETIRIZINE HCL 10 MG PO TABS: 10.0000 mg | ORAL_TABLET | Freq: Every day | ORAL | 1 refills | Status: AC

## 2023-10-01 MED ORDER — ALBUTEROL SULFATE HFA 108 (90 BASE) MCG/ACT IN AERS: 2.0000 | INHALATION_SPRAY | RESPIRATORY_TRACT | 11 refills | Status: AC | PRN

## 2023-10-01 MED ORDER — FREESTYLE LITE TEST VI STRP: ORAL_STRIP | 3 refills | Status: AC

## 2023-10-01 MED ORDER — ATORVASTATIN CALCIUM 80 MG PO TABS: 80.0000 mg | ORAL_TABLET | Freq: Every day | ORAL | 3 refills | Status: AC

## 2023-10-01 MED ORDER — FLUTICASONE PROPIONATE 50 MCG/ACT NA SUSP: 2.0000 | Freq: Every day | NASAL | 3 refills | Status: AC

## 2023-10-01 MED ORDER — CELECOXIB 200 MG PO CAPS: ORAL_CAPSULE | ORAL | 3 refills | Status: AC

## 2023-10-01 MED ORDER — CLONIDINE HCL 0.1 MG PO TABS: 0.1000 mg | ORAL_TABLET | Freq: Two times a day (BID) | ORAL | 3 refills | Status: AC

## 2023-10-01 MED ORDER — ESTRADIOL 10 MCG VA TABS: ORAL_TABLET | VAGINAL | 3 refills | Status: AC

## 2023-10-01 NOTE — Progress Notes (Unsigned)
 Subjective:  Patient ID: Rita Payne, female    DOB: 11-Jul-1945  Age: 78 y.o. MRN: [id number]  CC: Annual Exam   HPI  Discussed the use of AI scribe software for clinical note transcription with the patient, who gave verbal consent to proceed.  History of Present Illness Rita Payne is a 78 year old female who presents for a physical examination.  She experiences fatigue and shortness of breath upon exertion. Climbing three flights of stairs results in a sensation of weight on her chest, difficulty breathing, and sweating, requiring her to rest for about twenty minutes before resuming activities.  Her diabetes is managed with non-insulin  injections. She reports occasional high blood sugar levels, with a recent morning reading of 160 mg/dL. She experiences polyuria and generally does not eat until 11 AM. She works as a paralegal at two different jobs, one during the day and one at night, affecting her eating schedule. She notes weight gain and is concerned about her blood sugar levels, although most readings are between 120 and 140 mg/dL.  She experiences arthritis-related pain, particularly at the base of her thumbs, which worsens with weather changes. The pain improves with activity. She also reports swelling in her left leg, more pronounced than the right, especially after a knee replacement surgery. Her knees ache with weather changes but are otherwise manageable. She uses Celebrex  for arthritis and reports needing a refill.  She has not used Advair for three to four months as she does not notice a difference with its use. She uses albuterol  more frequently in the fall and spring, about two to three times a week, and finds it sufficient. She also uses a nasal spray to help with breathing at night.  She takes a stool softener daily, sometimes twice, but does not use magnesium  citrate or sodium phosphate  enemas. She uses a CPAP machine for sleep apnea, which helps her feel more  rested. She also uses Vagifem  and a new supplement called Blink for dry eyes.  She has a history of skin cancer, with a melanoma removed from her stomach and basal cell carcinoma from behind her ear, requiring biannual dermatology visits.          10/01/2023    8:01 AM 07/04/2023   11:29 AM 06/26/2023    8:16 AM  Depression screen PHQ 2/9  Decreased Interest 0 0 0  Down, Depressed, Hopeless 0 0 0  PHQ - 2 Score 0 0 0  Altered sleeping  0 0  Tired, decreased energy  0 0  Change in appetite  0 0  Feeling bad or failure about yourself   0 0  Trouble concentrating  0 0  Moving slowly or fidgety/restless  0 0  Suicidal thoughts  0 0  PHQ-9 Score  0 0  Difficult doing work/chores  Not difficult at all Not difficult at all    History Rita Payne has a past medical history of Allergy, Arthritis, Basal cell carcinoma, Cataract, Chronic kidney disease, Diarrhea, First degree heart block, GERD (gastroesophageal reflux disease), Hyperlipidemia, Hypertension, Leg fracture, Melanoma (HCC), Mild intermittent asthma, Neuromuscular disorder (HCC), Pneumonia, SUI (stress urinary incontinence, female), and Type 2 diabetes mellitus (HCC).   She has a past surgical history that includes Tonsillectomy; Abdominal hysterectomy (1987); Cholecystectomy (1989); Knee arthroscopy (Bilateral); Hernia repair; kidney tumor removal  (1986); Blepharoptosis repair (Bilateral, 2009); Total hip arthroplasty (Right, 08/25/2012); Lumbar laminectomy/decompression microdiscectomy (N/A, 12/08/2013); Pubovaginal sling (N/A, 10/03/2014); Replacement total hip w/  resurfacing implants (Right); Colonoscopy; Polypectomy;  Total knee arthroplasty (Left, 04/08/2022); and Joint replacement.   Her family history includes Alcohol abuse in her brother and father; Arrhythmia in her brother; Arthritis in her brother, brother, father, mother, sister, and sister; Arthritis/Rheumatoid in her brother and sister; Asthma in her sister; Cancer in her  sister; Colon polyps in her sister and sister; Depression in her brother, sister, and sister; Diabetes in her mother, paternal grandmother, sister, and sister; Drug abuse in her brother; Early death in her brother, brother, and sister; Heart attack in her brother and mother; Heart disease in her brother, brother, father, mother, and sister; Heart failure in her brother and sister; Hyperlipidemia in her sister; Hypertension in her brother, brother, father, mother, sister, sister, and sister; Kidney disease in her sister and sister; Miscarriages / Stillbirths in her daughter, mother, and sister; Obesity in her sister and sister; Stroke in her father, mother, and sister; Varicose Veins in her daughter; Vision loss in her brother.She reports that she quit smoking about 18 months ago. Her smoking use included cigarettes. She started smoking about 34 years ago. She has a 19.3 pack-year smoking history. She has never used smokeless tobacco. She reports that she does not drink alcohol and does not use drugs.    ROS Review of Systems  Constitutional:  Negative for appetite change, chills, diaphoresis, fatigue, fever and unexpected weight change.  HENT:  Negative for congestion, ear pain, hearing loss, postnasal drip, rhinorrhea, sneezing, sore throat and trouble swallowing.   Eyes:  Negative for pain.  Respiratory:  Positive for shortness of breath (with exertion). Negative for cough and chest tightness.   Cardiovascular:  Negative for chest pain and palpitations.  Gastrointestinal:  Negative for abdominal pain, constipation, diarrhea, nausea and vomiting.  Endocrine: Negative for cold intolerance, heat intolerance, polydipsia, polyphagia and polyuria.  Genitourinary:  Negative for dysuria, frequency and menstrual problem.  Musculoskeletal:  Negative for arthralgias and joint swelling.  Skin:  Negative for rash.  Allergic/Immunologic: Negative for environmental allergies.  Neurological:  Negative for  dizziness, weakness, numbness and headaches.  Psychiatric/Behavioral:  Negative for agitation and dysphoric mood.     Objective:  BP (!) 184/76   Pulse 75   Temp 98.3 F (36.8 C)   Ht 5' 4 (1.626 m)   Wt 181 lb (82.1 kg)   LMP 04/14/1982   SpO2 95%   BMI 31.07 kg/m   BP Readings from Last 3 Encounters:  10/02/23 138/78  10/01/23 (!) 184/76  07/04/23 127/65    Wt Readings from Last 3 Encounters:  10/02/23 182 lb 12.8 oz (82.9 kg)  10/01/23 181 lb (82.1 kg)  07/04/23 178 lb (80.7 kg)     Physical Exam Musculoskeletal:     Right lower leg: Edema present.     Left lower leg: Edema present.     Physical Exam GENERAL: Alert, cooperative, well developed, no acute distress. HEENT: Normocephalic, normal oropharynx, moist mucous membranes. CHEST: Clear to auscultation bilaterally, no wheezes, rhonchi, or crackles. CARDIOVASCULAR: Normal heart rate and rhythm, S1 and S2 normal without murmurs. No carotid bruit bilaterally. ABDOMEN: Soft, non-tender, non-distended, without organomegaly, normal bowel sounds. NEUROLOGICAL: Cranial nerves grossly intact, extraocular movements intact, moves all extremities without gross motor or sensory deficit. SKIN: Multiple harmless moles observed, including under breast.   Assessment & Plan:  Well adult exam  Type 2 diabetes mellitus without complication, without long-term current use of insulin  (HCC) -     CMP14+EGFR -     Bayer DCA Hb A1c Waived -  Microalbumin / creatinine urine ratio -     FreeStyle Lite Test; Use as instructed  Dispense: 200 strip; Refill: 3  Essential hypertension -     CBC with Differential/Platelet -     cloNIDine  HCl; Take 1 tablet (0.1 mg total) by mouth 2 (two) times daily.  Dispense: 180 tablet; Refill: 3  Hyperlipidemia associated with type 2 diabetes mellitus (HCC) -     Lipid panel  Primary osteoarthritis of left knee  Hypertension associated with type 2 diabetes mellitus (HCC)  Mild  intermittent chronic asthma without complication -     Albuterol  Sulfate HFA; Inhale 2 puffs into the lungs every 4 (four) hours as needed for wheezing or shortness of breath. FOR CHRONIC ASTHMA  Dispense: 18 g; Refill: 11  Post-menopausal -     Estradiol ; INSERT 1 TABLET VAGINALLY TWICE A WEEK  Dispense: 26 tablet; Refill: 3  Gastroesophageal reflux disease without esophagitis -     Omeprazole ; Take 1 capsule (40 mg total) by mouth daily.  Dispense: 90 capsule; Refill: 3  Other orders -     Atorvastatin  Calcium ; Take 1 tablet (80 mg total) by mouth daily.  Dispense: 90 tablet; Refill: 3 -     Celecoxib ; TAKE 1 CAPSULE EVERY 12 HOURS  Dispense: 180 capsule; Refill: 3 -     Cetirizine  HCl; Take 1 tablet (10 mg total) by mouth daily.  Dispense: 90 tablet; Refill: 1 -     Trulicity ; INJECT THE CONTENTS OF 1 PEN UNDER THE SKIN WEEKLY  Dispense: 6 mL; Refill: 3 -     Fluticasone  Propionate; Place 2 sprays into both nostrils daily.  Dispense: 54.6 mL; Refill: 3 -     hydroCHLOROthiazide ; Take 1 capsule (12.5 mg total) by mouth daily.  Dispense: 90 capsule; Refill: 3 -     Ibandronate  Sodium; Take 1 tablet (150 mg total) by mouth every 30 (thirty) days. Take in the morning with a full glass of water , on an empty stomach, and do not take anything else by mouth or lie down for the next 30 min.  Dispense: 3 tablet; Refill: 3    Assessment and Plan Assessment & Plan Shortness of breath and exertional fatigue   She experiences fatigue and shortness of breath with exertion, recently having significant dyspnea and chest pressure after climbing stairs. Scheduled for cardiology evaluation. She should report the recent episode to the cardiologist.  Edema of left lower extremity   Edema is present in the left lower extremity, more pronounced than the right, especially post-knee replacement. Evaluated for potential cardiac involvement due to concurrent shortness of breath.  Type 2 diabetes mellitus   Type  2 diabetes is managed with non-insulin  injections. She reports occasional elevated fasting blood sugars, with a recent high of 160 mg/dL. Most fasting sugars range from 120 to 140 mg/dL. Advise monitoring blood sugar more frequently, especially 2 hours postprandial. Consider setting an alarm to remind her to check blood sugar.  Osteoarthritis of hands and knees   Osteoarthritis causes pain and stiffness in the hands, particularly at the base of the thumbs, exacerbated by weather changes. Knees ache with weather changes or prolonged activity. Renew prescription for Celebrex .  Chronic lower back pain   Chronic lower back pain persists with a history of back surgery. Pain is exacerbated by certain movements, such as bending.  Asthma and chronic allergic rhinitis   Asthma and allergic rhinitis are managed with albuterol  and nasal spray. Albuterol  is used 2-3 times a week, primarily  in fall and spring. Advair is discontinued as it no longer seems effective. Continue albuterol  and nasal spray. Consider alternative medication if albuterol  use increases.  Obstructive sleep apnea   Obstructive sleep apnea is managed with CPAP. She reports feeling more rested with CPAP use despite discomfort. Continue CPAP use.  Chronic constipation   Chronic constipation is managed with stool softeners. Continue stool softeners as needed.  Melanoma and basal cell carcinoma   She has a history of melanoma and basal cell carcinoma, with regular dermatological follow-ups every six months. Continue dermatology follow-ups every six months.  Adult Wellness Visit   During the routine adult wellness visit for a 78 year old female, the optional nature of Pap smears post-hysterectomy and the importance of breast exams in conjunction with mammograms were discussed. Perform breast exam and schedule mammogram for next month.       Follow-up: No follow-ups on file.  Butler Der, M.D.

## 2023-10-02 ENCOUNTER — Ambulatory Visit: Attending: Internal Medicine

## 2023-10-02 ENCOUNTER — Telehealth: Payer: Self-pay | Admitting: Internal Medicine

## 2023-10-02 ENCOUNTER — Encounter: Payer: Self-pay | Admitting: Internal Medicine

## 2023-10-02 ENCOUNTER — Ambulatory Visit: Attending: Internal Medicine | Admitting: Internal Medicine

## 2023-10-02 VITALS — BP 138/78 | HR 83 | Ht 64.0 in | Wt 182.8 lb

## 2023-10-02 DIAGNOSIS — I499 Cardiac arrhythmia, unspecified: Secondary | ICD-10-CM | POA: Diagnosis not present

## 2023-10-02 DIAGNOSIS — I491 Atrial premature depolarization: Secondary | ICD-10-CM | POA: Insufficient documentation

## 2023-10-02 DIAGNOSIS — R0609 Other forms of dyspnea: Secondary | ICD-10-CM | POA: Insufficient documentation

## 2023-10-02 DIAGNOSIS — R079 Chest pain, unspecified: Secondary | ICD-10-CM | POA: Diagnosis not present

## 2023-10-02 DIAGNOSIS — R42 Dizziness and giddiness: Secondary | ICD-10-CM | POA: Diagnosis not present

## 2023-10-02 LAB — CBC WITH DIFFERENTIAL/PLATELET
Basophils Absolute: 0 x10E3/uL (ref 0.0–0.2)
Basos: 1 %
EOS (ABSOLUTE): 0.2 x10E3/uL (ref 0.0–0.4)
Eos: 3 %
Hematocrit: 45.6 % (ref 34.0–46.6)
Hemoglobin: 15.3 g/dL (ref 11.1–15.9)
Immature Grans (Abs): 0 x10E3/uL (ref 0.0–0.1)
Immature Granulocytes: 0 %
Lymphocytes Absolute: 1.8 x10E3/uL (ref 0.7–3.1)
Lymphs: 24 %
MCH: 31.7 pg (ref 26.6–33.0)
MCHC: 33.6 g/dL (ref 31.5–35.7)
MCV: 94 fL (ref 79–97)
Monocytes Absolute: 0.8 x10E3/uL (ref 0.1–0.9)
Monocytes: 10 %
Neutrophils Absolute: 4.7 x10E3/uL (ref 1.4–7.0)
Neutrophils: 62 %
Platelets: 140 x10E3/uL — ABNORMAL LOW (ref 150–450)
RBC: 4.83 x10E6/uL (ref 3.77–5.28)
RDW: 13.5 % (ref 11.7–15.4)
WBC: 7.5 x10E3/uL (ref 3.4–10.8)

## 2023-10-02 LAB — CMP14+EGFR
ALT: 18 IU/L (ref 0–32)
AST: 24 IU/L (ref 0–40)
Albumin: 4.5 g/dL (ref 3.8–4.8)
Alkaline Phosphatase: 88 IU/L (ref 49–135)
BUN/Creatinine Ratio: 19 (ref 12–28)
BUN: 14 mg/dL (ref 8–27)
Bilirubin Total: 0.6 mg/dL (ref 0.0–1.2)
CO2: 22 mmol/L (ref 20–29)
Calcium: 9.8 mg/dL (ref 8.7–10.3)
Chloride: 102 mmol/L (ref 96–106)
Creatinine, Ser: 0.75 mg/dL (ref 0.57–1.00)
Globulin, Total: 2.3 g/dL (ref 1.5–4.5)
Glucose: 114 mg/dL — ABNORMAL HIGH (ref 70–99)
Potassium: 4.3 mmol/L (ref 3.5–5.2)
Sodium: 142 mmol/L (ref 134–144)
Total Protein: 6.8 g/dL (ref 6.0–8.5)
eGFR: 82 mL/min/1.73 (ref >59)

## 2023-10-02 LAB — LIPID PANEL
Cholesterol, Total: 132 mg/dL (ref 100–199)
HDL: 37 mg/dL — AB (ref >39)
LDL CALC COMMENT:: 3.6 ratio (ref 0.0–4.4)
LDL Chol Calc (NIH): 67 mg/dL (ref 0–99)
Triglycerides: 166 mg/dL — AB (ref 0–149)
VLDL Cholesterol Cal: 28 mg/dL (ref 5–40)

## 2023-10-02 LAB — MICROALBUMIN / CREATININE URINE RATIO
Creatinine, Urine: 80.5 mg/dL
Microalb/Creat Ratio: 90 mg/g{creat} — ABNORMAL HIGH (ref 0–29)
Microalbumin, Urine: 72.7 ug/mL

## 2023-10-02 NOTE — Progress Notes (Signed)
 Cardiology Office Note  Date: 10/02/2023   ID: Rita Payne, DOB 10/12/1945, MRN [id number]  PCP:  Zollie Lowers, MD  Cardiologist:  Osmond Steckman P Sueanne Maniaci, MD Electrophysiologist:  None   History of Present Illness: Rita Payne is a 78 y.o. female  Referred to cardiology clinic for evaluation of sinus trigeminy.  EKG today showed NSR, first degree AV block.  EKG from June 2024 reviewed, in sinus trigeminy with first-degree AV block, frequent PACs.  Patient reports having palpitations, symptoms heart racing and sometimes feeling like her heart is skipping a beat.  She reported DOE x 6 months.  No orthopnea, PND, leg swelling.  She gets chest cramps and thinks this could be from gas.  This is resolved with antacids but sometimes it does not.  In the last 3 to 4 years, during winters, when she shoved snow, she did experience chest pain that lasted for about 5 to 10 minutes and resolved with rest.  Since then, she stopped doing any overexertion activities.  She has a history of diabetes mellitus type 2, not on insulin .  She has a family history of CHF.  Her 3 siblings passed away with congestive heart failure.  She reports dizziness but no dizziness with exertion.  She describes this dizziness as room spinning sensation.  No syncope, leg swelling.  Past Medical History:  Diagnosis Date   Allergy    Arthritis    Basal cell carcinoma    Cataract    Chronic kidney disease    left kidney tumor removed, benign   Diarrhea    First degree heart block    GERD (gastroesophageal reflux disease)    Hyperlipidemia    Hypertension    Leg fracture    Melanoma (HCC)    Mild intermittent asthma    Neuromuscular disorder (HCC)    neuropathy feet - mild   Pneumonia    SUI (stress urinary incontinence, female)    Type 2 diabetes mellitus (HCC)     Past Surgical History:  Procedure Laterality Date   ABDOMINAL HYSTERECTOMY  1987   BELPHAROPTOSIS REPAIR Bilateral 2009   9    CHOLECYSTECTOMY   1989   COLONOSCOPY     HERNIA REPAIR     incisional hernia from cholecystectomy   JOINT REPLACEMENT     kidney tumor removal   1986   KNEE ARTHROSCOPY Bilateral    LUMBAR LAMINECTOMY/DECOMPRESSION MICRODISCECTOMY N/A 12/08/2013   Procedure: LUMBAR DECOMPRESSION L4-L5,  L3-L4 ;  Surgeon: Reyes JAYSON Billing, MD;  Location: [facility];  Service: Orthopedics;  Laterality: N/A;   POLYPECTOMY     PUBOVAGINAL SLING N/A 10/03/2014   Procedure: CARLOYN GLADE;  Surgeon: Arlena Gal, MD;  Location: [facility];  Service: Urology;  Laterality: N/A;   REPLACEMENT TOTAL HIP W/  RESURFACING IMPLANTS Right    TONSILLECTOMY     TOTAL HIP ARTHROPLASTY Right 08/25/2012   Procedure: RIGHT TOTAL HIP ARTHROPLASTY ANTERIOR APPROACH;  Surgeon: Donnice JONETTA Car, MD;  Location: [facility];  Service: Orthopedics;  Laterality: Right;   TOTAL KNEE ARTHROPLASTY Left 04/08/2022   Procedure: TOTAL KNEE ARTHROPLASTY;  Surgeon: Melodi Lerner, MD;  Location: [facility];  Service: Orthopedics;  Laterality: Left;    Current Outpatient Medications  Medication Sig Dispense Refill   albuterol  (VENTOLIN  HFA) 108 (90 Base) MCG/ACT inhaler Inhale 2 puffs into the lungs every 4 (four) hours as needed for wheezing or shortness of breath. FOR CHRONIC ASTHMA 18 g 11   atorvastatin  (LIPITOR ) 80  MG tablet Take 1 tablet (80 mg total) by mouth daily. 90 tablet 3   celecoxib  (CELEBREX ) 200 MG capsule TAKE 1 CAPSULE EVERY 12 HOURS 180 capsule 3   cetirizine  (ZYRTEC ) 10 MG tablet Take 1 tablet (10 mg total) by mouth daily. 90 tablet 1   cholecalciferol  (VITAMIN D ) 1000 UNITS tablet Take 1,000 Units by mouth daily.     cloNIDine  (CATAPRES ) 0.1 MG tablet Take 1 tablet (0.1 mg total) by mouth 2 (two) times daily. 180 tablet 3   Dulaglutide  (TRULICITY ) 1.5 MG/0.5ML SOAJ INJECT THE CONTENTS OF 1 PEN UNDER THE SKIN WEEKLY 6 mL 3   Estradiol  (YUVAFEM ) 10 MCG TABS vaginal tablet INSERT 1 TABLET VAGINALLY TWICE A WEEK 26 tablet 3    fluticasone  (FLONASE ) 50 MCG/ACT nasal spray Place 2 sprays into both nostrils daily. 54.6 mL 3   Fluticasone -Salmeterol (ADVAIR DISKUS) 250-50 MCG/DOSE AEPB USE 1 INHALATION EVERY 12 HOURS 180 each 3   glucose blood (FREESTYLE LITE) test strip Use as instructed 200 strip 3   hydrochlorothiazide  (MICROZIDE ) 12.5 MG capsule Take 1 capsule (12.5 mg total) by mouth daily. 90 capsule 3   ibandronate  (BONIVA ) 150 MG tablet Take 1 tablet (150 mg total) by mouth every 30 (thirty) days. Take in the morning with a full glass of water , on an empty stomach, and do not take anything else by mouth or lie down for the next 30 min. 3 tablet 3   MAGNESIUM  PO Take 420 mg by mouth daily.     omeprazole  (PRILOSEC) 40 MG capsule Take 1 capsule (40 mg total) by mouth daily. 90 capsule 3   No current facility-administered medications for this visit.   Facility-Administered Medications Ordered in Other Visits  Medication Dose Route Frequency Provider Last Rate Last Admin   magnesium  citrate solution 1 Bottle  1 Bottle Oral Once Tannenbaum, Sigmund, MD       sodium phosphate  (FLEET) 7-19 GM/118ML enema 1 enema  1 enema Rectal Once Tannenbaum, Sigmund, MD       Allergies:  Ivp dye [iodinated contrast media], Shellfish allergy, Vasotec [enalapril], Adhesive [tape], Amlodipine , Gabapentin , Iodine , and Pyridium [phenazopyridine hcl]   Social History: The patient  reports that she quit smoking about 18 months ago. Her smoking use included cigarettes. She started smoking about 34 years ago. She has a 19.3 pack-year smoking history. She has never used smokeless tobacco. She reports that she does not drink alcohol and does not use drugs.   Family History: The patient's family history includes Alcohol abuse in her brother and father; Arrhythmia in her brother; Arthritis in her brother, brother, father, mother, sister, and sister; Arthritis/Rheumatoid in her brother and sister; Asthma in her sister; Cancer in her sister; Colon  polyps in her sister and sister; Depression in her brother, sister, and sister; Diabetes in her mother, paternal grandmother, sister, and sister; Drug abuse in her brother; Early death in her brother, brother, and sister; Heart attack in her brother and mother; Heart disease in her brother, brother, father, mother, and sister; Heart failure in her brother and sister; Hyperlipidemia in her sister; Hypertension in her brother, brother, father, mother, sister, sister, and sister; Kidney disease in her sister and sister; Miscarriages / Stillbirths in her daughter, mother, and sister; Obesity in her sister and sister; Stroke in her father, mother, and sister; Varicose Veins in her daughter; Vision loss in her brother.   ROS:  Please see the history of present illness. Otherwise, complete review of systems is positive for  none  All other systems are reviewed and negative.   Physical Exam: VS:  BP 138/78   Pulse 83   Ht 5' 4 (1.626 m)   Wt 182 lb 12.8 oz (82.9 kg)   LMP 04/14/1982   SpO2 94%   BMI 31.38 kg/m , BMI Body mass index is 31.38 kg/m.  Wt Readings from Last 3 Encounters:  10/02/23 182 lb 12.8 oz (82.9 kg)  10/01/23 181 lb (82.1 kg)  07/04/23 178 lb (80.7 kg)    General: Patient appears comfortable at rest. HEENT: Conjunctiva and lids normal, oropharynx clear with moist mucosa. Neck: Supple, no elevated JVP or carotid bruits, no thyromegaly. Lungs: Clear to auscultation, nonlabored breathing at rest. Cardiac: Regular rate and rhythm, no S3 or significant systolic murmur, no pericardial rub. Abdomen: Soft, nontender, no hepatomegaly, bowel sounds present, no guarding or rebound. Extremities: No pitting edema, distal pulses 2+. Skin: Warm and dry. Musculoskeletal: No kyphosis. Neuropsychiatric: Alert and oriented x3, affect grossly appropriate.  Recent Labwork: 10/01/2023: ALT 18; AST 24; BUN 14; Creatinine, Ser 0.75; Hemoglobin 15.3; Platelets 140; Potassium 4.3; Sodium 142      Component Value Date/Time   CHOL 132 10/01/2023 0854   CHOL 277 (H) 07/14/2012 0900   TRIG 166 (H) 10/01/2023 0854   TRIG 384 (H) 06/01/2014 0827   TRIG 393 (H) 07/14/2012 0900   HDL 37 (L) 10/01/2023 0854   HDL 33 (L) 06/01/2014 0827   HDL 34 (L) 07/14/2012 0900   CHOLHDL 3.6 10/01/2023 0854   LDLCALC 67 10/01/2023 0854   LDLCALC 155 (H) 08/03/2013 0811   LDLCALC 164 (H) 07/14/2012 0900    Assessment and Plan:  DOE Cardiac chest pain - Ongoing DOE x 6 months.  Associated with fatigue, low stamina levels. - She has a family history of congestive heart failure. Her 3 siblings passed away with CHF. -She also reported having chest cramps that partially resolved with antacids but she reported having chest pain with exertion (shoving snow) that lasted for about 5 to 10 minutes before it resolved completely with rest.  This happened for the last 3 to 4 years.  Since then, she stopped doing overexertion activities with no recurrence of chest pain. - Cardiac risk factors include HTN, DM 2. - Obtain echocardiogram. - Obtain cardiac stress PET/CT scan.  - Anaphylaxis to IV contrast.  Sinus trigeminy/frequent PACs - Symptomatic with palpitations (feels her heart skipping a beat and heart racing). - Obtain 2-week event monitor, non-live  Dizziness likely secondary to vertigo - Per PCP.   40 minutes spent in review the prior records, labs/tests, discussion of the above problems with the patient and documentation.  I answered all her questions.      Medication Adjustments/Labs and Tests Ordered: Current medicines are reviewed at length with the patient today.  Concerns regarding medicines are outlined above.    Disposition:  Follow up 3 months  Signed Beata Beason Priya Kemon Devincenzi, MD, 10/02/2023 2:02 PM    [facility] [address] [place], [place], [place] [address]

## 2023-10-02 NOTE — Patient Instructions (Addendum)
 Medication Instructions:  Your physician recommends that you continue on your current medications as directed. Please refer to the Current Medication list given to you today.  Labwork: none  Testing/Procedures: 14 Day ZIO XT Your physician has requested that you have an echocardiogram. Echocardiography is a painless test that uses sound waves to create images of your heart. It provides your doctor with information about the size and shape of your heart and how well your heart's chambers and valves are working. This procedure takes approximately one hour. There are no restrictions for this procedure. Please do NOT wear cologne, perfume, aftershave, or lotions (deodorant is allowed). Please arrive 15 minutes prior to your appointment time.  Please note: We ask at that you not bring children with you during ultrasound (echo/ vascular) testing. Due to room size and safety concerns, children are not allowed in the ultrasound rooms during exams. Our front office staff cannot provide observation of children in our lobby area while testing is being conducted. An adult accompanying a patient to their appointment will only be allowed in the ultrasound room at the discretion of the ultrasound technician under special circumstances. We apologize for any inconvenience. Cardiac PET Stress Test  Follow-Up: Your physician recommends that you schedule a follow-up appointment in: 3 months  Any Other Special Instructions Will Be Listed Below (If Applicable).  If you need a refill on your cardiac medications before your next appointment, please call your pharmacy.     Please report to Radiology at the [facility] Main Entrance 30 minutes early for your test.  [address] [place], [place] [address]                         How to Prepare for Your Cardiac PET/CT Stress Test:  Nothing to eat or drink, except water , 3 hours prior to arrival time.  NO caffeine/decaffeinated products, or chocolate  12 hours prior to arrival. (Please note decaffeinated beverages (teas/coffees) still contain caffeine).  If you have caffeine within 12 hours prior, the test will need to be rescheduled.  Medication instructions: Do not take erectile dysfunction medications for 72 hours prior to test (sildenafil, tadalafil) Do not take nitrates (isosorbide mononitrate, Ranexa) the day before or day of test Do not take tamsulosin the day before or morning of test Hold theophylline containing medications for 12 hours. Hold Dipyridamole 48 hours prior to the test.  Diabetic Preparation: If able to eat breakfast prior to 3 hour fasting, you may take all medications, including your insulin . Do not worry if you miss your breakfast dose of insulin  - start at your next meal. If you do not eat prior to 3 hour fast-Hold all diabetes (oral and insulin ) medications. Patients who wear a continuous glucose monitor MUST remove the device prior to scanning.  You may take your remaining medications with water .  NO perfume, cologne or lotion on chest or abdomen area. FEMALES - Please avoid wearing dresses to this appointment.  Total time is 1 to 2 hours; you may want to bring reading material for the waiting time.  IF YOU THINK YOU MAY BE PREGNANT, OR ARE NURSING PLEASE INFORM THE TECHNOLOGIST.  In preparation for your appointment, medication and supplies will be purchased.  Appointment availability is limited, so if you need to cancel or reschedule, please call the Radiology Department Scheduler at [phone] 24 hours in advance to avoid a cancellation fee of $100.00  What to Expect When you Arrive:  Once you arrive and check in for your appointment, you will be taken to a preparation room within the Radiology Department.  A technologist or Nurse will obtain your medical history, verify that you are correctly prepped for the exam, and explain the procedure.  Afterwards, an IV will be started in your arm and electrodes  will be placed on your skin for EKG monitoring during the stress portion of the exam. Then you will be escorted to the PET/CT scanner.  There, staff will get you positioned on the scanner and obtain a blood pressure and EKG.  During the exam, you will continue to be connected to the EKG and blood pressure machines.  A small, safe amount of a radioactive tracer will be injected in your IV to obtain a series of pictures of your heart along with an injection of a stress agent.    After your Exam:  It is recommended that you eat a meal and drink a caffeinated beverage to counter act any effects of the stress agent.  Drink plenty of fluids for the remainder of the day and urinate frequently for the first couple of hours after the exam.  Your doctor will inform you of your test results within 7-10 business days.  For more information and frequently asked questions, please visit our website: [email or web address]  For questions about your test or how to prepare for your test, please call: Cardiac Imaging Nurse Navigators Office: [phone]

## 2023-10-02 NOTE — Telephone Encounter (Signed)
 Checking percert on the following   LONG TERM MONITOR (3-14 DAYS)

## 2023-10-03 ENCOUNTER — Ambulatory Visit: Payer: Self-pay | Admitting: Family Medicine

## 2023-10-06 ENCOUNTER — Other Ambulatory Visit: Payer: Self-pay | Admitting: Medical Genetics

## 2023-10-20 ENCOUNTER — Ambulatory Visit (INDEPENDENT_AMBULATORY_CARE_PROVIDER_SITE_OTHER)

## 2023-10-20 ENCOUNTER — Ambulatory Visit
Admission: RE | Admit: 2023-10-20 | Discharge: 2023-10-20 | Disposition: A | Discharge status: Home / Self Care | Source: Ambulatory Visit | Attending: Family Medicine | Admitting: Family Medicine

## 2023-10-20 DIAGNOSIS — Z1231 Encounter for screening mammogram for malignant neoplasm of breast: Secondary | ICD-10-CM | POA: Diagnosis not present

## 2023-10-20 DIAGNOSIS — Z23 Encounter for immunization: Secondary | ICD-10-CM

## 2023-10-21 ENCOUNTER — Ambulatory Visit: Attending: Internal Medicine

## 2023-10-21 DIAGNOSIS — R0609 Other forms of dyspnea: Secondary | ICD-10-CM | POA: Diagnosis present

## 2023-10-21 LAB — ECHOCARDIOGRAM COMPLETE
AR max vel: 2.76 cm2
AV Peak grad: 9 mmHg
Ao pk vel: 1.5 m/s
Area-P 1/2: 3.28 cm2
Calc EF: 64.5 %
S' Lateral: 2.3 cm
Single Plane A2C EF: 65.1 %
Single Plane A4C EF: 64.1 %

## 2023-10-22 DIAGNOSIS — R42 Dizziness and giddiness: Secondary | ICD-10-CM | POA: Diagnosis not present

## 2023-10-31 ENCOUNTER — Ambulatory Visit: Payer: Self-pay | Admitting: Internal Medicine

## 2023-10-31 ENCOUNTER — Encounter (HOSPITAL_COMMUNITY): Payer: Self-pay

## 2023-10-31 ENCOUNTER — Encounter: Payer: Self-pay | Admitting: *Deleted

## 2023-11-03 DIAGNOSIS — R42 Dizziness and giddiness: Secondary | ICD-10-CM

## 2023-11-04 ENCOUNTER — Ambulatory Visit (HOSPITAL_COMMUNITY)
Admission: RE | Admit: 2023-11-04 | Discharge: 2023-11-04 | Disposition: A | Discharge status: Home / Self Care | Source: Ambulatory Visit | Attending: Internal Medicine | Admitting: Internal Medicine

## 2023-11-04 DIAGNOSIS — R079 Chest pain, unspecified: Secondary | ICD-10-CM | POA: Insufficient documentation

## 2023-11-04 LAB — NM PET CT CARDIAC PERFUSION MULTI W/ABSOLUTE BLOODFLOW
MBFR: 1.81
Nuc Rest EF: 64 %
Nuc Stress EF: 70 %
Rest MBF: 1.24 ml/g/min
Rest Nuclear Isotope Dose: 21.5 mCi
ST Depression (mm): 0 mm
Stress MBF: 2.24 ml/g/min
Stress Nuclear Isotope Dose: 21.5 mCi

## 2023-11-04 MED ORDER — REGADENOSON 0.4 MG/5ML IV SOLN
0.4000 mg | Freq: Once | INTRAVENOUS | Status: AC
  Administered 2023-11-04: 0.4 mg via INTRAVENOUS

## 2023-11-04 MED ORDER — RUBIDIUM RB82 GENERATOR (RUBYFILL)
21.5100 | PACK | Freq: Once | INTRAVENOUS | Status: AC
  Administered 2023-11-04: 21.51 via INTRAVENOUS

## 2023-11-04 MED ORDER — REGADENOSON 0.4 MG/5ML IV SOLN
INTRAVENOUS | Status: AC
  Filled 2023-11-04: qty 5

## 2023-11-04 MED ORDER — RUBIDIUM RB82 GENERATOR (RUBYFILL)
21.4500 | PACK | Freq: Once | INTRAVENOUS | Status: AC
  Administered 2023-11-04: 21.45 via INTRAVENOUS

## 2023-11-04 NOTE — Progress Notes (Signed)
 Pt. Tolerated lexi scan well.

## 2023-11-11 NOTE — Telephone Encounter (Signed)
 Patient informed and verbalized understanding of plan. Copy sent to PCP

## 2023-11-11 NOTE — Telephone Encounter (Signed)
 Pt returning call to a nurse

## 2023-11-11 NOTE — Telephone Encounter (Signed)
-----   Message from Vishnu P Mallipeddi sent at 11/06/2023  5:35 PM EDT ----- Abnormal stress test. Findings consistent with RCA ischemia with decrease in RCA stress flow. Schedule follow-up visit in 2-4 weeks to set her up for LHC. ----- Message ----- From: Interface, Rad Results In Sent: 11/04/2023  10:36 AM EDT To: Vishnu P Mallipeddi, MD

## 2023-11-13 ENCOUNTER — Ambulatory Visit: Attending: Nurse Practitioner | Admitting: Nurse Practitioner

## 2023-11-13 ENCOUNTER — Encounter: Payer: Self-pay | Admitting: Nurse Practitioner

## 2023-11-13 VITALS — BP 130/70 | HR 76 | Ht 64.0 in | Wt 186.6 lb

## 2023-11-13 DIAGNOSIS — E119 Type 2 diabetes mellitus without complications: Secondary | ICD-10-CM | POA: Insufficient documentation

## 2023-11-13 DIAGNOSIS — E669 Obesity, unspecified: Secondary | ICD-10-CM | POA: Insufficient documentation

## 2023-11-13 DIAGNOSIS — R9439 Abnormal result of other cardiovascular function study: Secondary | ICD-10-CM | POA: Insufficient documentation

## 2023-11-13 DIAGNOSIS — R0609 Other forms of dyspnea: Secondary | ICD-10-CM | POA: Diagnosis present

## 2023-11-13 DIAGNOSIS — R079 Chest pain, unspecified: Secondary | ICD-10-CM | POA: Diagnosis present

## 2023-11-13 DIAGNOSIS — Z01818 Encounter for other preprocedural examination: Secondary | ICD-10-CM | POA: Diagnosis not present

## 2023-11-13 MED ORDER — DIPHENHYDRAMINE HCL 50 MG PO TABS: ORAL_TABLET | ORAL | 0 refills | Status: AC

## 2023-11-13 MED ORDER — PREDNISONE 50 MG PO TABS: ORAL_TABLET | ORAL | 0 refills | Status: DC

## 2023-11-13 MED ORDER — DIPHENHYDRAMINE HCL 50 MG PO TABS: ORAL_TABLET | ORAL | 0 refills | Status: DC

## 2023-11-13 MED ORDER — PREDNISONE 50 MG PO TABS: ORAL_TABLET | ORAL | 0 refills | Status: AC

## 2023-11-13 NOTE — Progress Notes (Addendum)
 Cardiology Office Note   Date:  11/13/2023 ID:  Jameson Morrow, DOB [birth year], MRN [id number] PCP: Zollie Lowers, MD  [facility] Providers Cardiologist:  Diannah SHAUNNA Maywood, MD     History of Present Illness Shareese Macha is a 78 y.o. female with a PMH of chest pain, DOE, palpitations, type 2 diabetes, frequent PAC's, sinus trigeminy, dizziness, who presents today for abnormal stress test follow-up.   Last seen by Dr. Mallipeddi on 10/02/2023.  She reported dyspnea on exertion x 6 months.  Patient also reported chest cramps that resolved with antacids but sometimes did not.  Noted chest pain while shoveling snow during winter months that would happen during exertion and resolved with rest.  And since then, it was noted that she stopped doing any activities that caused overexertion.  She described vertigo dizziness sensation.  She underwent cardiac stress PET/CT scan.  Test was considered abnormal and findings were consistent with RCA ischemia with decrease in RCA stress flows, study was found to be intermediate risk due to decrease in blood flow reserve.  See full report below.  Stress EF 70%.  Echo showed normal LVEF, no segment valvular disease.  She wore a cardiac monitor also that showed predominantly normal sinus rhythm with average heart rate 84 bpm.  She had 6 pauses, longest lasting 3.9 seconds with evidence of first-degree AV block, 3 runs of SVT, brief in duration.  No malignant arrhythmias noted.  She is here for follow-up based on these abnormal stress test results.  She states she notes a recent episode of chest discomfort last night of what she describes as a gas bubble, was noted to along her right breast tissue and described as stabbing sensation. After occurring 4 times, it stopped and only lasted for maybe 2-3 minutes. Says it has not recurred and she did not try any medications during this time.  Does get short of breath with exertion that she wonders is related to possible  asthma. Denies any palpitations, syncope, presyncope, dizziness, orthopnea, PND, swelling or significant weight changes, acute bleeding, or claudication.  She is allergic to contrast dye.   FH: Family history of heart failure.  Also does have positive family history of MI/PCI, stroke.  ROS: Negative. See HPI.   Studies Reviewed  EKG:  EKG Interpretation Date/Time:  Thursday November 13 2023 14:39:56 EST Ventricular Rate:  76 PR Interval:  252 QRS Duration:  114 QT Interval:  398 QTC Calculation: 447 R Axis:   82  Text Interpretation: Sinus rhythm with 1st degree A-V block Low voltage QRS Incomplete right bundle branch block When compared with ECG of 02-Oct-2023 13:55, No significant change was found Confirmed by Miriam Norris [phone]) on 11/13/2023 2:45:11 PM   Nuclear medicine PET CT cardiac 11/04/2023:   Findings are consistent with RCA ischemia with decrease in RCA stress flows. The study is intermediate risk due to decrease in blood flow reserve.   LV perfusion is abnormal. There is evidence of ischemia. There is no evidence of infarction. Defect 1: There is a small defect with moderate reduction in uptake present in the mid to basal inferior location(s) that is reversible. There is normal wall motion in the defect area. Consistent with ischemia.   Rest left ventricular function is normal. Rest EF: 64%. Stress left ventricular function is normal. Stress EF: 70%. End diastolic cavity size is normal. End systolic cavity size is normal.   Myocardial blood flow was computed to be 1.52ml/g/min at rest and 2.24ml/g/min at stress. Global  myocardial blood flow reserve was 1.81 and was mildly abnormal.   Coronary calcium  was present on the attenuation correction CT images. Severe coronary calcifications were present. Coronary calcifications were present in the left anterior descending artery, left circumflex artery and right coronary artery distribution(s).  Aortic atherosclerosis noted.    Electronically Signed  By: Stanly Leavens M.D.   Cardiac monitor 11/03/2023:   Patch wear time was for 13 days and 23 hours.   Normal sinus rhythm predominantly ranging from 49 to 138 bpm with an average HR of 84 bpm.   3 runs of nonsustained SVT occurred, fastest interval lasting 4 beats and longest interval lasting 5 beats.   6 pauses occurred, longest lasting 3.9 seconds. First-degree AV block noted.   No evidence of atrial fibrillation/flutter, ventricular arrhythmias, high-grade AV block.   <1% PAC burden and <1% PVC burden.   No patient triggered events were noted.  Echo 10/21/2023: 1. Left ventricular ejection fraction, by estimation, is 60 to 65%. Left  ventricular ejection fraction by 3D volume is 61 %. The left ventricle has  normal function. The left ventricle has no regional wall motion  abnormalities. There is mild concentric  left ventricular hypertrophy. Left ventricular diastolic parameters are  consistent with Grade I diastolic dysfunction (impaired relaxation). The  average left ventricular global longitudinal strain is -17.9 %. The global  longitudinal strain is normal.   2. Right ventricular systolic function is normal. The right ventricular  size is normal. There is normal pulmonary artery systolic pressure. The  estimated right ventricular systolic pressure is 20.8 mmHg.   3. The mitral valve is degenerative. Trivial mitral valve regurgitation.   4. The aortic valve is tricuspid. Aortic valve regurgitation is not  visualized.   5. The inferior vena cava is normal in size with greater than 50%  respiratory variability, suggesting right atrial pressure of 3 mmHg.   Comparison(s): No prior Echocardiogram.  Physical Exam VS:  BP 130/70   Pulse 76   Ht 5' 4 (1.626 m)   Wt 186 lb 9.6 oz (84.6 kg)   LMP 04/14/1982   SpO2 95%   BMI 32.03 kg/m        Wt Readings from Last 3 Encounters:  11/13/23 186 lb 9.6 oz (84.6 kg)  10/02/23 182 lb 12.8 oz (82.9 kg)   10/01/23 181 lb (82.1 kg)    GEN: Obese, 78 y.o. female in no acute distress NECK: No JVD; No carotid bruits CARDIAC: S1/S2, RRR, no murmurs, rubs, gallops RESPIRATORY:  Clear to auscultation without rales, wheezing or rhonchi  ABDOMEN: Soft, non-tender, non-distended EXTREMITIES:  No edema; No deformity   ASSESSMENT AND PLAN  Abnormal stress test, chest pain of uncertain etiology, dyspnea on exertion, preop eval Admits to recent episode of chest pain, also admits to DOE.  Reviewed most recent nuclear medicine PET stress test that was abnormal and showed findings consistent with RCA ischemia with decrease in RCA stress flow.  Recommended by attending cardiologist to set patient up for left heart cath.  I reviewed risk versus benefits with her and she verbalized understanding is agreeable to proceed.  Ran case past Dr. Bettyjo in the office who recommended just a left heart cath for further evaluation as her recent Echo was benign. Will obtain CBC and BMET.  Will reach out to heart cath RN as she has allergy to contrast dye.  No medication changes at this time. Care and ED precautions discussed. Orders are under signed and held.  Informed Consent   Shared Decision Making/Informed Consent The risks [stroke (1 in 1000), death (1 in 1000), kidney failure [usually temporary] (1 in 500), bleeding (1 in 200), allergic reaction [possibly serious] (1 in 200)], benefits (diagnostic support and management of coronary artery disease) and alternatives of a cardiac catheterization were discussed in detail with Ms. Riss and she is willing to proceed.  2. T2DM Most recent A1c was 6.5%.  Continue follow-up with PCP who is managing this.  3. Obesity Weight loss via diet and exercise encouraged. Discussed the impact being overweight would have on cardiovascular risk.  Dispo: Follow-up with MD/APP in 4-6 weeks or sooner if anything changes.   Signed, Almarie Crate, NP

## 2023-11-13 NOTE — Patient Instructions (Addendum)
 Medication Instructions:  Your physician recommends that you continue on your current medications as directed. Please refer to the Current Medication list given to you today.  *If you need a refill on your cardiac medications before your next appointment, please call your pharmacy*  Lab Work: NOone If you have labs (blood work) drawn today and your tests are completely normal, you will receive your results only by: MyChart Message (if you have MyChart) OR A paper copy in the mail If you have any lab test that is abnormal or we need to change your treatment, we will call you to review the results.  Testing/Procedures: None  Follow-Up: At [facility], you and your health needs are our priority.  As part of our continuing mission to provide you with exceptional heart care, our providers are all part of one team.  This team includes your primary Cardiologist (physician) and Advanced Practice Providers or APPs (Physician Assistants and Nurse Practitioners) who all work together to provide you with the care you need, when you need it.  Your next appointment:   4-6 week(s)  Provider:   Almarie Crate, NP      We recommend signing up for the patient portal called MyChart.  Sign up information is provided on this After Visit Summary.  MyChart is used to connect with patients for Virtual Visits (Telemedicine).  Patients are able to view lab/test results, encounter notes, upcoming appointments, etc.  Non-urgent messages can be sent to your provider as well.   To learn more about what you can do with MyChart, go to [email or web address].   Other Instructions            [facility] A DEPT OF [place] [address] [place] [address] Dept: [phone] Loc: [phone]  Amran Malter  11/13/2023  You are scheduled for a Cardiac Catheterization on Friday, November 14 with Dr. Peter Jordan.  1.  Please arrive at the [facility] (Main Entrance A) at [facility]: [address] [place], [place] [address] at 5:30 AM (This time is 2 hour(s) before your procedure to ensure your preparation).   Free valet parking service is available. You will check in at ADMITTING. The support person will be asked to wait in the waiting room.  It is OK to have someone drop you off and come back when you are ready to be discharged.    Special note: Every effort is made to have your procedure done on time. Please understand that emergencies sometimes delay scheduled procedures.  2. Diet: Nothing to eat after midnight.   3. Hydration: You need to be well hydrated before your procedure. On November 14, you may drink approved liquids (see below) until 2 hours before the procedure, with 16 oz of water  as your last intake.   List of approved liquids water , clear juice, clear tea, black coffee, fruit juices, non-citric and without pulp, carbonated beverages, Gatorade, Kool -Aid, plain Jello-O and plain ice popsicles.  4. Labs: You will need to have blood drawn on   5. Medication instructions in preparation for your procedure:   Contrast Allergy: Yes, Please take Prednisone  50mg  by mouth at: Thirteen hours prior to cath 6:00pm on Thursday Seven hours prior to cath 12:00am on Friday And prior to leaving home please take last dose of Prednisone  50mg  and Benadryl  50mg  by mouth.  On the morning of your procedure, take your Aspirin  81 mg and  any morning medicines NOT listed above.  You may use sips of water .  6. Plan to go home the same day, you will only stay overnight if medically necessary. 7. Bring a current list of your medications and current insurance cards. 8. You MUST have a responsible person to drive you home. 9. Someone MUST be with you the first 24 hours after you arrive home or your discharge will be delayed. 10. Please wear clothes that are easy to get on and off and wear slip-on  shoes.  Thank you for allowing us  to care for you!   -- [facility]

## 2023-11-17 ENCOUNTER — Ambulatory Visit: Payer: Self-pay | Admitting: Nurse Practitioner

## 2023-11-17 ENCOUNTER — Other Ambulatory Visit

## 2023-11-17 ENCOUNTER — Other Ambulatory Visit (HOSPITAL_COMMUNITY)
Admission: RE | Admit: 2023-11-17 | Discharge: 2023-11-17 | Disposition: A | Discharge status: Home / Self Care | Source: Ambulatory Visit | Attending: Medical Genetics | Admitting: Medical Genetics

## 2023-11-17 ENCOUNTER — Other Ambulatory Visit (HOSPITAL_COMMUNITY)
Admission: RE | Admit: 2023-11-17 | Discharge: 2023-11-17 | Disposition: A | Discharge status: Home / Self Care | Source: Ambulatory Visit | Attending: Nurse Practitioner | Admitting: Nurse Practitioner

## 2023-11-17 DIAGNOSIS — Z01812 Encounter for preprocedural laboratory examination: Secondary | ICD-10-CM | POA: Insufficient documentation

## 2023-11-17 LAB — CBC
HCT: 42.5 % (ref 36.0–46.0)
Hemoglobin: 14.3 g/dL (ref 12.0–15.0)
MCH: 31.6 pg (ref 26.0–34.0)
MCHC: 33.6 g/dL (ref 30.0–36.0)
MCV: 94 fL (ref 80.0–100.0)
Platelets: 220 K/uL (ref 150–400)
RBC: 4.52 MIL/uL (ref 3.87–5.11)
RDW: 13.8 % (ref 11.5–15.5)
WBC: 9 K/uL (ref 4.0–10.5)
nRBC: 0 % (ref 0.0–0.2)

## 2023-11-17 LAB — BASIC METABOLIC PANEL WITH GFR
Anion gap: 9 (ref 5–15)
BUN: 16 mg/dL (ref 8–23)
CO2: 29 mmol/L (ref 22–32)
Calcium: 9.5 mg/dL (ref 8.9–10.3)
Chloride: 101 mmol/L (ref 98–111)
Creatinine, Ser: 0.69 mg/dL (ref 0.44–1.00)
GFR, Estimated: >60 mL/min (ref >60)
Glucose, Bld: 136 mg/dL — ABNORMAL HIGH (ref 70–99)
Potassium: 4.8 mmol/L (ref 3.5–5.1)
Sodium: 139 mmol/L (ref 135–145)

## 2023-11-17 NOTE — Progress Notes (Signed)
 Yes, that looks fine. Thanks!

## 2023-11-17 NOTE — H&P (View-Only) (Signed)
 Yes, that looks fine. Thanks!

## 2023-11-17 NOTE — Addendum Note (Signed)
 Addended by: MIRIAM NORRIS on: 11/17/2023 12:11 PM   Modules accepted: Orders

## 2023-11-18 ENCOUNTER — Telehealth: Payer: Self-pay | Admitting: *Deleted

## 2023-11-18 NOTE — Telephone Encounter (Signed)
 Cardiac Catheterization scheduled at [facility] for: Friday November 21, 2023 7:30 AM Arrival time [facility] Main Entrance A at: 5:30 AM  Diet: -Nothing to eat after midnight.  Hydration: -May drink clear liquids until 2 hours before the procedure.  Approved liquids: Water , clear tea, black coffee, fruit juices-non-citric and without pulp,Gatorade, plain Jello/popsicles.   -Please drink 16  oz of water  2 hours before procedure.  CONTRAST ALLERGY: 13 hour Prednisone  and Benadryl  Prep: 11/20/23 Prednisone  50 mg 6:30 PM 11/21/23 Prednisone  50 mg 12 :30 AM 11/21/23 Prednisone  50 mg and Benadryl  50 mg just prior to leaving home for hospital/on the way to hospital. Patient advised not to drive after taking Benadryl .  Medication instructions: -Hold:  Hydrochlorothiazide -AM of procedure -Other usual morning medications can be taken including aspirin  81 mg.  Pt tells me Trulicity  weekly on Sundays  Plan to go home the same day, you will only stay overnight if medically necessary.  You must have responsible adult to drive you home.  Someone must be with you the first 24 hours after you arrive home.  Reviewed procedure instructions with patient.

## 2023-11-19 NOTE — Telephone Encounter (Signed)
 Copied from CRM [id number]. Topic: Clinical - Lab/Test Results >> Nov 18, 2023  5:21 PM Tobias CROME wrote: Reason for CRM: Patient returning call. Patient states she was not fasting. Patient informed she didn't have to fast.

## 2023-11-21 ENCOUNTER — Other Ambulatory Visit: Payer: Self-pay

## 2023-11-21 ENCOUNTER — Encounter (HOSPITAL_COMMUNITY): Admission: RE | Disposition: A | Payer: Self-pay | Source: Home / Self Care | Attending: Cardiology

## 2023-11-21 ENCOUNTER — Ambulatory Visit (HOSPITAL_COMMUNITY)
Admission: RE | Admit: 2023-11-21 | Discharge: 2023-11-21 | Disposition: A | Discharge status: Home / Self Care | Attending: Cardiology | Admitting: Cardiology

## 2023-11-21 DIAGNOSIS — I2510 Atherosclerotic heart disease of native coronary artery without angina pectoris: Secondary | ICD-10-CM | POA: Diagnosis not present

## 2023-11-21 DIAGNOSIS — R079 Chest pain, unspecified: Secondary | ICD-10-CM

## 2023-11-21 DIAGNOSIS — R9439 Abnormal result of other cardiovascular function study: Secondary | ICD-10-CM

## 2023-11-21 HISTORY — PX: LEFT HEART CATH AND CORONARY ANGIOGRAPHY: CATH118249

## 2023-11-21 LAB — GLUCOSE, CAPILLARY
Glucose-Capillary: 221 mg/dL — ABNORMAL HIGH (ref 70–99)
Glucose-Capillary: 204 mg/dL — ABNORMAL HIGH (ref 70–99)

## 2023-11-21 SURGERY — LEFT HEART CATH AND CORONARY ANGIOGRAPHY
Anesthesia: LOCAL

## 2023-11-21 MED ORDER — FENTANYL CITRATE (PF) 100 MCG/2ML IJ SOLN
INTRAMUSCULAR | Status: AC
  Filled 2023-11-21: qty 2

## 2023-11-21 MED ORDER — LIDOCAINE HCL (PF) 1 % IJ SOLN
INTRAMUSCULAR | Status: DC | PRN
Start: 2023-11-21 — End: 2023-11-21
  Administered 2023-11-21: 2 mL

## 2023-11-21 MED ORDER — SODIUM CHLORIDE 0.9 % IV SOLN
250.0000 mL | INTRAVENOUS | Status: DC | PRN
Start: 2023-11-21 — End: 2023-11-21

## 2023-11-21 MED ORDER — SODIUM CHLORIDE 0.9% FLUSH: 3.0000 mL | INTRAVENOUS | Status: DC | PRN

## 2023-11-21 MED ORDER — FREE WATER: 500.0000 mL | Freq: Once | Status: DC

## 2023-11-21 MED ORDER — HEPARIN (PORCINE) IN NACL 2-0.9 UNITS/ML
INTRAMUSCULAR | Status: DC | PRN
  Administered 2023-11-21: 10 mL via INTRA_ARTERIAL

## 2023-11-21 MED ORDER — LIDOCAINE HCL (PF) 1 % IJ SOLN
INTRAMUSCULAR | Status: AC
  Filled 2023-11-21: qty 30

## 2023-11-21 MED ORDER — HEPARIN (PORCINE) IN NACL 1000-0.9 UT/500ML-% IV SOLN
INTRAVENOUS | Status: DC | PRN
  Administered 2023-11-21: 1000 mL via SURGICAL_CAVITY

## 2023-11-21 MED ORDER — HYDRALAZINE HCL 20 MG/ML IJ SOLN
10.0000 mg | INTRAMUSCULAR | Status: DC | PRN
  Administered 2023-11-21: 10 mg via INTRAVENOUS
  Filled 2023-11-21: qty 1

## 2023-11-21 MED ORDER — HEPARIN SODIUM (PORCINE) 1000 UNIT/ML IJ SOLN
INTRAMUSCULAR | Status: DC | PRN
Start: 2023-11-21 — End: 2023-11-21
  Administered 2023-11-21: 4000 [IU] via INTRAVENOUS

## 2023-11-21 MED ORDER — IOHEXOL 350 MG/ML SOLN
INTRAVENOUS | Status: DC | PRN
Start: 2023-11-21 — End: 2023-11-21
  Administered 2023-11-21: 45 mL via INTRA_ARTERIAL

## 2023-11-21 MED ORDER — SODIUM CHLORIDE 0.9 % IV SOLN: 250.0000 mL | INTRAVENOUS | Status: DC | PRN

## 2023-11-21 MED ORDER — SODIUM CHLORIDE 0.9% FLUSH: 3.0000 mL | Freq: Two times a day (BID) | INTRAVENOUS | Status: DC

## 2023-11-21 MED ORDER — FENTANYL CITRATE (PF) 100 MCG/2ML IJ SOLN
INTRAMUSCULAR | Status: DC | PRN
  Administered 2023-11-21: 25 ug via INTRAVENOUS

## 2023-11-21 MED ORDER — ASPIRIN 81 MG PO CHEW: 81.0000 mg | CHEWABLE_TABLET | ORAL | Status: DC

## 2023-11-21 MED ORDER — MIDAZOLAM HCL 2 MG/2ML IJ SOLN
INTRAMUSCULAR | Status: AC
  Filled 2023-11-21: qty 2

## 2023-11-21 MED ORDER — MIDAZOLAM HCL (PF) 2 MG/2ML IJ SOLN
INTRAMUSCULAR | Status: DC | PRN
Start: 2023-11-21 — End: 2023-11-21
  Administered 2023-11-21: 1 mg via INTRAVENOUS

## 2023-11-21 MED ORDER — VERAPAMIL HCL 2.5 MG/ML IV SOLN
INTRAVENOUS | Status: AC
  Filled 2023-11-21: qty 2

## 2023-11-21 MED ORDER — ACETAMINOPHEN 325 MG PO TABS: 650.0000 mg | ORAL_TABLET | ORAL | Status: DC | PRN

## 2023-11-21 MED ORDER — HEPARIN SODIUM (PORCINE) 1000 UNIT/ML IJ SOLN
INTRAMUSCULAR | Status: AC
  Filled 2023-11-21: qty 10

## 2023-11-21 MED ORDER — ONDANSETRON HCL 4 MG/2ML IJ SOLN: 4.0000 mg | Freq: Four times a day (QID) | INTRAMUSCULAR | Status: DC | PRN

## 2023-11-21 SURGICAL SUPPLY — 11 items
CATH INFINITI 5 FR JL3.5 (CATHETERS) IMPLANT
CATH INFINITI 5FR AL1 (CATHETERS) IMPLANT
CATH INFINITI JR4 5F (CATHETERS) IMPLANT
DEVICE RAD COMP TR BAND LRG (VASCULAR PRODUCTS) IMPLANT
GLIDESHEATH SLEND SS 6F .021 (SHEATH) IMPLANT
GUIDEWIRE INQWIRE 1.5J.035X260 (WIRE) IMPLANT
KIT SYRINGE INJ CVI SPIKEX1 (MISCELLANEOUS) IMPLANT
PACK CARDIAC CATHETERIZATION (CUSTOM PROCEDURE TRAY) ×1 IMPLANT
SET ATX-X65L (MISCELLANEOUS) IMPLANT
SHEATH PROBE COVER 6X72 (BAG) IMPLANT
WIRE HI TORQ VERSACORE-J 145CM (WIRE) IMPLANT

## 2023-11-21 NOTE — Progress Notes (Signed)
 Discharge instructions reviewed with patient and daughter at bedside. Denies questions concerns. PT tolerated PO intake. Ambulated in the hallway, was able to void without difficulty. Incision site remains clean dry and intact. No s/s of complications. PT escorted from the unit via wheel chair to personal vehicle.

## 2023-11-21 NOTE — Interval H&P Note (Signed)
 History and Physical Interval Note:  11/21/2023 7:05 AM  Rita Payne  has presented today for surgery, with the diagnosis of abnormal stress test - doe - cp.  The various methods of treatment have been discussed with the patient and family. After consideration of risks, benefits and other options for treatment, the patient has consented to  Procedure(s): LEFT HEART CATH AND CORONARY ANGIOGRAPHY (N/A) as a surgical intervention.  The patient's history has been reviewed, patient examined, no change in status, stable for surgery.  I have reviewed the patient's chart and labs.  Questions were answered to the patient's satisfaction.    Cath Lab Visit (complete for each Cath Lab visit)  Clinical Evaluation Leading to the Procedure:   ACS: No.  Non-ACS:    Anginal Classification: CCS II  Anti-ischemic medical therapy: Minimal Therapy (1 class of medications)  Non-Invasive Test Results: Intermediate-risk stress test findings: cardiac mortality 1-3%/year  Prior CABG: No previous CABG       [facility] 11/21/2023 7:05 AM

## 2023-11-21 NOTE — Discharge Instructions (Addendum)
 Radial Site Care The following information offers guidance on how to care for yourself after your procedure. Your health care provider may also give you more specific instructions. If you have problems or questions, contact your health care provider. What can I expect after the procedure? After the procedure, it is common to have bruising and tenderness in the incision area. Follow these instructions at home: Incision site care  Follow instructions from your health care provider about how to take care of your incision site. Make sure you: Wash your hands with soap and water for at least 20 seconds before and after you change your bandage (dressing). If soap and water are not available, use hand sanitizer. Remove your dressing in 24 hours. Leave stitches (sutures), skin glue, or adhesive strips in place. These skin closures may need to stay in place for 2 weeks or longer. If adhesive strip edges start to loosen and curl up, you may trim the loose edges. Do not remove adhesive strips completely unless your health care provider tells you to do that. Do not take baths, swim, or use a hot tub for at least 1 week. You may shower 24 hours after the procedure or as told by your health care provider. Remove the dressing and gently wash the incision area with plain soap and water. Pat the area dry with a clean towel. Do not rub the site. That could cause bleeding. Do not apply powder or lotion to the site. Check your incision site every day for signs of infection. Check for: Redness, swelling, or pain. Fluid or blood. Warmth. Pus or a bad smell. Activity For 24 hours after the procedure, or as directed by your health care provider: Do not flex or bend the affected arm. Do not push or pull heavy objects with the affected arm. Do not operate machinery or power tools. Do not drive. You should not drive yourself home from the hospital or clinic if you go home during that time period. You may drive 24  hours after the procedure unless your health care provider tells you not to. Do not lift anything that is heavier than 10 lb (4.5 kg), or the limit that you are told, until your health care provider says that it is safe. Return to your normal activities as told by your health care provider. Ask your health care provider what activities are safe for you and when you can return to work. If you were given a sedative during the procedure, it can affect you for several hours. Do not drive or operate machinery until your health care provider says that it is safe. General instructions Take over-the-counter and prescription medicines only as told by your health care provider. If you will be going home right after the procedure, plan to have a responsible adult care for you for the time you are told. This is important. Keep all follow-up visits. This is important. Contact a health care provider if: You have a fever or chills. You have any of these signs of infection at your incision site: Redness, swelling, or pain. Fluid or blood. Warmth. Pus or a bad smell. Get help right away if: The incision area swells very fast. The incision area is bleeding, and the bleeding does not stop when you hold steady pressure on the area. Your arm or hand becomes pale, cool, tingly, or numb. These symptoms may represent a serious problem that is an emergency. Do not wait to see if the symptoms will go away. Get medical  help right away. Call your local emergency services (911 in the [place] Do not drive yourself to the hospital. Summary After the procedure, it is common to have bruising and tenderness at the incision site. Follow instructions from your health care provider about how to take care of your radial site incision. Check the incision every day for signs of infection. Do not lift anything that is heavier than 10 lb (4.5 kg), or the limit that you are told, until your health care provider says that it is  safe. Get help right away if the incision area swells very fast, you have bleeding at the incision site that will not stop, or your arm or hand becomes pale, cool, or numb. This information is not intended to replace advice given to you by your health care provider. Make sure you discuss any questions you have with your health care provider. Document Revised: 02/13/2020 Document Reviewed: 02/13/2020 Elsevier Patient Education  2024 [employer].

## 2023-11-23 ENCOUNTER — Encounter (HOSPITAL_COMMUNITY): Payer: Self-pay | Admitting: Cardiology

## 2023-11-25 LAB — GENECONNECT MOLECULAR SCREEN: Genetic Analysis Overall Interpretation: NEGATIVE

## 2023-11-27 ENCOUNTER — Emergency Department (HOSPITAL_COMMUNITY)

## 2023-11-27 ENCOUNTER — Emergency Department (HOSPITAL_COMMUNITY)
Admission: EM | Admit: 2023-11-27 | Discharge: 2023-11-27 | Disposition: A | Discharge status: Home / Self Care | Attending: Emergency Medicine | Admitting: Emergency Medicine

## 2023-11-27 ENCOUNTER — Other Ambulatory Visit: Payer: Self-pay

## 2023-11-27 ENCOUNTER — Encounter (HOSPITAL_COMMUNITY): Payer: Self-pay | Admitting: Emergency Medicine

## 2023-11-27 ENCOUNTER — Ambulatory Visit: Payer: Self-pay

## 2023-11-27 DIAGNOSIS — Z7982 Long term (current) use of aspirin: Secondary | ICD-10-CM | POA: Insufficient documentation

## 2023-11-27 DIAGNOSIS — R220 Localized swelling, mass and lump, head: Secondary | ICD-10-CM | POA: Diagnosis present

## 2023-11-27 DIAGNOSIS — M1909 Primary osteoarthritis, other specified site: Secondary | ICD-10-CM | POA: Diagnosis not present

## 2023-11-27 DIAGNOSIS — I10 Essential (primary) hypertension: Secondary | ICD-10-CM | POA: Diagnosis not present

## 2023-11-27 LAB — CBC WITH DIFFERENTIAL/PLATELET
Abs Immature Granulocytes: 0.03 K/uL (ref 0.00–0.07)
Basophils Absolute: 0 K/uL (ref 0.0–0.1)
Basophils Relative: 0 %
Eosinophils Absolute: 0.1 K/uL (ref 0.0–0.5)
Eosinophils Relative: 2 %
HCT: 44.8 % (ref 36.0–46.0)
Hemoglobin: 15.2 g/dL — ABNORMAL HIGH (ref 12.0–15.0)
Immature Granulocytes: 0 %
Lymphocytes Relative: 19 %
Lymphs Abs: 1.8 K/uL (ref 0.7–4.0)
MCH: 31.4 pg (ref 26.0–34.0)
MCHC: 33.9 g/dL (ref 30.0–36.0)
MCV: 92.6 fL (ref 80.0–100.0)
Monocytes Absolute: 0.9 K/uL (ref 0.1–1.0)
Monocytes Relative: 9 %
Neutro Abs: 6.7 K/uL (ref 1.7–7.7)
Neutrophils Relative %: 70 %
Platelets: 223 K/uL (ref 150–400)
RBC: 4.84 MIL/uL (ref 3.87–5.11)
RDW: 13.8 % (ref 11.5–15.5)
WBC: 9.6 K/uL (ref 4.0–10.5)
nRBC: 0 % (ref 0.0–0.2)

## 2023-11-27 LAB — BASIC METABOLIC PANEL WITH GFR
Anion gap: 12 (ref 5–15)
BUN: 17 mg/dL (ref 8–23)
CO2: 27 mmol/L (ref 22–32)
Calcium: 10 mg/dL (ref 8.9–10.3)
Chloride: 100 mmol/L (ref 98–111)
Creatinine, Ser: 0.76 mg/dL (ref 0.44–1.00)
GFR, Estimated: >60 mL/min (ref >60)
Glucose, Bld: 139 mg/dL — ABNORMAL HIGH (ref 70–99)
Potassium: 4.1 mmol/L (ref 3.5–5.1)
Sodium: 138 mmol/L (ref 135–145)

## 2023-11-27 LAB — RESP PANEL BY RT-PCR (RSV, FLU A&B, COVID)  RVPGX2
Influenza A by PCR: NEGATIVE
Influenza B by PCR: NEGATIVE
Resp Syncytial Virus by PCR: NEGATIVE
SARS Coronavirus 2 by RT PCR: NEGATIVE

## 2023-11-27 LAB — GROUP A STREP BY PCR: Group A Strep by PCR: NOT DETECTED

## 2023-11-27 NOTE — ED Triage Notes (Signed)
 Pt had a heart cath 6 days ago. Pt has a known allergy to to dye used and was given steroids and benadryl  before and after procedure. 2 days after cath started to have sharp pain mouth and throat then started to have dizziness, nausea and blurred vision on and off  for past 5 days. Blood pressure was elevated at time of procedure and has follow up appt 12/4 to have blood pressure evaluated and medication changed. After cath no medications were changed and no interventions were needed. Last night pt started to left left sided mouth swelling and sore needed. Swelling noted to left lower saw.

## 2023-11-27 NOTE — Discharge Instructions (Signed)
 You were seen in the emergency department for possible allergic reaction.  You had lab work and a CAT scan of your face that did not show any obvious abnormalities.  You can do antihistamines such as Benadryl  or Claritin .  Tylenol  for pain.  Follow-up with your primary care doctor.  Return to the emergency department if any worsening or concerning symptoms

## 2023-11-27 NOTE — ED Provider Notes (Signed)
 [place] EMERGENCY DEPARTMENT AT [facility] Provider Note   CSN: [id number] Arrival date & time: 11/27/23  9083     Patient presents with: Facial Swelling   Rita Payne is a 78 y.o. female.  She had a cardiac cath done 6 days ago.  She has a contrast allergy so had to pretreat with steroids the day before and the day of.  She said the day after her cath she had a severe sore throat that lasted for a few days.  Throat is improved but has felt dizzy with intermittent blurry vision.  Today has pain and swelling of her chin and left jaw.  Went to [place] PCP who sent her here for possible allergic reaction.  No new medications other than what she took for the cath.  Tried Benadryl  last night.  Has not had anything today.  Blood pressure has been elevated for a few weeks.  She has an appointment with cardiology in a few weeks to evaluate and adjust medications.  No chest pain or shortness of breath.  No vomiting or diarrhea.   The history is provided by the patient.  Allergic Reaction Presenting symptoms: difficulty swallowing and swelling   Presenting symptoms: no difficulty breathing, no itching and no rash   Difficulty swallowing:    Severity:  Moderate   Onset quality:  Gradual   Duration:  5 days   Timing:  Constant   Progression:  Improving Severity:  Moderate Duration:  1 day Ineffective treatments:  Antihistamines      Prior to Admission medications   Medication Sig Start Date End Date Taking? Authorizing Provider  albuterol  (VENTOLIN  HFA) 108 (90 Base) MCG/ACT inhaler Inhale 2 puffs into the lungs every 4 (four) hours as needed for wheezing or shortness of breath. FOR CHRONIC ASTHMA 10/01/23   Zollie Lowers, MD  aspirin  EC 81 MG tablet Take 81 mg by mouth once. Swallow whole.    [provider]  atorvastatin  (LIPITOR ) 80 MG tablet Take 1 tablet (80 mg total) by mouth daily. 10/01/23   Zollie Lowers, MD  celecoxib  (CELEBREX ) 200 MG capsule TAKE 1  CAPSULE EVERY 12 HOURS 10/01/23   Zollie Lowers, MD  cetirizine  (ZYRTEC ) 10 MG tablet Take 1 tablet (10 mg total) by mouth daily. 10/01/23   Zollie Lowers, MD  cholecalciferol  (VITAMIN D ) 1000 UNITS tablet Take 1,000 Units by mouth daily.    [provider]  cloNIDine  (CATAPRES ) 0.1 MG tablet Take 1 tablet (0.1 mg total) by mouth 2 (two) times daily. 10/01/23   Zollie Lowers, MD  diphenhydrAMINE  (BENADRYL ) 50 MG tablet Take 1 tablet by mouth prior to procedure 11/13/23   Miriam Norris, NP  docusate sodium  (COLACE) 100 MG capsule Take 100 mg by mouth daily.    [provider]  Dulaglutide  (TRULICITY ) 1.5 MG/0.5ML SOAJ INJECT THE CONTENTS OF 1 PEN UNDER THE SKIN WEEKLY 10/01/23   Zollie Lowers, MD  Estradiol  (YUVAFEM ) 10 MCG TABS vaginal tablet INSERT 1 TABLET VAGINALLY TWICE A WEEK 10/01/23   Zollie Lowers, MD  fluticasone  (FLONASE ) 50 MCG/ACT nasal spray Place 2 sprays into both nostrils daily. Patient taking differently: Place 2 sprays into both nostrils at bedtime. 10/01/23   Zollie Lowers, MD  glucose blood (FREESTYLE LITE) test strip Use as instructed 10/01/23   Zollie Lowers, MD  hydrochlorothiazide  (MICROZIDE ) 12.5 MG capsule Take 1 capsule (12.5 mg total) by mouth daily. Patient taking differently: Take 12.5 mg by mouth daily as needed (swelling in feet). 10/01/23  Zollie Lowers, MD  ibandronate  (BONIVA ) 150 MG tablet Take 1 tablet (150 mg total) by mouth every 30 (thirty) days. Take in the morning with a full glass of water , on an empty stomach, and do not take anything else by mouth or lie down for the next 30 min. 10/01/23   Zollie Lowers, MD  MAGNESIUM  PO Take 420 mg by mouth daily.    [provider]  omeprazole  (PRILOSEC) 40 MG capsule Take 1 capsule (40 mg total) by mouth daily. 10/01/23   Zollie Lowers, MD  predniSONE  (DELTASONE ) 50 MG tablet Take 1 tablet by mouth 13 hours prior to procedure, take 1 tablet by mouth 7 hours prior to procedure, take 1 tablet  by mouth beforee you leave home for procedure. 11/13/23   Miriam Norris, NP  Propylene Glycol, PF, (SYSTANE COMPLETE PF) 0.6 % SOLN Place 1 drop into both eyes every evening.    [provider]  Specialty Vitamins Products (BLINK NUTRITEARS) CAPS Take 1 tablet by mouth daily.    [provider]    Allergies: Ivp dye [iodinated contrast media], Shellfish allergy, Vasotec [enalapril], Adhesive [tape], Amlodipine , Gabapentin , Iodine , and Pyridium [phenazopyridine hcl]    Review of Systems  Constitutional:  Negative for fever.  HENT:  Positive for facial swelling, sore throat and trouble swallowing.   Eyes:  Positive for visual disturbance.  Respiratory:  Negative for shortness of breath.   Cardiovascular:  Negative for chest pain.  Genitourinary:  Negative for dysuria.  Skin:  Negative for itching and rash.  Neurological:  Positive for dizziness.    Updated Vital Signs BP (!) 191/100   Pulse 81   Temp 98.1 F (36.7 C) (Oral)   Resp 17   Ht 5' 4 (1.626 m)   Wt 84.4 kg   LMP 04/14/1982   SpO2 97%   BMI 31.93 kg/m   Physical Exam Vitals and nursing note reviewed.  Constitutional:      General: She is not in acute distress.    Appearance: Normal appearance. She is well-developed.  HENT:     Head: Normocephalic and atraumatic.     Comments: She has some tenderness of her chin and her left jaw.  There is no gross swelling or erythema.  No adenopathy.  No stridor or trismus. Eyes:     Conjunctiva/sclera: Conjunctivae normal.  Cardiovascular:     Rate and Rhythm: Normal rate and regular rhythm.     Heart sounds: No murmur heard. Pulmonary:     Effort: Pulmonary effort is normal. No respiratory distress.     Breath sounds: Normal breath sounds.  Abdominal:     Palpations: Abdomen is soft.     Tenderness: There is no abdominal tenderness.  Musculoskeletal:        General: No swelling.     Cervical back: Neck supple.  Skin:    General: Skin is warm and dry.      Capillary Refill: Capillary refill takes less than 2 seconds.  Neurological:     General: No focal deficit present.     Mental Status: She is alert and oriented to person, place, and time.     Cranial Nerves: No cranial nerve deficit.     Sensory: No sensory deficit.     Motor: No weakness.     (all labs ordered are listed, but only abnormal results are displayed) Labs Reviewed  BASIC METABOLIC PANEL WITH GFR - Abnormal; Notable for the following components:      Result  Value   Glucose, Bld 139 (*)    All other components within normal limits  CBC WITH DIFFERENTIAL/PLATELET - Abnormal; Notable for the following components:   Hemoglobin 15.2 (*)    All other components within normal limits  GROUP A STREP BY PCR  RESP PANEL BY RT-PCR (RSV, FLU A&B, COVID)  [id number]    EKG: EKG Interpretation Date/Time:  Thursday November 27 2023 09:34:13 EST Ventricular Rate:  83 PR Interval:  223 QRS Duration:  125 QT Interval:  387 QTC Calculation: 455 R Axis:   64  Text Interpretation: Sinus rhythm Prolonged PR interval Right bundle branch block Minimal ST elevation, inferior leads No significant change since prior 11/25 Confirmed by Towana Sharper [phone]) on 11/27/2023 9:47:43 AM  Radiology: CT Maxillofacial Wo Contrast Result Date: 11/27/2023 EXAM: CT OF THE FACE WITHOUT CONTRAST 11/27/2023 10:27:20 AM TECHNIQUE: CT of the face was performed without the administration of intravenous contrast. Multiplanar reformatted images are provided for review. Automated exposure control, iterative reconstruction, and/or weight based adjustment of the mA/kV was utilized to reduce the radiation dose to as low as reasonably achievable. COMPARISON: None available. CLINICAL HISTORY: 78 year old female. Sublingual/submandibular abscess; left jaw pain. FINDINGS: FACIAL BONES: All dentition is absent. Mandible is intact and normally located. Mild temporomandibular joint degeneration for age, appearing  asymmetrically greater on the left. Background bone mineralization is within normal limits for age. No acute facial fracture. No mandibular dislocation. No suspicious bone lesion. ORBITS: Globes are intact. Orbit soft tissues appear symmetric and normal. No acute traumatic injury. No inflammatory change. SINUSES AND MASTOIDS: Visible paranasal sinuses, middle ears, and mastoids are well aerated. No acute abnormality. SOFT TISSUES: Bulky calcified carotid atherosclerosis in the left neck. No visible non-contrast pharynx, larynx, parapharyngeal spaces, retropharyngeal space, sublingual space, submandibular spaces, masticator spaces, or parotid spaces. No visible scalp abnormality. No superficial soft tissue inflammation or abnormality identified. BRAIN: No visible non-contrast brain parenchyma abnormality for age. IMPRESSION: 1. No acute or inflammatory process identified in the non-contrast Face. 2. Absent dentition.  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 3. Bulky calcified left carotid atherosclerosis. Electronically signed by: Helayne Hurst MD 11/27/2023 11:12 AM EST RP Workstation: [id number]     Procedures   Medications Ordered in the ED - No data to display  Clinical Course as of 11/27/23 1650  Thu Nov 27, 2023  1122 Patient's lab work and imaging fairly unremarkable.  Glucose was a little elevated but she just went through a course of steroids.  Recommended symptomatic treatment with antihistamines.  Tylenol  for pain.  Close follow-up with her PCP.  Return instructions discussed [MB]    Clinical Course User Index [MB] Towana Sharper BROCKS, MD                                 Medical Decision Making Amount and/or Complexity of Data Reviewed Labs: ordered. Radiology: ordered.   This patient complains of facial swelling and pain, possible allergic reaction; this involves an extensive number of treatment Options and is a complaint that carries with it a high risk of complications  and morbidity. The differential includes strep throat, COVID, cellulitis, abscess  I ordered, reviewed and interpreted labs, which included CBC normal chemistries with mildly elevated glucose, COVID and strep negative I ordered imaging studies which included CT max face and I independently    visualized and interpreted imaging which showed mild degenerative changes TMJ Previous records  obtained and reviewed in epic including recent cardiology notes  Cardiac monitoring reviewed, sinus rhythm Social determinants considered, no significant barriers Critical Interventions: None  After the interventions stated above, I reevaluated the patient and found patient to be well-appearing with stable vitals in no distress Admission and further testing considered, no indications for admission at this time.  Not sure if steroids are contributing to her problem so we will hold off on that.  Patient instructed to continue antihistamines.  Return instructions discussed.      Final diagnoses:  Facial swelling    ED Discharge Orders     None          Towana Ozell BROCKS, MD 11/27/23 1652

## 2023-11-27 NOTE — Telephone Encounter (Signed)
 FYI Only or Action Required?: FYI only for provider: ED advised.  Patient was last seen in primary care on 10/01/2023 by Zollie Lowers, MD.  Called Nurse Triage reporting Oral Pain.  Symptoms began yesterday.  Interventions attempted: OTC medications: benadryl  and prednisone .  Symptoms are: gradually worsening.  Triage Disposition: Go to ED Now (or PCP Triage)  Patient/caregiver understands and will follow disposition?: Yes, will follow disposition  Copied from CRM [id number]. Topic: Clinical - Red Word Triage >> Nov 27, 2023  8:05 AM Emylou G wrote: [facility] that prompted transfer to Nurse Triage: Allergy reaction to the Cardiac catheterization Sore, dizzy, swolllen mouth, nausea Reason for Disposition  Patient sounds very sick or weak to the triager  Answer Assessment - Initial Assessment Questions 1. SYMPTOM: What's the main symptom you're concerned about? (e.g., chapped lips, dry mouth, lump, sores)     Swelling inside mouth,  2. ONSET: When did the  swelling and pain  start?     After the heart cath with dye 3. PAIN: Is there any pain? If Yes, ask: How bad is it? (Scale: 0-10; or none, mild, moderate, severe)     Moderate to severe 4. CAUSE: What do you think is causing the symptoms?     Had heart cath and dye, pt has allergy to dye, states they did give her prednisone  5. OTHER SYMPTOMS: Do you have any other symptoms? (e.g., fever, sore throat, toothache, swelling)     Oral swelling, oral pain, denies difficulty breathing, denies SOB,  Pt states lips and gums are swollen. Took benadryl  today and yesterday evening, no relief.  Protocols used: Mouth Symptoms-A-AH, Lip Swelling-A-AH

## 2023-12-24 ENCOUNTER — Ambulatory Visit: Admitting: Neurology

## 2023-12-24 ENCOUNTER — Encounter: Payer: Self-pay | Admitting: Neurology

## 2023-12-24 VITALS — BP 178/79 | HR 76 | Ht 64.0 in | Wt 184.4 lb

## 2023-12-24 DIAGNOSIS — G4733 Obstructive sleep apnea (adult) (pediatric): Secondary | ICD-10-CM | POA: Diagnosis not present

## 2023-12-24 DIAGNOSIS — Z789 Other specified health status: Secondary | ICD-10-CM | POA: Diagnosis not present

## 2023-12-24 DIAGNOSIS — R030 Elevated blood-pressure reading, without diagnosis of hypertension: Secondary | ICD-10-CM | POA: Diagnosis not present

## 2023-12-24 NOTE — Progress Notes (Signed)
 Subjective:    Patient ID: Rita Payne is a 78 y.o. female.  HPI    Interim history:   Rita Payne is a 78 year old female with an underlying medical history of allergies, arthritis, chronic kidney disease, history of first-degree heart block, hypertension, hyperlipidemia, melanoma, intermittent asthma, type 2 diabetes, and mild obesity, who presents for follow-up consultation of her obstructive sleep apnea after interim testing and starting home AutoPap therapy.  The patient is unaccompanied today.  I first met her at the request of her primary care physician on 04/28/2023, at which time she reported witnessed apneas as well as snoring and daytime somnolence.  She was advised to proceed with a sleep study.  She had a home sleep test through our office on 05/11/2023 which showed overall mild obstructive sleep apnea but desaturation to 72%.  AHI was 11.6/h, O2 nadir 71.9% with intermittent snoring detected.  She was advised to proceed with home AutoPap therapy.  Her set up date was 06/25/2023.  She has a ResMed AirSense 11 AutoSet machine.  Her DME company is [employer].  Today, 12/24/2023: I reviewed her AutoPap compliance data for the first month, she had 1 day of usage at the time.  I also reviewed her compliance data for 90 days between July and end of September, she used her machine only 16 out of 90 days with an average pressure of 8.6 cm, range of 5 to 12 cm with EPR of 2.  Residual AHI at goal at 3.2/h.  She reports that she is still healing from her lower lip swelling and splinting of her lip as well as sore gums.  She is able to use her dentures now.  She has not restarted her AutoPap and admits that she did not use it very consistently even before she had the allergic reaction.  She does endorse that she felt better when she was able to use her AutoPap.  She is going to have a follow-up with cardiology and primary care soon.  I reviewed her emergency room encounter from 11/27/2023.  She presented  with facial swelling.  She had a prior heart cath about a week prior.  She has a known contrast allergy and was premedicated with steroids at the time.    The patient's allergies, current medications, family history, past medical history, past social history, past surgical history and problem list were reviewed and updated as appropriate.   Previously:  04/28/2023: (She) reports snoring and excessive daytime somnolence as well as witnessed apneas, per son's report.  Her Epworth sleepiness score is 9 out of 24, fatigue severity score is 24 out of 63.  I reviewed your office note from 03/17/2023. Of note, she had a sleep study over 10 years ago and was diagnosed with obstructive sleep apnea at the time and was given a CPAP machine or AutoPap machine.  She struggled with her sleep at the time because she had severe pain from hip arthritis and did not get comfortable with her PAP machine, used it for about 6 months.  She would be willing to get retested and consider PAP therapy again if the need arises.  She would prefer a home sleep test.  She lives alone, she has 2 dogs in the household.  Bedtime is late, between midnight and 1:30 AM and rise time is around 6 AM.  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  She had a total hip replacement on the right side about 10 years ago and total  knee replacement on the left side in 2024.  She has no nightly nocturia, denies recurrent morning headaches.  Her other sister had sleep apnea, she had a PAP machine, she passed away.  Patient quit smoking last year.  She does not drink any alcohol, she drinks caffeine in form of tea and soda, about 4-5 servings per day on average.  She does not watch TV in her bedroom.    Her Past Medical History Is Significant For: Past Medical History:  Diagnosis Date   Allergy    Arthritis    Basal cell carcinoma    Cataract    Chronic kidney disease    left kidney tumor removed, benign   Diarrhea    First degree heart block    GERD  (gastroesophageal reflux disease)    Hyperlipidemia    Hypertension    Leg fracture    Melanoma (HCC)    Mild intermittent asthma    Neuromuscular disorder (HCC)    neuropathy feet - mild   Pneumonia    SUI (stress urinary incontinence, female)    Type 2 diabetes mellitus (HCC)     Her Past Surgical History Is Significant For: Past Surgical History:  Procedure Laterality Date   ABDOMINAL HYSTERECTOMY  1987   BELPHAROPTOSIS REPAIR Bilateral 2009   11    CHOLECYSTECTOMY  1989   COLONOSCOPY     HERNIA REPAIR     incisional hernia from cholecystectomy   JOINT REPLACEMENT     kidney tumor removal   1986   KNEE ARTHROSCOPY Bilateral    LEFT HEART CATH AND CORONARY ANGIOGRAPHY N/A 11/21/2023   Procedure: LEFT HEART CATH AND CORONARY ANGIOGRAPHY;  Surgeon: Jordan, Peter M, MD;  Location: [facility];  Service: Cardiovascular;  Laterality: N/A;   LUMBAR LAMINECTOMY/DECOMPRESSION MICRODISCECTOMY N/A 12/08/2013   Procedure: LUMBAR DECOMPRESSION L4-L5,  L3-L4 ;  Surgeon: Reyes JAYSON Billing, MD;  Location: [facility];  Service: Orthopedics;  Laterality: N/A;   POLYPECTOMY     PUBOVAGINAL SLING N/A 10/03/2014   Procedure: CARLOYN GLADE;  Surgeon: Arlena Gal, MD;  Location: [facility];  Service: Urology;  Laterality: N/A;   REPLACEMENT TOTAL HIP W/  RESURFACING IMPLANTS Right    TONSILLECTOMY     TOTAL HIP ARTHROPLASTY Right 08/25/2012   Procedure: RIGHT TOTAL HIP ARTHROPLASTY ANTERIOR APPROACH;  Surgeon: Donnice JONETTA Car, MD;  Location: [facility];  Service: Orthopedics;  Laterality: Right;   TOTAL KNEE ARTHROPLASTY Left 04/08/2022   Procedure: TOTAL KNEE ARTHROPLASTY;  Surgeon: Melodi Lerner, MD;  Location: [facility];  Service: Orthopedics;  Laterality: Left;    Her Family History Is Significant For: Family History  Problem Relation Age of Onset   Diabetes Mother    Heart disease Mother    Stroke Mother    Heart attack Mother    Arthritis Mother    Hypertension  Mother    Miscarriages / Stillbirths Mother    Heart disease Father    Stroke Father    Alcohol abuse Father    Arthritis Father    Hypertension Father    Diabetes Sister    Heart disease Sister    Heart failure Sister    Early death Sister    Kidney disease Sister    Obesity Sister    Hypertension Sister    Diabetes Sister    Stroke Sister    Colon polyps Sister    Kidney disease Sister    Obesity Sister    Arthritis Sister  Cancer Sister    Depression Sister    Hypertension Sister    Miscarriages / Stillbirths Sister    Arthritis/Rheumatoid Sister    Colon polyps Sister    Arthritis Sister    Asthma Sister    Depression Sister    Hyperlipidemia Sister    Hypertension Sister    Varicose Veins Daughter    Miscarriages / Stillbirths Daughter    Vision loss Brother    Arthritis/Rheumatoid Brother    Early death Brother    Heart disease Brother    Arthritis Brother    Hypertension Brother    Heart disease Brother    Heart failure Brother    Heart attack Brother    Arrhythmia Brother    Early death Brother    Alcohol abuse Brother    Arthritis Brother    Depression Brother    Drug abuse Brother    Hypertension Brother    Diabetes Paternal Grandmother    Colon cancer Neg Hx    Esophageal cancer Neg Hx    Stomach cancer Neg Hx    Rectal cancer Neg Hx    Breast cancer Neg Hx     Her Social History Is Significant For: Social History   Socioeconomic History   Marital status: Widowed    Spouse name: Not on file   Number of children: 2   Years of education: some college   Highest education level: Some college, no degree  Occupational History   Occupation: It Consultant     Comment: 3 days per wk/ [employer]; 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  Tobacco Use   Smoking status: Former    Current packs/day: 0.00    Average packs/day: 0.3 packs/day for 57.4 years (19.3 ttl pk-yrs)    Types: Cigarettes    Start date: 03/19/1989    Quit  date: 03/20/2022    Years since quitting: 1.7   Smokeless tobacco: Never   Tobacco comments:    Have finally quit smoking  Vaping Use   Vaping status: Never Used  Substance and Sexual Activity   Alcohol use: Never   Drug use: Never   Sexual activity: Not Currently    Birth control/protection: None  Other Topics Concern   Not on file  Social History Narrative   Currently working as a it consultant part time - lives alone. Son in [place] , daughter in [place]. 2 sisters live nearby   Pt lives alone    Social Drivers of Health   Tobacco Use: Medium Risk (12/24/2023)   Patient History    Smoking Tobacco Use: Former    Smokeless Tobacco Use: Never    Passive Exposure: Not on file  Financial Resource Strain: Low Risk (07/04/2023)   Overall Financial Resource Strain (CARDIA)    Difficulty of Paying Living Expenses: Not hard at all  Food Insecurity: No Food Insecurity (07/04/2023)   Epic    Worried About Radiation Protection Practitioner of Food in the Last Year: Never true    Ran Out of Food in the Last Year: Never true  Transportation Needs: No Transportation Needs (07/04/2023)   Epic    Lack of Transportation (Medical): No    Lack of Transportation (Non-Medical): No  Physical Activity: Insufficiently Active (07/04/2023)   Exercise Vital Sign    Days of Exercise per Week: 3 days    Minutes of Exercise per Session: 30 min  Stress: No Stress Concern Present (07/04/2023)   [employer] of Occupational Health - Occupational Stress Questionnaire  Feeling of Stress: Not at all  Social Connections: Moderately Isolated (07/04/2023)   Social Connection and Isolation Panel    Frequency of Communication with Friends and Family: More than three times a week    Frequency of Social Gatherings with Friends and Family: Once a week    Attends Religious Services: 1 to 4 times per year    Active Member of [employer] or Organizations: No    Attends Banker Meetings: Never    Marital Status: Widowed   Depression (PHQ2-9): Low Risk (10/01/2023)   Depression (PHQ2-9)    PHQ-2 Score: 0  Alcohol Screen: Low Risk (07/03/2022)   Alcohol Screen    Last Alcohol Screening Score (AUDIT): 0  Housing: Unknown (07/04/2023)   Epic    Unable to Pay for Housing in the Last Year: No    Number of Times Moved in the Last Year: Not on file    Homeless in the Last Year: No  Utilities: Not At Risk (07/04/2023)   Epic    Threatened with loss of utilities: No  Health Literacy: Adequate Health Literacy (07/04/2023)   B1300 Health Literacy    Frequency of need for help with medical instructions: Never    Her Allergies Are:  Allergies[1]:   Her Current Medications Are:  Outpatient Encounter Medications as of 12/24/2023  Medication Sig   albuterol  (VENTOLIN  HFA) 108 (90 Base) MCG/ACT inhaler Inhale 2 puffs into the lungs every 4 (four) hours as needed for wheezing or shortness of breath. FOR CHRONIC ASTHMA   aspirin  EC 81 MG tablet Take 81 mg by mouth once. Swallow whole.   atorvastatin  (LIPITOR ) 80 MG tablet Take 1 tablet (80 mg total) by mouth daily.   celecoxib  (CELEBREX ) 200 MG capsule TAKE 1 CAPSULE EVERY 12 HOURS   cetirizine  (ZYRTEC ) 10 MG tablet Take 1 tablet (10 mg total) by mouth daily.   cholecalciferol  (VITAMIN D ) 1000 UNITS tablet Take 1,000 Units by mouth daily.   cloNIDine  (CATAPRES ) 0.1 MG tablet Take 1 tablet (0.1 mg total) by mouth 2 (two) times daily.   diphenhydrAMINE  (BENADRYL ) 50 MG tablet Take 1 tablet by mouth prior to procedure   docusate sodium  (COLACE) 100 MG capsule Take 100 mg by mouth daily.   Dulaglutide  (TRULICITY ) 1.5 MG/0.5ML SOAJ INJECT THE CONTENTS OF 1 PEN UNDER THE SKIN WEEKLY   Estradiol  (YUVAFEM ) 10 MCG TABS vaginal tablet INSERT 1 TABLET VAGINALLY TWICE A WEEK   fluticasone  (FLONASE ) 50 MCG/ACT nasal spray Place 2 sprays into both nostrils daily. (Patient taking differently: Place 2 sprays into both nostrils at bedtime.)   glucose blood (FREESTYLE LITE) test strip  Use as instructed   hydrochlorothiazide  (MICROZIDE ) 12.5 MG capsule Take 1 capsule (12.5 mg total) by mouth daily. (Patient taking differently: Take 12.5 mg by mouth daily as needed (swelling in feet).)   ibandronate  (BONIVA ) 150 MG tablet Take 1 tablet (150 mg total) by mouth every 30 (thirty) days. Take in the morning with a full glass of water , on an empty stomach, and do not take anything else by mouth or lie down for the next 30 min.   MAGNESIUM  PO Take 420 mg by mouth daily.   omeprazole  (PRILOSEC) 40 MG capsule Take 1 capsule (40 mg total) by mouth daily.   predniSONE  (DELTASONE ) 50 MG tablet Take 1 tablet by mouth 13 hours prior to procedure, take 1 tablet by mouth 7 hours prior to procedure, take 1 tablet by mouth beforee you leave home for procedure.  Propylene Glycol, PF, (SYSTANE COMPLETE PF) 0.6 % SOLN Place 1 drop into both eyes every evening.   Specialty Vitamins Products (BLINK NUTRITEARS) CAPS Take 1 tablet by mouth daily.   Facility-Administered Encounter Medications as of 12/24/2023  Medication   magnesium  citrate solution 1 Bottle   sodium phosphate  (FLEET) 7-19 GM/118ML enema 1 enema  :  Review of Systems:  Out of a complete 14 point review of systems, all are reviewed and negative with the exception of these symptoms as listed below:   Review of Systems  Objective:  Neurological Exam  Physical Exam Physical Examination:   Vitals:   12/24/23 0944  BP: (!) 178/79  Pulse: 76   She denies any symptoms from having an elevated systolic blood pressure today.  General Examination: The patient is a very pleasant 78 y.o. female in no acute distress. She appears well-developed and well-nourished and well groomed.   HEENT: Normocephalic, atraumatic, pupils are equal, round and reactive to light, extraocular tracking is good without limitation to gaze excursion or nystagmus noted. Hearing is grossly intact. Face is symmetric with normal facial animation. Speech is clear  with no dysarthria noted. There is no hypophonia. There is no lip, neck/head, jaw or voice tremor.  No obvious lip or lower gum lesion noted, very minor lower lip swelling but no redness.  Lips are dry.   Airway examination reveals mouth dryness with full dentures.  Stable findings overall.  Chest: Clear to auscultation without wheezing, rhonchi or crackles noted.   Heart: S1+S2+0, regular and normal without murmurs, rubs or gallops noted.    Abdomen: Soft, non-tender and non-distended.   Extremities: There is trace swelling in the left ankle area.    Skin: Warm and dry without trophic changes noted.    Musculoskeletal: exam reveals left knee wider compared to the right, stable.     Neurologically:  Mental status: The patient is awake, alert and oriented in all 4 spheres. Her immediate and remote memory, attention, language skills and fund of knowledge are appropriate. There is no evidence of aphasia, agnosia, apraxia or anomia. Speech is clear with normal prosody and enunciation. Thought process is linear. Mood is normal and affect is normal.  Cranial nerves II - XII are as described above under HEENT exam.  Motor exam: Normal bulk, moving all 4 extremities without obvious restriction, no obvious action or resting tremor.  Fine motor skills and coordination: grossly intact.  Cerebellar testing: No dysmetria or intention tremor. There is no truncal or gait ataxia.  Sensory exam: intact to light touch in the upper and lower extremities.  Gait, station and balance: She stands without difficulty, she walks without a walking aid.   Assessment and Plan:  In summary, Rita Payne is a 78 year old female with an underlying medical history of allergies, arthritis, chronic kidney disease, history of first-degree heart block, hypertension, hyperlipidemia, melanoma, intermittent asthma, type 2 diabetes, and mild obesity, who presents for follow-up consultation of her obstructive sleep apnea after  interim testing and starting home AutoPap therapy.  She had a home sleep test through our office on 05/11/2023 which showed overall mild obstructive sleep apnea but desaturation to 72%.  AHI was 11.6/h, O2 nadir 71.9% with intermittent snoring detected.  She has been on AutoPap therapy since June 2025 but has not been consistent with treatment.  She had an episode of allergic reaction after her cardiac cath and could not tolerate her CPAP mask after that but she has healed and reports that she  actually felt better when she used her AutoPap.  She is willing to get back on it.  She is advised to use her machine consistently to reap full benefit from it.  She is advised to follow-up routinely in this clinic to see one of our nurse practitioners in about a year, sooner if needed.  I answered all her questions today and she was in agreement.      [1]  Allergies Allergen Reactions   Ivp Dye [Iodinated Contrast Media] Anaphylaxis   Shellfish Allergy Anaphylaxis   Vasotec [Enalapril] Shortness Of Breath   Adhesive [Tape] Other (See Comments)    blisters   Amlodipine  Swelling   Gabapentin  Other (See Comments)    drowsiness   Iodine  Other (See Comments)    Iodine  that is applied to skin causes blisters    Pyridium [Phenazopyridine Hcl]     blisters

## 2023-12-26 ENCOUNTER — Encounter: Payer: Self-pay | Admitting: Nurse Practitioner

## 2023-12-26 ENCOUNTER — Other Ambulatory Visit (HOSPITAL_BASED_OUTPATIENT_CLINIC_OR_DEPARTMENT_OTHER): Payer: Self-pay

## 2023-12-26 ENCOUNTER — Ambulatory Visit: Attending: Nurse Practitioner | Admitting: Nurse Practitioner

## 2023-12-26 VITALS — BP 160/82 | HR 92 | Ht 64.0 in | Wt 184.8 lb

## 2023-12-26 DIAGNOSIS — R0609 Other forms of dyspnea: Secondary | ICD-10-CM | POA: Insufficient documentation

## 2023-12-26 DIAGNOSIS — I6522 Occlusion and stenosis of left carotid artery: Secondary | ICD-10-CM | POA: Diagnosis present

## 2023-12-26 DIAGNOSIS — E119 Type 2 diabetes mellitus without complications: Secondary | ICD-10-CM | POA: Insufficient documentation

## 2023-12-26 DIAGNOSIS — H538 Other visual disturbances: Secondary | ICD-10-CM | POA: Insufficient documentation

## 2023-12-26 DIAGNOSIS — I10 Essential (primary) hypertension: Secondary | ICD-10-CM | POA: Diagnosis present

## 2023-12-26 DIAGNOSIS — E669 Obesity, unspecified: Secondary | ICD-10-CM | POA: Insufficient documentation

## 2023-12-26 DIAGNOSIS — I2510 Atherosclerotic heart disease of native coronary artery without angina pectoris: Secondary | ICD-10-CM | POA: Insufficient documentation

## 2023-12-26 MED ORDER — METOPROLOL SUCCINATE ER 25 MG PO TB24
12.5000 mg | ORAL_TABLET | Freq: Every day | ORAL | 6 refills | Status: AC
  Filled 2023-12-26: qty 15, 30d supply, fill #0
  Filled 2024-01-27: qty 15, 30d supply, fill #1

## 2023-12-26 MED ORDER — METOPROLOL SUCCINATE ER 25 MG PO TB24: 12.5000 mg | ORAL_TABLET | Freq: Every day | ORAL | 6 refills | Status: DC

## 2023-12-26 NOTE — Patient Instructions (Signed)
 Medication Instructions:   Begin Toprol XL at 12.5mg  daily  Continue all other medications.     Labwork:  none  Testing/Procedures:  Your physician has requested that you have a carotid duplex. This test is an ultrasound of the carotid arteries in your neck. It looks at blood flow through these arteries that supply the brain with blood. Allow one hour for this exam. There are no restrictions or special instructions.  Office will contact with results via phone, letter or mychart.     Follow-Up:  4-6 weeks   Any Other Special Instructions Will Be Listed Below (If Applicable).  BP Log  [place] [facility]   If you need a refill on your cardiac medications before your next appointment, please call your pharmacy.

## 2023-12-26 NOTE — Progress Notes (Unsigned)
 " Cardiology Office Note   Date:  11/13/2023 ID:  Jonnelle Lawniczak, DOB Jul 10, 1945, MRN [id number] PCP: Zollie Lowers, MD  [facility] Providers Cardiologist:  Diannah SHAUNNA Maywood, MD     History of Present Illness Rita Payne is a 78 y.o. female with a PMH of chest pain, DOE, palpitations, type 2 diabetes, frequent PAC's, sinus trigeminy, dizziness, who presents today for abnormal stress test follow-up.   Last seen by Dr. Mallipeddi on 10/02/2023.  She reported dyspnea on exertion x 6 months.  Patient also reported chest cramps that resolved with antacids but sometimes did not.  Noted chest pain while shoveling snow during winter months that would happen during exertion and resolved with rest.  And since then, it was noted that she stopped doing any activities that caused overexertion.  She described vertigo dizziness sensation.  She underwent cardiac stress PET/CT scan.  Test was considered abnormal and findings were consistent with RCA ischemia with decrease in RCA stress flows, study was found to be intermediate risk due to decrease in blood flow reserve.  See full report below.  Stress EF 70%.  Echo showed normal LVEF, no segment valvular disease.  She wore a cardiac monitor also that showed predominantly normal sinus rhythm with average heart rate 84 bpm.  She had 6 pauses, longest lasting 3.9 seconds with evidence of first-degree AV block, 3 runs of SVT, brief in duration.  No malignant arrhythmias noted.  She is here for follow-up based on these abnormal stress test results.  She states she notes a recent episode of chest discomfort last night of what she describes as a gas bubble, was noted to along her right breast tissue and described as stabbing sensation. After occurring 4 times, it stopped and only lasted for maybe 2-3 minutes. Says it has not recurred and she did not try any medications during this time.  Does get short of breath with exertion that she wonders is related to possible  asthma. Denies any palpitations, syncope, presyncope, dizziness, orthopnea, PND, swelling or significant weight changes, acute bleeding, or claudication.  She is allergic to contrast dye.   Carotid duplex....    FH: Family history of heart failure.  Also does have positive family history of MI/PCI, stroke.  ROS: Negative. See HPI.   Studies Reviewed  EKG:      Nuclear medicine PET CT cardiac 11/04/2023:   Findings are consistent with RCA ischemia with decrease in RCA stress flows. The study is intermediate risk due to decrease in blood flow reserve.   LV perfusion is abnormal. There is evidence of ischemia. There is no evidence of infarction. Defect 1: There is a small defect with moderate reduction in uptake present in the mid to basal inferior location(s) that is reversible. There is normal wall motion in the defect area. Consistent with ischemia.   Rest left ventricular function is normal. Rest EF: 64%. Stress left ventricular function is normal. Stress EF: 70%. End diastolic cavity size is normal. End systolic cavity size is normal.   Myocardial blood flow was computed to be 1.94ml/g/min at rest and 2.24ml/g/min at stress. Global myocardial blood flow reserve was 1.81 and was mildly abnormal.   Coronary calcium  was present on the attenuation correction CT images. Severe coronary calcifications were present. Coronary calcifications were present in the left anterior descending artery, left circumflex artery and right coronary artery distribution(s).  Aortic atherosclerosis noted.   Electronically Signed  By: Stanly Leavens M.D.   Cardiac monitor 11/03/2023:  Patch wear time was for 13 days and 23 hours.   Normal sinus rhythm predominantly ranging from 49 to 138 bpm with an average HR of 84 bpm.   3 runs of nonsustained SVT occurred, fastest interval lasting 4 beats and longest interval lasting 5 beats.   6 pauses occurred, longest lasting 3.9 seconds. First-degree AV block  noted.   No evidence of atrial fibrillation/flutter, ventricular arrhythmias, high-grade AV block.   <1% PAC burden and <1% PVC burden.   No patient triggered events were noted.  Echo 10/21/2023: 1. Left ventricular ejection fraction, by estimation, is 60 to 65%. Left  ventricular ejection fraction by 3D volume is 61 %. The left ventricle has  normal function. The left ventricle has no regional wall motion  abnormalities. There is mild concentric  left ventricular hypertrophy. Left ventricular diastolic parameters are  consistent with Grade I diastolic dysfunction (impaired relaxation). The  average left ventricular global longitudinal strain is -17.9 %. The global  longitudinal strain is normal.   2. Right ventricular systolic function is normal. The right ventricular  size is normal. There is normal pulmonary artery systolic pressure. The  estimated right ventricular systolic pressure is 20.8 mmHg.   3. The mitral valve is degenerative. Trivial mitral valve regurgitation.   4. The aortic valve is tricuspid. Aortic valve regurgitation is not  visualized.   5. The inferior vena cava is normal in size with greater than 50%  respiratory variability, suggesting right atrial pressure of 3 mmHg.   Comparison(s): No prior Echocardiogram.  Physical Exam VS:  BP (!) 160/82   Pulse 92   Ht 5' 4 (1.626 m)   Wt 184 lb 12.8 oz (83.8 kg)   LMP 04/14/1982   SpO2 95%   BMI 31.72 kg/m        Wt Readings from Last 3 Encounters:  12/26/23 184 lb 12.8 oz (83.8 kg)  12/24/23 184 lb 6.4 oz (83.6 kg)  11/27/23 186 lb (84.4 kg)    GEN: Obese, 78 y.o. female in no acute distress NECK: No JVD; No carotid bruits CARDIAC: S1/S2, RRR, no murmurs, rubs, gallops RESPIRATORY:  Clear to auscultation without rales, wheezing or rhonchi  ABDOMEN: Soft, non-tender, non-distended EXTREMITIES:  No edema; No deformity   ASSESSMENT AND PLAN  Abnormal stress test, chest pain of uncertain etiology, dyspnea  on exertion, preop eval Admits to recent episode of chest pain, also admits to DOE.  Reviewed most recent nuclear medicine PET stress test that was abnormal and showed findings consistent with RCA ischemia with decrease in RCA stress flow.  Recommended by attending cardiologist to set patient up for left heart cath.  I reviewed risk versus benefits with her and she verbalized understanding is agreeable to proceed.  Ran case past Dr. Bettyjo in the office who recommended just a left heart cath for further evaluation as her recent Echo was benign. Will obtain CBC and BMET.  Will reach out to heart cath RN as she has allergy to contrast dye.  No medication changes at this time. Care and ED precautions discussed. Orders are under signed and held.      Informed Consent   Shared Decision Making/Informed Consent The risks [stroke (1 in 1000), death (1 in 1000), kidney failure [usually temporary] (1 in 500), bleeding (1 in 200), allergic reaction [possibly serious] (1 in 200)], benefits (diagnostic support and management of coronary artery disease) and alternatives of a cardiac catheterization were discussed in detail with Ms. Kerman and she is  willing to proceed.  2. T2DM Most recent A1c was 6.5%.  Continue follow-up with PCP who is managing this.  3. Obesity Weight loss via diet and exercise encouraged. Discussed the impact being overweight would have on cardiovascular risk.  Dispo: Follow-up with MD/APP in 4-6 weeks or sooner if anything changes.   Signed, Almarie Crate, NP   "

## 2023-12-30 ENCOUNTER — Ambulatory Visit: Payer: Self-pay | Admitting: Family Medicine

## 2023-12-30 ENCOUNTER — Encounter: Payer: Self-pay | Admitting: Family Medicine

## 2023-12-30 ENCOUNTER — Other Ambulatory Visit (HOSPITAL_BASED_OUTPATIENT_CLINIC_OR_DEPARTMENT_OTHER): Payer: Self-pay

## 2023-12-30 VITALS — BP 122/70 | HR 105 | Temp 98.0°F | Ht 64.0 in | Wt 180.0 lb

## 2023-12-30 DIAGNOSIS — I6522 Occlusion and stenosis of left carotid artery: Secondary | ICD-10-CM | POA: Diagnosis not present

## 2023-12-30 DIAGNOSIS — E785 Hyperlipidemia, unspecified: Secondary | ICD-10-CM | POA: Diagnosis not present

## 2023-12-30 DIAGNOSIS — R300 Dysuria: Secondary | ICD-10-CM

## 2023-12-30 DIAGNOSIS — I10 Essential (primary) hypertension: Secondary | ICD-10-CM

## 2023-12-30 DIAGNOSIS — R350 Frequency of micturition: Secondary | ICD-10-CM | POA: Diagnosis not present

## 2023-12-30 DIAGNOSIS — R6883 Chills (without fever): Secondary | ICD-10-CM

## 2023-12-30 DIAGNOSIS — E1169 Type 2 diabetes mellitus with other specified complication: Secondary | ICD-10-CM | POA: Diagnosis not present

## 2023-12-30 DIAGNOSIS — E119 Type 2 diabetes mellitus without complications: Secondary | ICD-10-CM

## 2023-12-30 LAB — URINALYSIS, COMPLETE
Bilirubin, UA: NEGATIVE
Glucose, UA: NEGATIVE
Ketones, UA: NEGATIVE
Nitrite, UA: NEGATIVE
RBC, UA: NEGATIVE
Specific Gravity, UA: 1.02 (ref 1.005–1.030)
Urobilinogen, Ur: 0.2 mg/dL (ref 0.2–1.0)
pH, UA: 6 (ref 5.0–7.5)

## 2023-12-30 LAB — MICROSCOPIC EXAMINATION
Epithelial Cells (non renal): NONE SEEN /HPF (ref 0–10)
RBC, Urine: NONE SEEN /HPF (ref 0–2)
Renal Epithel, UA: NONE SEEN /HPF
WBC, UA: >30 /HPF — AB (ref 0–5)
Yeast, UA: NONE SEEN

## 2023-12-30 LAB — VERITOR SARS-COV-2 AND FLU A+B
BD Veritor SARS-CoV-2 Ag: NEGATIVE
Influenza A: NEGATIVE
Influenza B: NEGATIVE

## 2023-12-30 LAB — BAYER DCA HB A1C WAIVED: HB A1C (BAYER DCA - WAIVED): 6.4 % — ABNORMAL HIGH (ref 4.8–5.6)

## 2023-12-30 MED ORDER — OSELTAMIVIR PHOSPHATE 75 MG PO CAPS
75.0000 mg | ORAL_CAPSULE | Freq: Every day | ORAL | 0 refills | Status: AC
  Filled 2023-12-30: qty 10, 10d supply, fill #0

## 2023-12-30 MED ORDER — CIPROFLOXACIN HCL 250 MG PO TABS
250.0000 mg | ORAL_TABLET | Freq: Two times a day (BID) | ORAL | 0 refills | Status: AC
  Filled 2023-12-30: qty 10, 5d supply, fill #0

## 2023-12-30 NOTE — Progress Notes (Signed)
 "  Subjective:  Patient ID: Rita Payne, female    DOB: 1945/12/17  Age: 78 y.o. MRN: [id number]  CC: Hypertension (Bp has been very elevated. 204/94. Getting headaches and blurred vison with the elevation. Just saw cardiology and placed on metoprolol . Only had one dose so far. ), Blood Sugar Problem (BS between 130-160 in the mornings. ), and Generalized Body Aches (Started last night. Body aches all over. A little better today with tylenol . Just doesn't feel good in general. )   HPI  Discussed the use of AI scribe software for clinical note transcription with the patient, who gave verbal consent to proceed.  History of Present Illness Rita Payne is a 78 year old female who presents for evaluation of potential flu exposure and follow-up on carotid artery findings.  She is concerned about potential flu exposure as her daughter and granddaughter were diagnosed with the flu four days ago. Her husband and son are on Tamiflu  and have not shown any symptoms. She is considering visiting them for Christmas.  A recent CT scan of her head performed at [facility] due to facial swelling revealed calcifications in her left carotid artery. She is scheduled for a carotid Doppler test in January to further evaluate the condition. The CT scan was done around November 24, 2023.  She was recently prescribed metoprolol  by her cardiologist, which she started taking yesterday due to pharmacy availability issues. She also mentions an A1c of 6.4.  She also mentions a history of mild arthritis in the TMJs.          12/30/2023    8:51 AM 10/01/2023    8:01 AM 07/04/2023   11:29 AM  Depression screen PHQ 2/9  Decreased Interest 0 0 0  Down, Depressed, Hopeless 0 0 0  PHQ - 2 Score 0 0 0  Altered sleeping 0  0  Tired, decreased energy 0  0  Change in appetite 0  0  Feeling bad or failure about yourself  0  0  Trouble concentrating 0  0  Moving slowly or fidgety/restless 0  0  Suicidal  thoughts 0  0  PHQ-9 Score 0  0   Difficult doing work/chores Not difficult at all  Not difficult at all     Data saved with a previous flowsheet row definition    History Julina has a past medical history of Allergy, Arthritis, Basal cell carcinoma, Cataract, Chronic kidney disease, Diarrhea, First degree heart block, GERD (gastroesophageal reflux disease), Hyperlipidemia, Hypertension, Leg fracture, Melanoma (HCC), Mild intermittent asthma, Neuromuscular disorder (HCC), Pneumonia, SUI (stress urinary incontinence, female), and Type 2 diabetes mellitus (HCC).   She has a past surgical history that includes Tonsillectomy; Abdominal hysterectomy (1987); Cholecystectomy (1989); Knee arthroscopy (Bilateral); Hernia repair; kidney tumor removal  (1986); Blepharoptosis repair (Bilateral, 2009); Total hip arthroplasty (Right, 08/25/2012); Lumbar laminectomy/decompression microdiscectomy (N/A, 12/08/2013); Pubovaginal sling (N/A, 10/03/2014); Replacement total hip w/  resurfacing implants (Right); Colonoscopy; Polypectomy; Total knee arthroplasty (Left, 04/08/2022); Joint replacement; and LEFT HEART CATH AND CORONARY ANGIOGRAPHY (N/A, 11/21/2023).   Her family history includes Alcohol abuse in her brother and father; Arrhythmia in her brother; Arthritis in her brother, brother, father, mother, sister, and sister; Arthritis/Rheumatoid in her brother and sister; Asthma in her sister; Cancer in her sister; Colon polyps in her sister and sister; Depression in her brother, sister, and sister; Diabetes in her mother, paternal grandmother, sister, and sister; Drug abuse in her brother; Early death in her brother, brother, and sister;  Heart attack in her brother and mother; Heart disease in her brother, brother, father, mother, and sister; Heart failure in her brother and sister; Hyperlipidemia in her sister; Hypertension in her brother, brother, father, mother, sister, sister, and sister; Kidney disease in her sister  and sister; Miscarriages / Stillbirths in her daughter, mother, and sister; Obesity in her sister and sister; Stroke in her father, mother, and sister; Varicose Veins in her daughter; Vision loss in her brother.She reports that she quit smoking about 21 months ago. Her smoking use included cigarettes. She started smoking about 34 years ago. She has a 19.3 pack-year smoking history. She has never used smokeless tobacco. She reports that she does not drink alcohol and does not use drugs.    ROS Review of Systems  Constitutional: Negative.   HENT:  Negative for congestion.   Eyes:  Negative for visual disturbance.  Respiratory:  Negative for shortness of breath.   Cardiovascular:  Negative for chest pain.  Gastrointestinal:  Negative for abdominal pain, constipation, diarrhea, nausea and vomiting.  Genitourinary:  Negative for difficulty urinating.  Musculoskeletal:  Negative for arthralgias and myalgias.  Neurological:  Negative for headaches.  Psychiatric/Behavioral:  Negative for sleep disturbance.     Objective:  BP 122/70   Pulse (!) 105   Temp 98 F (36.7 C)   Ht 5' 4 (1.626 m)   Wt 180 lb (81.6 kg)   LMP 04/14/1982   SpO2 93%   BMI 30.90 kg/m   BP Readings from Last 3 Encounters:  12/30/23 122/70  12/26/23 (!) 150/80  12/24/23 (!) 178/79    Wt Readings from Last 3 Encounters:  12/30/23 180 lb (81.6 kg)  12/26/23 184 lb 12.8 oz (83.8 kg)  12/24/23 184 lb 6.4 oz (83.6 kg)     Physical Exam Physical Exam GENERAL: Alert, cooperative, well developed, no acute distress HEENT: Normocephalic, normal oropharynx, moist mucous membranes CHEST: Clear to auscultation bilaterally, No wheezes, rhonchi, or crackles CARDIOVASCULAR: Normal heart rate and rhythm, S1 and S2 normal without murmurs ABDOMEN: Soft, non-tender, non-distended, without organomegaly, Normal bowel sounds EXTREMITIES: No cyanosis or edema NEUROLOGICAL: Cranial nerves grossly intact, Moves all extremities  without gross motor or sensory deficit   Assessment & Plan:  Type 2 diabetes mellitus without complication, without long-term current use of insulin  (HCC) -     Bayer DCA Hb A1c Waived -     Urine Culture -     Urinalysis, Complete  Hyperlipidemia associated with type 2 diabetes mellitus (HCC) -     Bayer DCA Hb A1c Waived -     Urine Culture -     Urinalysis, Complete  Essential hypertension -     Urinalysis -     Urine Culture -     Urinalysis, Complete  Urine frequency -     Urinalysis -     Urine Culture -     Urinalysis, Complete  Chills -     Veritor SARS-CoV-2 and Flu A+B -     Urinalysis -     Urine Culture -     Urinalysis, Complete  Dysuria -     Urinalysis -     Urine Culture -     Urinalysis, Complete  Stenosis of left carotid artery -     Urine Culture -     Urinalysis, Complete  Other orders -     Oseltamivir  Phosphate; Take 1 capsule (75 mg total) by mouth daily. to prevent flu  Dispense: 10 capsule;  Refill: 0 -     Microscopic Examination    Assessment and Plan Assessment & Plan Influenza prophylaxis   Family members, including her daughter and granddaughter, have been diagnosed with influenza, posing a potential exposure risk. Prophylactic treatment is considered due to upcoming family gatherings. Prescribe Tamiflu  once daily as a preventive measure. Advise avoiding close contact with infected family members if possible.  Type 2 diabetes mellitus   A1c is 6.4, indicating good control.  Stenosis of left carotid artery   Calcifications are noted in the left carotid artery on CT scan. Further evaluation with a carotid Doppler is scheduled to assess severity. Proceed with the scheduled carotid Doppler test on January 12th or 14th.  Urinary frequency   She reports urinary frequency, possibly related to a urinary tract infection or other causes.       Follow-up: Return in about 3 months (around 03/29/2024).  Butler Der, M.D. "

## 2023-12-31 ENCOUNTER — Telehealth: Payer: Self-pay

## 2023-12-31 NOTE — Telephone Encounter (Signed)
 Copied from CRM #[id number]. Topic: Clinical - Lab/Test Results >> Dec 31, 2023 10:18 AM Terri MATSU wrote: Reason for CRM: Patient returning call. Advised her on the note that was left but she had follow up questions about which medication did Dr.Stacks sent because she picked up something yesterday. PER cal nurse will call back

## 2023-12-31 NOTE — Telephone Encounter (Signed)
 Notified patient on voicemail. LS

## 2024-01-02 LAB — URINE CULTURE

## 2024-01-06 ENCOUNTER — Ambulatory Visit: Admitting: Family Medicine

## 2024-01-12 ENCOUNTER — Ambulatory Visit: Admitting: Internal Medicine

## 2024-01-19 ENCOUNTER — Ambulatory Visit: Attending: Nurse Practitioner

## 2024-01-19 DIAGNOSIS — I6522 Occlusion and stenosis of left carotid artery: Secondary | ICD-10-CM | POA: Insufficient documentation

## 2024-01-19 DIAGNOSIS — H538 Other visual disturbances: Secondary | ICD-10-CM | POA: Insufficient documentation

## 2024-01-26 ENCOUNTER — Ambulatory Visit: Payer: Self-pay | Admitting: Nurse Practitioner

## 2024-01-27 ENCOUNTER — Telehealth: Payer: Self-pay | Admitting: Nurse Practitioner

## 2024-01-27 ENCOUNTER — Other Ambulatory Visit (HOSPITAL_BASED_OUTPATIENT_CLINIC_OR_DEPARTMENT_OTHER): Payer: Self-pay

## 2024-01-27 NOTE — Telephone Encounter (Signed)
 Result Note Mild blockages noted bilaterally.  This is not worrisome and we will continue to monitor this.   Rita Payne, AGNP-C VAS US  CAROTID  Results discussed with patient.  She is reporting that the recent start of toprol  xl has not made any impact in her heart rates. She says they are all over the place and had only a day or two with a lower reading. She was at work and does not have her documentation.  Of note: she has been taking toprol  12.5 bid (not not daily ) since the beginning    Please advise

## 2024-01-27 NOTE — Telephone Encounter (Signed)
 Pt returning call regarding recent results, please advise

## 2024-01-27 NOTE — Telephone Encounter (Signed)
 Per patient, ok to leave provider response on home phone.

## 2024-02-20 ENCOUNTER — Ambulatory Visit: Admitting: Nurse Practitioner

## 2024-04-01 ENCOUNTER — Ambulatory Visit: Admitting: Family Medicine

## 2024-07-06 ENCOUNTER — Ambulatory Visit: Payer: Self-pay

## 2024-12-20 ENCOUNTER — Ambulatory Visit: Admitting: Adult Health
